# Patient Record
Sex: Male | Born: 1940 | Race: White | Hispanic: No | Smoking: Never smoker
Health system: Southern US, Community
[De-identification: ages and names within clinical notes are randomized; demographics above are authoritative.]

## PROBLEM LIST (undated history)

## (undated) DIAGNOSIS — H5440 Blindness, one eye, unspecified eye: Secondary | ICD-10-CM

## (undated) DIAGNOSIS — G459 Transient cerebral ischemic attack, unspecified: Secondary | ICD-10-CM

## (undated) DIAGNOSIS — I2510 Atherosclerotic heart disease of native coronary artery without angina pectoris: Secondary | ICD-10-CM

## (undated) DIAGNOSIS — N183 Chronic kidney disease, stage 3 unspecified: Secondary | ICD-10-CM

## (undated) DIAGNOSIS — E039 Hypothyroidism, unspecified: Secondary | ICD-10-CM

## (undated) DIAGNOSIS — K449 Diaphragmatic hernia without obstruction or gangrene: Secondary | ICD-10-CM

## (undated) DIAGNOSIS — M1990 Unspecified osteoarthritis, unspecified site: Secondary | ICD-10-CM

## (undated) DIAGNOSIS — K7460 Unspecified cirrhosis of liver: Secondary | ICD-10-CM

## (undated) DIAGNOSIS — I10 Essential (primary) hypertension: Secondary | ICD-10-CM

## (undated) DIAGNOSIS — F1020 Alcohol dependence, uncomplicated: Secondary | ICD-10-CM

## (undated) DIAGNOSIS — I81 Portal vein thrombosis: Secondary | ICD-10-CM

## (undated) DIAGNOSIS — D126 Benign neoplasm of colon, unspecified: Secondary | ICD-10-CM

## (undated) DIAGNOSIS — I4891 Unspecified atrial fibrillation: Secondary | ICD-10-CM

## (undated) HISTORY — DX: Chronic kidney disease, stage 3 unspecified: N18.30

## (undated) HISTORY — DX: Benign neoplasm of colon, unspecified: D12.6

## (undated) HISTORY — DX: Atherosclerotic heart disease of native coronary artery without angina pectoris: I25.10

## (undated) HISTORY — DX: Unspecified atrial fibrillation: I48.91

## (undated) HISTORY — DX: Chronic kidney disease, stage 3 (moderate): N18.3

## (undated) HISTORY — DX: Unspecified osteoarthritis, unspecified site: M19.90

## (undated) HISTORY — PX: EYE SURGERY: SHX253

## (undated) HISTORY — DX: Blindness, one eye, unspecified eye: H54.40

## (undated) HISTORY — PX: TOTAL KNEE ARTHROPLASTY: SHX125

## (undated) HISTORY — DX: Essential (primary) hypertension: I10

## (undated) HISTORY — PX: TOTAL HIP ARTHROPLASTY: SHX124

## (undated) HISTORY — PX: JOINT REPLACEMENT: SHX530

---

## 1995-11-20 DIAGNOSIS — H5440 Blindness, one eye, unspecified eye: Secondary | ICD-10-CM

## 1995-11-20 HISTORY — DX: Blindness, one eye, unspecified eye: H54.40

## 1995-11-20 HISTORY — PX: OTHER SURGICAL HISTORY: SHX169

## 1999-06-09 ENCOUNTER — Encounter: Payer: Self-pay | Admitting: Orthopedic Surgery

## 1999-06-09 ENCOUNTER — Ambulatory Visit (HOSPITAL_COMMUNITY): Admission: RE | Admit: 1999-06-09 | Discharge: 1999-06-09 | Payer: Self-pay | Admitting: Orthopedic Surgery

## 1999-10-20 ENCOUNTER — Ambulatory Visit (HOSPITAL_COMMUNITY): Admission: RE | Admit: 1999-10-20 | Discharge: 1999-10-20 | Payer: Self-pay | Admitting: Orthopedic Surgery

## 1999-10-20 ENCOUNTER — Encounter: Payer: Self-pay | Admitting: Orthopedic Surgery

## 1999-11-01 ENCOUNTER — Ambulatory Visit (HOSPITAL_COMMUNITY): Admission: RE | Admit: 1999-11-01 | Discharge: 1999-11-01 | Payer: Self-pay | Admitting: Unknown Physician Specialty

## 2000-11-19 HISTORY — PX: TOTAL HIP ARTHROPLASTY: SHX124

## 2001-11-19 HISTORY — PX: CARDIAC CATHETERIZATION: SHX172

## 2002-08-11 ENCOUNTER — Inpatient Hospital Stay (HOSPITAL_COMMUNITY): Admission: EM | Admit: 2002-08-11 | Discharge: 2002-08-14 | Payer: Self-pay | Admitting: Cardiology

## 2002-11-19 HISTORY — PX: COLONOSCOPY: SHX174

## 2003-04-22 ENCOUNTER — Ambulatory Visit (HOSPITAL_COMMUNITY): Admission: RE | Admit: 2003-04-22 | Discharge: 2003-04-22 | Payer: Self-pay | Admitting: Internal Medicine

## 2008-08-19 HISTORY — PX: COLONOSCOPY: SHX174

## 2008-08-26 ENCOUNTER — Ambulatory Visit (HOSPITAL_COMMUNITY): Admission: RE | Admit: 2008-08-26 | Discharge: 2008-08-26 | Payer: Self-pay | Admitting: Internal Medicine

## 2008-08-26 ENCOUNTER — Ambulatory Visit: Payer: Self-pay | Admitting: Internal Medicine

## 2011-04-03 NOTE — Op Note (Signed)
NAME:  STEVENS, MAGWOOD NO.:  [id number]   MEDICAL RECORD NO.:  [id number]          PATIENT TYPE:  AMB   LOCATION:  DAY                           FACILITY:  [facility]   PHYSICIAN:  R. Garfield Cornea, M.D. DATE OF BIRTH:  05-May-1941   DATE OF PROCEDURE:  08/26/2008  DATE OF DISCHARGE:                               OPERATIVE REPORT   INDICATIONS FOR PROCEDURE:  A 70 year old gentleman with history of  colonic polyps.  Last colonoscopy was in 2004.  He had adenoma at that  time.  He is devoid of any lower GI tract symptoms.  He is here for  surveillance.  Risks, benefits, alternatives, and limitations have been  reviewed, questions answered, and is agreeable.  Please see  documentation of medical record.   PROCEDURE NOTE:  O2 saturation, blood pressure, pulse, respirations  monitored throughout the entire procedure.   CONSCIOUS SEDATION:  Versed 5 mg IV, Demerol 100 mg IV in divided doses.   INSTRUMENT:  Pentax video chip system.   FINDINGS:  Digital rectal exam revealed no abnormalities.  Endoscopic  findings, the prep was good.  Colon:  Colonic mucosa was surveyed from  the rectosigmoid junction through the left transverse to right colon to  the appendiceal orifice, ileocecal valve, and cecum.  From this level,  scope was slowly withdrawn.  All previous mentioned mucosal surfaces  were again seen.  The colonic mucosa appeared normal.  Scope was pulled  out of rectum.  Thorough examination of the rectal mucosa including  retroflexion view of the anal verge demonstrated no abnormalities.  The  patient tolerated the procedure well and reactive at endoscopy.   IMPRESSION:  Normal rectum, normal colon.   RECOMMENDATIONS:  Repeat colonoscopy in 5-7 years.      Bridgette Habermann, M.D.  Electronically Signed     RMR/MEDQ  D:  08/26/2008  T:  08/26/2008  Job:  [id number]   cc:   Dr. Manuella Ghazi

## 2011-04-06 NOTE — Op Note (Signed)
NAME:  Justin Parsons, Justin Parsons                    ACCOUNT NO.:  [id number]   MEDICAL RECORD NO.:  [id number]                   PATIENT TYPE:  AMB   LOCATION:  DAY                                  FACILITY:  [facility]   PHYSICIAN:  R. Garfield Cornea, M.D.              DATE OF BIRTH:  Nov 04, 1941   DATE OF PROCEDURE:  04/22/2003  DATE OF DISCHARGE:                                 OPERATIVE REPORT   PROCEDURE:  Colonoscopy with snare polypectomy.   ENDOSCOPIST:  Cristopher Estimable. Rourk, M.D.   INDICATIONS FOR PROCEDURE:  The patient is a 70 year old gentlemen with  intermittent bright red blood per rectum which has been ongoing for years.  He has never had his lower GI tract evaluated.  Colonoscopy is now being  done. This approach has been discussed with the patient at length.  The  potential risks, benefits, and alternatives have been reviewed, questions  answered.  He is agreeable.  Please see my dictated consultation note for  more information (Apr 08, 2003).   PROCEDURE NOTE:  O2 saturation, blood pressure, pulse and respirations were  monitored throughout the entire procedure.  Conscious sedation: Versed 2  mg IV, Demerol 50 mg IV in divided doses.   INSTRUMENT:  Olympus video chip adult colonoscope.   FINDINGS:  Digital rectal exam revealed no abnormalities.   ENDOSCOPIC FINDINGS:  The prep was adequate.   RECTUM:  Examination of the rectal mucosa including the retroflex view of  the anal verge revealed a 1 cm sessile polyp seen at 6 cm from the anal  verge.  Please see photos.  The patient really did not have any hemorrhoids  or other abnormalities.   COLON:  The colonic mucosa was surveyed from the rectosigmoid junction  through the left transverse and right colon to the area of the appendiceal  orifice, ileocecal valve, and cecum.  These structures were well seen and  photographed for the record.  The colonic mucosa to the cecum appeared  normal.   From the level of the cecum and  ileocecal valve the scope was slowly and  cautiously withdrawn.  All previously mentioned mucosal surfaces were again  seen; and, again, no abnormalities were observed.  The polyp in the rectum  was resected with the snare, single pass, and was recovered.  The patient  tolerated the procedure well and was reacted in endoscopy.   IMPRESSION:  1. Sessile rectal polyp, as described above, resected with snare.  2. The remainder of the rectum and colon appeared normal.    RECOMMENDATIONS:  1. No aspirin or arthritis medications for 10 days.  2. Colace 100 mg orally b.i.d.  3. Metamucil or Citrucel daily.  4. Follow up on path.  5. Further recommendations to follow.  6. I suspect that the patient does, indeed, have a fissure.  I doubt that     the polyp in the rectum is responsible for his intermittent bright red  blood per rectum.                                               Bridgette Habermann, M.D.    RMR/MEDQ  D:  04/22/2003  T:  04/22/2003  Job:  [id number]   cc:   Jeanie Cooks  [address] [address]  Fax: [phone]

## 2011-04-06 NOTE — Discharge Summary (Signed)
NAME:  Justin Parsons, Justin Parsons                    ACCOUNT NO.:  [id number]   MEDICAL RECORD NO.:  [id number]                   PATIENT TYPE:  INP   LOCATION:  2005                                 FACILITY:  [facility]   PHYSICIAN:  Octavia Heir, M.D.             DATE OF BIRTH:  December 24, 1940   DATE OF ADMISSION:  08/11/2002  DATE OF DISCHARGE:  08/14/2002                                 DISCHARGE SUMMARY   DISCHARGE DIAGNOSES:  1. 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  2. Hypertension, controlled at discharge.  3. Arthritis.  4. Hyperlipidemia.   HISTORY OF PRESENT ILLNESS:  The patient is a 70 year old male who presented  to [facility] with chest pain. Enzymes came back positive with a CK  of 532 and MB of 69. He was transferred to [facility] with a  diagnosis of a non Q wave MI.  He has a history of a prior catheterization  in December 2000 that showed nonobstructive disease and an ejection fraction  of 50%. Cardiolite study done on December 2002 showed an EF of 51% with some  scar and mild peri-infarct ischemia.   HOSPITAL COURSE:  The patient was admitted to telemetry and started on IV  heparin. He was  catheterized on August 11, 2002, by Dr. Einar Gip. This  revealed an ectatic large RCA, 50% narrowing of the  first diagonal. The  circumflex was also large. There was no significant change from his previous  Cardiolite study, and his EF had improved from 50%  to 65%. The patient was  diagnosed with presumed spasm and possible thrombosis in the RCA. He was  started on a calcium blocker, a statin and low dose beta blocker. The  patient had been on no medications prior to admission; he had run out.   LABORATORY DATA:  Sodium 137, potassium 3.5, BUN 19, creatinine 1.5. White  count 10.3, hemoglobin 13.7,  hematocrit 40.4, platelets 136. Chemistry  showed normal liver functions except for an  elevated  AST of 74. CKs peaked  here at 452 with 48 MBs. A lipid profile showed a cholesterol of 198, LDL  127, HDL 45.   An EKG showed sinus rhythm with inferior Qs.   DISPOSITION:  The patient is discharged in stable condition.  He would like  to follow up with Dr. Mathis Bud in our [place] office, as he lives in  [place]. At discharge he noted that he had no money for medicines. It was  arranged for him to come to the office  to pick up two  weeks worth of  samples. This will need to be  addressed when he sees Dr. Mathis Bud  in  followup.     Erlene Quan, P.A.                      Octavia Heir,  M.D.    LKK/MEDQ  D:  08/14/2002  T:  08/17/2002  Job:  [id number]   cc:   Roque Lias, M.D.   Ninetta Lights, M.D.

## 2011-04-06 NOTE — Cardiovascular Report (Signed)
NAME:  Justin Parsons, Justin Parsons                    ACCOUNT NO.:  [id number]   MEDICAL RECORD NO.:  [id number]                   PATIENT TYPE:  INP   LOCATION:                                       FACILITY:  [facility]   PHYSICIAN:  Eden Lathe. Einar Gip, M.D.                  DATE OF BIRTH:  Mar 18, 1941   DATE OF PROCEDURE:  08/12/2002  DATE OF DISCHARGE:                              CARDIAC CATHETERIZATION   PATIENT LOCATION:  2900   PROCEDURES PERFORMED:  1. Left ventriculography.  2. Selective right and left coronary arteriography.  3. Closure of right femoral artery access with Perclose.   INDICATION:  The patient is a 70 year old gentleman with a history of  hypertension, history of insignificant coronary artery disease in the past  was admitted to the hospital with non-Q-wave myocardial infarction. A  cardiac catheterization is being performed to evaluate his coronary anatomy.   HEMODYNAMIC DATA:  The left ventricular pressures 129/-4 with an end-  diastolic pressure of 20 mmHg. The aortic pressure 121/65 with a mean of 95  mmHg.   ANGIOGRAPHIC DATA:   LEFT VENTRICULOGRAM:  The left ventricular systolic function was normal and  is estimated at 65%.  There is no wall motion abnormality.  There is no  significant mitral regurgitation.   Right coronary artery:  The right coronary artery is a large dominant  vessel.  The PDA supplies the whole inferior wall, posterior wall, and  majority of lateral wall and the apical wall.  The right coronary artery is  severely ectatic throughout. The PLV branch is a moderate caliber vessel and  proximally has about 30% stenosis.  Otherwise, no significant lesion is  noted and there is diffuse slow filling in the right coronary artery.   Left main coronary artery:  The left main coronary artery is a large caliber  vessel and is normal.   Left circumflex:  Left circumflex is again a very large caliber vessel and  is diffusely ectatic. It gives origin  to a very large obtuse marginal branch  #1, obtuse marginal #2 and the large obtuse marginal #3.  There is no  significant luminal narrowing.   Left anterior descending artery:  Left anterior descending artery is a large  caliber vessel proximally and it ends before reaching the apex.  The  proximal LAD has about 40-50% stenosis. It gives origin to a moderate sized  diagonal #1, which has a ostial and proximal 50% stenosis. Post stenosis,  there is a PCI noted in the diagonal #1 branch of the left anterior  descending artery.   IMPRESSION:  1. Severely ectatic right coronary artery, more than the circumflex coronary     artery. No significant luminal narrowing.  2. No significant change in coronary anatomy compared to November 01, 1999,     except ejection fraction is improved from 50% to the percent 65%.   RECOMMENDATIONS:  Based on  his coronary anatomy, continued aggressive risk  factor modification is indicated. The patient will be started on aspirin and  Plavix for prevention of clot formation given his severely ectatic coronary  arteries. Weight loss and calorie restriction is indicated.   TECHNIQUE OF PROCEDURE:  Under the usual sterile precautions, using 6 French  right femoral artery access, a 6 [place] multipurpose B2 catheter was  advanced into the ascending aorta over a 0.035 inch J wire.  The catheter  was gently advanced in the left ventricle and left ventricular pressures  were monitored. Hand contrast injection of the left ventricular was  performed, both in LAO and RAO projection. Then the catheter flushed and  pulled back into the ascending aorta and pressure gradient across gradient  across the aortic valve was monitored.  The right coronary artery was  selectively engaged and angiography was performed.  Then the catheter was  pulled out of the body and a 6 [place] Judkins 5 diagnostic catheter was  advanced into the ascending aorta over a 0.035 inch J wire. The left  main  coronary artery was selectively engaged. Angiography was performed. Then the  catheter was pulled out of the body in the usual fashion and changed to a 6  French angled pigtail catheter. The catheter was gently advanced in the left  ventricle and left ventriculography was performed in the RAO projection.  Then the catheter was flushed and pulled back into the ascending aorta.  Pressure gradient across the aortic valve was monitored.  Then the catheter  was pulled out of the body.  Right femoral angiography was performed through  the arterial access sheath and the access was closed with Perclose and  adequate hemostasis obtained.  The patient was transferred to recovery in a  stable condition. The patient tolerated the procedure well.                                                 Eden Lathe. Einar Gip, M.D.    Minna Antis  D:  08/12/2002  T:  08/14/2002  Job:  [id number]

## 2011-05-01 ENCOUNTER — Encounter: Payer: Self-pay | Admitting: Gastroenterology

## 2011-05-01 ENCOUNTER — Ambulatory Visit (INDEPENDENT_AMBULATORY_CARE_PROVIDER_SITE_OTHER): Payer: Medicare Other | Admitting: Gastroenterology

## 2011-05-01 DIAGNOSIS — D649 Anemia, unspecified: Secondary | ICD-10-CM

## 2011-05-01 DIAGNOSIS — K219 Gastro-esophageal reflux disease without esophagitis: Secondary | ICD-10-CM

## 2011-05-01 DIAGNOSIS — K625 Hemorrhage of anus and rectum: Secondary | ICD-10-CM | POA: Insufficient documentation

## 2011-05-01 DIAGNOSIS — N289 Disorder of kidney and ureter, unspecified: Secondary | ICD-10-CM

## 2011-05-01 LAB — CBC
Hemoglobin: 13.2 g/dL — AB (ref 13.5–17.5)
WBC: 10.1

## 2011-05-01 LAB — COMPREHENSIVE METABOLIC PANEL
Albumin: 7.4
BUN: 27 mg/dL — AB (ref 4–21)
Calcium: 9.2 mg/dL
Creat: 1.87
Glucose: 100 mg/dL
Iron Saturation: 18
Iron: 74
Potassium: 4.4 mmol/L
TIBC: 410
Total bilirubin, fluid: 20

## 2011-05-01 MED ORDER — OMEPRAZOLE 20 MG PO CPDR: 20.0000 mg | DELAYED_RELEASE_CAPSULE | Freq: Every day | ORAL | Status: DC

## 2011-05-01 NOTE — Assessment & Plan Note (Addendum)
Patient states he has a history of anemia. Occasional diarrhea blood per rectum on the toilet tissue. Colonoscopy is up-to-date. He has nocturnal heartburn symptoms when he eats too late but otherwise no significant GI symptoms. He is on chronic diclofenac. As discussed with him today will start him on omeprazole 20 mg daily. Will try to retrieve blood work done by Dr. Manuella Ghazi. Repeat labs today. ifobt. Further recommendations to follow.

## 2011-05-01 NOTE — Assessment & Plan Note (Signed)
Check Met-7.

## 2011-05-01 NOTE — Assessment & Plan Note (Signed)
No alarm symptoms. Start omeprazole.

## 2011-05-01 NOTE — Progress Notes (Signed)
Cc to PCP 

## 2011-05-01 NOTE — Progress Notes (Signed)
Primary Care Physician:  Monico Blitz, MD  Primary Gastroenterologist:  Garfield Cornea, MD  Chief Complaint  Patient presents with  . Rectal Bleeding  . Anemia  . Chronic Kidney Disease    HPI:  Justin Parsons is a 70 y.o. male here for further evaluation of anemia blood in stool. He made the appointment with these complaints but states he asked limited because he needed someone to assist him with finding out some answers. He states that in January he had revision of a prior hip replacement and at that time was told that he had abnormal kidney function, hyperkalemia, anemia. He states he has not been able to find any answers from Dr. Manuella Ghazi and wants to be considered for referral for his kidney function problems. We last saw him in 2009 at time of colonoscopy which was normal. He has a history of adenomatous polyps was recommended to come back for repeat exam in 5-7 years (2014-2016).  He complains of mouth stays dry. Not thirsty. Mouth sticks together after sleeping. When he found out he had kidney problems he was taken off of blood pressure by mouth and they wanted to take him off his diclofenac but he states he can't get out of bed without it.    Stools dark. H/O fissure, will notice brbpr on toilet tissue at times. BM regular. Takes iron, started since d/c from hospital in January 2012.  Nocturnal heartburn if eats late at night. Takes OTC antacids. No abd pain. No dysphagia to solid foods or pills.  Current Outpatient Prescriptions  Medication Sig Dispense Refill  . allopurinol (ZYLOPRIM) 100 MG tablet Take 100 mg by mouth daily.        Marland Kitchen amLODipine (NORVASC) 2.5 MG tablet Take 2.5 mg by mouth daily.        . diclofenac (VOLTAREN) 50 MG EC tablet Take 50 mg by mouth 3 (three) times daily as needed.        . iron polysaccharides (NIFEREX) 150 MG capsule Take 150 mg by mouth daily.       Marland Kitchen omeprazole (PRILOSEC) 20 MG capsule Take 1 capsule (20 mg total) by mouth daily.  30 capsule  5       Allergies as of 05/01/2011  . (No Known Allergies)    Past Medical History  Diagnosis Date  . HTN (hypertension)   . Arthritis   . 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 Adenomatous polyp of colon     Past Surgical History  Procedure Date  . Total hip arthroplasty 2002  . 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 Colonoscopy 08/2008    normal, repeat exam in 5-7 years  . Colonoscopy 2004    rectal adenomatous polyp  . 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 Colon cancer Neg Hx   . Ovarian cancer Sister     History   Social History  . Marital Status: Divorced    Spouse Name: N/A    Number of Children: N/A  . Years of Education: N/A   Occupational History  . Not on file.   Social History Main Topics  . Smoking status: Never Smoker   . Smokeless tobacco: Not on file   Comment: used to chew tobacco, none in 15 years  . Alcohol Use: Yes  half a gallon in a week but nothing in about a month or so  . Drug Use: No  . Sexually Active: Not on file   Other Topics Concern  . Not on file   Social History Narrative   One son deceased while in prison, drug addiction.      ROS:  General: Negative for anorexia, weight loss, fever, chills, fatigue, weakness. Eyes: Negative for vision changes.  ENT: Negative for hoarseness, difficulty swallowing , nasal congestion. CV: Negative for chest pain, angina, palpitations, dyspnea on exertion, peripheral edema.  Respiratory: Negative for dyspnea at rest, dyspnea on exertion, cough, sputum, wheezing.  GI: See history of present illness. GU:  Negative for dysuria, hematuria, urinary incontinence, urinary frequency, nocturnal urination.  MS: Negative for joint pain, low back pain.  Derm: Negative for rash or itching.  Neuro: Negative for weakness, abnormal sensation, seizure, frequent  headaches, memory loss, confusion.  Psych: Negative for anxiety, depression, suicidal ideation, hallucinations.  Endo: Negative for unusual weight change.  Heme: Negative for bruising or bleeding. Allergy: Negative for rash or hives.    Physical Examination:  BP 118/69  Pulse 61  Temp(Src) 97.3 F (36.3 C) (Temporal)  Ht 5' 9"  (1.753 m)  Wt 295 lb (133.811 kg)  BMI 43.56 kg/m2   General: Well-nourished, well-developed in no acute distress.  Head: Normocephalic, atraumatic.   Eyes: Conjunctiva pink, no icterus. Mouth: Oropharyngeal mucosa moist and pink , no lesions erythema or exudate. Neck: Supple without thyromegaly, masses, or lymphadenopathy.  Lungs: Clear to auscultation bilaterally.  Heart: Regular rate and rhythm, no murmurs rubs or gallops.  Abdomen: Bowel sounds are normal, obese, nontender, nondistended, no hepatosplenomegaly or masses, no abdominal bruits or    hernia , no rebound or guarding.   Extremities: No lower extremity edema.  Neuro: Alert and oriented x 4 , grossly normal neurologically.  Skin: Warm and dry, no rash or jaundice.   Psych: Alert and cooperative, normal mood and affect.

## 2011-05-02 LAB — CBC WITH DIFFERENTIAL/PLATELET
Basophils Absolute: 0 10*3/uL (ref 0.0–0.1)
Basophils Relative: 0 % (ref 0–1)
Eosinophils Absolute: 0.5 10*3/uL (ref 0.0–0.7)
Eosinophils Relative: 5 % (ref 0–5)
HCT: 42.7 % (ref 39.0–52.0)
Hemoglobin: 13.9 g/dL (ref 13.0–17.0)
Lymphocytes Relative: 18 % (ref 12–46)
Lymphs Abs: 1.7 10*3/uL (ref 0.7–4.0)
MCH: 30.8 pg (ref 26.0–34.0)
MCHC: 32.6 g/dL (ref 30.0–36.0)
MCV: 94.7 fL (ref 78.0–100.0)
Monocytes Absolute: 0.7 10*3/uL (ref 0.1–1.0)
Monocytes Relative: 7 % (ref 3–12)
Neutro Abs: 6.6 10*3/uL (ref 1.7–7.7)
Neutrophils Relative %: 70 % (ref 43–77)
Platelets: 161 10*3/uL (ref 150–400)
RBC: 4.51 MIL/uL (ref 4.22–5.81)
RDW: 15.2 % (ref 11.5–15.5)
WBC: 9.5 10*3/uL (ref 4.0–10.5)

## 2011-05-02 LAB — BASIC METABOLIC PANEL
BUN: 21 mg/dL (ref 6–23)
CO2: 26 mEq/L (ref 19–32)
Calcium: 9.2 mg/dL (ref 8.4–10.5)
Chloride: 106 mEq/L (ref 96–112)
Creat: 1.56 mg/dL — ABNORMAL HIGH (ref 0.50–1.35)
Glucose, Bld: 89 mg/dL (ref 70–99)
Potassium: 4.4 mEq/L (ref 3.5–5.3)
Sodium: 143 mEq/L (ref 135–145)

## 2011-05-02 LAB — IRON AND TIBC
%SAT: 33 % (ref 20–55)
Iron: 115 ug/dL (ref 42–165)
TIBC: 347 ug/dL (ref 215–435)
UIBC: 232 ug/dL

## 2011-05-02 LAB — FERRITIN: Ferritin: 59 ng/mL (ref 22–322)

## 2011-05-10 ENCOUNTER — Telehealth: Payer: Self-pay

## 2011-05-10 NOTE — Telephone Encounter (Signed)
Pt called- I had sent him a letter about his labs. Went over lab work with him and he stated he would try to get ifobt back to [place] next week. 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

## 2011-05-21 ENCOUNTER — Ambulatory Visit: Payer: Medicare Other | Admitting: Gastroenterology

## 2011-05-21 DIAGNOSIS — D649 Anemia, unspecified: Secondary | ICD-10-CM

## 2011-05-24 ENCOUNTER — Other Ambulatory Visit: Payer: Self-pay | Admitting: Gastroenterology

## 2011-05-24 DIAGNOSIS — D649 Anemia, unspecified: Secondary | ICD-10-CM

## 2011-05-24 NOTE — Telephone Encounter (Signed)
Pt returned ifobt and it was negative. Pt aware

## 2011-10-04 ENCOUNTER — Other Ambulatory Visit: Payer: Self-pay | Admitting: Gastroenterology

## 2011-10-05 LAB — CBC WITH DIFFERENTIAL/PLATELET
Basophils Relative: 0 % (ref 0–1)
Eosinophils Absolute: 0.4 10*3/uL (ref 0.0–0.7)
HCT: 48.1 % (ref 39.0–52.0)
Hemoglobin: 16 g/dL (ref 13.0–17.0)
Lymphs Abs: 1.4 10*3/uL (ref 0.7–4.0)
MCH: 32.5 pg (ref 26.0–34.0)
MCHC: 33.3 g/dL (ref 30.0–36.0)
MCV: 97.6 fL (ref 78.0–100.0)
Monocytes Absolute: 0.8 10*3/uL (ref 0.1–1.0)
Monocytes Relative: 7 % (ref 3–12)
Neutrophils Relative %: 78 % — ABNORMAL HIGH (ref 43–77)
RBC: 4.93 MIL/uL (ref 4.22–5.81)

## 2011-10-05 NOTE — Progress Notes (Signed)
Quick Note:  H/H normal. Platelet down some, wbc count up some.  Please see how patient is doing. Any fever, congestion, sob, abd pain, bowel issues, urinary burning. Repeat CBC in four weeks.   ______

## 2011-12-19 ENCOUNTER — Other Ambulatory Visit: Payer: Self-pay | Admitting: Gastroenterology

## 2011-12-19 DIAGNOSIS — D649 Anemia, unspecified: Secondary | ICD-10-CM

## 2011-12-24 ENCOUNTER — Other Ambulatory Visit: Payer: Self-pay | Admitting: Gastroenterology

## 2011-12-25 LAB — CBC WITH DIFFERENTIAL/PLATELET
Eosinophils Absolute: 0.4 10*3/uL (ref 0.0–0.7)
Eosinophils Relative: 4 % (ref 0–5)
HCT: 46.2 % (ref 39.0–52.0)
Lymphocytes Relative: 16 % (ref 12–46)
Lymphs Abs: 1.6 10*3/uL (ref 0.7–4.0)
MCH: 32.1 pg (ref 26.0–34.0)
MCV: 97.7 fL (ref 78.0–100.0)
Monocytes Absolute: 0.7 10*3/uL (ref 0.1–1.0)
Platelets: 141 10*3/uL — ABNORMAL LOW (ref 150–400)
RDW: 14 % (ref 11.5–15.5)
WBC: 10 10*3/uL (ref 4.0–10.5)

## 2011-12-27 NOTE — Progress Notes (Signed)
Quick Note:  Looks good except mild thrombocytopenia. Limit etoh consumption, preferably none. Consider abd u/s to look for cirrhosis/splenomegaly if patient agreeable. ______

## 2012-01-02 NOTE — Progress Notes (Signed)
Quick Note:  Tried to call pt- LMOM ______

## 2012-01-07 NOTE — Progress Notes (Signed)
Quick Note:  Tried to call pt- LMOM ______

## 2012-01-09 NOTE — Progress Notes (Signed)
Quick Note:  Mailed letter to pt ______

## 2012-01-14 ENCOUNTER — Other Ambulatory Visit: Payer: Self-pay | Admitting: General Practice

## 2012-01-14 DIAGNOSIS — F1010 Alcohol abuse, uncomplicated: Secondary | ICD-10-CM

## 2012-01-14 NOTE — Progress Notes (Signed)
Pt is scheduled for abd u/s on 01/21/12@8 :00am.  Appt is given to [phone]).

## 2012-01-18 DIAGNOSIS — I81 Portal vein thrombosis: Secondary | ICD-10-CM

## 2012-01-18 HISTORY — DX: Portal vein thrombosis: I81

## 2012-01-21 ENCOUNTER — Other Ambulatory Visit (HOSPITAL_COMMUNITY): Payer: PRIVATE HEALTH INSURANCE

## 2012-01-21 ENCOUNTER — Ambulatory Visit (HOSPITAL_COMMUNITY)
Admission: RE | Admit: 2012-01-21 | Discharge: 2012-01-21 | Disposition: A | Discharge status: Home / Self Care | Payer: Medicare Other | Source: Ambulatory Visit | Attending: Gastroenterology | Admitting: Gastroenterology

## 2012-01-21 DIAGNOSIS — K838 Other specified diseases of biliary tract: Secondary | ICD-10-CM | POA: Insufficient documentation

## 2012-01-21 DIAGNOSIS — K769 Liver disease, unspecified: Secondary | ICD-10-CM | POA: Insufficient documentation

## 2012-01-21 DIAGNOSIS — F1010 Alcohol abuse, uncomplicated: Secondary | ICD-10-CM

## 2012-01-22 NOTE — Progress Notes (Signed)
Results faxed to PCP

## 2012-01-22 NOTE — Progress Notes (Signed)
Quick Note:  Patient needs OV this week to discuss u/s results. 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  To discuss with Dr. Gala Romney. ______

## 2012-01-24 NOTE — Progress Notes (Signed)
Results Cc to PCP  

## 2012-01-24 NOTE — Progress Notes (Signed)
Quick Note:  Spoke with Dr. Gala Romney regarding u/s. No need for anticoagulation for portal vein thrombosis in cirrhotics.  Need OV to discuss findings, further evaluate cause of cirrhosis,baseline AFP, consider EGD for EV, consider CT abd/pelvis with contrast for further detailing of liver and portal vein thrombosis.  Will forward to Vicente Males to make aware since appt on Monday with her. ______

## 2012-01-24 NOTE — Progress Notes (Signed)
Quick Note:  When is his OV? ______

## 2012-01-28 ENCOUNTER — Encounter: Payer: Self-pay | Admitting: Gastroenterology

## 2012-01-28 ENCOUNTER — Ambulatory Visit (INDEPENDENT_AMBULATORY_CARE_PROVIDER_SITE_OTHER): Payer: Medicare Other | Admitting: Gastroenterology

## 2012-01-28 VITALS — BP 108/67 | HR 59 | Temp 98.0°F | Ht 69.0 in | Wt 305.6 lb

## 2012-01-28 DIAGNOSIS — K7460 Unspecified cirrhosis of liver: Secondary | ICD-10-CM

## 2012-01-28 DIAGNOSIS — K219 Gastro-esophageal reflux disease without esophagitis: Secondary | ICD-10-CM

## 2012-01-28 DIAGNOSIS — D369 Benign neoplasm, unspecified site: Secondary | ICD-10-CM

## 2012-01-28 NOTE — Progress Notes (Signed)
Referring Provider: Monico Blitz, MD Primary Care Physician:  Monico Blitz, MD, MD Primary Gastroenterologist: Dr. Gala Romney   Chief Complaint  Patient presents with  . Results    HPI:   Mr. Houseman presents today in follow-up for new findings of cirrhosis. He was last seen in June 2012 in our office. Since then, serial CBCs have shown consistent mild thrombocytopenia. [place] of abdomen ordered to assess for possible cirrhosis. Interestingly, [place] in Feb 2013 showed cirrhosis, portal vein thrombosis, gallbladder sludge, no evidence of acute cholecystitis, atrophic right kidney. 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 He will likely need further imaging to evaluate these findings more extensively. An MRI was proposed by radiologist; however, I believe CT may be best due to hx of eye transplant, knee replacement, etc.  Pt presents today for review of results. He was unaware of findings and concerned he was going to die. Discussed findings with him. He is relieved. Does note chronic hx of ETOH, specifically moonshine. He has not had any to drink since the [place] , as he states the tech scared him. Denies any tattoos, IV drug abuse, did have blood transfusion but in 1997. Denies jaundice, pruritis, wt loss, lack of appetite, dysphagia. No mental status changes or confusion. Reflux significantly improved since alcohol cessation.   Past Medical History  Diagnosis Date  . HTN (hypertension)   . Arthritis   . 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 Adenomatous polyp of colon     Past Surgical History  Procedure Date  . Total hip arthroplasty 2002  . 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 Colonoscopy 08/2008    normal, repeat exam in 5-7 years  . Colonoscopy 2004    rectal adenomatous polyp  . Total hip arthroplasty 2006/2012    revision in 2012    Current Outpatient Prescriptions    Medication Sig Dispense Refill  . allopurinol (ZYLOPRIM) 100 MG tablet Take 100 mg by mouth daily.        Marland Kitchen amLODipine (NORVASC) 2.5 MG tablet Take 2.5 mg by mouth daily.        . diclofenac (VOLTAREN) 50 MG EC tablet Take 50 mg by mouth 3 (three) times daily as needed.        . iron polysaccharides (NIFEREX) 150 MG capsule Take 150 mg by mouth daily.       Marland Kitchen omeprazole (PRILOSEC) 20 MG capsule Take 1 capsule (20 mg total) by mouth daily.  30 capsule  5    Allergies as of 01/28/2012  . (No Known Allergies)    Family History  Problem Relation Age of Onset  . Colon cancer Neg Hx   . Ovarian cancer Sister     History   Social History  . Marital Status: Divorced    Spouse Name: N/A    Number of Children: N/A  . Years of Education: N/A   Social History Main Topics  . Smoking status: Never Smoker   . Smokeless tobacco: None   Comment: used to chew tobacco, none in 15 years  . Alcohol Use: Yes     half a gallon in a week but nothing in about a month or so  . Drug Use: No  . Sexually Active: None   Other Topics Concern  . None   Social History Narrative   One son deceased while in prison, drug addiction.  Review of Systems: Gen: Denies fever, chills, anorexia. Denies fatigue, weakness, weight loss.  CV: Denies chest pain, palpitations, syncope, peripheral edema, and claudication. Resp: Denies dyspnea at rest, cough, wheezing, coughing up blood, and pleurisy. GI: Denies vomiting blood, jaundice, and fecal incontinence.   Denies dysphagia or odynophagia. Derm: Denies rash, itching, dry skin Psych: Denies depression, anxiety, memory loss, confusion. No homicidal or suicidal ideation.  Heme: Denies bruising, bleeding, and enlarged lymph nodes.  Physical Exam: BP 108/67  Pulse 59  Temp(Src) 98 F (36.7 C) (Temporal)  Ht 5' 9"  (1.753 m)  Wt 305 lb 9.6 oz (138.619 kg)  BMI 45.13 kg/m2 General:   Alert and oriented. No distress noted. Pleasant and cooperative.  Head:   Normocephalic and atraumatic. Eyes:  Conjuctiva clear without scleral icterus, right eye prosthesis Mouth:  Oral mucosa pink and moist.  Neck:  Supple, without mass or thyromegaly. Heart:  S1, S2 present without murmurs, rubs, or gallops. Regular rate and rhythm. Abdomen:  +BS, soft, largely obese, difficult to appreciate HSM. non-tender and non-distended. No rebound or guarding. Msk:  Symmetrical without gross deformities. Normal posture. Extremities:  Without edema. Neurologic:  Alert and  oriented x4;  grossly normal neurologically. Skin:  Intact without significant lesions or rashes. Cervical Nodes:  No significant cervical adenopathy. Psych:  Alert and cooperative. Normal mood and affect.

## 2012-01-28 NOTE — Patient Instructions (Signed)
Please have blood work completed. We will be calling you with these results.  We have also set you up for an upper endoscopy with Dr. Gala Romney in the near future.   Review the low salt diet and the handout on cirrhosis. It is important that you follow the diet that we provide you to help protect your liver.   We will be in touch shortly with the next best test for you regarding further evaluation of your liver.   Cirrhosis Cirrhosis is a condition of scarring of the liver which is caused when the liver has tried repairing itself following damage. This damage may come from a previous infection such as one of the forms of hepatitis (usually hepatitis C), or the damage may come from being injured by toxins. The main toxin that causes this damage is alcohol. The scarring of the liver from use of alcohol is irreversible. That means the liver cannot return to normal even though alcohol is not used any more. The main danger of hepatitis C infection is that it may cause long-lasting (chronic) liver disease, and this also may lead to cirrhosis. This complication is progressive and irreversible. CAUSES   Prior to available blood tests, hepatitis C could be contracted by blood transfusions. Since testing of blood has improved, this is now unlikely. This infection can also be contracted through intravenous drug use and the sharing of needles. It can also be contracted through sexual relationships. The injury caused by alcohol comes from too much use. It is not a few drinks that poison the liver, but years of misuse. Usually there will be some signs and symptoms early with scarring of the liver that suggest the development of better habits. Alcohol should never be used while using acetaminophen. A small dose of both taken together may cause irreversible damage to the liver. HOME CARE INSTRUCTIONS   There is no specific treatment for cirrhosis. However, there are things you can do to avoid making the condition  worse.  Rest as needed.   Eat a well-balanced diet. Your caregiver can help you with suggestions.   Vitamin supplements including vitamins A, K, D, and thiamine can help.   A low-salt diet, water restriction, or diuretic medicine may be needed to reduce fluid retention.   Avoid alcohol. This can be extremely toxic if combined with acetaminophen.   Avoid drugs which are toxic to the liver. Some of these include isoniazid, methyldopa, acetaminophen, anabolic steroids (muscle-building drugs), erythromycin, and oral contraceptives (birth control pills). Check with your caregiver to make sure medicines you are presently taking will not be harmful.   Periodic blood tests may be required. Follow your caregiver's advice regarding the timing of these.   Milk thistle is an herbal remedy which does protect the liver against toxins. However, it will not help once the liver has been scarred.  SEEK MEDICAL CARE IF:  You have increasing fatigue or weakness.   You develop swelling of the hands, feet, legs, or face.   You vomit bright red blood, or a coffee ground appearing material.   You have blood in your stools, or the stools turn black and tarry.   You have a fever.   You develop loss of appetite, or have nausea and vomiting.   You develop jaundice.   You develop easy bruising or bleeding.   You have worsening of any of the problems you are concerned about.  Document Released: 11/05/2005 Document Revised: 10/25/2011 Document Reviewed: 06/23/2008 [facility] Patient Information 2012 [place],  [employer].

## 2012-01-29 DIAGNOSIS — K7460 Unspecified cirrhosis of liver: Secondary | ICD-10-CM | POA: Insufficient documentation

## 2012-01-29 DIAGNOSIS — D369 Benign neoplasm, unspecified site: Secondary | ICD-10-CM | POA: Insufficient documentation

## 2012-01-29 LAB — COMPREHENSIVE METABOLIC PANEL
Alkaline Phosphatase: 65 U/L (ref 39–117)
CO2: 21 mEq/L (ref 19–32)
Creat: 1.87 mg/dL — ABNORMAL HIGH (ref 0.50–1.35)
Glucose, Bld: 94 mg/dL (ref 70–99)
Sodium: 141 mEq/L (ref 135–145)
Total Bilirubin: 0.5 mg/dL (ref 0.3–1.2)
Total Protein: 6.5 g/dL (ref 6.0–8.3)

## 2012-01-29 LAB — AFP TUMOR MARKER: AFP-Tumor Marker: 2.3 ng/mL (ref 0.0–8.0)

## 2012-01-29 LAB — HEPATITIS PANEL, ACUTE
Hep A IgM: NEGATIVE
Hep B C IgM: NEGATIVE
Hepatitis B Surface Ag: NEGATIVE

## 2012-01-29 LAB — PROTIME-INR
INR: 1.05 (ref <1.50)
Prothrombin Time: 14.1 seconds (ref 11.6–15.2)

## 2012-01-29 NOTE — Assessment & Plan Note (Signed)
71 year old male with new finding of cirrhosis on [place], ordered due to mild thrombocytopenia. Noted portal vein thrombosis, not a candidate for anticoagulation due to cirrhotic status. Needs further imaging in near future to further characterize this. Unclear etiology of cirrhosis at this time, although it is most likely due to chronic ETOH abuse and possibly NASH. Pt is well-compensated at this time. I spent at least 30 minutes discussing the implications of this diagnosis. He states understanding about ETOH cessation, low sodium diet, HCC screenings, close monitoring, etc. We also discussed need for screening of esophageal varices. He states understanding.    1.We will facilitate sedation with Phenergan 12.5 mg. Proceed with upper endoscopy in the near future with Dr. Gala Romney. The risks, benefits, and alternatives have been discussed in detail with patient. They have stated understanding and desire to proceed.  2. Labs to include AFP, PT/INR, CMP, hepatitis panel, HFP (CBC feb 2013 on file) 3. 2g Na diet 4. Cirrhosis handout provided 5. Discuss with Radiologist best means of investigating portal vein thrombosis in future (anticipate CT over MRI due to prosthetics, knee replacement but will verify this with them).

## 2012-01-29 NOTE — Assessment & Plan Note (Signed)
Significantly improved since ETOH cessation a few days ago. Continue Prilosec.

## 2012-01-29 NOTE — Assessment & Plan Note (Signed)
Due for surveillance Oct 2014. No concerning features currently.

## 2012-01-30 NOTE — Progress Notes (Signed)
Faxed to PCP

## 2012-01-31 NOTE — Progress Notes (Signed)
Quick Note:  Negative Hepatitis panel.  AFP good. INR good.  BUN/Cr are elevated. I'm not sure if pt has hx of renal issues, or on diuretics. Didn't see in his med list. This needs to be sent to his PCP. Please make pt aware, and have him make an appt with his PCP to review these results.   ______

## 2012-02-05 ENCOUNTER — Encounter (HOSPITAL_COMMUNITY): Payer: Self-pay | Admitting: Pharmacy Technician

## 2012-02-08 MED ORDER — SODIUM CHLORIDE 0.45 % IV SOLN
Freq: Once | INTRAVENOUS | Status: AC
  Administered 2012-02-11: 11:00:00 via INTRAVENOUS

## 2012-02-11 ENCOUNTER — Encounter (HOSPITAL_COMMUNITY): Payer: Self-pay | Admitting: *Deleted

## 2012-02-11 ENCOUNTER — Ambulatory Visit (HOSPITAL_COMMUNITY)
Admission: RE | Admit: 2012-02-11 | Discharge: 2012-02-11 | Disposition: A | Discharge status: Home / Self Care | Payer: Medicare Other | Source: Ambulatory Visit | Attending: Internal Medicine | Admitting: Internal Medicine

## 2012-02-11 ENCOUNTER — Encounter (HOSPITAL_COMMUNITY)
Admission: RE | Disposition: A | Discharge status: Home / Self Care | Payer: Self-pay | Source: Ambulatory Visit | Attending: Internal Medicine

## 2012-02-11 DIAGNOSIS — K7460 Unspecified cirrhosis of liver: Secondary | ICD-10-CM

## 2012-02-11 DIAGNOSIS — K319 Disease of stomach and duodenum, unspecified: Secondary | ICD-10-CM | POA: Insufficient documentation

## 2012-02-11 DIAGNOSIS — K2210 Ulcer of esophagus without bleeding: Secondary | ICD-10-CM | POA: Insufficient documentation

## 2012-02-11 DIAGNOSIS — Z79899 Other long term (current) drug therapy: Secondary | ICD-10-CM | POA: Insufficient documentation

## 2012-02-11 DIAGNOSIS — K2960 Other gastritis without bleeding: Secondary | ICD-10-CM

## 2012-02-11 DIAGNOSIS — K449 Diaphragmatic hernia without obstruction or gangrene: Secondary | ICD-10-CM | POA: Insufficient documentation

## 2012-02-11 DIAGNOSIS — I10 Essential (primary) hypertension: Secondary | ICD-10-CM | POA: Insufficient documentation

## 2012-02-11 DIAGNOSIS — K7030 Alcoholic cirrhosis of liver without ascites: Secondary | ICD-10-CM | POA: Insufficient documentation

## 2012-02-11 DIAGNOSIS — K2940 Chronic atrophic gastritis without bleeding: Secondary | ICD-10-CM | POA: Insufficient documentation

## 2012-02-11 HISTORY — PX: ESOPHAGOGASTRODUODENOSCOPY: SHX5428

## 2012-02-11 SURGERY — EGD (ESOPHAGOGASTRODUODENOSCOPY)
Anesthesia: Moderate Sedation

## 2012-02-11 MED ORDER — STERILE WATER FOR IRRIGATION IR SOLN
Status: DC | PRN
  Administered 2012-02-11: 11:00:00

## 2012-02-11 MED ORDER — MEPERIDINE HCL 100 MG/ML IJ SOLN
INTRAMUSCULAR | Status: DC | PRN
  Administered 2012-02-11: 50 mg via INTRAVENOUS
  Administered 2012-02-11: 25 mg via INTRAVENOUS

## 2012-02-11 MED ORDER — MIDAZOLAM HCL 5 MG/5ML IJ SOLN
INTRAMUSCULAR | Status: DC | PRN
  Administered 2012-02-11 (×2): 2 mg via INTRAVENOUS

## 2012-02-11 MED ORDER — MIDAZOLAM HCL 5 MG/5ML IJ SOLN
INTRAMUSCULAR | Status: AC
  Filled 2012-02-11: qty 10

## 2012-02-11 MED ORDER — MEPERIDINE HCL 100 MG/ML IJ SOLN
INTRAMUSCULAR | Status: AC
  Filled 2012-02-11: qty 1

## 2012-02-11 MED ORDER — BUTAMBEN-TETRACAINE-BENZOCAINE 2-2-14 % EX AERO
INHALATION_SPRAY | CUTANEOUS | Status: DC | PRN
  Administered 2012-02-11: 2 via TOPICAL

## 2012-02-11 NOTE — H&P (View-Only) (Signed)
Quick Note:  Negative Hepatitis panel.  AFP good. INR good.  BUN/Cr are elevated. I'm not sure if pt has hx of renal issues, or on diuretics. Didn't see in his med list. This needs to be sent to his PCP. Please make pt aware, and have him make an appt with his PCP to review these results.   ______

## 2012-02-11 NOTE — Discharge Instructions (Signed)
EGD Discharge instructions Please read the instructions outlined below and refer to this sheet in the next few weeks. These discharge instructions provide you with general information on caring for yourself after you leave the hospital. Your doctor may also give you specific instructions. While your treatment has been planned according to the most current medical practices available, unavoidable complications occasionally occur. If you have any problems or questions after discharge, please call your doctor. ACTIVITY  You may resume your regular activity but move at a slower pace for the next 24 hours.   Take frequent rest periods for the next 24 hours.   Walking will help expel (get rid of) the air and reduce the bloated feeling in your abdomen.   No driving for 24 hours (because of the anesthesia (medicine) used during the test).   You may shower.   Do not sign any important legal documents or operate any machinery for 24 hours (because of the anesthesia used during the test).  NUTRITION  Drink plenty of fluids.   You may resume your normal diet.   Begin with a light meal and progress to your normal diet.   Avoid alcoholic beverages for 24 hours or as instructed by your caregiver.  MEDICATIONS  You may resume your normal medications unless your caregiver tells you otherwise.  WHAT YOU CAN EXPECT TODAY  You may experience abdominal discomfort such as a feeling of fullness or "gas" pains.  FOLLOW-UP  Your doctor will discuss the results of your test with you.  SEEK IMMEDIATE MEDICAL ATTENTION IF ANY OF THE FOLLOWING OCCUR:  Excessive nausea (feeling sick to your stomach) and/or vomiting.   Severe abdominal pain and distention (swelling).   Trouble swallowing.   Temperature over 101 F (37.8 C).   Rectal bleeding or vomiting of blood.     Further recommendations to follow pending review of pathology.  You have inflammation in your esophagus and stomach. You do not  have varicose veins in your esophagus related to cirrhosis at this time. This is good news. You will need a repeat EGD in 2 years.  Followup abnormal blood work related to your kidney with your primary care physician

## 2012-02-11 NOTE — Interval H&P Note (Signed)
History and Physical Interval Note:  02/11/2012 10:57 AM  Justin Parsons  has presented today for surgery, with the diagnosis of screening for varices and cirrhosis  The various methods of treatment have been discussed with the patient and family. After consideration of risks, benefits and other options for treatment, the patient has consented to  Procedure(s) (LRB): ESOPHAGOGASTRODUODENOSCOPY (EGD) (N/A) as a surgical intervention .  The patients' history has been reviewed, patient examined, no change in status, stable for surgery.  I have reviewed the patients' chart and labs.  Questions were answered to the patient's satisfaction.     Manus Rudd

## 2012-02-11 NOTE — Op Note (Signed)
[facility] [address] [place], [place]  [address]  ENDOSCOPY PROCEDURE REPORT  PATIENT:  Cranford, Blessinger  MR#:  [id number] BIRTHDATE:  1941/04/02, 71 yrs. old  GENDER:  male  ENDOSCOPIST:  R. Garfield Cornea, MD Quentin Ore Referred by:  Monico Blitz, M.D.  PROCEDURE DATE:  02/11/2012 PROCEDURE:  EGD with esophageal and gastric biopsy  INDICATIONS:   screen for esophageal varices.  INFORMED CONSENT:   The risks, benefits, limitations, alternatives and imponderables have been discussed.  The potential for biopsy, esophogeal dilation, etc. have also been reviewed.  Questions have been answered.  All parties agreeable.  Please see the history and physical in the medical record for more information.  MEDICATIONS: Versed  4 mg IV and Demerol 75 mg IV in divided doses. Cetacaine spray.  DESCRIPTION OF PROCEDURE:   The EG-[id number] ([id number]) endoscope was introduced through the mouth and advanced to the second portion of the duodenum without difficulty or limitations.  The mucosal surfaces were surveyed very carefully during advancement of the scope and upon withdrawal.  Retroflexion view of the proximal stomach and esophagogastric junction was performed.  <<PROCEDUREIMAGES>>  FINDINGS:  "Kissing" esophageal ulcers appeared somewhat "punched out".Circumferential approximately 2-1/2-3 cm above the Z line. Otherwise, the esophageal mucosa appeared normal. The esophagus was patent throughout its course. No esophageal varices. Stomach empty.  . Small hiatal hernia. Diffuse snake skinning of the gastric mucosa consistent with portal gastropathy. Rather intense patchy erythema and erosions of the antrum and body. No ulcer or infiltrating process. Patent pylorus. Normal first and second portion of the duodenum  THERAPEUTIC / DIAGNOSTIC MANEUVERS PERFORMED:  Biopsies of the gastric mucosa and abnormal esophageal mucosa taken for histologic study.  COMPLICATIONS:    None  IMPRESSION:   Esophageal ulcers of uncertain significance. Query pill-induced injury but no symptoms consistent with such rocess - status post                   biopsy. Small hiatal hernia. Portal Gastropathy. Gastric erosions-status post biopsy.  RECOMMENDATIONS:   Swallowing precautions. Follow up on pathology. From a screening standpoint, patient shoulld have a repeat EGD in 2         years.  ______________________________ R. Garfield Cornea, MD Quentin Ore  CC:  n. eSIGNED:   R. Garfield Cornea at 02/11/2012 11:25 AM  Joylene John, [phone]

## 2012-02-14 ENCOUNTER — Encounter (HOSPITAL_COMMUNITY): Payer: Self-pay | Admitting: Internal Medicine

## 2012-02-17 ENCOUNTER — Encounter: Payer: Self-pay | Admitting: Internal Medicine

## 2012-02-19 ENCOUNTER — Encounter: Payer: Self-pay | Admitting: Internal Medicine

## 2012-06-04 ENCOUNTER — Emergency Department (HOSPITAL_COMMUNITY): Payer: Medicare Other

## 2012-06-04 ENCOUNTER — Inpatient Hospital Stay (HOSPITAL_COMMUNITY)
Admission: EM | Admit: 2012-06-04 | Discharge: 2012-06-07 | DRG: 683 | Disposition: A | Discharge status: Home / Self Care | Payer: Medicare Other | Attending: Internal Medicine | Admitting: Internal Medicine

## 2012-06-04 ENCOUNTER — Encounter (HOSPITAL_COMMUNITY): Payer: Self-pay | Admitting: Emergency Medicine

## 2012-06-04 DIAGNOSIS — E669 Obesity, unspecified: Secondary | ICD-10-CM | POA: Diagnosis present

## 2012-06-04 DIAGNOSIS — G459 Transient cerebral ischemic attack, unspecified: Secondary | ICD-10-CM | POA: Diagnosis present

## 2012-06-04 DIAGNOSIS — K7460 Unspecified cirrhosis of liver: Secondary | ICD-10-CM | POA: Diagnosis present

## 2012-06-04 DIAGNOSIS — Z6841 Body Mass Index (BMI) 40.0 and over, adult: Secondary | ICD-10-CM

## 2012-06-04 DIAGNOSIS — I129 Hypertensive chronic kidney disease with stage 1 through stage 4 chronic kidney disease, or unspecified chronic kidney disease: Secondary | ICD-10-CM | POA: Diagnosis present

## 2012-06-04 DIAGNOSIS — Z96659 Presence of unspecified artificial knee joint: Secondary | ICD-10-CM

## 2012-06-04 DIAGNOSIS — E875 Hyperkalemia: Secondary | ICD-10-CM | POA: Diagnosis present

## 2012-06-04 DIAGNOSIS — K219 Gastro-esophageal reflux disease without esophagitis: Secondary | ICD-10-CM

## 2012-06-04 DIAGNOSIS — N179 Acute kidney failure, unspecified: Principal | ICD-10-CM | POA: Diagnosis present

## 2012-06-04 DIAGNOSIS — E039 Hypothyroidism, unspecified: Secondary | ICD-10-CM | POA: Diagnosis present

## 2012-06-04 DIAGNOSIS — E860 Dehydration: Secondary | ICD-10-CM | POA: Diagnosis present

## 2012-06-04 DIAGNOSIS — H5440 Blindness, one eye, unspecified eye: Secondary | ICD-10-CM | POA: Diagnosis present

## 2012-06-04 DIAGNOSIS — Z970 Presence of artificial eye: Secondary | ICD-10-CM

## 2012-06-04 DIAGNOSIS — K7030 Alcoholic cirrhosis of liver without ascites: Secondary | ICD-10-CM | POA: Diagnosis present

## 2012-06-04 DIAGNOSIS — D638 Anemia in other chronic diseases classified elsewhere: Secondary | ICD-10-CM | POA: Diagnosis present

## 2012-06-04 DIAGNOSIS — K625 Hemorrhage of anus and rectum: Secondary | ICD-10-CM

## 2012-06-04 DIAGNOSIS — N289 Disorder of kidney and ureter, unspecified: Secondary | ICD-10-CM

## 2012-06-04 DIAGNOSIS — F1010 Alcohol abuse, uncomplicated: Secondary | ICD-10-CM | POA: Diagnosis present

## 2012-06-04 DIAGNOSIS — Z79899 Other long term (current) drug therapy: Secondary | ICD-10-CM

## 2012-06-04 DIAGNOSIS — N183 Chronic kidney disease, stage 3 unspecified: Secondary | ICD-10-CM | POA: Diagnosis present

## 2012-06-04 DIAGNOSIS — D649 Anemia, unspecified: Secondary | ICD-10-CM | POA: Diagnosis present

## 2012-06-04 DIAGNOSIS — D696 Thrombocytopenia, unspecified: Secondary | ICD-10-CM | POA: Diagnosis present

## 2012-06-04 DIAGNOSIS — Z96649 Presence of unspecified artificial hip joint: Secondary | ICD-10-CM

## 2012-06-04 DIAGNOSIS — M129 Arthropathy, unspecified: Secondary | ICD-10-CM | POA: Diagnosis present

## 2012-06-04 DIAGNOSIS — D369 Benign neoplasm, unspecified site: Secondary | ICD-10-CM

## 2012-06-04 DIAGNOSIS — Z23 Encounter for immunization: Secondary | ICD-10-CM

## 2012-06-04 LAB — HEPATIC FUNCTION PANEL
Bilirubin, Direct: 0.1 mg/dL (ref 0.0–0.3)
Indirect Bilirubin: 0.3 mg/dL (ref 0.3–0.9)
Total Bilirubin: 0.4 mg/dL (ref 0.3–1.2)

## 2012-06-04 LAB — CBC WITH DIFFERENTIAL/PLATELET
Basophils Relative: 0 % (ref 0–1)
Eosinophils Absolute: 0.3 10*3/uL (ref 0.0–0.7)
Lymphs Abs: 2.2 10*3/uL (ref 0.7–4.0)
MCH: 31.8 pg (ref 26.0–34.0)
Neutrophils Relative %: 68 % (ref 43–77)
Platelets: 130 10*3/uL — ABNORMAL LOW (ref 150–400)
RBC: 4.68 MIL/uL (ref 4.22–5.81)

## 2012-06-04 LAB — PROTIME-INR
INR: 1.11 (ref 0.00–1.49)
Prothrombin Time: 14.5 seconds (ref 11.6–15.2)

## 2012-06-04 LAB — BASIC METABOLIC PANEL
GFR calc Af Amer: 9 mL/min — ABNORMAL LOW (ref >90)
GFR calc non Af Amer: 8 mL/min — ABNORMAL LOW (ref >90)
Glucose, Bld: 110 mg/dL — ABNORMAL HIGH (ref 70–99)
Potassium: 4.8 mEq/L (ref 3.5–5.1)
Sodium: 135 mEq/L (ref 135–145)

## 2012-06-04 LAB — MRSA PCR SCREENING: MRSA by PCR: NEGATIVE

## 2012-06-04 LAB — APTT: aPTT: 31 seconds (ref 24–37)

## 2012-06-04 MED ORDER — MORPHINE SULFATE 2 MG/ML IJ SOLN
1.0000 mg | INTRAMUSCULAR | Status: DC | PRN
  Administered 2012-06-05 (×3): 1 mg via INTRAVENOUS
  Filled 2012-06-04 (×3): qty 1

## 2012-06-04 MED ORDER — HEPARIN SODIUM (PORCINE) 5000 UNIT/ML IJ SOLN
5000.0000 [IU] | Freq: Three times a day (TID) | INTRAMUSCULAR | Status: DC
  Administered 2012-06-04 – 2012-06-05 (×2): 5000 [IU] via SUBCUTANEOUS
  Filled 2012-06-04 (×5): qty 1

## 2012-06-04 MED ORDER — SODIUM CHLORIDE 0.9 % IV SOLN
INTRAVENOUS | Status: DC
  Administered 2012-06-04 – 2012-06-07 (×6): via INTRAVENOUS

## 2012-06-04 MED ORDER — SODIUM CHLORIDE 0.9 % IV BOLUS (SEPSIS)
1000.0000 mL | Freq: Once | INTRAVENOUS | Status: AC
  Administered 2012-06-04: 1000 mL via INTRAVENOUS

## 2012-06-04 MED ORDER — ONDANSETRON HCL 4 MG/2ML IJ SOLN: 4.0000 mg | Freq: Four times a day (QID) | INTRAMUSCULAR | Status: DC | PRN

## 2012-06-04 MED ORDER — OXYCODONE-ACETAMINOPHEN 5-325 MG PO TABS: 1.0000 | ORAL_TABLET | Freq: Three times a day (TID) | ORAL | Status: DC | PRN

## 2012-06-04 MED ORDER — ONDANSETRON HCL 4 MG PO TABS: 4.0000 mg | ORAL_TABLET | Freq: Four times a day (QID) | ORAL | Status: DC | PRN

## 2012-06-04 MED ORDER — PNEUMOCOCCAL VAC POLYVALENT 25 MCG/0.5ML IJ INJ
0.5000 mL | INJECTION | INTRAMUSCULAR | Status: AC
  Administered 2012-06-05: 0.5 mL via INTRAMUSCULAR
  Filled 2012-06-04: qty 0.5

## 2012-06-04 MED ORDER — ACETAMINOPHEN 325 MG PO TABS: 650.0000 mg | ORAL_TABLET | Freq: Four times a day (QID) | ORAL | Status: DC | PRN

## 2012-06-04 MED ORDER — PANTOPRAZOLE SODIUM 40 MG PO TBEC
40.0000 mg | DELAYED_RELEASE_TABLET | Freq: Every day | ORAL | Status: DC
  Administered 2012-06-05 – 2012-06-06 (×2): 40 mg via ORAL
  Filled 2012-06-04 (×2): qty 1

## 2012-06-04 NOTE — ED Notes (Signed)
Pt in CT.

## 2012-06-04 NOTE — Progress Notes (Signed)
Pt requesting to be started back on his Voltraren. Md paged. Md stated pt to not be started on Voltraren at this time. Order received for Tylenol prn q6 hrs

## 2012-06-04 NOTE — Progress Notes (Deleted)
Pt updated on status about starting back his Voltraren. Pt understands about risk to kidneys but states "he would rather die than not take his Voltraren." He also states if he misses any doses he will be unable to get out of bed. Pt is adamant about wanting to take his Voltraren.

## 2012-06-04 NOTE — ED Notes (Signed)
Pt states has been having episodes of generalized weakness, vision changes. Episode started again today around 9am.

## 2012-06-04 NOTE — H&P (Signed)
[facility] History and Physical  Justin Parsons JKD:[id number] DOB: 05-11-1941 DOA: 06/04/2012  Referring physician: Dr. Roderic Palau PCP: Monico Blitz, MD   Chief Complaint: Lightheadedness/dizziness. Left vision disturbance intermittently.  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 He began to feel lightheaded and dizzy. He admits to drinking moonshine a regular basis, in the evenings. Today he noticed blurred vision in the left eye, lasting 10 minutes, followed by normal vision. This cycle occurred several times. Now he says his vision is normal. 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 He does not complaining of any chest pain, dyspnea or palpitations.  Review of Systems:  Apart from history of present illness, all other systems negative.  Past Medical History  Diagnosis Date  . HTN (hypertension)   . Arthritis   . 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 Adenomatous polyp of colon   . Cirrhosis    Past Surgical History  Procedure Date  . Total hip arthroplasty 2002  . 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 Colonoscopy 08/2008    normal, repeat exam in 5-7 years  . Colonoscopy 2004    rectal adenomatous polyp  . Total hip arthroplasty 2006/2012    revision in 2012  . Esophagogastroduodenoscopy 02/11/2012    Procedure: ESOPHAGOGASTRODUODENOSCOPY (EGD);  Surgeon: Daneil Dolin, MD;  Location: [facility];  Service: Endoscopy;  Laterality: N/A;  11:00   Social History:  reports that he has never smoked. He does not have any smokeless tobacco history on file. He reports that he drinks alcohol. He reports that he does not use illicit drugs. [place] own [place]. He has  been divorced for over 30 years. He does not smoke cigarettes. He drinks alcohol as mentioned above. He is a Psychologist, sport and exercise. He is otherwise fully independent.  No Known Allergies  Family History  Problem Relation Age of Onset  . Colon cancer Neg Hx   . Ovarian cancer Sister     Prior to Admission medications   Medication Sig Start Date End Date Taking? Authorizing Provider  allopurinol (ZYLOPRIM) 100 MG tablet Take 100 mg by mouth daily.     Yes Historical Provider, MD  amLODipine (NORVASC) 2.5 MG tablet Take 2.5 mg by mouth daily.     Yes Historical Provider, MD  atenolol (TENORMIN) 50 MG tablet Take 50 mg by mouth Daily. 12/26/11  Yes Historical Provider, MD  diclofenac (VOLTAREN) 75 MG EC tablet Take 75 mg by mouth 2 (two) times daily.   Yes Historical Provider, MD  iron polysaccharides (NIFEREX) 150 MG capsule Take 150 mg by mouth daily.    Yes Historical Provider, MD  Multiple Vitamin (MULITIVITAMIN WITH MINERALS) TABS Take 1 tablet by mouth daily.   Yes Historical Provider, MD  omeprazole (PRILOSEC) 20 MG capsule Take 20 mg by mouth 2 (two) times daily.   Yes Historical Provider, MD  omeprazole (PRILOSEC) 20 MG capsule Take 1 capsule (20 mg total) by mouth daily. 05/01/11 04/30/12  Mahala Menghini, PA   Physical Exam: Filed Vitals:   06/04/12 1230 06/04/12 1330 06/04/12 1400 06/04/12 1539  BP: 89/49  80/40 86/56 105/51  Pulse:    70  Temp:      TempSrc:      Resp: 19 18 17 20   Height:      Weight:      SpO2:    97%     General:  Clinically dehydrated. Red cheeks.  Eyes: No jaundice, no pallor.  ENT: No abnormalities. I did not specifically examine her left eye in view of his history of transient ischemic symptoms. Pupils are reactive on the left side however. Eye movements on the left side are normal.  Neck: No lymphadenopathy.  Cardiovascular: Heart sounds are present and normal without murmurs.  Respiratory: Lung fields are clear.  Abdomen: Soft, nontender, no masses. No  evidence of hepatosplenomegaly. There is no evidence of chronic liver disease.  Skin: No rashes.  Musculoskeletal: No joint effusions.  Psychiatric: Affect normal.  Neurologic: Alert and orientated. No focal neurological signs otherwise. Is moving all his limbs. His speech is normal. His cognition is normal.  Labs on Admission:  Basic Metabolic Panel:  Lab 16/10/96 1121  NA 135  K 4.8  CL 101  CO2 18*  GLUCOSE 110*  BUN 67*  CREATININE 6.20*  CALCIUM 9.6  MG --  PHOS --   Liver Function Tests:  Lab 06/04/12 1201  AST 30  ALT 30  ALKPHOS 80  BILITOT 0.4  PROT 7.7  ALBUMIN 4.0     CBC:  Lab 06/04/12 1121  WBC 9.6  NEUTROABS 6.5  HGB 14.9  HCT 44.3  MCV 94.7  PLT 130*   Cardiac Enzymes:  Lab 06/04/12 1121  CKTOTAL --  CKMB --  CKMBINDEX --  TROPONINI <0.30      Radiological Exams on Admission: Ct Head Wo Contrast  06/04/2012  *RADIOLOGY REPORT*  Clinical Data: Code stroke.  Dizziness.  Blurred vision.  CT HEAD WITHOUT CONTRAST  Technique:  Contiguous axial images were obtained from the base of the skull through the vertex without contrast.  Comparison: None.  Findings: The brain shows generalized atrophy.  There is no evidence of acute or focal infarction, mass lesion, hemorrhage, hydrocephalus or extra-axial collection.  No inflammatory sinus disease.  There is atherosclerotic calcification of the major vessels at the base of the brain.  IMPRESSION: No acute finding.  Critical Value/emergent results were called by telephone at the time of interpretation on 06/04/2012 at 1140 hours to Dr. Roderic Palau, who verbally acknowledged these results.  Original Report Authenticated By: Jules Schick, M.D.   Dg Chest Port 1 View  06/04/2012  *RADIOLOGY REPORT*  Clinical Data: Shortness of breath.  PORTABLE CHEST - 1 VIEW  Comparison: No priors.  Findings: Lung volumes are low.  Study is limited by underpenetration of the film and the patient's large body habitus. With  these limitations in mind, the lungs appear essentially clear bilaterally, and there are no definite pleural effusions. Pulmonary venous engorgement is likely accentuated by low lung volumes.  Heart size is borderline enlarged.  Mediastinal contours are unremarkable.  IMPRESSION: 1. Low lung volumes without radiographic evidence of acute cardiopulmonary disease.  Original Report Authenticated By: Etheleen Mayhew, M.D.      Assessment/Plan   1. Acute renal failure, secondary to severe dehydration likely compounded by moonshine alcohol. Treat with aggressive intravenous fluid hydration. Monitor renal function closely. May need nephrology consultation. 2. Possible TIA, however severe acute renal failure and dehydration complicating symptoms. Will obtain neurology consultation. 3. Cirrhosis of the liver, likely alcoholic. 4. Anemia of  chronic disease. 5. Obesity.  Code Status: Full code Family Communication: . Discussed the above with the patient at the bedside. Disposition Plan: Discharge home when medically stable.  Time spent: 30 minutes.  Doree Albee [facility] Pager [phone].  If 7PM-7AM, please contact night-coverage [email or web address] Password [facility] 06/04/2012, 4:13 PM

## 2012-06-04 NOTE — ED Provider Notes (Cosign Needed)
History    This chart was scribed for Justin Diego, MD, MD by Rhae Lerner. The patient was seen in room [facility]19 and the patient's care was started at 11:57AM.  CSN: [id number]  Arrival date & time 06/04/12  1101   First MD Initiated Contact with Patient 06/04/12 1154      Chief Complaint  Patient presents with  . Code Stroke  . Dizziness  . Blurred Vision    (Consider location/radiation/quality/duration/timing/severity/associated sxs/prior treatment) The history is provided by the patient.   DEMITRUS Parsons is a 71 y.o. male who presents to the Emergency Department complaining of lightheadedness onset 1 month and visional changes onset today. Pt reports that it was like a screen came over his eye. Currently he is not experiencing any visual changes. He reports that he feels generalized weakness. Visual disturbances lasted 10 minutes and he has had several visual episodes today.  PCP is Dr. Manuella Ghazi   Past Medical History  Diagnosis Date  . HTN (hypertension)   . Arthritis   . 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 Adenomatous polyp of colon   . Cirrhosis     Past Surgical History  Procedure Date  . Total hip arthroplasty 2002  . 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 Colonoscopy 08/2008    normal, repeat exam in 5-7 years  . Colonoscopy 2004    rectal adenomatous polyp  . Total hip arthroplasty 2006/2012    revision in 2012  . Esophagogastroduodenoscopy 02/11/2012    Procedure: ESOPHAGOGASTRODUODENOSCOPY (EGD);  Surgeon: Daneil Dolin, MD;  Location: [facility];  Service: Endoscopy;  Laterality: N/A;  11:00    Family History  Problem Relation Age of Onset  . Colon cancer Neg Hx   . Ovarian cancer Sister     History  Substance Use Topics  . Smoking status: Never Smoker   . Smokeless tobacco: Not on file   Comment: used to chew tobacco, none in 15 years  . Alcohol Use: Yes     half a  gallon in a week but nothing in about a month or so      Review of Systems  All other systems reviewed and are negative.   10 Systems reviewed and all are negative for acute change except as noted in the HPI.   Allergies  Review of patient's allergies indicates no known allergies.  Home Medications   Current Outpatient Rx  Name Route Sig Dispense Refill  . ALLOPURINOL 100 MG PO TABS Oral Take 100 mg by mouth daily.      Marland Kitchen AMLODIPINE BESYLATE 2.5 MG PO TABS Oral Take 2.5 mg by mouth daily.      . ATENOLOL 50 MG PO TABS Oral Take 50 mg by mouth Daily.    Marland Kitchen DICLOFENAC SODIUM 50 MG PO TBEC Oral Take 50 mg by mouth 3 (three) times daily as needed. For arthritis pain    . POLYSACCHARIDE IRON COMPLEX 150 MG PO CAPS Oral Take 150 mg by mouth daily.     . ADULT MULTIVITAMIN W/MINERALS CH Oral Take 1 tablet by mouth daily.    Marland Kitchen OMEPRAZOLE 20 MG PO CPDR Oral Take 1 capsule (20 mg total) by mouth daily. 30 capsule 5    BP 95/59  Pulse 61  Temp 98 F (36.7 C) (Oral)  Resp 18  Ht 5' 9"  (1.753 m)  Wt  300 lb (136.079 kg)  BMI 44.30 kg/m2  SpO2 95%  Physical Exam  Nursing note and vitals reviewed. Constitutional: He is oriented to person, place, and time. He appears well-developed.  HENT:  Head: Normocephalic and atraumatic.  Eyes: Conjunctivae and EOM are normal. No scleral icterus.       False left eye   Neck: Neck supple. No thyromegaly present.  Cardiovascular: Normal rate and regular rhythm.  Exam reveals no gallop and no friction rub.   No murmur heard. Pulmonary/Chest: No stridor. He has no wheezes. He has no rales. He exhibits no tenderness.  Abdominal: He exhibits no distension. There is no tenderness. There is no rebound.       Obese   Musculoskeletal: Normal range of motion. He exhibits no edema.  Lymphadenopathy:    He has no cervical adenopathy.  Neurological: He is oriented to person, place, and time. Coordination normal.  Skin: No rash noted. No erythema.    Psychiatric: He has a normal mood and affect. His behavior is normal.    ED Course  Procedures (including critical care time) DIAGNOSTIC STUDIES: Oxygen Saturation is 95% on room air, normal by my interpretation.    COORDINATION OF CARE: 12:01PM EDP discusses pt ED treatment with pt     Labs Reviewed  CBC WITH DIFFERENTIAL - Abnormal; Notable for the following:    Platelets 130 (*)     All other components within normal limits  APTT  PROTIME-INR  BASIC METABOLIC PANEL  TROPONIN I  HEPATIC FUNCTION PANEL   Ct Head Wo Contrast  06/04/2012  *RADIOLOGY REPORT*  Clinical Data: Code stroke.  Dizziness.  Blurred vision.  CT HEAD WITHOUT CONTRAST  Technique:  Contiguous axial images were obtained from the base of the skull through the vertex without contrast.  Comparison: None.  Findings: The brain shows generalized atrophy.  There is no evidence of acute or focal infarction, mass lesion, hemorrhage, hydrocephalus or extra-axial collection.  No inflammatory sinus disease.  There is atherosclerotic calcification of the major vessels at the base of the brain.  IMPRESSION: No acute finding.  Critical Value/emergent results were called by telephone at the time of interpretation on 06/04/2012 at 1140 hours to Dr. Roderic Palau, who verbally acknowledged these results.  Original Report Authenticated By: Jules Schick, M.D.   Dg Chest Port 1 View  06/04/2012  *RADIOLOGY REPORT*  Clinical Data: Shortness of breath.  PORTABLE CHEST - 1 VIEW  Comparison: No priors.  Findings: Lung volumes are low.  Study is limited by underpenetration of the film and the patient's large body habitus. With these limitations in mind, the lungs appear essentially clear bilaterally, and there are no definite pleural effusions. Pulmonary venous engorgement is likely accentuated by low lung volumes.  Heart size is borderline enlarged.  Mediastinal contours are unremarkable.  IMPRESSION: 1. Low lung volumes without radiographic  evidence of acute cardiopulmonary disease.  Original Report Authenticated By: Etheleen Mayhew, M.D.     No diagnosis found.   Date: 06/04/2012  Rate:59  Rhythm: normal sinus rhythm  QRS Axis: normal  Intervals: normal  ST/T Wave abnormalities: nonspecific ST changes  Conduction Disutrbances:left bundle branch block  Narrative Interpretation:   Old EKG Reviewed: none available    MDM   The chart was scribed for me under my direct supervision.  I personally performed the history, physical, and medical decision making and all procedures in the evaluation of this patient.Justin Diego, MD  06/04/12 1557 

## 2012-06-04 NOTE — ED Notes (Signed)
C/o of generalized weakness and vision changes this morning ( as if a screen came down over his left eye)

## 2012-06-05 ENCOUNTER — Inpatient Hospital Stay (HOSPITAL_COMMUNITY): Payer: Medicare Other

## 2012-06-05 DIAGNOSIS — D649 Anemia, unspecified: Secondary | ICD-10-CM

## 2012-06-05 DIAGNOSIS — D696 Thrombocytopenia, unspecified: Secondary | ICD-10-CM | POA: Diagnosis present

## 2012-06-05 LAB — COMPREHENSIVE METABOLIC PANEL
AST: 21 U/L (ref 0–37)
BUN: 56 mg/dL — ABNORMAL HIGH (ref 6–23)
CO2: 22 mEq/L (ref 19–32)
Calcium: 8.4 mg/dL (ref 8.4–10.5)
Creatinine, Ser: 4.13 mg/dL — ABNORMAL HIGH (ref 0.50–1.35)
GFR calc non Af Amer: 13 mL/min — ABNORMAL LOW (ref >90)

## 2012-06-05 LAB — PROTIME-INR
INR: 1.18 (ref 0.00–1.49)
Prothrombin Time: 15.2 seconds (ref 11.6–15.2)

## 2012-06-05 LAB — CBC
Hemoglobin: 12.9 g/dL — ABNORMAL LOW (ref 13.0–17.0)
MCH: 31.6 pg (ref 26.0–34.0)
Platelets: 94 10*3/uL — ABNORMAL LOW (ref 150–400)
RBC: 4.08 MIL/uL — ABNORMAL LOW (ref 4.22–5.81)
WBC: 6.5 10*3/uL (ref 4.0–10.5)

## 2012-06-05 LAB — URINALYSIS, ROUTINE W REFLEX MICROSCOPIC
Bilirubin Urine: NEGATIVE
Nitrite: NEGATIVE
Specific Gravity, Urine: 1.015 (ref 1.005–1.030)
pH: 5.5 (ref 5.0–8.0)

## 2012-06-05 LAB — TSH: TSH: 6.306 u[IU]/mL — ABNORMAL HIGH (ref 0.350–4.500)

## 2012-06-05 MED ORDER — OXYCODONE-ACETAMINOPHEN 5-325 MG PO TABS
1.0000 | ORAL_TABLET | ORAL | Status: DC | PRN
  Administered 2012-06-06 – 2012-06-07 (×3): 2 via ORAL
  Filled 2012-06-05: qty 2
  Filled 2012-06-05 (×2): qty 1
  Filled 2012-06-05: qty 2

## 2012-06-05 MED ORDER — CLOPIDOGREL BISULFATE 75 MG PO TABS
75.0000 mg | ORAL_TABLET | Freq: Every day | ORAL | Status: DC
  Administered 2012-06-06 – 2012-06-07 (×2): 75 mg via ORAL
  Filled 2012-06-05 (×2): qty 1

## 2012-06-05 NOTE — Progress Notes (Signed)
Received report from [facility] in ICU.

## 2012-06-05 NOTE — Progress Notes (Addendum)
[facility] PROGRESS NOTE  Justin Parsons AJO:[id number] DOB: Mar 31, 1941 DOA: 06/04/2012 PCP: Monico Blitz, MD  Assessment/Plan: Principal Problem:  *ARF (acute renal failure) Active Problems:  Anemia  Cirrhosis  Obesity  Thrombocytopenia Morbid Obesity  Code Status: full code Family Communication: discussed with patient at bedside Disposition Plan: transfer to medical floor today   Brief narrative: This gentleman was admitted to the hospital with lightheadedness/dizziness and intermittent left vision disturbance. He was having these symptoms for a few days prior to admission. His vision has since returned to normal. He denies any other complaints. He does admit to drinking moonshine on a regular basis.  Consultants:  Neurology, Dr. Merlene Laughter  Procedures:  none  Antibiotics:  none  HPI/Subjective: Patient is any complaints at this time. He does have chronic joint inflammation for which he takes NSAIDs. 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  Objective: Filed Vitals:   06/05/12 0000 06/05/12 0400 06/05/12 0500 06/05/12 0941  BP: 95/57 105/69    Pulse: 80 58  56  Temp: 97.9 F (36.6 C) 97.4 F (36.3 C)    TempSrc: Oral Oral    Resp: 20 20    Height:      Weight:   131.1 kg (289 lb 0.4 oz)   SpO2: 97% 96%  95%    Intake/Output Summary (Last 24 hours) at 06/05/12 1014 Last data filed at 06/05/12 0600  Gross per 24 hour  Intake   4075 ml  Output   1600 ml  Net   2475 ml    Exam:   General:  Laying in bed, does not appear in any acute distress  Cardiovascular: S1, S2, regular rate and rhythm, no pedal edema  Respiratory: Clear to auscultation bilaterally  Abdomen: Soft, nontender, nondistended, bowel sounds are active  Data Reviewed: Basic Metabolic Panel:  Lab 94/70/96 0430 06/04/12 1121  NA 138 135  K 5.2* 4.8  CL 109 101  CO2 22 18*  GLUCOSE 87 110*  BUN 56* 67*  CREATININE 4.13* 6.20*  CALCIUM 8.4 9.6  MG --  --  PHOS -- --   Liver Function Tests:  Lab 06/05/12 0430 06/04/12 1201  AST 21 30  ALT 22 30  ALKPHOS 63 80  BILITOT 0.4 0.4  PROT 6.0 7.7  ALBUMIN 3.2* 4.0   No results found for this basename: LIPASE:5,AMYLASE:5 in the last 168 hours No results found for this basename: AMMONIA:5 in the last 168 hours CBC:  Lab 06/05/12 0430 06/04/12 1121  WBC 6.5 9.6  NEUTROABS -- 6.5  HGB 12.9* 14.9  HCT 38.7* 44.3  MCV 94.9 94.7  PLT 94* 130*   Cardiac Enzymes:  Lab 06/04/12 1121  CKTOTAL --  CKMB --  CKMBINDEX --  TROPONINI <0.30   BNP (last 3 results) No results found for this basename: PROBNP:3 in the last 8760 hours CBG: No results found for this basename: GLUCAP:5 in the last 168 hours  Recent Results (from the past 240 hour(s))  MRSA PCR SCREENING     Status: Normal   Collection Time   06/04/12  6:17 PM      Component Value Range Status Comment   MRSA by PCR NEGATIVE  NEGATIVE Final      Studies: Ct Head Wo Contrast  06/04/2012  *RADIOLOGY REPORT*  Clinical Data: Code stroke.  Dizziness.  Blurred vision.  CT HEAD WITHOUT CONTRAST  Technique:  Contiguous axial images were obtained from the base of the skull through the vertex without contrast.  Comparison: None.  Findings: The brain shows generalized atrophy.  There is no evidence of acute or focal infarction, mass lesion, hemorrhage, hydrocephalus or extra-axial collection.  No inflammatory sinus disease.  There is atherosclerotic calcification of the major vessels at the base of the brain.  IMPRESSION: No acute finding.  Critical Value/emergent results were called by telephone at the time of interpretation on 06/04/2012 at 1140 hours to Dr. Roderic Palau, who verbally acknowledged these results.  Original Report Authenticated By: Jules Schick, M.D.   Dg Chest Port 1 View  06/04/2012  *RADIOLOGY REPORT*  Clinical Data: Shortness of breath.  PORTABLE CHEST - 1 VIEW  Comparison: No priors.  Findings: Lung volumes are low.  Study  is limited by underpenetration of the film and the patient's large body habitus. With these limitations in mind, the lungs appear essentially clear bilaterally, and there are no definite pleural effusions. Pulmonary venous engorgement is likely accentuated by low lung volumes.  Heart size is borderline enlarged.  Mediastinal contours are unremarkable.  IMPRESSION: 1. Low lung volumes without radiographic evidence of acute cardiopulmonary disease.  Original Report Authenticated By: Etheleen Mayhew, M.D.    Scheduled Meds:    . clopidogrel  75 mg Oral Q breakfast  . heparin  5,000 Units Subcutaneous Q8H  . pantoprazole  40 mg Oral Q1200  . pneumococcal 23 valent vaccine  0.5 mL Intramuscular Tomorrow-1000  . sodium chloride  1,000 mL Intravenous Once   Continuous Infusions:    . sodium chloride 150 mL/hr at 06/05/12 0600    Principal Problem:  *ARF (acute renal failure) Active Problems:  Anemia  Cirrhosis  Obesity  Thrombocytopenia  Plan:  #1 acute renal failure. Likely due to volume depletion and chronic NSAID use. We will check a urinalysis, urine culture, renal ultrasound. Creatinine is improving with IV fluids, will continue current treatments.  #2. Anemia. Mild, likely due to chronic kidney disease.  #3. Chronic kidney disease stage III.  #4. Thrombocytopenia. Likely related to alcohol. Discontinue heparin and use SCDs for DVT prophylaxis  #5. Arthritis. Possibly rheumatoid arthritis. We will avoid any NSAIDs and try oral Percocet. He may need to be on chronic prednisone.  #6. Vision disturbances, possible TIA. Patient has been seen by neurology, await their recommendations.  #7. Disposition. Transfer to medical floor today.  Time spent: 16mns    Holland Nickson  [facility] Pager [phone] If 8PM-8AM, please contact night-coverage at [email or web address], password [email or web address]7/18/2013, 10:14 AM  LOS: 1 day

## 2012-06-05 NOTE — Progress Notes (Signed)
Patient resting in bed alert and oriented. Patient denies any needs at this time. Call light in reach. Will continue to monitor.

## 2012-06-05 NOTE — Progress Notes (Signed)
UR Chart Review Completed  

## 2012-06-05 NOTE — Consult Note (Signed)
[place] A. Merlene Laughter, MD     [email or web address]        NAME:  CLAIR, ALFIERI NO.:  [id number]  MEDICAL RECORD NO.:  [id number]  LOCATION:  IC05                          FACILITY:  [facility]  PHYSICIAN:  Kerryn Tennant A. Merlene Laughter, M.D. DATE OF BIRTH:  [birth year]  DATE OF CONSULTATION: DATE OF DISCHARGE:                                CONSULTATION   REASON FOR CONSULTATION:  Visual impairment on the left.  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  The patient's right eye was lost due to an infection.  The event lasted for about 10 minutes.  It was associated with lightheaded faint feeling and wobbliness of his gait.  He does have baseline gait impairment from marked osteoarthritis status post multiple joint replacements.  Again the blurred vision got better after about 10 minutes but his faint, dizzy, unsteadiness feeling persisted for longer and he decided to seek medical attention.  No dysarthria, dysphagia, focal weakness, or numbness.  No dyspnea or chest pain.  The patient does have a high potassium.  Examination is very suggestive of sleep apnea.  He does endorse habitual heavy snoring.  PHYSICAL EXAMINATION:  GENERAL:  Shows a pleasant man in no acute distress. HEENT EVALUATION:  Right orbit prosthesis in place.  Neck is supple. Head is normocephalic, atraumatic. He has a large stocky neck and large tongue with circumferential crowding of the posterior air space. ABDOMEN:  Obese but soft. EXTREMITIES:  Status post incisional scar, knee; status post replacement. NEUROLOGIC:  Mentation:  He is awake and alert.  Speech, language, and cognition are intact.  Cranial nerves shows that the left pupil is 4.5 to 5 mm and briskly reactive.  Extraocular movements are full on the left side.  Visual  fields are intact.  Facial muscle strength is symmetric.  Tongue is midline.  Uvula midline.  Shoulder shrug is normal.  Motor examination shows normal tone, bulk, and strength throughout.  There is no pronator drift.  Reflexes are 2+ throughout except the knees where the diminished plantars are both downgoing.  Coordination shows no dysmetria or tremors.  No parkinsonism.  Sensation normal to light touch and temperature.  SIGNIFICANT LABORATORY EVALUATION:  Creatinine was 6, is now down to 4. CT scan shows chronic changes.  There is calcification of the blood vessels.  ASSESSMENT: 1. Acute onset of possible amaurosis fugax on the left. 2. Acute gait ataxia in the setting of metabolic derangement,     dehydration, and acute renal failure. 3. Baseline gait impairment due to osteoarthritis. 4. 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  RECOMMENDATION: 1. Plavix for stroke prevention. 2. Carotid Doppler 3. Nocturnal polysomnography.     [facility] Cortez A. Merlene Laughter, M.D.     KAD/MEDQ  D:  06/05/2012  T:  06/05/2012  Job:  [id number]

## 2012-06-05 NOTE — Consult Note (Signed)
Reason for Consult: Referring Physician:   [name] is an 71 y.o. male.  HPI:   Past Medical History  Diagnosis Date  . HTN (hypertension)   . Arthritis   . 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 Adenomatous polyp of colon   . Cirrhosis     Past Surgical History  Procedure Date  . Total hip arthroplasty 2002  . 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 Colonoscopy 08/2008    normal, repeat exam in 5-7 years  . Colonoscopy 2004    rectal adenomatous polyp  . Total hip arthroplasty 2006/2012    revision in 2012  . Esophagogastroduodenoscopy 02/11/2012    Procedure: ESOPHAGOGASTRODUODENOSCOPY (EGD);  Surgeon: Daneil Dolin, MD;  Location: [facility];  Service: Endoscopy;  Laterality: N/A;  11:00    Family History  Problem Relation Age of Onset  . Colon cancer Neg Hx   . Ovarian cancer Sister     Social History:  reports that he has never smoked. He does not have any smokeless tobacco history on file. He reports that he drinks alcohol. He reports that he does not use illicit drugs.  Allergies: No Known Allergies  Medications:  Prior to Admission medications   Medication Sig Start Date End Date Taking? Authorizing Provider  allopurinol (ZYLOPRIM) 100 MG tablet Take 100 mg by mouth daily.     Yes Historical Provider, MD  amLODipine (NORVASC) 2.5 MG tablet Take 2.5 mg by mouth daily.     Yes Historical Provider, MD  atenolol (TENORMIN) 50 MG tablet Take 50 mg by mouth Daily. 12/26/11  Yes Historical Provider, MD  diclofenac (VOLTAREN) 75 MG EC tablet Take 75 mg by mouth 2 (two) times daily.   Yes Historical Provider, MD  iron polysaccharides (NIFEREX) 150 MG capsule Take 150 mg by mouth daily.    Yes Historical Provider, MD  Multiple Vitamin (MULITIVITAMIN WITH MINERALS) TABS Take 1 tablet by mouth daily.   Yes Historical Provider, MD  omeprazole (PRILOSEC) 20 MG capsule Take 20 mg by mouth 2  (two) times daily.   Yes Historical Provider, MD  omeprazole (PRILOSEC) 20 MG capsule Take 1 capsule (20 mg total) by mouth daily. 05/01/11 04/30/12  Mahala Menghini, PA   Scheduled Meds:   . heparin  5,000 Units Subcutaneous Q8H  . pantoprazole  40 mg Oral Q1200  . pneumococcal 23 valent vaccine  0.5 mL Intramuscular Tomorrow-1000  . sodium chloride  1,000 mL Intravenous Once   Continuous Infusions:   . sodium chloride 150 mL/hr at 06/05/12 0600   PRN Meds:.acetaminophen, morphine injection, ondansetron (ZOFRAN) IV, ondansetron, oxyCODONE-acetaminophen   Results for orders placed during the hospital encounter of 06/04/12 (from the past 48 hour(s))  CBC WITH DIFFERENTIAL     Status: Abnormal   Collection Time   06/04/12 11:21 AM      Component Value Range Comment   WBC 9.6  4.0 - 10.5 K/uL    RBC 4.68  4.22 - 5.81 MIL/uL    Hemoglobin 14.9  13.0 - 17.0 g/dL    HCT 44.3  39.0 - 52.0 %    MCV 94.7  78.0 - 100.0 fL    MCH 31.8  26.0 - 34.0 pg    MCHC 33.6  30.0 - 36.0 g/dL    RDW 14.5  11.5 - 15.5 %    Platelets 130 (*)  150 - 400 K/uL    Neutrophils Relative 68  43 - 77 %    Neutro Abs 6.5  1.7 - 7.7 K/uL    Lymphocytes Relative 23  12 - 46 %    Lymphs Abs 2.2  0.7 - 4.0 K/uL    Monocytes Relative 6  3 - 12 %    Monocytes Absolute 0.5  0.1 - 1.0 K/uL    Eosinophils Relative 3  0 - 5 %    Eosinophils Absolute 0.3  0.0 - 0.7 K/uL    Basophils Relative 0  0 - 1 %    Basophils Absolute 0.0  0.0 - 0.1 K/uL   BASIC METABOLIC PANEL     Status: Abnormal   Collection Time   06/04/12 11:21 AM      Component Value Range Comment   Sodium 135  135 - 145 mEq/L    Potassium 4.8  3.5 - 5.1 mEq/L    Chloride 101  96 - 112 mEq/L    CO2 18 (*) 19 - 32 mEq/L    Glucose, Bld 110 (*) 70 - 99 mg/dL    BUN 67 (*) 6 - 23 mg/dL    Creatinine, Ser 6.20 (*) 0.50 - 1.35 mg/dL    Calcium 9.6  8.4 - 10.5 mg/dL    GFR calc non Af Amer 8 (*) >90 mL/min    GFR calc Af Amer 9 (*) >90 mL/min   APTT      Status: Normal   Collection Time   06/04/12 11:21 AM      Component Value Range Comment   aPTT 31  24 - 37 seconds   PROTIME-INR     Status: Normal   Collection Time   06/04/12 11:21 AM      Component Value Range Comment   Prothrombin Time 14.5  11.6 - 15.2 seconds    INR 1.11  0.00 - 1.49   TROPONIN I     Status: Normal   Collection Time   06/04/12 11:21 AM      Component Value Range Comment   Troponin I <0.30  <0.30 ng/mL   HEPATIC FUNCTION PANEL     Status: Normal   Collection Time   06/04/12 12:01 PM      Component Value Range Comment   Total Protein 7.7  6.0 - 8.3 g/dL    Albumin 4.0  3.5 - 5.2 g/dL    AST 30  0 - 37 U/L    ALT 30  0 - 53 U/L    Alkaline Phosphatase 80  39 - 117 U/L    Total Bilirubin 0.4  0.3 - 1.2 mg/dL    Bilirubin, Direct 0.1  0.0 - 0.3 mg/dL    Indirect Bilirubin 0.3  0.3 - 0.9 mg/dL   MRSA PCR SCREENING     Status: Normal   Collection Time   06/04/12  6:17 PM      Component Value Range Comment   MRSA by PCR NEGATIVE  NEGATIVE   COMPREHENSIVE METABOLIC PANEL     Status: Abnormal   Collection Time   06/05/12  4:30 AM      Component Value Range Comment   Sodium 138  135 - 145 mEq/L    Potassium 5.2 (*) 3.5 - 5.1 mEq/L    Chloride 109  96 - 112 mEq/L    CO2 22  19 - 32 mEq/L    Glucose, Bld 87  70 - 99 mg/dL  BUN 56 (*) 6 - 23 mg/dL    Creatinine, Ser 4.13 (*) 0.50 - 1.35 mg/dL    Calcium 8.4  8.4 - 10.5 mg/dL    Total Protein 6.0  6.0 - 8.3 g/dL    Albumin 3.2 (*) 3.5 - 5.2 g/dL    AST 21  0 - 37 U/L    ALT 22  0 - 53 U/L    Alkaline Phosphatase 63  39 - 117 U/L    Total Bilirubin 0.4  0.3 - 1.2 mg/dL    GFR calc non Af Amer 13 (*) >90 mL/min    GFR calc Af Amer 15 (*) >90 mL/min   CBC     Status: Abnormal   Collection Time   06/05/12  4:30 AM      Component Value Range Comment   WBC 6.5  4.0 - 10.5 K/uL    RBC 4.08 (*) 4.22 - 5.81 MIL/uL    Hemoglobin 12.9 (*) 13.0 - 17.0 g/dL    HCT 38.7 (*) 39.0 - 52.0 %    MCV 94.9  78.0 - 100.0 fL     MCH 31.6  26.0 - 34.0 pg    MCHC 33.3  30.0 - 36.0 g/dL    RDW 14.4  11.5 - 15.5 %    Platelets 94 (*) 150 - 400 K/uL DELTA CHECK NOTED  PROTIME-INR     Status: Normal   Collection Time   06/05/12  4:30 AM      Component Value Range Comment   Prothrombin Time 15.2  11.6 - 15.2 seconds    INR 1.18  0.00 - 1.49     Ct Head Wo Contrast  06/04/2012  *RADIOLOGY REPORT*  Clinical Data: Code stroke.  Dizziness.  Blurred vision.  CT HEAD WITHOUT CONTRAST  Technique:  Contiguous axial images were obtained from the base of the skull through the vertex without contrast.  Comparison: None.  Findings: The brain shows generalized atrophy.  There is no evidence of acute or focal infarction, mass lesion, hemorrhage, hydrocephalus or extra-axial collection.  No inflammatory sinus disease.  There is atherosclerotic calcification of the major vessels at the base of the brain.  IMPRESSION: No acute finding.  Critical Value/emergent results were called by telephone at the time of interpretation on 06/04/2012 at 1140 hours to Dr. Roderic Palau, who verbally acknowledged these results.  Original Report Authenticated By: Jules Schick, M.D.   Dg Chest Port 1 View  06/04/2012  *RADIOLOGY REPORT*  Clinical Data: Shortness of breath.  PORTABLE CHEST - 1 VIEW  Comparison: No priors.  Findings: Lung volumes are low.  Study is limited by underpenetration of the film and the patient's large body habitus. With these limitations in mind, the lungs appear essentially clear bilaterally, and there are no definite pleural effusions. Pulmonary venous engorgement is likely accentuated by low lung volumes.  Heart size is borderline enlarged.  Mediastinal contours are unremarkable.  IMPRESSION: 1. Low lung volumes without radiographic evidence of acute cardiopulmonary disease.  Original Report Authenticated By: Etheleen Mayhew, M.D.    Review of Systems  Constitutional: Negative.   HENT: Negative.   Eyes: Positive for blurred vision.    Respiratory: Negative.   Cardiovascular: Negative.   Gastrointestinal: Negative.   Genitourinary: Negative.   Musculoskeletal: Negative.   Skin: Negative.   Endo/Heme/Allergies: Negative.    Blood pressure 105/69, pulse 58, temperature 97.4 F (36.3 C), temperature source Oral, resp. rate 20, height 5' 9"  (1.753 m), weight 131.1  kg (289 lb 0.4 oz), SpO2 96.00%. Physical Exam  Assessment/Plan: See dict  Justin Parsons 06/05/2012, 9:02 AM

## 2012-06-06 LAB — BASIC METABOLIC PANEL
BUN: 38 mg/dL — ABNORMAL HIGH (ref 6–23)
CO2: 23 mEq/L (ref 19–32)
Calcium: 9 mg/dL (ref 8.4–10.5)
Creatinine, Ser: 2.12 mg/dL — ABNORMAL HIGH (ref 0.50–1.35)
Glucose, Bld: 83 mg/dL (ref 70–99)

## 2012-06-06 LAB — CBC
MCH: 31.9 pg (ref 26.0–34.0)
MCHC: 33.3 g/dL (ref 30.0–36.0)
MCV: 95.7 fL (ref 78.0–100.0)
Platelets: 98 10*3/uL — ABNORMAL LOW (ref 150–400)
RDW: 14.5 % (ref 11.5–15.5)

## 2012-06-06 MED ORDER — METHYLPREDNISOLONE SODIUM SUCC 125 MG IJ SOLR
80.0000 mg | Freq: Once | INTRAMUSCULAR | Status: AC
  Administered 2012-06-06: 80 mg via INTRAVENOUS
  Filled 2012-06-06: qty 2

## 2012-06-06 MED ORDER — FUROSEMIDE 10 MG/ML IJ SOLN
40.0000 mg | Freq: Once | INTRAMUSCULAR | Status: AC
  Administered 2012-06-06: 40 mg via INTRAVENOUS
  Filled 2012-06-06: qty 4

## 2012-06-06 NOTE — Clinical Documentation Improvement (Signed)
BMI DOCUMENTATION CLARIFICATION QUERY  THIS DOCUMENT IS NOT A PERMANENT PART OF THE MEDICAL RECORD  TO RESPOND TO THE THIS QUERY, FOLLOW THE INSTRUCTIONS BELOW:  1. If needed, update documentation for the patient's encounter via the notes activity.  2. Access this query again and click edit on the In [employer].  3. After updating, or not, click F2 to complete all highlighted (required) fields concerning your review. Select "additional documentation in the medical record" OR "no additional documentation provided".  4. Click Sign note button.  5. The deficiency will fall out of your In Basket *Please let us know if you are not able to complete this workflow by phone or e-mail (listed below).         06/06/12  Dear Dr. Roderic Palau Rolley Sims  In an effort to better capture your patient's severity of illness, reflect appropriate length of stay and utilization of resources, a review of the patient medical record has revealed the following indicators. Based on your clinical judgment, please clarify and document in a progress note and/or discharge summary the clinical condition associated with the following supporting information. In responding to this query please exercise your independent judgment.  The fact that a query is asked, does not imply that any particular answer is desired or expected.  Possible Clinical conditions  Morbid Obesity W/ BMI>40 Underweight w/BMI<19 Other condition___________________ Cannot Clinically determine _____________  Clinical Information:  Risk Factors: Obesity  Signs & Symptoms: Weight: 289 lbs Height 5' 9"  BMI = 44.30  Treatment Renal diet w/1200 fluid restriction   Reviewed: additional documentation in the medical record  Thank You,  Debora T Williams RN, MSN Clinical Documentation Specialist: Office# [phone] [name]

## 2012-06-06 NOTE — Progress Notes (Signed)
Subjective: This man is clearly improved since admission. He is very keen to go home but his creatinine still not quite normalized although his ultrasound indicates probably chronic kidney disease as underlying. He is complaining about his arthritis, he has not had any diclofenac for this in light of his renal function.           Physical Exam: Blood pressure 146/72, pulse 63, temperature 98.1 F (36.7 C), temperature source Oral, resp. rate 20, height 5' 9"  (1.753 m), weight 131.1 kg (289 lb 0.4 oz), SpO2 95.00%. He looks systemically well. Heart sounds are present and normal. Lung fields are clear. He is alert and oriented.   Investigations:  Recent Results (from the past 240 hour(s))  MRSA PCR SCREENING     Status: Normal   Collection Time   06/04/12  6:17 PM      Component Value Range Status Comment   MRSA by PCR NEGATIVE  NEGATIVE Final      Basic Metabolic Panel:  Basename 06/06/12 0445 06/05/12 0430  NA 140 138  K 5.5* 5.2*  CL 111 109  CO2 23 22  GLUCOSE 83 87  BUN 38* 56*  CREATININE 2.12* 4.13*  CALCIUM 9.0 8.4  MG -- --  PHOS -- --   Liver Function Tests:  Basename 06/05/12 0430 06/04/12 1201  AST 21 30  ALT 22 30  ALKPHOS 63 80  BILITOT 0.4 0.4  PROT 6.0 7.7  ALBUMIN 3.2* 4.0     CBC:  Basename 06/06/12 0445 06/05/12 0430 06/04/12 1121  WBC 7.2 6.5 --  NEUTROABS -- -- 6.5  HGB 13.5 12.9* --  HCT 40.5 38.7* --  MCV 95.7 94.9 --  PLT 98* 94* --    Ct Head Wo Contrast  06/04/2012  *RADIOLOGY REPORT*  Clinical Data: Code stroke.  Dizziness.  Blurred vision.  CT HEAD WITHOUT CONTRAST  Technique:  Contiguous axial images were obtained from the base of the skull through the vertex without contrast.  Comparison: None.  Findings: The brain shows generalized atrophy.  There is no evidence of acute or focal infarction, mass lesion, hemorrhage, hydrocephalus or extra-axial collection.  No inflammatory sinus disease.  There is atherosclerotic  calcification of the major vessels at the base of the brain.  IMPRESSION: No acute finding.  Critical Value/emergent results were called by telephone at the time of interpretation on 06/04/2012 at 1140 hours to Dr. Roderic Palau, who verbally acknowledged these results.  Original Report Authenticated By: Jules Schick, M.D.   US Renal  06/05/2012  *RADIOLOGY REPORT*  Clinical Data: Acute renal failure  RENAL/URINARY TRACT ULTRASOUND COMPLETE  Comparison:  Abdominal ultrasound 01/21/2012  Findings:  Right Kidney:  9.6 cm.  Increased renal cortical echogenicity and cortical thinning again noted.  No hydronephrosis.  Left Kidney:  10.6 cm.  Increased renal cortical echogenicity and cortical thinning again noted.  No hydronephrosis.  Bladder:  Normal  IMPRESSION: Bilateral increased renal cortical echogenicity and renal cortical thinning, compatible with medical renal disease.  No acute abnormality.  Original Report Authenticated By: Arline Asp, M.D.   US Carotid Duplex Bilateral  06/05/2012  *RADIOLOGY REPORT*  Clinical Data: Amaurosis fugax, right side blindness.  Dizziness and blurred vision.  BILATERAL CAROTID DUPLEX ULTRASOUND  Technique: Pearline Cables scale imaging, color Doppler and duplex ultrasound was performed of bilateral carotid and vertebral arteries in the neck.  Comparison:  No similar prior study is available for comparison.  Criteria:  Quantification of carotid stenosis is based on velocity  parameters that correlate the residual internal carotid diameter with NASCET-based stenosis levels, using the diameter of the distal internal carotid lumen as the denominator for stenosis measurement.  The following velocity measurements were obtained:                   PEAK SYSTOLIC/END DIASTOLIC RIGHT ICA:                        63/18cm/sec CCA:                        84/53MI/WOE SYSTOLIC ICA/CCA RATIO:     0.8 DIASTOLIC ICA/CCA RATIO:    1.0 ECA:                        78cm/sec  LEFT ICA:                         72/27cm/sec CCA:                        32/12YQ/MGN SYSTOLIC ICA/CCA RATIO:     0.9 DIASTOLIC ICA/CCA RATIO:    1.8 ECA:                        60cm/sec  Findings:  RIGHT CAROTID ARTERY: Minimal echogenic carotid bulb plaque noted, producing less than 10% stenosis by gray scale criteria.  RIGHT VERTEBRAL ARTERY:  Antegrade  LEFT CAROTID ARTERY: Minimal echogenic carotid bulb plaque noted, producing less than 10% stenosis by gray scale criteria.  LEFT VERTEBRAL ARTERY:  Antegrade  Waveform morphologies are within normal limits.  Left internal carotid artery is tortuous.  IMPRESSION: No sonographic evidence for hemodynamically significant stenosis in either carotid arterial system.  Original Report Authenticated By: Arline Asp, M.D.   Dg Chest Port 1 View  06/04/2012  *RADIOLOGY REPORT*  Clinical Data: Shortness of breath.  PORTABLE CHEST - 1 VIEW  Comparison: No priors.  Findings: Lung volumes are low.  Study is limited by underpenetration of the film and the patient's large body habitus. With these limitations in mind, the lungs appear essentially clear bilaterally, and there are no definite pleural effusions. Pulmonary venous engorgement is likely accentuated by low lung volumes.  Heart size is borderline enlarged.  Mediastinal contours are unremarkable.  IMPRESSION: 1. Low lung volumes without radiographic evidence of acute cardiopulmonary disease.  Original Report Authenticated By: Etheleen Mayhew, M.D.      Medications:  Scheduled:   . clopidogrel  75 mg Oral Q breakfast  . furosemide  40 mg Intravenous Once  . methylPREDNISolone (SOLU-MEDROL) injection  80 mg Intravenous Once  . pantoprazole  40 mg Oral Q1200  . pneumococcal 23 valent vaccine  0.5 mL Intramuscular Tomorrow-1000  . DISCONTD: heparin  5,000 Units Subcutaneous Q8H    Impression: 1. Acute renal failure, improving. Likely chronic kidney disease underlying. 2. Hyperkalemia. 3. Cirrhosis of the liver, alcohol  related. 4. Abuse alcohol, moonshine. 5. Obesity.     Plan: 1. One dose of intravenous Solu-Medrol for his arthritis. 2. 1 dose of Lasix 40 mg IV in view of his hyperkalemia. Continue with IV fluids. 3. Hopefully, he can be discharged home tomorrow.     LOS: 2 days   Doree Albee Pager [phone]  06/06/2012, 9:00 AM

## 2012-06-07 DIAGNOSIS — N289 Disorder of kidney and ureter, unspecified: Secondary | ICD-10-CM

## 2012-06-07 LAB — URINE CULTURE

## 2012-06-07 LAB — CBC
MCHC: 33.7 g/dL (ref 30.0–36.0)
RDW: 13.8 % (ref 11.5–15.5)

## 2012-06-07 LAB — COMPREHENSIVE METABOLIC PANEL
ALT: 20 U/L (ref 0–53)
Albumin: 3.5 g/dL (ref 3.5–5.2)
Alkaline Phosphatase: 67 U/L (ref 39–117)
Calcium: 9.2 mg/dL (ref 8.4–10.5)
Potassium: 5.1 mEq/L (ref 3.5–5.1)
Sodium: 134 mEq/L — ABNORMAL LOW (ref 135–145)
Total Protein: 6.9 g/dL (ref 6.0–8.3)

## 2012-06-07 MED ORDER — LEVOTHYROXINE SODIUM 25 MCG PO TABS: 25.0000 ug | ORAL_TABLET | Freq: Every day | ORAL | Status: DC

## 2012-06-07 MED ORDER — OXYCODONE-ACETAMINOPHEN 5-325 MG PO TABS: 1.0000 | ORAL_TABLET | ORAL | Status: AC | PRN

## 2012-06-07 MED ORDER — CLOPIDOGREL BISULFATE 75 MG PO TABS: 75.0000 mg | ORAL_TABLET | Freq: Every day | ORAL | Status: AC

## 2012-06-07 NOTE — Discharge Summary (Signed)
Physician Discharge Summary  Justin Parsons DSK:[id number] DOB: December 29, 1940 DOA: 06/04/2012  PCP: Monico Blitz, MD  Admit date: 06/04/2012 Discharge date: 06/07/2012  Recommendations for Outpatient Follow-up:  1. Follow with primary care physician-monitor renal function, thyroid function and blood pressure.  2. Diclofenac has been discontinued in view of his renal failure. Followup with his primary care physician regarding his arthritis.  Discharge Diagnoses:    1. Acute on chronic renal failure, stabilized. Chronic renal failure likely related to hypertension. Ultrasound of the kidneys show renal cortical thinning, compatible with medical renal disease. No hydronephrosis. 3. Newly diagnosed hypothyroidism. Levothyroid started. 4. Hypertension, stable. 5. 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  6. Probable TIA affecting left vision. No evidence of CVA, bilateral carotid Dopplers negative. Discharge Condition: Stable.  Diet recommendation: Renal diet.  History of present illness:  This 71 year old man presented to the hospital with symptoms of lightheadedness/dizziness and left vision field disturbance with blurring of his vision intermittently. By the time he reached the emergency room his vision was actually normal. He had a creatinine above 6 and was clinically very dehydrated. Therefore he was given aggressive intravenous fluids which improved his creatinine and overall condition over the course of the next few days. Ultrasound of his kidneys showed cortical thinning consistent with medical renal disease. He does admit to drinking moonshine on a daily basis despite his recent presumed diagnosis of cirrhosis of the liver. He was seen by neurology in regard to his possible TIA. Plavix has been prescribed. Also, TSH was elevated to 6.3. He was therefore discharged with replacement therapy.  Hospital Course:  As  above.  Procedures:  None.  Consultations:  Neurology, Dr Merlene Laughter.  Discharge Exam: Filed Vitals:   06/07/12 0629  BP: 130/85  Pulse: 61  Temp: 97.6 F (36.4 C)  Resp: 20   Filed Vitals:   06/06/12 0609 06/06/12 1400 06/06/12 2244 06/07/12 0629  BP: 146/72 105/73 120/77 130/85  Pulse:  57 68 61  Temp:  97.8 F (36.6 C) 98.7 F (37.1 C) 97.6 F (36.4 C)  TempSrc:  Oral  Oral  Resp:  20 20 20   Height:      Weight:      SpO2:  94% 94% 96%   General: Looks better hydrated now. Obese. Cardiovascular: Heart sounds are present and normal. Respiratory: Lung fields clear. Alert and orientated without any focal neurologic signs.  Discharge Instructions  Discharge Orders    Future Orders Please Complete By Expires   Diet - low sodium heart healthy      Increase activity slowly        Medication List  As of 06/07/2012  8:37 AM   STOP taking these medications         diclofenac 75 MG EC tablet         TAKE these medications         allopurinol 100 MG tablet   Commonly known as: ZYLOPRIM   Take 100 mg by mouth daily.      amLODipine 2.5 MG tablet   Commonly known as: NORVASC   Take 2.5 mg by mouth daily.      atenolol 50 MG tablet   Commonly known as: TENORMIN   Take 50 mg by mouth Daily.      clopidogrel 75 MG tablet   Commonly known as: PLAVIX   Take 1 tablet (75 mg total) by mouth daily with breakfast.      iron polysaccharides 150 MG  capsule   Commonly known as: NIFEREX   Take 150 mg by mouth daily.      levothyroxine 25 MCG tablet   Commonly known as: SYNTHROID, LEVOTHROID   Take 1 tablet (25 mcg total) by mouth daily.      multivitamin with minerals Tabs   Take 1 tablet by mouth daily.      omeprazole 20 MG capsule   Commonly known as: PRILOSEC   Take 20 mg by mouth 2 (two) times daily.      oxyCODONE-acetaminophen 5-325 MG per tablet   Commonly known as: PERCOCET/ROXICET   Take 1-2 tablets by mouth every 4 (four) hours as needed.            Follow-up Information    Follow up with [facility], MD. Schedule an appointment as soon as possible for a visit in 1 week.   Contact information:   [address]  [place] [phone]           The results of significant diagnostics from this hospitalization (including imaging, microbiology, ancillary and laboratory) are listed below for reference.    Significant Diagnostic Studies: Ct Head Wo Contrast  06/04/2012  *RADIOLOGY REPORT*  Clinical Data: Code stroke.  Dizziness.  Blurred vision.  CT HEAD WITHOUT CONTRAST  Technique:  Contiguous axial images were obtained from the base of the skull through the vertex without contrast.  Comparison: None.  Findings: The brain shows generalized atrophy.  There is no evidence of acute or focal infarction, mass lesion, hemorrhage, hydrocephalus or extra-axial collection.  No inflammatory sinus disease.  There is atherosclerotic calcification of the major vessels at the base of the brain.  IMPRESSION: No acute finding.  Critical Value/emergent results were called by telephone at the time of interpretation on 06/04/2012 at 1140 hours to Dr. Roderic Palau, who verbally acknowledged these results.  Original Report Authenticated By: Jules Schick, M.D.   US Renal  06/05/2012  *RADIOLOGY REPORT*  Clinical Data: Acute renal failure  RENAL/URINARY TRACT ULTRASOUND COMPLETE  Comparison:  Abdominal ultrasound 01/21/2012  Findings:  Right Kidney:  9.6 cm.  Increased renal cortical echogenicity and cortical thinning again noted.  No hydronephrosis.  Left Kidney:  10.6 cm.  Increased renal cortical echogenicity and cortical thinning again noted.  No hydronephrosis.  Bladder:  Normal  IMPRESSION: Bilateral increased renal cortical echogenicity and renal cortical thinning, compatible with medical renal disease.  No acute abnormality.  Original Report Authenticated By: Arline Asp, M.D.   US Carotid Duplex Bilateral  06/05/2012  *RADIOLOGY  REPORT*  Clinical Data: Amaurosis fugax, right side blindness.  Dizziness and blurred vision.  BILATERAL CAROTID DUPLEX ULTRASOUND  Technique: Pearline Cables scale imaging, color Doppler and duplex ultrasound was performed of bilateral carotid and vertebral arteries in the neck.  Comparison:  No similar prior study is available for comparison.  Criteria:  Quantification of carotid stenosis is based on velocity parameters that correlate the residual internal carotid diameter with NASCET-based stenosis levels, using the diameter of the distal internal carotid lumen as the denominator for stenosis measurement.  The following velocity measurements were obtained:                   PEAK SYSTOLIC/END DIASTOLIC RIGHT ICA:                        63/18cm/sec CCA:  72/62MB/TDH SYSTOLIC ICA/CCA RATIO:     0.8 DIASTOLIC ICA/CCA RATIO:    1.0 ECA:                        78cm/sec  LEFT ICA:                        72/27cm/sec CCA:                        74/16LA/GTX SYSTOLIC ICA/CCA RATIO:     0.9 DIASTOLIC ICA/CCA RATIO:    1.8 ECA:                        60cm/sec  Findings:  RIGHT CAROTID ARTERY: Minimal echogenic carotid bulb plaque noted, producing less than 10% stenosis by gray scale criteria.  RIGHT VERTEBRAL ARTERY:  Antegrade  LEFT CAROTID ARTERY: Minimal echogenic carotid bulb plaque noted, producing less than 10% stenosis by gray scale criteria.  LEFT VERTEBRAL ARTERY:  Antegrade  Waveform morphologies are within normal limits.  Left internal carotid artery is tortuous.  IMPRESSION: No sonographic evidence for hemodynamically significant stenosis in either carotid arterial system.  Original Report Authenticated By: Arline Asp, M.D.   Dg Chest Port 1 View  06/04/2012  *RADIOLOGY REPORT*  Clinical Data: Shortness of breath.  PORTABLE CHEST - 1 VIEW  Comparison: No priors.  Findings: Lung volumes are low.  Study is limited by underpenetration of the film and the patient's large body habitus. With these  limitations in mind, the lungs appear essentially clear bilaterally, and there are no definite pleural effusions. Pulmonary venous engorgement is likely accentuated by low lung volumes.  Heart size is borderline enlarged.  Mediastinal contours are unremarkable.  IMPRESSION: 1. Low lung volumes without radiographic evidence of acute cardiopulmonary disease.  Original Report Authenticated By: Etheleen Mayhew, M.D.    Microbiology: Recent Results (from the past 240 hour(s))  MRSA PCR SCREENING     Status: Normal   Collection Time   06/04/12  6:17 PM      Component Value Range Status Comment   MRSA by PCR NEGATIVE  NEGATIVE Final      Labs: Basic Metabolic Panel:  Lab 64/68/03 0635 06/06/12 0445 06/05/12 0430 06/04/12 1121  NA 134* 140 138 135  K 5.1 5.5* 5.2* 4.8  CL 103 111 109 101  CO2 24 23 22  18*  GLUCOSE 145* 83 87 110*  BUN 37* 38* 56* 67*  CREATININE 2.06* 2.12* 4.13* 6.20*  CALCIUM 9.2 9.0 8.4 9.6  MG -- -- -- --  PHOS -- -- -- --   Liver Function Tests:  Lab 06/07/12 0635 06/05/12 0430 06/04/12 1201  AST 17 21 30   ALT 20 22 30   ALKPHOS 67 63 80  BILITOT 0.5 0.4 0.4  PROT 6.9 6.0 7.7  ALBUMIN 3.5 3.2* 4.0     CBC:  Lab 06/07/12 0635 06/06/12 0445 06/05/12 0430 06/04/12 1121  WBC 9.7 7.2 6.5 9.6  NEUTROABS -- -- -- 6.5  HGB 13.6 13.5 12.9* 14.9  HCT 40.4 40.5 38.7* 44.3  MCV 93.5 95.7 94.9 94.7  PLT 109* 98* 94* 130*   Cardiac Enzymes:  Lab 06/04/12 1121  CKTOTAL --  CKMB --  CKMBINDEX --  TROPONINI <0.30      Time coordinating discharge: Less than 30 minutes.  SignedDoree Albee  [facility] 06/07/2012, 8:37 AM

## 2012-06-07 NOTE — Progress Notes (Signed)
Pt with orders to be discharge home. Discharge instructions given, pt verbalized understanding. Pt in stable condition upon discharge. Pt left via private vehicle with friend.

## 2013-07-21 ENCOUNTER — Encounter: Payer: Self-pay | Admitting: Internal Medicine

## 2013-11-12 ENCOUNTER — Emergency Department (HOSPITAL_COMMUNITY): Payer: Medicare Other

## 2013-11-12 ENCOUNTER — Encounter (HOSPITAL_COMMUNITY): Payer: Self-pay | Admitting: Emergency Medicine

## 2013-11-12 ENCOUNTER — Inpatient Hospital Stay (HOSPITAL_COMMUNITY): Payer: Medicare Other

## 2013-11-12 ENCOUNTER — Inpatient Hospital Stay (HOSPITAL_COMMUNITY)
Admission: EM | Admit: 2013-11-12 | Discharge: 2013-11-18 | DRG: 291 | Disposition: A | Discharge status: Home / Self Care | Payer: Medicare Other | Attending: Family Medicine | Admitting: Family Medicine

## 2013-11-12 DIAGNOSIS — I509 Heart failure, unspecified: Secondary | ICD-10-CM | POA: Diagnosis present

## 2013-11-12 DIAGNOSIS — Z96659 Presence of unspecified artificial knee joint: Secondary | ICD-10-CM

## 2013-11-12 DIAGNOSIS — Z91199 Patient's noncompliance with other medical treatment and regimen due to unspecified reason: Secondary | ICD-10-CM

## 2013-11-12 DIAGNOSIS — Z86718 Personal history of other venous thrombosis and embolism: Secondary | ICD-10-CM

## 2013-11-12 DIAGNOSIS — I129 Hypertensive chronic kidney disease with stage 1 through stage 4 chronic kidney disease, or unspecified chronic kidney disease: Secondary | ICD-10-CM | POA: Diagnosis present

## 2013-11-12 DIAGNOSIS — E669 Obesity, unspecified: Secondary | ICD-10-CM | POA: Diagnosis present

## 2013-11-12 DIAGNOSIS — Z96649 Presence of unspecified artificial hip joint: Secondary | ICD-10-CM

## 2013-11-12 DIAGNOSIS — R45851 Suicidal ideations: Secondary | ICD-10-CM

## 2013-11-12 DIAGNOSIS — T502X5A Adverse effect of carbonic-anhydrase inhibitors, benzothiadiazides and other diuretics, initial encounter: Secondary | ICD-10-CM | POA: Diagnosis present

## 2013-11-12 DIAGNOSIS — R0600 Dyspnea, unspecified: Secondary | ICD-10-CM | POA: Diagnosis present

## 2013-11-12 DIAGNOSIS — N183 Chronic kidney disease, stage 3 unspecified: Secondary | ICD-10-CM

## 2013-11-12 DIAGNOSIS — H5440 Blindness, one eye, unspecified eye: Secondary | ICD-10-CM | POA: Diagnosis present

## 2013-11-12 DIAGNOSIS — R0609 Other forms of dyspnea: Secondary | ICD-10-CM

## 2013-11-12 DIAGNOSIS — D6959 Other secondary thrombocytopenia: Secondary | ICD-10-CM | POA: Diagnosis present

## 2013-11-12 DIAGNOSIS — J189 Pneumonia, unspecified organism: Secondary | ICD-10-CM

## 2013-11-12 DIAGNOSIS — E039 Hypothyroidism, unspecified: Secondary | ICD-10-CM | POA: Diagnosis present

## 2013-11-12 DIAGNOSIS — K7030 Alcoholic cirrhosis of liver without ascites: Secondary | ICD-10-CM | POA: Diagnosis present

## 2013-11-12 DIAGNOSIS — N179 Acute kidney failure, unspecified: Secondary | ICD-10-CM

## 2013-11-12 DIAGNOSIS — M1990 Unspecified osteoarthritis, unspecified site: Secondary | ICD-10-CM | POA: Diagnosis present

## 2013-11-12 DIAGNOSIS — Z6841 Body Mass Index (BMI) 40.0 and over, adult: Secondary | ICD-10-CM

## 2013-11-12 DIAGNOSIS — K7460 Unspecified cirrhosis of liver: Secondary | ICD-10-CM

## 2013-11-12 DIAGNOSIS — K219 Gastro-esophageal reflux disease without esophagitis: Secondary | ICD-10-CM | POA: Diagnosis present

## 2013-11-12 DIAGNOSIS — F1020 Alcohol dependence, uncomplicated: Secondary | ICD-10-CM | POA: Diagnosis present

## 2013-11-12 DIAGNOSIS — I4891 Unspecified atrial fibrillation: Secondary | ICD-10-CM

## 2013-11-12 DIAGNOSIS — J9600 Acute respiratory failure, unspecified whether with hypoxia or hypercapnia: Secondary | ICD-10-CM

## 2013-11-12 DIAGNOSIS — Z23 Encounter for immunization: Secondary | ICD-10-CM

## 2013-11-12 DIAGNOSIS — Z8041 Family history of malignant neoplasm of ovary: Secondary | ICD-10-CM

## 2013-11-12 DIAGNOSIS — I5031 Acute diastolic (congestive) heart failure: Principal | ICD-10-CM

## 2013-11-12 DIAGNOSIS — Z8673 Personal history of transient ischemic attack (TIA), and cerebral infarction without residual deficits: Secondary | ICD-10-CM

## 2013-11-12 DIAGNOSIS — Z9119 Patient's noncompliance with other medical treatment and regimen: Secondary | ICD-10-CM

## 2013-11-12 HISTORY — DX: Transient cerebral ischemic attack, unspecified: G45.9

## 2013-11-12 HISTORY — DX: Hypothyroidism, unspecified: E03.9

## 2013-11-12 HISTORY — DX: Diaphragmatic hernia without obstruction or gangrene: K44.9

## 2013-11-12 HISTORY — DX: Alcohol dependence, uncomplicated: F10.20

## 2013-11-12 HISTORY — DX: Portal vein thrombosis: I81

## 2013-11-12 HISTORY — DX: Unspecified cirrhosis of liver: K74.60

## 2013-11-12 LAB — URINALYSIS, ROUTINE W REFLEX MICROSCOPIC
Bilirubin Urine: NEGATIVE
Glucose, UA: NEGATIVE mg/dL
Hgb urine dipstick: NEGATIVE
Nitrite: NEGATIVE
Protein, ur: NEGATIVE mg/dL
Specific Gravity, Urine: <1.005 — ABNORMAL LOW (ref 1.005–1.030)
pH: 5.5 (ref 5.0–8.0)

## 2013-11-12 LAB — COMPREHENSIVE METABOLIC PANEL
ALT: 18 U/L (ref 0–53)
AST: 21 U/L (ref 0–37)
CO2: 27 mEq/L (ref 19–32)
Calcium: 9.2 mg/dL (ref 8.4–10.5)
Sodium: 137 mEq/L (ref 135–145)
Total Protein: 7.1 g/dL (ref 6.0–8.3)

## 2013-11-12 LAB — CBC WITH DIFFERENTIAL/PLATELET
Basophils Absolute: 0 10*3/uL (ref 0.0–0.1)
Eosinophils Relative: 3 % (ref 0–5)
HCT: 46.5 % (ref 39.0–52.0)
Hemoglobin: 15.5 g/dL (ref 13.0–17.0)
Lymphocytes Relative: 17 % (ref 12–46)
MCHC: 33.3 g/dL (ref 30.0–36.0)
MCV: 94.1 fL (ref 78.0–100.0)
Monocytes Absolute: 0.9 10*3/uL (ref 0.1–1.0)
Monocytes Relative: 8 % (ref 3–12)
RDW: 15.2 % (ref 11.5–15.5)
WBC: 10.9 10*3/uL — ABNORMAL HIGH (ref 4.0–10.5)

## 2013-11-12 LAB — TROPONIN I: Troponin I: <0.3 ng/mL (ref <0.30)

## 2013-11-12 LAB — PRO B NATRIURETIC PEPTIDE: Pro B Natriuretic peptide (BNP): 4100 pg/mL — ABNORMAL HIGH (ref 0–125)

## 2013-11-12 LAB — PROTIME-INR: Prothrombin Time: 14.4 seconds (ref 11.6–15.2)

## 2013-11-12 LAB — APTT: aPTT: 32 seconds (ref 24–37)

## 2013-11-12 MED ORDER — PANTOPRAZOLE SODIUM 40 MG PO TBEC
80.0000 mg | DELAYED_RELEASE_TABLET | Freq: Two times a day (BID) | ORAL | Status: DC
  Administered 2013-11-12 – 2013-11-18 (×12): 80 mg via ORAL
  Filled 2013-11-12 (×13): qty 2

## 2013-11-12 MED ORDER — ACETAMINOPHEN 500 MG PO TABS: 500.0000 mg | ORAL_TABLET | Freq: Four times a day (QID) | ORAL | Status: DC | PRN

## 2013-11-12 MED ORDER — ASPIRIN EC 81 MG PO TBEC
81.0000 mg | DELAYED_RELEASE_TABLET | Freq: Every day | ORAL | Status: DC
  Administered 2013-11-12 – 2013-11-18 (×7): 81 mg via ORAL
  Filled 2013-11-12 (×7): qty 1

## 2013-11-12 MED ORDER — FOLIC ACID 1 MG PO TABS
1.0000 mg | ORAL_TABLET | Freq: Every day | ORAL | Status: DC
  Administered 2013-11-12 – 2013-11-18 (×7): 1 mg via ORAL
  Filled 2013-11-12 (×7): qty 1

## 2013-11-12 MED ORDER — ALLOPURINOL 100 MG PO TABS
100.0000 mg | ORAL_TABLET | Freq: Every day | ORAL | Status: DC
  Administered 2013-11-13 – 2013-11-18 (×6): 100 mg via ORAL
  Filled 2013-11-12 (×6): qty 1

## 2013-11-12 MED ORDER — TRAZODONE HCL 50 MG PO TABS: 25.0000 mg | ORAL_TABLET | Freq: Every evening | ORAL | Status: DC | PRN

## 2013-11-12 MED ORDER — POTASSIUM CHLORIDE CRYS ER 20 MEQ PO TBCR
40.0000 meq | EXTENDED_RELEASE_TABLET | Freq: Once | ORAL | Status: AC
  Administered 2013-11-12: 40 meq via ORAL
  Filled 2013-11-12: qty 2

## 2013-11-12 MED ORDER — HEPARIN SODIUM (PORCINE) 5000 UNIT/ML IJ SOLN
5000.0000 [IU] | Freq: Three times a day (TID) | INTRAMUSCULAR | Status: DC
  Administered 2013-11-12 – 2013-11-18 (×15): 5000 [IU] via SUBCUTANEOUS
  Filled 2013-11-12 (×15): qty 1

## 2013-11-12 MED ORDER — DEXTROSE 5 % IV SOLN
500.0000 mg | Freq: Once | INTRAVENOUS | Status: AC
  Administered 2013-11-12: 500 mg via INTRAVENOUS

## 2013-11-12 MED ORDER — OXYCODONE HCL 5 MG PO TABS
5.0000 mg | ORAL_TABLET | ORAL | Status: DC | PRN
  Administered 2013-11-13 – 2013-11-18 (×9): 5 mg via ORAL
  Filled 2013-11-12 (×9): qty 1

## 2013-11-12 MED ORDER — AMLODIPINE BESYLATE 5 MG PO TABS
2.5000 mg | ORAL_TABLET | Freq: Every day | ORAL | Status: DC
  Administered 2013-11-13 – 2013-11-18 (×6): 2.5 mg via ORAL
  Filled 2013-11-12 (×6): qty 1

## 2013-11-12 MED ORDER — SODIUM CHLORIDE 0.9 % IJ SOLN: 3.0000 mL | INTRAMUSCULAR | Status: DC | PRN

## 2013-11-12 MED ORDER — ACETAMINOPHEN 650 MG RE SUPP: 650.0000 mg | Freq: Four times a day (QID) | RECTAL | Status: DC | PRN

## 2013-11-12 MED ORDER — DEXTROSE 5 % IV SOLN
1.0000 g | Freq: Once | INTRAVENOUS | Status: AC
  Administered 2013-11-12: 1 g via INTRAVENOUS
  Filled 2013-11-12: qty 10

## 2013-11-12 MED ORDER — HYDROMORPHONE HCL PF 1 MG/ML IJ SOLN
0.5000 mg | INTRAMUSCULAR | Status: DC | PRN
  Administered 2013-11-15 – 2013-11-17 (×9): 0.5 mg via INTRAVENOUS
  Filled 2013-11-12 (×9): qty 1

## 2013-11-12 MED ORDER — FUROSEMIDE 10 MG/ML IJ SOLN
40.0000 mg | Freq: Once | INTRAMUSCULAR | Status: AC
  Administered 2013-11-12: 40 mg via INTRAVENOUS
  Filled 2013-11-12 (×2): qty 4

## 2013-11-12 MED ORDER — SODIUM CHLORIDE 0.9 % IJ SOLN
3.0000 mL | Freq: Two times a day (BID) | INTRAMUSCULAR | Status: DC
Start: 2013-11-12 — End: 2013-11-18
  Administered 2013-11-13 – 2013-11-16 (×3): 3 mL via INTRAVENOUS

## 2013-11-12 MED ORDER — SODIUM CHLORIDE 0.9 % IJ SOLN
3.0000 mL | Freq: Two times a day (BID) | INTRAMUSCULAR | Status: DC
  Administered 2013-11-12 – 2013-11-14 (×3): 3 mL via INTRAVENOUS

## 2013-11-12 MED ORDER — ATENOLOL 25 MG PO TABS
50.0000 mg | ORAL_TABLET | Freq: Every day | ORAL | Status: DC
  Administered 2013-11-13 – 2013-11-18 (×6): 50 mg via ORAL
  Filled 2013-11-12 (×6): qty 2

## 2013-11-12 MED ORDER — LEVOTHYROXINE SODIUM 25 MCG PO TABS
25.0000 ug | ORAL_TABLET | Freq: Every day | ORAL | Status: DC
  Administered 2013-11-13 – 2013-11-18 (×6): 25 ug via ORAL
  Filled 2013-11-12 (×6): qty 1

## 2013-11-12 MED ORDER — SODIUM CHLORIDE 0.9 % IV SOLN: 250.0000 mL | INTRAVENOUS | Status: DC | PRN

## 2013-11-12 MED ORDER — ONDANSETRON HCL 4 MG/2ML IJ SOLN: 4.0000 mg | INTRAMUSCULAR | Status: DC | PRN

## 2013-11-12 MED ORDER — INFLUENZA VAC SPLIT QUAD 0.5 ML IM SUSP
0.5000 mL | INTRAMUSCULAR | Status: AC
  Administered 2013-11-13: 0.5 mL via INTRAMUSCULAR
  Filled 2013-11-12: qty 0.5

## 2013-11-12 NOTE — ED Provider Notes (Signed)
CSN: [id number]     Arrival date & time 11/12/13  1515 History   First MD Initiated Contact with Patient 11/12/13 1522     This chart was scribed for Nat Christen, MD by Era Bumpers, ED scribe. This patient was seen in room APA14/APA14 and the patient's care was started at 1522.  Chief Complaint  Patient presents with  . Shortness of Breath   The history is provided by the patient.   HPI Comments: Justin Parsons is a 72 y.o. male who presents to the Emergency Department complaining of moderate shortness of breath, onset 2 days ago. 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 He denies any CP. Denies any previous similar episodes. Denies hx of COPD or cardiac hx. He states feeling mild indigestion and belching a lot. He denies sweating, emesis episodes. He has never smoked. PCP Dr. Brigitte Pulse at [facility]   Past Medical History  Diagnosis Date  . HTN (hypertension)   . Arthritis   . 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 Adenomatous polyp of colon   . Hypothyroidism   . TIA (transient ischemic attack)   . Alcoholism     moonshine  . Cirrhosis of liver     no esophageal varices on EGD 01/2012  . Acute renal failure (ARF) 05/2012  . CKD (chronic kidney disease)   . 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 Hiatal hernia    Past Surgical History  Procedure Laterality Date  . Total hip arthroplasty  2002  . Total knee arthroplasty  1999/2003    left/right  . S/p eye implant  1997    artificial eye, right  . Colonoscopy  08/2008    normal, repeat exam in 5-7 years  . Colonoscopy  2004    rectal adenomatous polyp  . Total hip arthroplasty  2006/2012    revision in 2012  . Esophagogastroduodenoscopy  02/11/2012    Procedure: ESOPHAGOGASTRODUODENOSCOPY (EGD);  Surgeon: Daneil Dolin, MD;  Location: [facility];  Service: Endoscopy;  Laterality: N/A;  11:00  . Joint replacement    . Eye surgery    .  Cardiac catheterization  2003   Family History  Problem Relation Age of Onset  . Colon cancer Neg Hx   . Ovarian cancer Sister    History  Substance Use Topics  . Smoking status: Never Smoker   . Smokeless tobacco: Not on file     Comment: used to chew tobacco, none in 15 years  . Alcohol Use: Yes     Comment: half a gallon in a week but nothing in about a month or so    Review of Systems  Constitutional: Negative for fever and chills.  HENT: Negative for congestion and rhinorrhea.   Respiratory: Positive for shortness of breath. Negative for cough.   Cardiovascular: Negative for chest pain.  Gastrointestinal: Negative for nausea, vomiting, abdominal pain and diarrhea.  Musculoskeletal: Negative for back pain.  Skin: Negative for color change and rash.  Neurological: Negative for syncope.  All other systems reviewed and are negative.   A complete 10 system review of systems was obtained and all systems are negative except as noted in the HPI and PMH.   Allergies  Review of patient's allergies indicates no known allergies.  Home Medications   Current Outpatient Rx  Name  Route  Sig  Dispense  Refill  .  allopurinol (ZYLOPRIM) 100 MG tablet   Oral   Take 100 mg by mouth daily.           Marland Kitchen amLODipine (NORVASC) 2.5 MG tablet   Oral   Take 2.5 mg by mouth daily.           Marland Kitchen atenolol (TENORMIN) 50 MG tablet   Oral   Take 50 mg by mouth Daily.         . iron polysaccharides (NIFEREX) 150 MG capsule   Oral   Take 150 mg by mouth daily.          Marland Kitchen EXPIRED: levothyroxine (LEVOTHROID) 25 MCG tablet   Oral   Take 1 tablet (25 mcg total) by mouth daily.   30 tablet   0   . Multiple Vitamin (MULITIVITAMIN WITH MINERALS) TABS   Oral   Take 1 tablet by mouth daily.         Marland Kitchen omeprazole (PRILOSEC) 20 MG capsule   Oral   Take 20 mg by mouth 2 (two) times daily.          Triage Vitals: BP 113/49  Pulse 69  Temp(Src) 97.3 F (36.3 C) (Oral)  Resp 20  Ht  5' 9"  (1.753 m)  Wt 300 lb (136.079 kg)  BMI 44.28 kg/m2  SpO2 98% Physical Exam  Nursing note and vitals reviewed. Constitutional: He is oriented to person, place, and time. He appears well-developed and well-nourished.  HENT:  Head: Normocephalic and atraumatic.  Eyes: Conjunctivae and EOM are normal. Pupils are equal, round, and reactive to light.  Neck: Normal range of motion. Neck supple.  Cardiovascular: Normal rate, regular rhythm and normal heart sounds.   Pulmonary/Chest: Effort normal and breath sounds normal. No respiratory distress.  Abdominal: Soft. Bowel sounds are normal.  Musculoskeletal: Normal range of motion.  Neurological: He is alert and oriented to person, place, and time.  Skin: Skin is warm and dry.  Psychiatric: He has a normal mood and affect. His behavior is normal.    ED Course  Procedures (including critical care time) DIAGNOSTIC STUDIES: Oxygen Saturation is 98% on room air, normal by my interpretation.    COORDINATION OF CARE: At 340 PM Discussed treatment plan with patient which includes chest x-ray, labs.. Patient agrees.   Labs Review Labs Reviewed  CBC WITH DIFFERENTIAL - Abnormal; Notable for the following:    WBC 10.9 (*)    Platelets 148 (*)    Neutro Abs 7.8 (*)    All other components within normal limits  COMPREHENSIVE METABOLIC PANEL - Abnormal; Notable for the following:    Creatinine, Ser 1.67 (*)    GFR calc non Af Amer 39 (*)    GFR calc Af Amer 45 (*)    All other components within normal limits  PRO B NATRIURETIC PEPTIDE - Abnormal; Notable for the following:    Pro B Natriuretic peptide (BNP) 4100.0 (*)    All other components within normal limits  TROPONIN I  LIPASE, BLOOD   Imaging Review Dg Chest 2 View  11/12/2013   CLINICAL DATA:  Short of breath. Acute renal failure. Hiatal hernia.  EXAM: CHEST  2 VIEW  COMPARISON:  06/04/2012.  FINDINGS: Cardiomegaly is present. There is engorgement of the vascular pedicle with  pulmonary vascular congestion. Consolidation is present in the right lower lobe. Right pleural effusion is present with fluid tracking into the major fissure. Probable lumbar overlying emphysema. Mid thoracic compression fracture. The left lung base appears clear.  Monitoring leads project over the chest.  IMPRESSION: 1. Cardiomegaly and pulmonary vascular congestion. 2. Consolidation at the right lung base suggesting right middle and/or right lower lobe pneumonia. Asymmetric/atypical pulmonary edema is in the differential considerations. 3. Small right pleural effusion.   Electronically Signed   By: Dereck Ligas M.D.   On: 11/12/2013 17:31    EKG Interpretation    Date/Time:  Thursday November 12 2013 15:33:50 EST Ventricular Rate:  63 PR Interval:    QRS Duration: 108 QT Interval:  370 QTC Calculation: 378 R Axis:   -45 Text Interpretation:  Atrial fibrillation Left axis deviation Septal infarct , age undetermined Abnormal ECG When compared with ECG of 04-Jun-2012 11:42, Atrial fibrillation has replaced Sinus rhythm Septal infarct is now Present Confirmed by Lonisha Bobby  MD, Dabney Dever ([id number]) on 11/12/2013 5:04:00 PM            MDM  No diagnosis found. Chest x-ray reveals a right middle/right lower lobe pneumonia.  Rx IV Rocephin, IV Zithromax. Patient is hemodynamically stable. Admit.   I personally performed the services described in this documentation, which was scribed in my presence. The recorded information has been reviewed and is accurate.      Nat Christen, MD 11/12/13 [phone]

## 2013-11-12 NOTE — H&P (Signed)
[facility] History and Physical  Justin Parsons  KGM:[id number]  DOB: [birth year]   DOA: 11/12/2013   PCP:   Monico Blitz, MD   Chief Complaint:  Shortness of breath for 2 days  HPI: Justin Parsons is a 72 y.o. male.   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 He denies fever cough or cold; denies nausea vomiting diarrhea. Feels as if he has something stuck in his throat.  In the emergency room his chest x-ray was abnormal and hospitalist service was called to admit for CAP.  EKG shows new onset of afib. He denies palpitations. Drinks coffee.  Past Medical History  Diagnosis Date  . HTN (hypertension)   . Arthritis   . 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 Adenomatous polyp of colon   . Hypothyroidism   . TIA (transient ischemic attack)   . Alcoholism     moonshine  . Cirrhosis of liver     no esophageal varices on EGD 01/2012  . Acute renal failure (ARF) 05/2012  . CKD (chronic kidney disease)   . 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 Hiatal hernia     Past Surgical History  Procedure Laterality Date  . Total hip arthroplasty  2002  . Total knee arthroplasty  1999/2003    left/right  . S/p eye implant  1997    artificial eye, right  . Colonoscopy  08/2008    normal, repeat exam in 5-7 years  . Colonoscopy  2004    rectal adenomatous polyp  . Total hip arthroplasty  2006/2012    revision in 2012  . Esophagogastroduodenoscopy  02/11/2012    Procedure: ESOPHAGOGASTRODUODENOSCOPY (EGD);  Surgeon: Daneil Dolin, MD;  Location: [facility];  Service: Endoscopy;  Laterality: N/A;  11:00  . Joint replacement    . Eye surgery    . Cardiac catheterization  2003    Medications:  HOME MEDS: Prior to Admission medications   Medication Sig Start Date End Date Taking? Authorizing Provider  allopurinol  (ZYLOPRIM) 100 MG tablet Take 100 mg by mouth daily.     Yes Historical Provider, MD  amLODipine (NORVASC) 2.5 MG tablet Take 2.5 mg by mouth daily.     Yes Historical Provider, MD  Ascorbic Acid (VITAMIN C PO) Take 1 tablet by mouth daily.   Yes Historical Provider, MD  atenolol (TENORMIN) 50 MG tablet Take 50 mg by mouth Daily. 12/26/11  Yes Historical Provider, MD  diclofenac (VOLTAREN) 75 MG EC tablet Take 75 mg by mouth 2 (two) times daily. 09/21/13  Yes Historical Provider, MD  iron polysaccharides (NIFEREX) 150 MG capsule Take 150 mg by mouth daily.    Yes Historical Provider, MD  levothyroxine (SYNTHROID, LEVOTHROID) 25 MCG tablet Take 25 mcg by mouth daily before breakfast.   Yes Historical Provider, MD  omeprazole (PRILOSEC) 20 MG capsule Take 20 mg by mouth 2 (two) times daily.   Yes Historical Provider, MD  oxymetazoline (AFRIN) 0.05 % nasal spray Place 1 spray into both nostrils 2 (two) times daily as needed for congestion.   Yes Historical Provider, MD  levothyroxine (LEVOTHROID) 25 MCG tablet Take 1 tablet (25 mcg total) by mouth daily. 06/07/12 06/07/13  Doree Albee, MD     Allergies:  No Known Allergies  Social History:   reports that he  has never smoked. He does not have any smokeless tobacco history on file. He reports that he drinks alcohol. He reports that he does not use illicit drugs.  Family History: Family History  Problem Relation Age of Onset  . Colon cancer Neg Hx   . Ovarian cancer Sister      Physical Exam: Filed Vitals:   11/12/13 1527 11/12/13 1600 11/12/13 1700 11/12/13 1900  BP: 113/49 115/78 105/77 131/83  Pulse: 69 71  95  Temp: 97.3 F (36.3 C)   97.3 F (36.3 C)  TempSrc: Oral   Oral  Resp: 20     Height: 5' 9"  (1.753 m)   5' 9"  (1.753 m)  Weight: 136.079 kg (300 lb)   130.6 kg (287 lb 14.7 oz)  SpO2: 98% 100%  95%   Blood pressure 131/83, pulse 95, temperature 97.3 F (36.3 C), temperature source Oral, resp. rate 20, height 5' 9"  (1.753  m), weight 130.6 kg (287 lb 14.7 oz), SpO2 95.00%. Body mass index is 42.5 kg/(m^2).   GEN:  Pleasant morbidly obese elderly Caucasian gentleman lying bed breathing heavily; cooperative with exam PSYCH:  alert and oriented x4;  neither anxious nor depressed; affect is appropriate. HEENT: Mucous membranes pink and anicteric; PERRLA; EOM intact; thick neck Breasts:: Not examined CHEST WALL: Chest CHEST: Normal respiration, clear to auscultation bilaterally HEART: Regular no murmurs rubs or gallops BACK:  no CVA tenderness ABDOMEN: Obese, soft non-tender; no masses, no organomegaly, normal abdominal bowel sounds;  no intertriginous candida. Rectal Exam: Not done EXTREMITIES:  age-appropriate arthropathy of the hands and knees; papular urticaria; trace edema; no ulcerations. Genitalia: not examined PULSES: 2+ and symmetric CNS: Cranial nerves 2-12 grossly intact no focal lateralizing neurologic deficit   Labs on Admission:  Basic Metabolic Panel:  Recent Labs Lab 11/12/13 1600  NA 137  K 4.4  CL 99  CO2 27  GLUCOSE 99  BUN 23  CREATININE 1.67*  CALCIUM 9.2   Liver Function Tests:  Recent Labs Lab 11/12/13 1600  AST 21  ALT 18  ALKPHOS 73  BILITOT 0.6  PROT 7.1  ALBUMIN 3.6    Recent Labs Lab 11/12/13 1600  LIPASE 59   No results found for this basename: AMMONIA,  in the last 168 hours CBC:  Recent Labs Lab 11/12/13 1600  WBC 10.9*  NEUTROABS 7.8*  HGB 15.5  HCT 46.5  MCV 94.1  PLT 148*   Cardiac Enzymes:  Recent Labs Lab 11/12/13 1600  TROPONINI <0.30   BNP: No components found with this basename: POCBNP,  D-dimer: No components found with this basename: D-DIMER,  CBG: No results found for this basename: GLUCAP,  in the last 168 hours  Radiological Exams on Admission: Dg Chest 2 View  11/12/2013   CLINICAL DATA:  Short of breath. Acute renal failure. Hiatal hernia.  EXAM: CHEST  2 VIEW  COMPARISON:  06/04/2012.  FINDINGS: Cardiomegaly is  present. There is engorgement of the vascular pedicle with pulmonary vascular congestion. Consolidation is present in the right lower lobe. Right pleural effusion is present with fluid tracking into the major fissure. Probable lumbar overlying emphysema. Mid thoracic compression fracture. The left lung base appears clear. Monitoring leads project over the chest.  IMPRESSION: 1. Cardiomegaly and pulmonary vascular congestion. 2. Consolidation at the right lung base suggesting right middle and/or right lower lobe pneumonia. Asymmetric/atypical pulmonary edema is in the differential considerations. 3. Small right pleural effusion.   Electronically Signed   By: Dereck Ligas  M.D.   On: 11/12/2013 17:31    EKG: Independently reviewed. Atrial fibrillation; controlled rate   Assessment/Plan    Active Problems:   GERD (gastroesophageal reflux disease)   Cirrhosis   Obesity   A-fib, newly diagnosed   CKD (chronic kidney disease), stage III   Unspecified hypothyroidism   Dyspnea   PLAN: Because of elevated BNP, and shortness of breath, will give a dose of IV Lasix No strong evidence for pneumonia; will hold on antibiotics and get a plain CT scan of the chest for further evaluation Check PT and PTT on Concerta anticoagulations for A. Fib; 2-D echo  Other plans as per orders.  Code Status: Full code   Jaydalee Bardwell Nocturnist [facility] Pager [phone]   11/12/2013, 8:55 PM

## 2013-11-12 NOTE — ED Notes (Addendum)
Sob for 2 days, says he feels like something in his  Throat  "doesn't feel right" Clearing is throat frequently.  Says he has had increased "indigestion ,with belching  For last 2 days"

## 2013-11-13 DIAGNOSIS — I509 Heart failure, unspecified: Secondary | ICD-10-CM

## 2013-11-13 DIAGNOSIS — J9600 Acute respiratory failure, unspecified whether with hypoxia or hypercapnia: Secondary | ICD-10-CM | POA: Diagnosis present

## 2013-11-13 DIAGNOSIS — I5031 Acute diastolic (congestive) heart failure: Secondary | ICD-10-CM | POA: Diagnosis present

## 2013-11-13 DIAGNOSIS — I369 Nonrheumatic tricuspid valve disorder, unspecified: Secondary | ICD-10-CM

## 2013-11-13 LAB — BASIC METABOLIC PANEL
BUN: 21 mg/dL (ref 6–23)
Chloride: 100 mEq/L (ref 96–112)
GFR calc Af Amer: 42 mL/min — ABNORMAL LOW (ref >90)
Potassium: 4.3 mEq/L (ref 3.5–5.1)

## 2013-11-13 LAB — CBC
HCT: 45.2 % (ref 39.0–52.0)
Hemoglobin: 14.7 g/dL (ref 13.0–17.0)
MCHC: 32.5 g/dL (ref 30.0–36.0)
Platelets: 141 10*3/uL — ABNORMAL LOW (ref 150–400)

## 2013-11-13 MED ORDER — POLYETHYLENE GLYCOL 3350 17 G PO PACK
17.0000 g | PACK | Freq: Every day | ORAL | Status: DC
  Administered 2013-11-13 – 2013-11-15 (×3): 17 g via ORAL
  Filled 2013-11-13 (×3): qty 1

## 2013-11-13 MED ORDER — FUROSEMIDE 10 MG/ML IJ SOLN
40.0000 mg | Freq: Every day | INTRAMUSCULAR | Status: DC
  Administered 2013-11-13 – 2013-11-14 (×2): 40 mg via INTRAVENOUS
  Filled 2013-11-13 (×2): qty 4

## 2013-11-13 NOTE — Progress Notes (Signed)
[facility] PROGRESS NOTE  EULON ALLNUTT SHF:[id number] DOB: 1941/09/23 DOA: 11/12/2013 PCP: Monico Blitz, MD  Assessment/Plan: 1. Acute respiratory failure. Initially admitted for treatment acquired pneumonia. Since patient did not have any significant fever, WBC count or cough, pneumonia less likely in this situation. The patient did have an elevated BNP and had a good response to Lasix. It is possible that his shortness of breath is due to volume overload. CT scan of the chest was done which indicated some pleural thickening consistent with possible empyema versus underlying malignancy. We will request a pulmonology consultation to further review this and see if any further additional testing is required. Wean down oxygen as tolerated 2. Acute diastolic congestive heart failure. Ejection fraction is intact on echocardiogram. Cardiac markers have been negative. Continue with Lasix. 3. Atrial fibrillation. EKG on admission indicated fibrillation. Rate appears to be controlled. Continue on beta blocker. He does not appear appropriate candidate for anticoagulation due to his ongoing alcohol use non compliance 4. Chronic kidney disease stage III. Creatinine appears to be near baseline. We'll continue to follow with ongoing diuresis. 5. Hypothyroidism. TSH normal.  Code Status: full code Family Communication: discussed with patient Disposition Plan: discharge home once improved   Consultants:  none  Procedures: Echo: Left ventricle: LVOT is narrow There is turbulent flow through the LVOT but no significant outflow obstruction at rest. The cavity size was normal. Wall thickness was increased in a pattern of severe LVH. Systolic function was normal. The estimated ejection fraction was in the range of 60% to 65%.     Antibiotics:  none  HPI/Subjective: Feeling better, less short of breath, no cough or chest pain  Objective: Filed Vitals:   11/13/13 1432  BP: 125/65   Pulse: 87  Temp: 97.5 F (36.4 C)  Resp: 20    Intake/Output Summary (Last 24 hours) at 11/13/13 1700 Last data filed at 11/13/13 1245  Gross per 24 hour  Intake    243 ml  Output   5900 ml  Net  -5657 ml   Filed Weights   11/12/13 1527 11/12/13 1900 11/13/13 0554  Weight: 136.079 kg (300 lb) 130.6 kg (287 lb 14.7 oz) 128 kg (282 lb 3 oz)    Exam:   General:  NAD  Cardiovascular: S1, S2 RRR  Respiratory: scattered crackles  Abdomen: soft, nt, nd, bs+  Musculoskeletal: no edema b/l   Data Reviewed: Basic Metabolic Panel:  Recent Labs Lab 11/12/13 1600 11/13/13 0613  NA 137 141  K 4.4 4.3  CL 99 100  CO2 27 31  GLUCOSE 99 93  BUN 23 21  CREATININE 1.67* 1.78*  CALCIUM 9.2 9.4   Liver Function Tests:  Recent Labs Lab 11/12/13 1600  AST 21  ALT 18  ALKPHOS 73  BILITOT 0.6  PROT 7.1  ALBUMIN 3.6    Recent Labs Lab 11/12/13 1600  LIPASE 59   No results found for this basename: AMMONIA,  in the last 168 hours CBC:  Recent Labs Lab 11/12/13 1600 11/13/13 0613  WBC 10.9* 10.2  NEUTROABS 7.8*  --   HGB 15.5 14.7  HCT 46.5 45.2  MCV 94.1 95.8  PLT 148* 141*   Cardiac Enzymes:  Recent Labs Lab 11/12/13 1600  TROPONINI <0.30   BNP (last 3 results)  Recent Labs  11/12/13 1600  PROBNP 4100.0*   CBG: No results found for this basename: GLUCAP,  in the last 168 hours  No results found for this or any previous visit (  from the past 240 hour(s)).   Studies: Dg Chest 2 View  11/12/2013   CLINICAL DATA:  Short of breath. Acute renal failure. Hiatal hernia.  EXAM: CHEST  2 VIEW  COMPARISON:  06/04/2012.  FINDINGS: Cardiomegaly is present. There is engorgement of the vascular pedicle with pulmonary vascular congestion. Consolidation is present in the right lower lobe. Right pleural effusion is present with fluid tracking into the major fissure. Probable lumbar overlying emphysema. Mid thoracic compression fracture. The left lung base  appears clear. Monitoring leads project over the chest.  IMPRESSION: 1. Cardiomegaly and pulmonary vascular congestion. 2. Consolidation at the right lung base suggesting right middle and/or right lower lobe pneumonia. Asymmetric/atypical pulmonary edema is in the differential considerations. 3. Small right pleural effusion.   Electronically Signed   By: Dereck Ligas M.D.   On: 11/12/2013 17:31   Ct Chest Wo Contrast  11/12/2013   CLINICAL DATA:  Short of breath for 2 days.  EXAM: CT CHEST WITHOUT CONTRAST  TECHNIQUE: Multidetector CT imaging of the chest was performed following the standard protocol without IV contrast.  COMPARISON:  Chest radiograph 11/12/2013.  FINDINGS: Loculated right pleural effusion is present. There is a pulmonary nodule adjacent to the superior aspect of the major fissure measuring 8 mm. Pleural thickening is present within the right chest. The findings raise the possibility of neoplastic involvement of the right lung with malignant effusion. Pleural effusion is loculated in the major fissure, compatible with pseudotumor. Coronary artery atherosclerosis is present. If office based assessment of coronary risk factors has not been performed, it is now recommended. Aortic branch vessel atherosclerosis. Small free-flowing left pleural effusion is present. There is no axillary adenopathy. No mediastinal adenopathy. No gross hilar adenopathy allowing for noncontrast technique.  Incidental imaging of the upper abdomen demonstrates cirrhosis of the liver. 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 Nonspecific nephric stranding. Atherosclerosis in the abdomen. No aggressive osseous lesions are identified.  IMPRESSION: 1. Small bilateral right greater than left pleural effusions. Loculated pleural fluid is present on the right in the major fissure. Pleural thickening is present which raises the possibility of empyema or malignant effusion. 2. 8 mm pulmonary nodule adjacent to  loculated in the right major fissure. This may represent extension of fluid in the fissure however an underlying pulmonary nodule cannot be excluded. Follow-up short-term chest CT is recommended to assess for stability in two months. 3. Hepatic cirrhosis. 4. 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   Electronically Signed   By: Dereck Ligas M.D.   On: 11/12/2013 22:18    Scheduled Meds: . allopurinol  100 mg Oral Daily  . amLODipine  2.5 mg Oral Daily  . aspirin EC  81 mg Oral Daily  . atenolol  50 mg Oral Daily  . folic acid  1 mg Oral Daily  . heparin  5,000 Units Subcutaneous Q8H  . levothyroxine  25 mcg Oral QAC breakfast  . pantoprazole  80 mg Oral BID  . sodium chloride  3 mL Intravenous Q12H  . sodium chloride  3 mL Intravenous Q12H   Continuous Infusions:   Active Problems:   GERD (gastroesophageal reflux disease)   Cirrhosis   Obesity   A-fib   CKD (chronic kidney disease), stage III   Unspecified hypothyroidism   Dyspnea    Time spent: 57mns    Justin Parsons  [facility] Pager [phone] If 7PM-7AM, please contact night-coverage at [email or web address], password [email or web address]12/26/2014, 5:00 PM  LOS: 1 day

## 2013-11-13 NOTE — Progress Notes (Signed)
*  PRELIMINARY RESULTS* Echocardiogram 2D Echocardiogram has been performed.  [place], [place] 11/13/2013, 11:35 AM

## 2013-11-13 NOTE — Progress Notes (Signed)
UR chart review completed.  

## 2013-11-14 DIAGNOSIS — J189 Pneumonia, unspecified organism: Secondary | ICD-10-CM

## 2013-11-14 LAB — BASIC METABOLIC PANEL
BUN: 25 mg/dL — ABNORMAL HIGH (ref 6–23)
CO2: 38 mEq/L — ABNORMAL HIGH (ref 19–32)
Chloride: 93 mEq/L — ABNORMAL LOW (ref 96–112)
GFR calc non Af Amer: 28 mL/min — ABNORMAL LOW (ref >90)
Glucose, Bld: 103 mg/dL — ABNORMAL HIGH (ref 70–99)
Potassium: 4.4 mEq/L (ref 3.5–5.1)
Sodium: 138 mEq/L (ref 135–145)

## 2013-11-14 MED ORDER — SODIUM CHLORIDE 0.9 % IV SOLN
INTRAVENOUS | Status: DC
  Administered 2013-11-14 – 2013-11-15 (×2): via INTRAVENOUS
  Administered 2013-11-16: 1000 mL via INTRAVENOUS
  Administered 2013-11-16 – 2013-11-17 (×3): via INTRAVENOUS

## 2013-11-14 NOTE — Consult Note (Signed)
Consult requested by: Dr. Roderic Palau  Consult requested for abnormal chest CT:  HPI: This is a 72 year old who was in his usual state of poor health at home when he developed increasing shortness of breath and acute respiratory failure. He had what seemed to be new-onset atrial fibrillation. He says that normally he is not short of breath at all. He denies any fever or chills. He has not had much cough. He has not coughed up any blood. He has not lost any weight.  Past Medical History  Diagnosis Date  . HTN (hypertension)   . Arthritis   . 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 Adenomatous polyp of colon   . Hypothyroidism   . TIA (transient ischemic attack)   . Alcoholism     moonshine  . Cirrhosis of liver     no esophageal varices on EGD 01/2012  . Acute renal failure (ARF) 05/2012  . CKD (chronic kidney disease)   . 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 Hiatal hernia      Family History  Problem Relation Age of Onset  . Colon cancer Neg Hx   . Ovarian cancer Sister      History   Social History  . Marital Status: Divorced    Spouse Name: N/A    Number of Children: N/A  . Years of Education: N/A   Social History Main Topics  . Smoking status: Never Smoker   . Smokeless tobacco: None     Comment: used to chew tobacco, none in 15 years  . Alcohol Use: Yes     Comment: half a gallon in a week but nothing in about a month or so  . Drug Use: No  . Sexual Activity: None   Other Topics Concern  . None   Social History Narrative   One son deceased while in prison, drug addiction.     ROS: As noted above and otherwise negative    Objective: Vital signs in last 24 hours: Temp:  [97.5 F (36.4 C)-98.2 F (36.8 C)] 98 F (36.7 C) (12/27 0635) Pulse Rate:  [67-91] 72 (12/27 0819) Resp:  [16-20] 20 (12/27 0635) BP: (105-125)/(65-77) 105/69 mmHg (12/27 0819) SpO2:  [94 %-98 %] 96 % (12/27 0635) Weight:  [125.8 kg (277 lb 5.4 oz)] 125.8 kg (277 lb 5.4 oz) (12/27  0500) Weight change: -10.279 kg (-22 lb 10.6 oz) Last BM Date: 11/12/13  Intake/Output from previous day: 12/26 0701 - 12/27 0700 In: 483 [P.O.:480; I.V.:3] Out: 4700 [Urine:4700]  PHYSICAL EXAM He is awake and alert. He is blind in his right eye. His mucous hemorrhage are moist. His neck is supple without masses. His chest is with somewhat diminished breath sounds. His heart is regular without gallop. His abdomen is soft without masses I'm not sure if I can feel any of his organs. Extremities showed no edema except if his feet which he says is chronic. Central nervous system exam is grossly intact  Lab Results: Basic Metabolic Panel:  Recent Labs  11/13/13 0613 11/14/13 0612  NA 141 138  K 4.3 4.4  CL 100 93*  CO2 31 38*  GLUCOSE 93 103*  BUN 21 25*  CREATININE 1.78* 2.23*  CALCIUM 9.4 9.5   Liver Function Tests:  Recent Labs  11/12/13 1600  AST 21  ALT 18  ALKPHOS 73  BILITOT 0.6  PROT 7.1  ALBUMIN 3.6    Recent Labs  11/12/13 1600  LIPASE 59  No results found for this basename: AMMONIA,  in the last 72 hours CBC:  Recent Labs  11/12/13 1600 11/13/13 0613  WBC 10.9* 10.2  NEUTROABS 7.8*  --   HGB 15.5 14.7  HCT 46.5 45.2  MCV 94.1 95.8  PLT 148* 141*   Cardiac Enzymes:  Recent Labs  11/12/13 1600  TROPONINI <0.30   BNP:  Recent Labs  11/12/13 1600  PROBNP 4100.0*   D-Dimer: No results found for this basename: DDIMER,  in the last 72 hours CBG: No results found for this basename: GLUCAP,  in the last 72 hours Hemoglobin A1C:  Recent Labs  11/12/13 2219  HGBA1C 5.7*   Fasting Lipid Panel: No results found for this basename: CHOL, HDL, LDLCALC, TRIG, CHOLHDL, LDLDIRECT,  in the last 72 hours Thyroid Function Tests:  Recent Labs  11/12/13 2219  TSH 4.013   Anemia Panel: No results found for this basename: VITAMINB12, FOLATE, FERRITIN, TIBC, IRON, RETICCTPCT,  in the last 72 hours Coagulation:  Recent Labs   11/12/13 2219  LABPROT 14.4  INR 1.14   Urine Drug Screen: Drugs of Abuse  No results found for this basename: labopia, cocainscrnur, labbenz, amphetmu, thcu, labbarb    Alcohol Level: No results found for this basename: ETH,  in the last 72 hours Urinalysis:  Recent Labs  11/12/13 2315  COLORURINE YELLOW  LABSPEC <1.005*  PHURINE 5.5  GLUCOSEU NEGATIVE  HGBUR NEGATIVE  BILIRUBINUR NEGATIVE  KETONESUR NEGATIVE  PROTEINUR NEGATIVE  UROBILINOGEN 0.2  NITRITE NEGATIVE  [place]. Labs:   ABGS: No results found for this basename: PHART, PCO2, PO2ART, TCO2, HCO3,  in the last 72 hours   MICROBIOLOGY: No results found for this or any previous visit (from the past 240 hour(s)).  Studies/Results: Dg Chest 2 View  11/12/2013   CLINICAL DATA:  Short of breath. Acute renal failure. Hiatal hernia.  EXAM: CHEST  2 VIEW  COMPARISON:  06/04/2012.  FINDINGS: Cardiomegaly is present. There is engorgement of the vascular pedicle with pulmonary vascular congestion. Consolidation is present in the right lower lobe. Right pleural effusion is present with fluid tracking into the major fissure. Probable lumbar overlying emphysema. Mid thoracic compression fracture. The left lung base appears clear. Monitoring leads project over the chest.  IMPRESSION: 1. Cardiomegaly and pulmonary vascular congestion. 2. Consolidation at the right lung base suggesting right middle and/or right lower lobe pneumonia. Asymmetric/atypical pulmonary edema is in the differential considerations. 3. Small right pleural effusion.   Electronically Signed   By: Dereck Ligas M.D.   On: 11/12/2013 17:31   Ct Chest Wo Contrast  11/12/2013   CLINICAL DATA:  Short of breath for 2 days.  EXAM: CT CHEST WITHOUT CONTRAST  TECHNIQUE: Multidetector CT imaging of the chest was performed following the standard protocol without IV contrast.  COMPARISON:  Chest radiograph 11/12/2013.  FINDINGS: Loculated right  pleural effusion is present. There is a pulmonary nodule adjacent to the superior aspect of the major fissure measuring 8 mm. Pleural thickening is present within the right chest. The findings raise the possibility of neoplastic involvement of the right lung with malignant effusion. Pleural effusion is loculated in the major fissure, compatible with pseudotumor. Coronary artery atherosclerosis is present. If office based assessment of coronary risk factors has not been performed, it is now recommended. Aortic branch vessel atherosclerosis. Small free-flowing left pleural effusion is present. There is no axillary adenopathy. No mediastinal adenopathy. No gross hilar adenopathy allowing for noncontrast technique.  Incidental imaging of the upper abdomen demonstrates cirrhosis of the liver. 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 Nonspecific nephric stranding. Atherosclerosis in the abdomen. No aggressive osseous lesions are identified.  IMPRESSION: 1. Small bilateral right greater than left pleural effusions. Loculated pleural fluid is present on the right in the major fissure. Pleural thickening is present which raises the possibility of empyema or malignant effusion. 2. 8 mm pulmonary nodule adjacent to loculated in the right major fissure. This may represent extension of fluid in the fissure however an underlying pulmonary nodule cannot be excluded. Follow-up short-term chest CT is recommended to assess for stability in two months. 3. Hepatic cirrhosis. 4. 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   Electronically Signed   By: Dereck Ligas M.D.   On: 11/12/2013 22:18    Medications:  Prior to Admission:  Prescriptions prior to admission  Medication Sig Dispense Refill  . allopurinol (ZYLOPRIM) 100 MG tablet Take 100 mg by mouth daily.        Marland Kitchen amLODipine (NORVASC) 2.5 MG tablet Take 2.5 mg by mouth daily.        . Ascorbic Acid (VITAMIN C PO) Take 1 tablet by mouth daily.      Marland Kitchen  atenolol (TENORMIN) 50 MG tablet Take 50 mg by mouth Daily.      . diclofenac (VOLTAREN) 75 MG EC tablet Take 75 mg by mouth 2 (two) times daily.      . iron polysaccharides (NIFEREX) 150 MG capsule Take 150 mg by mouth daily.       Marland Kitchen levothyroxine (SYNTHROID, LEVOTHROID) 25 MCG tablet Take 25 mcg by mouth daily before breakfast.      . omeprazole (PRILOSEC) 20 MG capsule Take 20 mg by mouth 2 (two) times daily.      Marland Kitchen oxymetazoline (AFRIN) 0.05 % nasal spray Place 1 spray into both nostrils 2 (two) times daily as needed for congestion.      Marland Kitchen levothyroxine (LEVOTHROID) 25 MCG tablet Take 1 tablet (25 mcg total) by mouth daily.  30 tablet  0   Scheduled: . allopurinol  100 mg Oral Daily  . amLODipine  2.5 mg Oral Daily  . aspirin EC  81 mg Oral Daily  . atenolol  50 mg Oral Daily  . folic acid  1 mg Oral Daily  . furosemide  40 mg Intravenous Daily  . heparin  5,000 Units Subcutaneous Q8H  . levothyroxine  25 mcg Oral QAC breakfast  . pantoprazole  80 mg Oral BID  . polyethylene glycol  17 g Oral Daily  . sodium chloride  3 mL Intravenous Q12H  . sodium chloride  3 mL Intravenous Q12H   Continuous:  DQQ:IWLNLG chloride, acetaminophen, acetaminophen, HYDROmorphone (DILAUDID) injection, ondansetron (ZOFRAN) IV, oxyCODONE, sodium chloride, traZODone  Assesment: He has an abnormal chest CT with what appears to be pleural thickening/pleural fluid. He does not have any exposure to asbestos that he is aware of. He has no symptoms suggesting empyema. He is a lifelong nonsmoker. He does not know of any respiratory problems except for his acute shortness of breath that prompted this admission. Active Problems:   GERD (gastroesophageal reflux disease)   Cirrhosis   Obesity   A-fib   CKD (chronic kidney disease), stage III   Unspecified hypothyroidism   Dyspnea   Acute diastolic CHF (congestive heart failure)   Acute respiratory failure    Plan: I think I would follow this CT with another  CT in about  6 weeks. I don't see any signs that he has empyema now. If he develops fever cough congestion or starts losing weight then he would need consultation with thoracic surgery for potential VATS procedure    LOS: 2 days   Lacinda Curvin L 11/14/2013, 10:41 AM

## 2013-11-14 NOTE — Progress Notes (Signed)
[facility] PROGRESS NOTE  Justin Parsons HTD:[id number] DOB: [birth year] DOA: 11/12/2013 PCP: Monico Blitz, MD  Summary:  This patient was admitted to the hospital with shortness of breath. Initially in the emergency room was felt that he may have a community-acquired pneumonia and he received one dose of antibiotics. Upon further evaluation, an infectious etiology was felt to be less likely since he did not have any fever, cough, leukocytosis. It was noted that his BNP was significantly elevated and the patient was started on Lasix. He had a good response to diuresis and is currently -8 L. His respiratory status appears to be improving, although now he is feeling lightheaded and dizzy, and creatinine is trending up. He does appear to have chronic kidney disease stage III which is likely worsened due to overdiuresis. The patient's Lasix has been discontinued and we will start on gentle IV hydration. Repeat labs in the morning. He was also found to have an abnormal chest CT with evidence of pleural thickening. He was seen by pulmonology and it was felt that empyema would be less likely in this situation since he does not exhibit any signs of infection. Recommendations are for repeat chest CT in 6 weeks to evaluate for any changes consistent with an underlying malignancy. If the patient does have any concerning findings, he would need referral to cardiothoracic surgery. Anticipate that he'll be ready for discharge the next 24-48 hours.  Assessment/Plan: 1. Acute respiratory failure. Initially admitted for treatment of community acquired pneumonia. Since patient did not have any significant fever, WBC count or cough, pneumonia was felt to be less likely in this situation. The patient did have an elevated BNP and had a good response to Lasix. It is possible that his shortness of breath is due to volume overload. He is currently on 3 L of oxygen. We'll continue to wean down as tolerated. 2. Acute  diastolic congestive heart failure. Ejection fraction is intact on echocardiogram. Cardiac markers have been negative. Patient had elevation in creatinine likely due to overdiuresis. We'll discontinue Lasix for now.. 3. Atrial fibrillation. EKG on admission indicated fibrillation. Rate appears to be controlled. Continue on beta blocker. He does not appear appropriate candidate for anticoagulation due to his ongoing alcohol use and non compliance 4. Acute kidney injury on Chronic kidney disease stage III. creatinine is trending up blood pressure is running low on patient and feels dizzy on standing. This is all likely related to over diuresis. We'll discontinue Lasix and start gentle IV hydration.. 5. Hypothyroidism. TSH normal. 6. Abnormal CT chest. Appreciate pulmonology input. At this time, it appears less likely that this is an underlying empyema, since patient does not have any fever, cough, leukocytosis. He does not have any history of smoking, has not had any hemoptysis or weight loss. Malignancy needs to be a consideration. Recommendations are to repeat CT chest in 6 weeks to monitor for any progression. If there are any changes, he may need cardiothoracic evaluation.  Code Status: full code Family Communication: discussed with patient Disposition Plan: discharge home once improved   Consultants:  Pulmonology  Procedures: Echo: Left ventricle: LVOT is narrow There is turbulent flow through the LVOT but no significant outflow obstruction at rest. The cavity size was normal. Wall thickness was increased in a pattern of severe LVH. Systolic function was normal. The estimated ejection fraction was in the range of 60% to 65%.     Antibiotics:  none  HPI/Subjective: Does not feel well today. Feels weak. Dizzy on  standing.  Objective: Filed Vitals:   11/14/13 1750  BP: 101/66  Pulse: 87  Temp: 98.2 F (36.8 C)  Resp: 20    Intake/Output Summary (Last 24 hours) at 11/14/13  1850 Last data filed at 11/14/13 1754  Gross per 24 hour  Intake    480 ml  Output   3200 ml  Net  -2720 ml   Filed Weights   11/12/13 1900 11/13/13 0554 11/14/13 0500  Weight: 130.6 kg (287 lb 14.7 oz) 128 kg (282 lb 3 oz) 125.8 kg (277 lb 5.4 oz)    Exam:   General:  NAD  Cardiovascular: S1, S2 RRR  Respiratory: Clear to auscultation bilaterally  Abdomen: soft, nt, nd, bs+  Musculoskeletal: no edema b/l   Data Reviewed: Basic Metabolic Panel:  Recent Labs Lab 11/12/13 1600 11/13/13 0613 11/14/13 0612  NA 137 141 138  K 4.4 4.3 4.4  CL 99 100 93*  CO2 27 31 38*  GLUCOSE 99 93 103*  BUN 23 21 25*  CREATININE 1.67* 1.78* 2.23*  CALCIUM 9.2 9.4 9.5   Liver Function Tests:  Recent Labs Lab 11/12/13 1600  AST 21  ALT 18  ALKPHOS 73  BILITOT 0.6  PROT 7.1  ALBUMIN 3.6    Recent Labs Lab 11/12/13 1600  LIPASE 59   No results found for this basename: AMMONIA,  in the last 168 hours CBC:  Recent Labs Lab 11/12/13 1600 11/13/13 0613  WBC 10.9* 10.2  NEUTROABS 7.8*  --   HGB 15.5 14.7  HCT 46.5 45.2  MCV 94.1 95.8  PLT 148* 141*   Cardiac Enzymes:  Recent Labs Lab 11/12/13 1600  TROPONINI <0.30   BNP (last 3 results)  Recent Labs  11/12/13 1600  PROBNP 4100.0*   CBG: No results found for this basename: GLUCAP,  in the last 168 hours  No results found for this or any previous visit (from the past 240 hour(s)).   Studies: Ct Chest Wo Contrast  11/12/2013   CLINICAL DATA:  Short of breath for 2 days.  EXAM: CT CHEST WITHOUT CONTRAST  TECHNIQUE: Multidetector CT imaging of the chest was performed following the standard protocol without IV contrast.  COMPARISON:  Chest radiograph 11/12/2013.  FINDINGS: Loculated right pleural effusion is present. There is a pulmonary nodule adjacent to the superior aspect of the major fissure measuring 8 mm. Pleural thickening is present within the right chest. The findings raise the possibility of  neoplastic involvement of the right lung with malignant effusion. Pleural effusion is loculated in the major fissure, compatible with pseudotumor. Coronary artery atherosclerosis is present. If office based assessment of coronary risk factors has not been performed, it is now recommended. Aortic branch vessel atherosclerosis. Small free-flowing left pleural effusion is present. There is no axillary adenopathy. No mediastinal adenopathy. No gross hilar adenopathy allowing for noncontrast technique.  Incidental imaging of the upper abdomen demonstrates cirrhosis of the liver. 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 Nonspecific nephric stranding. Atherosclerosis in the abdomen. No aggressive osseous lesions are identified.  IMPRESSION: 1. Small bilateral right greater than left pleural effusions. Loculated pleural fluid is present on the right in the major fissure. Pleural thickening is present which raises the possibility of empyema or malignant effusion. 2. 8 mm pulmonary nodule adjacent to loculated in the right major fissure. This may represent extension of fluid in the fissure however an underlying pulmonary nodule cannot be excluded. Follow-up short-term chest CT is recommended to assess for  stability in two months. 3. Hepatic cirrhosis. 4. 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   Electronically Signed   By: Dereck Ligas M.D.   On: 11/12/2013 22:18    Scheduled Meds: . allopurinol  100 mg Oral Daily  . amLODipine  2.5 mg Oral Daily  . aspirin EC  81 mg Oral Daily  . atenolol  50 mg Oral Daily  . folic acid  1 mg Oral Daily  . heparin  5,000 Units Subcutaneous Q8H  . levothyroxine  25 mcg Oral QAC breakfast  . pantoprazole  80 mg Oral BID  . polyethylene glycol  17 g Oral Daily  . sodium chloride  3 mL Intravenous Q12H  . sodium chloride  3 mL Intravenous Q12H   Continuous Infusions:   Active Problems:   GERD (gastroesophageal reflux disease)   Cirrhosis   Obesity    A-fib   CKD (chronic kidney disease), stage III   Unspecified hypothyroidism   Dyspnea   Acute diastolic CHF (congestive heart failure)   Acute respiratory failure    Time spent: 76mns    Ferlin Fairhurst  [facility] Pager [phone] If 7PM-7AM, please contact night-coverage at [email or web address], password [email or web address]12/27/2014, 6:50 PM  LOS: 2 days

## 2013-11-15 DIAGNOSIS — N179 Acute kidney failure, unspecified: Secondary | ICD-10-CM

## 2013-11-15 LAB — BASIC METABOLIC PANEL
CO2: 35 mEq/L — ABNORMAL HIGH (ref 19–32)
Calcium: 9 mg/dL (ref 8.4–10.5)
Chloride: 92 mEq/L — ABNORMAL LOW (ref 96–112)
GFR calc non Af Amer: 28 mL/min — ABNORMAL LOW (ref >90)
Sodium: 135 mEq/L (ref 135–145)

## 2013-11-15 LAB — CBC
MCV: 95.9 fL (ref 78.0–100.0)
Platelets: 142 10*3/uL — ABNORMAL LOW (ref 150–400)
RBC: 4.93 MIL/uL (ref 4.22–5.81)
WBC: 12.4 10*3/uL — ABNORMAL HIGH (ref 4.0–10.5)

## 2013-11-15 MED ORDER — DICLOFENAC SODIUM 75 MG PO TBEC
DELAYED_RELEASE_TABLET | ORAL | Status: AC
  Filled 2013-11-15: qty 1

## 2013-11-15 MED ORDER — POLYETHYLENE GLYCOL 3350 17 G PO PACK
17.0000 g | PACK | Freq: Two times a day (BID) | ORAL | Status: DC
  Administered 2013-11-15 – 2013-11-18 (×6): 17 g via ORAL
  Filled 2013-11-15 (×6): qty 1

## 2013-11-15 MED ORDER — DOCUSATE SODIUM 100 MG PO CAPS
100.0000 mg | ORAL_CAPSULE | Freq: Two times a day (BID) | ORAL | Status: DC
  Administered 2013-11-15 – 2013-11-18 (×6): 100 mg via ORAL
  Filled 2013-11-15 (×6): qty 1

## 2013-11-15 MED ORDER — DICLOFENAC SODIUM 75 MG PO TBEC
75.0000 mg | DELAYED_RELEASE_TABLET | Freq: Two times a day (BID) | ORAL | Status: DC
  Administered 2013-11-15: 75 mg via ORAL
  Filled 2013-11-15 (×2): qty 1

## 2013-11-15 NOTE — Progress Notes (Signed)
[facility] PROGRESS NOTE  SHELTON SQUARE EUM:[id number] DOB: 06-22-1941 DOA: 11/12/2013 PCP: Monico Blitz, MD  Assessment/Plan: 1. Acute hypoxic respiratory failure: Appears to be resolving, likely secondary to acute diastolic congestive heart failure. No clinical evidence of pneumonia as detailed below. 2. Acute diastolic congestive heart failure: Appears clinically resolved with diuresis. 3. New diagnosis of atrial fibrillation, rate controlled. Continue beta blocker. Not a candidate for anticoagulation at this point secondary to alcoholism. 4. Acute respiratory failure, initially treated for pneumonia, however no fever, leukocytosis, cough and pneumonia is felt unlikely, antibiotics were not continued. Elevated BNP was noted and patient was treated with Lasix with good effect. 5. CT of the chest revealed possible empyema versus underlying malignancy. Pulmonology doubts empyema or infection. Repeat CT suggested in 6 weeks. It develops symptoms of fever or infection, consider thoracic surgery consultation. 6. Acute renal failure superimposed on Chronic kidney disease stage III secondary to overdiuresis  7. Hypothyroidism: TSH normal 8. Alcoholism, cirrhosis of the liver chronic thrombocytopenia, secondary to alcoholism 9.  osteoarthritis, maintainedl on Voltaren for many years   Wean oxygen  Resume Voltaren as benefit felt to outweigh the risk at this point  BMP in AM  Pending studies:   none  Code Status: full code DVT prophylaxis: heparin subq Family Communication: none present  Disposition Plan: home  [name], MD  [facility]  Pager [phone] If 7PM-7AM, please contact night-coverage at [email or web address], password [facility] 11/15/2013, 2:59 PM  LOS: 3 days   Summary: 72 year old man presented with shortness of breath, initially felt to have pneumonia, subsequent investigation was more consistent with CHF. History with diuretics with significant volume  output. He developed evidence of acute renal failure likely from overdiuresis.  Consultants:  Pulmonology  Procedures:  2-D echocardiogram: Severe LVH. Left ventricular ejection fraction 60-65%.  Antibiotics:    HPI/Subjective: Overall doing okay, complains of severe arthritis pain off of Voltaren. Seems to be breathing okay.  Objective: Filed Vitals:   11/14/13 1750 11/14/13 2152 11/15/13 0828 11/15/13 1016  BP: 101/66 102/67 112/76   Pulse: 87 84 91   Temp: 98.2 F (36.8 C) 98.8 F (37.1 C)    TempSrc: Oral Oral    Resp: 20 20    Height:      Weight:      SpO2: 92% 94%  95%    Intake/Output Summary (Last 24 hours) at 11/15/13 1459 Last data filed at 11/15/13 0500  Gross per 24 hour  Intake  847.5 ml  Output    600 ml  Net  247.5 ml     Filed Weights   11/12/13 1900 11/13/13 0554 11/14/13 0500  Weight: 130.6 kg (287 lb 14.7 oz) 128 kg (282 lb 3 oz) 125.8 kg (277 lb 5.4 oz)    Exam:   Afebrile, vital signs stable. O2 95% on 2 L  General: Appears calm and comfortable. Speech fluent and clear.  Cardiovascular: Regular rate and rhythm. No murmur, rub or gallop. 1+ bilateral lower extremity edema.  Respiratory: Clear to auscultation bilaterally. No wheezes, rales or rhonchi. Normal respiratory effort.  Telemetry atrial fibrillation, rate controlled  Psychiatric: Grossly normal mood and affect. Speech fluent and appropriate.  Data Reviewed:  Creatinine without significant change, 2.19, was 1.67 on admission  Scheduled Meds: . allopurinol  100 mg Oral Daily  . amLODipine  2.5 mg Oral Daily  . aspirin EC  81 mg Oral Daily  . atenolol  50 mg Oral Daily  . folic acid  1 mg Oral  Daily  . heparin  5,000 Units Subcutaneous Q8H  . levothyroxine  25 mcg Oral QAC breakfast  . pantoprazole  80 mg Oral BID  . polyethylene glycol  17 g Oral Daily  . sodium chloride  3 mL Intravenous Q12H  . sodium chloride  3 mL Intravenous Q12H   Continuous Infusions: .  sodium chloride 75 mL/hr at 11/15/13 2241    Active Problems:   GERD (gastroesophageal reflux disease)   Cirrhosis   Obesity   A-fib   CKD (chronic kidney disease), stage III   Unspecified hypothyroidism   Dyspnea   Acute diastolic CHF (congestive heart failure)   Acute respiratory failure   Time spent 20 minutes

## 2013-11-16 LAB — BASIC METABOLIC PANEL
CO2: 30 mEq/L (ref 19–32)
Chloride: 93 mEq/L — ABNORMAL LOW (ref 96–112)
Creatinine, Ser: 2.62 mg/dL — ABNORMAL HIGH (ref 0.50–1.35)
Potassium: 4 mEq/L (ref 3.5–5.1)
Sodium: 134 mEq/L — ABNORMAL LOW (ref 135–145)

## 2013-11-16 MED ORDER — BIOTENE DRY MOUTH MT LIQD
15.0000 mL | Freq: Two times a day (BID) | OROMUCOSAL | Status: DC
  Administered 2013-11-16 – 2013-11-18 (×5): 15 mL via OROMUCOSAL

## 2013-11-16 NOTE — Progress Notes (Signed)
Patient requesting arthritis medication this am,while I was passing am medications,patient informed that I would notify the  MD. Patient was very agitated,and speaking in a loud tone,again I reassured him that I would notify MD. Dr Sarajane Jews was notified. Will continue to monitor patient.

## 2013-11-16 NOTE — Care Management Note (Addendum)
    Page 1 of 1   11/17/2013     4:21:56 PM   CARE MANAGEMENT NOTE 11/17/2013  Patient:  Justin Parsons, Justin Parsons   Account Number:  [id number]  Date Initiated:  11/16/2013  Documentation initiated by:  Claretha Cooper  Subjective/Objective Assessment:   pt lives at home alone. Declines HH services. Upset about his arthritis medication being withheld.     Action/Plan:   Address pt's issue with MD. MD will be talking to pt about the complication of the medication with his renal function.   Anticipated DC Date:  11/17/2013   Anticipated DC Plan:  [place]  CM consult      Choice offered to / List presented to:             Status of service:  Completed, signed off Medicare Important Message given?  YES (If response is "NO", the following Medicare IM given date fields will be blank) Date Medicare IM given:  11/17/2013 Date Additional Medicare IM given:    Discharge Disposition:  HOME/SELF CARE  Per UR Regulation:    If discussed at Long Length of Stay Meetings, dates discussed:    Comments:  11/16/13 Claretha Cooper RN BSN CM  11/17/13 Amad Mau Dellia Nims RN BSN CM

## 2013-11-16 NOTE — Evaluation (Signed)
Physical Therapy Evaluation Patient Details Name: Justin Parsons MRN: [id number] DOB: Jan 04, 1941 Today's Date: 11/16/2013 Time: 2992-4268 PT Time Calculation (min): 35 min  PT Assessment / Plan / Recommendation History of Present Illness  72 year old man presented with shortness of breath, initially felt to have pneumonia, subsequent investigation was more consistent with CHF. History with diuretics with significant volume output. He developed evidence of acute renal failure likely from overdiuresis.   Clinical Impression  Pt is a 72 year old male referred to PT for impairments listed below.  At this time pt is frustrated with lack of pain medications and feels his feet are swollen and he has increased pain.  He feels that he is able to walk and do many of his activities at home with a little assistance from his friend and has had past HHPT.  He has he needed assistive devices and elevated surfaces at home.     PT Assessment  Patient needs continued PT services    Follow Up Recommendations  Home health PT (Patient states he refuses this service, I still recommend)    Does the patient have the potential to tolerate intense rehabilitation      Barriers to Discharge   Pt refuses HHPT at home due to privacy concerns.     Equipment Recommendations  None recommended by PT    Recommendations for Other Services     Frequency Min 3X/week    Precautions / Restrictions     Pertinent Vitals/Pain Pain: FACES: 6/10 to BLE      Mobility  Bed Mobility Bed Mobility: Rolling Left;Right Sidelying to Sit Rolling Left: 6: Modified independent (Device/Increase time);With rail Right Sidelying to Sit: With rails;HOB elevated;4: Min assist Details for Bed Mobility Assistance: will only get out on the Left side due to previous injuries Transfers Transfers: Sit to Stand;Stand to Sit Sit to Stand: 4: Min guard;With upper extremity assist Stand to Sit: 4: Min guard;With upper extremity  assist Ambulation/Gait Ambulation/Gait Assistance: 4: Min guard Ambulation Distance (Feet): 100 Feet Assistive device: Rolling walker Gait Pattern: Antalgic;Shuffle Gait velocity: decreased    Exercises     PT Diagnosis: Difficulty walking;Generalized weakness;Acute pain  PT Problem List: Decreased strength;Decreased mobility;Decreased activity tolerance;Pain PT Treatment Interventions: Gait training;Stair training;Therapeutic activities;Therapeutic exercise     PT Goals(Current goals can be found in the care plan section) Acute Rehab PT Goals Patient Stated Goal: "[place] farm.  I don't want HHPT.  I want cooperation and getting my pain medication so I can walk better and get around myself.  I have had HHPT for 6 months in the past and I know what i need to do.  I don't want anyone on my property." PT Goal Formulation: With patient Time For Goal Achievement: 11/23/13 Potential to Achieve Goals: Good  Visit Information  Last PT Received On: 11/16/13 History of Present Illness: Pt admitted to [facility] for SOB.  At this time has declinced [facility] PT services at this time.        Prior [place] expects to be discharged to:: Private residence Living Arrangements: Alone Available Help at Discharge: Friend(s) Type of Home: House Home Access: Stairs to enter [employer] of Steps: 2 Home Layout: One level Home Equipment: Environmental consultant - 2 wheels Prior Function Level of Independence: Independent with assistive device(s) Communication Communication: No difficulties    Cognition       Extremity/Trunk Assessment Lower Extremity Assessment Lower Extremity Assessment: Generalized weakness   Balance  End of Session PT - End of Session Equipment Utilized During Treatment: Gait belt Activity Tolerance: Patient limited by pain Patient left: in bed;with family/visitor present;with call bell/phone within reach Nurse Communication: Mobility  status;Patient requests pain meds  GP     Esha Fincher, MPT, ATC 11/16/2013, 5:10 PM

## 2013-11-16 NOTE — Progress Notes (Signed)
[facility] PROGRESS NOTE  Justin Parsons [id number] DOB: 1941/10/03 DOA: 11/12/2013 PCP: Monico Blitz, MD  Assessment/Plan: 1. Acute renal failure superimposed on chronic kidney disease stage III secondary to overdiuresis, worse today, likely because patient has not received ordered IV fluids since last night secondary to infiltration of IV. 2. Acute hypoxic respiratory failure: Appears to be resolving, likely secondary to acute diastolic congestive heart failure. No clinical evidence of pneumonia as detailed below. Wean oxygen. 3. Acute diastolic congestive heart failure: Appears clinically resolved with diuresis. 4. New diagnosis of atrial fibrillation, rate controlled. Continue beta blocker. Not a candidate for anticoagulation at this point secondary to alcoholism. 5. Acute respiratory failure, initially treated for pneumonia, however no fever, leukocytosis, cough and pneumonia is felt unlikely, antibiotics were not continued. Elevated BNP was noted and patient was treated with Lasix with good effect. 6. CT of the chest revealed possible empyema versus underlying malignancy. Pulmonology doubted empyema or infection. Repeat CT suggested in 6 weeks. If develops symptoms of fever or infection, consider thoracic surgery consultation. 7. Hypothyroidism: TSH normal 8. Alcoholism, cirrhosis of the liver chronic thrombocytopenia, secondary to alcoholism 9. Osteoarthritis, maintained on Voltaren for many years   Place IV  Restart IV fluids  Wean oxygen  Hold Voltaren  Pending studies:   none  Code Status: full code DVT prophylaxis: heparin subq Family Communication: Discussed with friend at bedside Disposition Plan: home  Justin Hodgkins, MD  [facility]  Pager [phone] If 7PM-7AM, please contact night-coverage at [email or web address], password [facility] 11/16/2013, 4:33 PM  LOS: 4 days   Summary: 72 year old man presented with shortness of breath, initially felt to have  pneumonia, subsequent investigation was more consistent with CHF. History with diuretics with significant volume output. He developed evidence of acute renal failure likely from overdiuresis.  Consultants:  Pulmonology  Procedures:  2-D echocardiogram: Severe LVH. Left ventricular ejection fraction 60-65%.  Antibiotics:    HPI/Subjective: Complains of chronic arthritis pain and shoulder and legs. No new issues. Voiding. Eating well. No complaints. IV infiltrated overnight, no IV fluids since last night. Physician was not notified. Has received no IV fluids today.  Objective: Filed Vitals:   11/15/13 1558 11/16/13 0224 11/16/13 0656 11/16/13 1500  BP: 120/77 96/68 90/64  114/80  Pulse: 88 73 84 89  Temp: 98.6 F (37 C) 97.6 F (36.4 C) 97.3 F (36.3 C) 97.6 F (36.4 C)  TempSrc: Oral Oral Oral Oral  Resp: 20 20 20 20   Height:      Weight:   129.4 kg (285 lb 4.4 oz)   SpO2: 96% 95% 91% 95%    Intake/Output Summary (Last 24 hours) at 11/16/13 1633 Last data filed at 11/16/13 0656  Gross per 24 hour  Intake 1190.83 ml  Output    850 ml  Net 340.83 ml     Filed Weights   11/13/13 0554 11/14/13 0500 11/16/13 0656  Weight: 128 kg (282 lb 3 oz) 125.8 kg (277 lb 5.4 oz) 129.4 kg (285 lb 4.4 oz)    Exam:   Afebrile, vital signs stable. O2 95% on 2 L this AM.  General: Appears calm and comfortable sitting on the side of the bed.  Cardiovascular: Regular rate and rhythm. No murmur, rub or gallop. 1+ bilateral lower extremity edema.  Respiratory: Clear to auscultation bilaterally. No wheezes, rales or rhonchi. Normal respiratory effort.  Abdomen: Soft, nontender, nondistended.  Data Reviewed:  BUN 25 >> 30 >> 39  Creatinine 2.23 >> 2.19 >> 2.62  Scheduled Meds: .  allopurinol  100 mg Oral Daily  . amLODipine  2.5 mg Oral Daily  . antiseptic oral rinse  15 mL Mouth Rinse BID  . aspirin EC  81 mg Oral Daily  . atenolol  50 mg Oral Daily  . docusate sodium  100  mg Oral BID  . folic acid  1 mg Oral Daily  . heparin  5,000 Units Subcutaneous Q8H  . levothyroxine  25 mcg Oral QAC breakfast  . pantoprazole  80 mg Oral BID  . polyethylene glycol  17 g Oral BID  . sodium chloride  3 mL Intravenous Q12H  . sodium chloride  3 mL Intravenous Q12H   Continuous Infusions: . sodium chloride Stopped (11/16/13 0500)    Active Problems:   GERD (gastroesophageal reflux disease)   Cirrhosis   Obesity   A-fib   CKD (chronic kidney disease), stage III   Unspecified hypothyroidism   Dyspnea   Acute diastolic CHF (congestive heart failure)   Acute respiratory failure   Time spent 20 minutes

## 2013-11-17 DIAGNOSIS — R45851 Suicidal ideations: Secondary | ICD-10-CM

## 2013-11-17 DIAGNOSIS — N179 Acute kidney failure, unspecified: Secondary | ICD-10-CM

## 2013-11-17 LAB — BASIC METABOLIC PANEL
CO2: 29 mEq/L (ref 19–32)
Chloride: 95 mEq/L — ABNORMAL LOW (ref 96–112)
Creatinine, Ser: 2.1 mg/dL — ABNORMAL HIGH (ref 0.50–1.35)
Glucose, Bld: 101 mg/dL — ABNORMAL HIGH (ref 70–99)
Potassium: 4.5 mEq/L (ref 3.7–5.3)
Sodium: 135 mEq/L — ABNORMAL LOW (ref 137–147)

## 2013-11-17 MED ORDER — LORAZEPAM 2 MG/ML IJ SOLN
1.0000 mg | Freq: Once | INTRAMUSCULAR | Status: AC
  Administered 2013-11-17: 1 mg via INTRAVENOUS
  Filled 2013-11-17: qty 1

## 2013-11-17 MED ORDER — DICLOFENAC SODIUM 75 MG PO TBEC
75.0000 mg | DELAYED_RELEASE_TABLET | Freq: Two times a day (BID) | ORAL | Status: DC
  Administered 2013-11-17 – 2013-11-18 (×2): 75 mg via ORAL
  Filled 2013-11-17 (×6): qty 1

## 2013-11-17 MED ORDER — DICLOFENAC SODIUM 25 MG PO TBEC
25.0000 mg | DELAYED_RELEASE_TABLET | Freq: Two times a day (BID) | ORAL | Status: DC
  Administered 2013-11-17: 25 mg via ORAL
  Filled 2013-11-17 (×3): qty 1

## 2013-11-17 NOTE — Progress Notes (Signed)
[facility] PROGRESS NOTE  Justin Parsons [id number] DOB: 07-Mar-1941 DOA: 11/12/2013 PCP: Justin Blitz, MD  Assessment/Plan: 1. Suicidal ideation: This seems to be secondary to arthritis pain, a detailed discussion with him and he was adamant that he would kill himself if he could. 2. Acute renal failure superimposed on chronic kidney disease stage III secondary to overdiuresis, nearly resolved with IV fluids, very close to baseline. 3. Acute hypoxic respiratory failure: Resolved, likely secondary to acute diastolic congestive heart failure. No clinical evidence of pneumonia as detailed below. Weaned off oxygen. 4. Acute diastolic congestive heart failure: Resolved. Compensated. 5. New diagnosis of atrial fibrillation, rate controlled. Continue beta blocker. Not a candidate for anticoagulation at this point secondary to alcoholism. 6. Acute respiratory failure, initially treated for pneumonia, however no fever, leukocytosis, cough and pneumonia is felt unlikely, antibiotics were not continued. Elevated BNP was noted and patient was treated with Lasix with good effect. 7. CT of the chest revealed possible empyema versus underlying malignancy. Pulmonology doubted empyema or infection. Repeat CT suggested in 6 weeks. If develops symptoms of fever or infection, consider thoracic surgery consultation. This was discussed with the patient who is well aware of the findings. 8. Hypothyroidism: TSH normal 9. Alcoholism, cirrhosis of the liver chronic thrombocytopenia, secondary to alcoholism 10. Osteoarthritis, maintained on Voltaren for many years   Continue IV fluids.  Restart Voltaren given risk/benefit. He has been on this medication for many years and has been the only medication as well.  Psychiatry consult. Continue Air cabin crew.  Likely medically stable 12/31 for disposition as recommended by psychiatry  Pending studies:   none  Code Status: full code DVT prophylaxis:  heparin subq Family Communication: Discussed with friend at bedside Disposition Plan: home  Justin Hodgkins, MD  [facility]  Pager [phone] If 7PM-7AM, please contact night-coverage at [email or web address], password [facility] 11/17/2013, 11:27 AM  LOS: 5 days   Summary: 72 year old man presented with shortness of breath, initially felt to have pneumonia, subsequent investigation was more consistent with CHF. History with diuretics with significant volume output. He developed evidence of acute renal failure likely from overdiuresis.  Consultants:  Pulmonology  Physical therapy recommended home health, patient refuses.  Procedures:  2-D echocardiogram: Severe LVH. Left ventricular ejection fraction 60-65%.  Antibiotics:    HPI/Subjective: Overnight he reported to the nurse that he would kill himself because of arthritis pain. Safety sitter was placed at bedside. This morning he complains of chronic arthritis pain, especially left rest, bilateral feet. No pain in his knees right now. Complains of whole body aches. Poor appetite. He tells me that he would kill himself if he could.  Objective: Filed Vitals:   11/16/13 1500 11/16/13 2224 11/17/13 0500 11/17/13 0642  BP: 114/80 116/72  120/75  Pulse: 89 74  83  Temp: 97.6 F (36.4 C) 98.5 F (36.9 C)  98.7 F (37.1 C)  TempSrc: Oral Oral  Oral  Resp: 20 20  20   Height:      Weight:   127.3 kg (280 lb 10.3 oz)   SpO2: 95% 92%  90%    Intake/Output Summary (Last 24 hours) at 11/17/13 1127 Last data filed at 11/17/13 0820  Gross per 24 hour  Intake      0 ml  Output   1850 ml  Net  -1850 ml     Filed Weights   11/14/13 0500 11/16/13 0656 11/17/13 0500  Weight: 125.8 kg (277 lb 5.4 oz) 129.4 kg (285 lb 4.4 oz) 127.3 kg (  280 lb 10.3 oz)    Exam:   Afebrile, vital signs stable. O2 90% on room air.  Cardiovascular: Regular rate and rhythm. No murmur, rub or gallop.  Respiratory: Clear to auscultation bilaterally. No  wheezes, rales or rhonchi. Normal respiratory effort.  General: Appears depressed, nontoxic.   Psychiatric: Alert, oriented to hospital, self, year, said January, president.  Abdomen: Soft, nontender, nondistended.  Skin: No rash or induration seen.  Musculoskeletal: Moves all extremities well. There is no swelling of the left wrist, bilateral knees or feet. The skin of his feet appear unremarkable. Feet are warm and dry. No joint effusions are seen.  Data Reviewed:  BUN 25 >> 30 >> 39 >> 32  Creatinine 2.23 >> 2.19 >> 2.62 >> 2.1  Scheduled Meds: . allopurinol  100 mg Oral Daily  . amLODipine  2.5 mg Oral Daily  . antiseptic oral rinse  15 mL Mouth Rinse BID  . aspirin EC  81 mg Oral Daily  . atenolol  50 mg Oral Daily  . docusate sodium  100 mg Oral BID  . folic acid  1 mg Oral Daily  . heparin  5,000 Units Subcutaneous Q8H  . levothyroxine  25 mcg Oral QAC breakfast  . pantoprazole  80 mg Oral BID  . polyethylene glycol  17 g Oral BID  . sodium chloride  3 mL Intravenous Q12H   Continuous Infusions: . sodium chloride 100 mL/hr at 11/17/13 0840    Principal Problem:   Acute renal failure Active Problems:   GERD (gastroesophageal reflux disease)   Cirrhosis   Obesity   A-fib   CKD (chronic kidney disease), stage III   Unspecified hypothyroidism   Dyspnea   Acute diastolic CHF (congestive heart failure)   Acute respiratory failure   Suicidal ideation   Time spent 25 minutes

## 2013-11-17 NOTE — Consult Note (Signed)
Telepsych Consultation   Reason for Consult:  Discharge disposition  Referring Physician: Dr. Darliss Cheney is an 72 y.o. male.  Assessment: AXIS I:  Alcohol Abuse AXIS II:  Deferred AXIS III:   Past Medical History  Diagnosis Date  . HTN (hypertension)   . Arthritis   . 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 Adenomatous polyp of colon   . Hypothyroidism   . TIA (transient ischemic attack)   . Alcoholism     moonshine  . Cirrhosis of liver     no esophageal varices on EGD 01/2012  . Acute renal failure (ARF) 05/2012  . CKD (chronic kidney disease)   . 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 Hiatal hernia    AXIS IV:  other psychosocial or environmental problems AXIS V:  51-60 moderate symptoms  Plan:  No evidence of imminent risk to self or others at present.   Patient does not meet criteria for psychiatric inpatient admission. Supportive therapy provided about ongoing stressors. Discussed crisis plan, support from social network, calling 911, coming to the Emergency Department, and calling Suicide Hotline.  Subjective:   Justin Parsons is a 72 y.o. male patient admitted with shortness of breath for two days.   HPI: Justin Parsons is a 72 year old male who was admitted to [facility] after presenting complaining of shortness of breath. Patient was ruled out to have pneumonia but EKG showed atrial fibrillation. The patient has been upset over his arthritis pain that has increased and is focused on this complaint now. Today the patient made a suicidal comment to the MD but disputes he had any serious intent stating "I hurt really bad. It had gotten out of control. You would say that too if you knew anything about pain. I love myself. Never have hurt myself. I don't have any mental health problems. Came in because I could not breathe and then could not get my pain medication like I take it. I drink some moonshine every day. I don't have any withdrawals and have  never been in rehab either. I am not crazy." The patient denies feeling depressed, hopeless, anxious, paranoid, or that he ever has experienced psychosis. He denies any prior mental health history. The patient throughout the assessment continued to ruminate about the high level that his pain reached today.   HPI Elements:   Location:  [facility]. Quality:  Several active medical problems. . Severity:  Moderate . Timing:  Last few days . Duration:  Long history of alcohol abuse. . Context:  New onset cardiac problems, chronic substanc abuse, arthritis pain.  Past Psychiatric History: Past Medical History  Diagnosis Date  . HTN (hypertension)   . Arthritis   . 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 Adenomatous polyp of colon   . Hypothyroidism   . TIA (transient ischemic attack)   . Alcoholism     moonshine  . Cirrhosis of liver     no esophageal varices on EGD 01/2012  . Acute renal failure (ARF) 05/2012  . CKD (chronic kidney disease)   . 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 Hiatal hernia     reports that he has never smoked. He does not have any smokeless tobacco history on file. He reports that he drinks alcohol. He reports that he does not use illicit drugs. Family History  Problem Relation Age of Onset  . Colon cancer Neg Hx   . Ovarian cancer  Sister      Living Arrangements: Alone   Allergies:  No Known Allergies  ACT Assessment Complete:  Yes:    Educational Status    Risk to Self: Risk to self Is patient at risk for suicide?: No Substance abuse history and/or treatment for substance abuse?: No  Risk to Others:    Abuse: Abuse/Neglect Assessment (Assessment to be complete while patient is alone) Physical Abuse: Denies Verbal Abuse: Denies Sexual Abuse: Denies Exploitation of patient/patient's resources: Denies Self-Neglect: Denies  Prior Inpatient Therapy:    Prior Outpatient Therapy:    Additional Information:                    Objective: Blood  pressure 120/75, pulse 83, temperature 98.7 F (37.1 C), temperature source Oral, resp. rate 20, height 5' 9"  (1.753 m), weight 127.3 kg (280 lb 10.3 oz), SpO2 90.00%.Body mass index is 41.43 kg/(m^2). Results for orders placed during the hospital encounter of 11/12/13 (from the past 72 hour(s))  BASIC METABOLIC PANEL     Status: Abnormal   Collection Time    11/15/13  6:29 AM      Result Value Range   Sodium 135  135 - 145 mEq/L   Potassium 4.2  3.5 - 5.1 mEq/L   Chloride 92 (*) 96 - 112 mEq/L   CO2 35 (*) 19 - 32 mEq/L   Glucose, Bld 117 (*) 70 - 99 mg/dL   BUN 30 (*) 6 - 23 mg/dL   Creatinine, Ser 2.19 (*) 0.50 - 1.35 mg/dL   Calcium 9.0  8.4 - 10.5 mg/dL   GFR calc non Af Amer 28 (*) >90 mL/min   GFR calc Af Amer 33 (*) >90 mL/min   Comment: (NOTE)     The eGFR has been calculated using the CKD EPI equation.     This calculation has not been validated in all clinical situations.     eGFR's persistently <90 mL/min signify possible Chronic Kidney     Disease.  CBC     Status: Abnormal   Collection Time    11/15/13  6:29 AM      Result Value Range   WBC 12.4 (*) 4.0 - 10.5 K/uL   RBC 4.93  4.22 - 5.81 MIL/uL   Hemoglobin 15.5  13.0 - 17.0 g/dL   HCT 47.3  39.0 - 52.0 %   MCV 95.9  78.0 - 100.0 fL   MCH 31.4  26.0 - 34.0 pg   MCHC 32.8  30.0 - 36.0 g/dL   RDW 14.8  11.5 - 15.5 %   Platelets 142 (*) 150 - 400 K/uL  BASIC METABOLIC PANEL     Status: Abnormal   Collection Time    11/16/13  5:35 AM      Result Value Range   Sodium 134 (*) 135 - 145 mEq/L   Potassium 4.0  3.5 - 5.1 mEq/L   Chloride 93 (*) 96 - 112 mEq/L   CO2 30  19 - 32 mEq/L   Glucose, Bld 139 (*) 70 - 99 mg/dL   BUN 39 (*) 6 - 23 mg/dL   Creatinine, Ser 2.62 (*) 0.50 - 1.35 mg/dL   Calcium 8.9  8.4 - 10.5 mg/dL   GFR calc non Af Amer 23 (*) >90 mL/min   GFR calc Af Amer 26 (*) >90 mL/min   Comment: (NOTE)     The eGFR has been calculated using the CKD EPI equation.     This  calculation has not been  validated in all clinical situations.     eGFR's persistently <90 mL/min signify possible Chronic Kidney     Disease.  BASIC METABOLIC PANEL     Status: Abnormal   Collection Time    11/17/13  5:38 AM      Result Value Range   Sodium 135 (*) 137 - 147 mEq/L   Potassium 4.5  3.7 - 5.3 mEq/L   Chloride 95 (*) 96 - 112 mEq/L   CO2 29  19 - 32 mEq/L   Glucose, Bld 101 (*) 70 - 99 mg/dL   BUN 32 (*) 6 - 23 mg/dL   Creatinine, Ser 2.10 (*) 0.50 - 1.35 mg/dL   Calcium 8.8  8.4 - 10.5 mg/dL   GFR calc non Af Amer 30 (*) >90 mL/min   GFR calc Af Amer 34 (*) >90 mL/min   Comment: (NOTE)     The eGFR has been calculated using the CKD EPI equation.     This calculation has not been validated in all clinical situations.     eGFR's persistently <90 mL/min signify possible Chronic Kidney     Disease.   Labs are reviewed and are pertinent for sodium of 135, platelets of 142, and creatinine of 2.10.   Current Facility-Administered Medications  Medication Dose Route Frequency Provider Last Rate Last Dose  . 0.9 %  sodium chloride infusion   Intravenous Continuous Samuella Cota, MD 100 mL/hr at 11/17/13 0840    . acetaminophen (TYLENOL) tablet 500 mg  500 mg Oral Q6H PRN Karlyn Agee, MD       Or  . acetaminophen (TYLENOL) suppository 650 mg  650 mg Rectal Q6H PRN Karlyn Agee, MD      . allopurinol (ZYLOPRIM) tablet 100 mg  100 mg Oral Daily Karlyn Agee, MD   100 mg at 11/17/13 [phone]  . amLODipine (NORVASC) tablet 2.5 mg  2.5 mg Oral Daily Karlyn Agee, MD   2.5 mg at 11/17/13 0841  . antiseptic oral rinse (BIOTENE) solution 15 mL  15 mL Mouth Rinse BID Samuella Cota, MD   15 mL at 11/17/13 0800  . aspirin EC tablet 81 mg  81 mg Oral Daily Karlyn Agee, MD   81 mg at 11/17/13 3149  . atenolol (TENORMIN) tablet 50 mg  50 mg Oral Daily Karlyn Agee, MD   50 mg at 11/17/13 [phone]  . diclofenac (VOLTAREN) EC tablet 75 mg  75 mg Oral BID Samuella Cota, MD      .  docusate sodium (COLACE) capsule 100 mg  100 mg Oral BID Samuella Cota, MD   100 mg at 11/17/13 3785  . folic acid (FOLVITE) tablet 1 mg  1 mg Oral Daily Karlyn Agee, MD   1 mg at 11/17/13 0841  . heparin injection 5,000 Units  5,000 Units Subcutaneous Q8H Karlyn Agee, MD   5,000 Units at 11/17/13 1456  . HYDROmorphone (DILAUDID) injection 0.5 mg  0.5 mg Intravenous Q2H PRN Karlyn Agee, MD   0.5 mg at 11/17/13 1222  . levothyroxine (SYNTHROID, LEVOTHROID) tablet 25 mcg  25 mcg Oral QAC breakfast Karlyn Agee, MD   25 mcg at 11/17/13 [phone]  . ondansetron (ZOFRAN) injection 4 mg  4 mg Intravenous Q4H PRN Karlyn Agee, MD      . oxyCODONE (Oxy IR/ROXICODONE) immediate release tablet 5 mg  5 mg Oral Q4H PRN Karlyn Agee, MD   5 mg at 11/16/13 1548  . pantoprazole (  PROTONIX) EC tablet 80 mg  80 mg Oral BID Karlyn Agee, MD   80 mg at 11/17/13 0841  . polyethylene glycol (MIRALAX / GLYCOLAX) packet 17 g  17 g Oral BID Samuella Cota, MD   17 g at 11/17/13 0841  . sodium chloride 0.9 % injection 3 mL  3 mL Intravenous Q12H Karlyn Agee, MD   3 mL at 11/16/13 1000  . traZODone (DESYREL) tablet 25 mg  25 mg Oral QHS PRN Karlyn Agee, MD        Psychiatric Specialty Exam:     Blood pressure 120/75, pulse 83, temperature 98.7 F (37.1 C), temperature source Oral, resp. rate 20, height 5' 9"  (1.753 m), weight 127.3 kg (280 lb 10.3 oz), SpO2 90.00%.Body mass index is 41.43 kg/(m^2).  General Appearance: Disheveled  Eye Sport and exercise psychologist::  Fair  Speech:  Clear and Coherent  Volume:  Increased  Mood:  Anxious and Irritable  Affect:  Full Range  Thought Process:  Goal Directed and Intact  Orientation:  Full (Time, Place, and Person)  Thought Content:  Rumination  Suicidal Thoughts:  No  Homicidal Thoughts:  No  Memory:  Immediate;   Good Recent;   Good Remote;   Good  Judgement:  Fair  Insight:  Shallow  Psychomotor Activity:  Decreased  Concentration:  Good   Recall:  Good  Akathisia:  No  Handed:  Right  AIMS (if indicated):     Assets:  Communication Skills Housing Leisure Time Resilience Social Support  Sleep:      Treatment Plan Summary: Discussed case with Dr. Sabra Heck. At the time of this evaluation the patient adamantly denied any suicidal or homicidal ideation. He does not appear to be interested in being set up with any psychiatric follow up. Would recommend that patient's [facility] Care Provider continue to follow patient for his medical problems and also to monitor for any changes in his mood.   Disposition: Home when medically stable     DAVIS, LAURA NP-C 11/17/2013 3:46 PM  Agree with assessment and plan Geralyn Flash A. [place], [place].D

## 2013-11-17 NOTE — Progress Notes (Signed)
Patient complains of a lot of pain, states, " I just want to die" Patient has also told case management and patient relations about wanting to die. Dr. Sarajane Jews notified. New orders for suicide precaution and Psych consult.

## 2013-11-17 NOTE — BH Assessment (Signed)
Writer called Child psychotherapist to notify her Justin Shiley NP would plan on doing the telepsych at 3:30 pm.  Margreta Journey has the teleassessment machine in pt's room.  Arnold Long, [place] Assessment Counselor

## 2013-11-18 MED ORDER — ASPIRIN 81 MG PO TBEC: 81.0000 mg | DELAYED_RELEASE_TABLET | Freq: Every day | ORAL | Status: DC

## 2013-11-18 NOTE — Progress Notes (Signed)
When giving dilaudid to pt, he stated he was "in so much pain he wanted to die" and "if I had a gun, I'd blow my head off". Pt agitated and restless. I sat in room and talked with pt to try and calm him down. MD notified of pt's new behavior and ordered 58m ativan IV. No other orders. Pt rested the remainder of the night. Pt safe and stable .

## 2013-11-18 NOTE — Progress Notes (Signed)
[facility] PROGRESS NOTE  MARKY BURESH SEG:[id number] DOB: Jul 28, 1941 DOA: 11/12/2013 PCP: Monico Blitz, MD  Assessment/Plan: 1. Suicidal ideation: Resolved. Secondary to pain. Cleared by psychiatry. 2. Acute renal failure superimposed on chronic kidney disease stage III secondary to overdiuresis, nearly resolved with IV fluids, expect spontaneous resolution. 3. Acute hypoxic respiratory failure: Resolved, likely secondary to acute diastolic congestive heart failure. No clinical evidence of pneumonia as detailed below. Weaned off oxygen. 4. Acute diastolic congestive heart failure: Resolved. Compensated. Euvolemic. No need for iuretics currently. 5. New diagnosis of atrial fibrillation, rate controlled. Continue beta blocker. Not a candidate for anticoagulation at this point secondary to alcoholism. Continue beta blocker. 6. Acute respiratory failure, initially treated for pneumonia, however no fever, leukocytosis, cough and pneumonia was felt unlikely, antibiotics were not continued. Elevated BNP was noted and patient was treated with Lasix with good effect. 7. CT of the chest revealed possible empyema versus underlying malignancy. Pulmonology doubted empyema or infection. Repeat CT suggested in 6 weeks. If develops symptoms of fever or infection, consider thoracic surgery consultation. This was discussed with the patient who is well aware of the findings. 8. Hypothyroidism: TSH normal 9. Alcoholism, cirrhosis of the liver chronic thrombocytopenia, secondary to alcoholism 10. Osteoarthritis, maintained on Voltaren for many years, stable   Discharge home today  Followup with PCP in one week  Followup suspected cirrhosis from alcoholism and associated thrombocytopenia  Followup chest CT in 6 weeks to reevaluate abnormal findings  Followup with cardiology as an outpatient for new diagnosis of atrial fibrillation. Not currently a candidate for anticoagulation secondary to  alcoholism. Currently euvolemic and has not required diuretics. He has been on Voltaren for arthritis for many years with stable renal function by report. We discussed that NSAIDs can cause kidney dysfunction as well as precipitate heart failure but he is adamant this medication must continue in order for him to function and he would rather have kidney failure or heart failure then stop this medication.  [name], MD  [facility]  Pager [phone] If 7PM-7AM, please contact night-coverage at [email or web address], password [facility] 11/18/2013, 10:41 AM  LOS: 6 days   Summary: 72 year old man presented with shortness of breath, initially felt to have pneumonia, subsequent investigation was more consistent with CHF. History with diuretics with significant volume output. He developed evidence of acute renal failure likely from overdiuresis.  Consultants:  Pulmonology  Physical therapy recommended home health, patient refuses.  Procedures:  2-D echocardiogram: Severe LVH. Left ventricular ejection fraction 60-65%.  Antibiotics:    HPI/Subjective: Feels great today. He felt great yesterday afternoon after one dose of Voltaren. Wrist pain and bilateral foot pain gone. No new issues. Ready to go home. Cleared by psychiatry for discharge.  Objective: Filed Vitals:   11/17/13 0642 11/17/13 1500 11/17/13 2154 11/18/13 0630  BP: 120/75 88/60 126/86 121/85  Pulse: 83 73 75 70  Temp: 98.7 F (37.1 C) 98.6 F (37 C) 97.6 F (36.4 C) 98.1 F (36.7 C)  TempSrc: Oral  Oral Oral  Resp: 20 18 20 20   Height:      Weight:      SpO2: 90% 90% 96% 95%    Intake/Output Summary (Last 24 hours) at 11/18/13 1041 Last data filed at 11/18/13 0700  Gross per 24 hour  Intake    240 ml  Output   1051 ml  Net   -811 ml     Filed Weights   11/14/13 0500 11/16/13 0656 11/17/13 0500  Weight: 125.8 kg (277  lb 5.4 oz) 129.4 kg (285 lb 4.4 oz) 127.3 kg (280 lb 10.3 oz)    Exam:   Afebrile,  vital signs stable. O2 95% on room air.  General: Appears calm and comfortable.  Cardiovascular: Regular rate and rhythm. No murmur, rub or gallop.  Respiratory: Clear to auscultation bilaterally. No wheezes, rales or rhonchi. Normal respiratory effort.  Musculoskeletal: No edema or erythema of the left wrist or bilateral feet. No joint effusions. Excellent range of motion of bilateral upper tremors and lower extremities.  Psychiatric: Grossly normal mood and affect. Speech fluent and appropriate.  Data Reviewed:  No new data  Scheduled Meds: . allopurinol  100 mg Oral Daily  . amLODipine  2.5 mg Oral Daily  . antiseptic oral rinse  15 mL Mouth Rinse BID  . aspirin EC  81 mg Oral Daily  . atenolol  50 mg Oral Daily  . diclofenac  75 mg Oral BID  . docusate sodium  100 mg Oral BID  . folic acid  1 mg Oral Daily  . heparin  5,000 Units Subcutaneous Q8H  . levothyroxine  25 mcg Oral QAC breakfast  . pantoprazole  80 mg Oral BID  . polyethylene glycol  17 g Oral BID  . sodium chloride  3 mL Intravenous Q12H   Continuous Infusions: . sodium chloride 100 mL/hr at 11/17/13 1910    Principal Problem:   Acute renal failure Active Problems:   GERD (gastroesophageal reflux disease)   Cirrhosis   Obesity   A-fib   CKD (chronic kidney disease), stage III   Unspecified hypothyroidism   Dyspnea   Acute diastolic CHF (congestive heart failure)   Acute respiratory failure   Suicidal ideation

## 2013-11-18 NOTE — Progress Notes (Signed)
Patient being d/c home and told to f/u with primary MD. IV cath remove and intact. No pain/swelling at site. Verbalizes understanding.

## 2013-11-18 NOTE — Discharge Summary (Signed)
Physician Discharge Summary  Justin Parsons PJA:[id number] DOB: [birth year] DOA: 11/12/2013  PCP: Monico Blitz, MD  Admit date: 11/12/2013 Discharge date: 11/18/2013  Recommendations for Outpatient Follow-up:  1. Followup suspected cirrhosis from alcoholism and associated thrombocytopenia, consider outpatient evaluation  2. Followup chest CT in 6 weeks to reevaluate abnormal findings, see below 3. Followup with cardiology as an outpatient for new diagnosis of atrial fibrillation and diastolic heart failure. Not currently a candidate for anticoagulation secondary to alcoholism. See below.  4. Followup basic metabolic panel in one week, consider weaning of Voltaren if possible.   Follow-up Information   Follow up with [facility], MD. Schedule an appointment as soon as possible for a visit in 1 week.   Specialty:  Internal Medicine   Contact information:   [place] [address] [phone]       Follow up with Herminio Commons, MD On 12/09/2013. (8:40)    Specialty:  Cardiology   Contact information:   [address] [place] [address] [phone]      Discharge Diagnoses:  1. No diagnosis of atrial fibrillation 2. Acute hypoxic respiratory failure 3. Acute diastolic congestive heart failure 4. Acute renal failure superimposed on chronic kidney disease stage III 5. Abnormal CT of the chest 6. Alcoholism, cirrhosis  Discharge Condition: Improved Disposition: Home. Patient refused physical therapy.  Diet recommendation: Heart healthy diet.  Filed Weights   11/14/13 0500 11/16/13 0656 11/17/13 0500  Weight: 125.8 kg (277 lb 5.4 oz) 129.4 kg (285 lb 4.4 oz) 127.3 kg (280 lb 10.3 oz)    History of present illness/Hospital course:  72 year old man presented with shortness of breath, initially felt to have pneumonia, subsequent investigation was more consistent with CHF. Treated with diuretics with significant volume output. He developed evidence of  acute renal failure likely from overdiuresis and diuretics were stopped and history with gentle hydration. He has remained euvolemic. Kidney function is near baseline. He is now stable for discharge. See individual issues below.  1. Suicidal ideation: Resolved. Secondary to pain. Cleared by psychiatry. 2. Acute renal failure superimposed on chronic kidney disease stage III secondary to overdiuresis, nearly resolved with IV fluids, expect spontaneous resolution. 3. Acute hypoxic respiratory failure: Resolved, likely secondary to acute diastolic congestive heart failure. No clinical evidence of pneumonia as detailed below. Weaned off oxygen. 4. Acute diastolic congestive heart failure: Resolved. Compensated. Euvolemic. No need for diuretics currently. Weight down 3 kg from admission. 5. New diagnosis of atrial fibrillation, rate controlled. Continue beta blocker. Not a candidate for anticoagulation at this point secondary to alcoholism. Continue beta blocker. Consider ASA if able to stop Voltaren. 6. Acute respiratory failure, initially treated for pneumonia, however no fever, leukocytosis, cough and pneumonia was felt unlikely, antibiotics were not continued. Elevated BNP was noted and patient was treated with Lasix with good effect. 7. CT of the chest revealed possible empyema versus underlying malignancy. Clinical significance unclear. Pulmonology doubted empyema or infection. Repeat CT suggested in 6 weeks. If develops symptoms of fever or infection, consider thoracic surgery consultation. This was discussed with the patient who is well aware of the findings. 8. Hypothyroidism: TSH normal 9. Alcoholism, cirrhosis of the liver chronic thrombocytopenia, secondary to alcoholism 10. Osteoarthritis, maintained on Voltaren for many years, stable Followup with cardiology as an outpatient for new diagnosis of atrial fibrillation. Not currently a candidate for anticoagulation secondary to alcoholism. Currently  euvolemic and has not required diuretics. He has been on Voltaren for arthritis for many  years with stable renal function by report. We discussed that NSAIDs can cause kidney dysfunction as well as precipitate heart failure but he is adamant this medication must continue in order for him to function and he would rather have kidney failure or heart failure then stop this medication.  Consultants:  Pulmonology  Physical therapy recommended home health, patient refuses. Procedures:  2-D echocardiogram: Severe LVH. Left ventricular ejection fraction 60-65%.   Discharge Instructions  Discharge Orders   Future Appointments Provider Department Dept Phone   12/09/2013 8:40 AM Herminio Commons, MD [facility] [phone]   Future Orders Complete By Expires   (HEART FAILURE PATIENTS) Call MD:  Anytime you have any of the following symptoms: 1) 3 pound weight gain in 24 hours or 5 pounds in 1 week 2) shortness of breath, with or without a dry hacking cough 3) swelling in the hands, feet or stomach 4) if you have to sleep on extra pillows at night in order to breathe.  As directed    Diet - low sodium heart healthy  As directed    Discharge instructions  As directed    Comments:     Call physician or seek immediate medical assistance for shortness of breath, increased pain, abnormal weight gain.   Increase activity slowly  As directed        Medication List         allopurinol 100 MG tablet  Commonly known as:  ZYLOPRIM  Take 100 mg by mouth daily.     amLODipine 2.5 MG tablet  Commonly known as:  NORVASC  Take 2.5 mg by mouth daily.     atenolol 50 MG tablet  Commonly known as:  TENORMIN  Take 50 mg by mouth Daily.     diclofenac 75 MG EC tablet  Commonly known as:  VOLTAREN  Take 75 mg by mouth 2 (two) times daily.     iron polysaccharides 150 MG capsule  Commonly known as:  NIFEREX  Take 150 mg by mouth daily.     levothyroxine 25 MCG tablet  Commonly known as:   SYNTHROID, LEVOTHROID  Take 25 mcg by mouth daily before breakfast.     omeprazole 20 MG capsule  Commonly known as:  PRILOSEC  Take 20 mg by mouth 2 (two) times daily.     oxymetazoline 0.05 % nasal spray  Commonly known as:  AFRIN  Place 1 spray into both nostrils 2 (two) times daily as needed for congestion.     VITAMIN C PO  Take 1 tablet by mouth daily.       No Known Allergies The results of significant diagnostics from this hospitalization (including imaging, microbiology, ancillary and laboratory) are listed below for reference.    Significant Diagnostic Studies: Dg Chest 2 View  11/12/2013   CLINICAL DATA:  Short of breath. Acute renal failure. Hiatal hernia.  EXAM: CHEST  2 VIEW  COMPARISON:  06/04/2012.  FINDINGS: Cardiomegaly is present. There is engorgement of the vascular pedicle with pulmonary vascular congestion. Consolidation is present in the right lower lobe. Right pleural effusion is present with fluid tracking into the major fissure. Probable lumbar overlying emphysema. Mid thoracic compression fracture. The left lung base appears clear. Monitoring leads project over the chest.  IMPRESSION: 1. Cardiomegaly and pulmonary vascular congestion. 2. Consolidation at the right lung base suggesting right middle and/or right lower lobe pneumonia. Asymmetric/atypical pulmonary edema is in the differential considerations. 3. Small right pleural effusion.   Electronically  Signed   By: Dereck Ligas M.D.   On: 11/12/2013 17:31   Ct Chest Wo Contrast  11/12/2013   CLINICAL DATA:  Short of breath for 2 days.  EXAM: CT CHEST WITHOUT CONTRAST  TECHNIQUE: Multidetector CT imaging of the chest was performed following the standard protocol without IV contrast.  COMPARISON:  Chest radiograph 11/12/2013.  FINDINGS: Loculated right pleural effusion is present. There is a pulmonary nodule adjacent to the superior aspect of the major fissure measuring 8 mm. Pleural thickening is present  within the right chest. The findings raise the possibility of neoplastic involvement of the right lung with malignant effusion. Pleural effusion is loculated in the major fissure, compatible with pseudotumor. Coronary artery atherosclerosis is present. If office based assessment of coronary risk factors has not been performed, it is now recommended. Aortic branch vessel atherosclerosis. Small free-flowing left pleural effusion is present. There is no axillary adenopathy. No mediastinal adenopathy. No gross hilar adenopathy allowing for noncontrast technique.  Incidental imaging of the upper abdomen demonstrates cirrhosis of the liver. 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 Nonspecific nephric stranding. Atherosclerosis in the abdomen. No aggressive osseous lesions are identified.  IMPRESSION: 1. Small bilateral right greater than left pleural effusions. Loculated pleural fluid is present on the right in the major fissure. Pleural thickening is present which raises the possibility of empyema or malignant effusion. 2. 8 mm pulmonary nodule adjacent to loculated in the right major fissure. This may represent extension of fluid in the fissure however an underlying pulmonary nodule cannot be excluded. Follow-up short-term chest CT is recommended to assess for stability in two months. 3. Hepatic cirrhosis. 4. 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   Electronically Signed   By: Dereck Ligas M.D.   On: 11/12/2013 22:18      Labs: Basic Metabolic Panel:  Recent Labs Lab 11/13/13 0613 11/14/13 0612 11/15/13 0629 11/16/13 0535 11/17/13 0538  NA 141 138 135 134* 135*  K 4.3 4.4 4.2 4.0 4.5  CL 100 93* 92* 93* 95*  CO2 31 38* 35* 30 29  GLUCOSE 93 103* 117* 139* 101*  BUN 21 25* 30* 39* 32*  CREATININE 1.78* 2.23* 2.19* 2.62* 2.10*  CALCIUM 9.4 9.5 9.0 8.9 8.8   Liver Function Tests:  Recent Labs Lab 11/12/13 1600  AST 21  ALT 18  ALKPHOS 73  BILITOT 0.6  PROT 7.1  ALBUMIN  3.6    Recent Labs Lab 11/12/13 1600  LIPASE 59   CBC:  Recent Labs Lab 11/12/13 1600 11/13/13 0613 11/15/13 0629  WBC 10.9* 10.2 12.4*  NEUTROABS 7.8*  --   --   HGB 15.5 14.7 15.5  HCT 46.5 45.2 47.3  MCV 94.1 95.8 95.9  PLT 148* 141* 142*   Cardiac Enzymes:  Recent Labs Lab 11/12/13 1600  TROPONINI <0.30    Recent Labs  11/12/13 1600  PROBNP 4100.0*   Principal Problem:   Acute renal failure Active Problems:   GERD (gastroesophageal reflux disease)   Cirrhosis   Obesity   A-fib   CKD (chronic kidney disease), stage III   Unspecified hypothyroidism   Dyspnea   Acute diastolic CHF (congestive heart failure)   Acute respiratory failure   Suicidal ideation   Time coordinating discharge: 40 minutes  Signed:  [name], MD [facility] 11/18/2013, 10:55 AM

## 2013-12-09 ENCOUNTER — Encounter: Payer: Medicare Other | Admitting: Cardiovascular Disease

## 2013-12-16 ENCOUNTER — Encounter: Payer: Medicare Other | Admitting: Cardiovascular Disease

## 2014-10-05 ENCOUNTER — Telehealth: Payer: Self-pay

## 2014-10-05 NOTE — Telephone Encounter (Signed)
Pt said he run out of the medication that Dr. Gala Romney had given him and the pharmacy will not tell him what it was. He wanted me to spell it for him. He has not been seen in over 2 years, but I spelled the Prilosec for him . He said he will call back to schedule appt.

## 2014-11-04 ENCOUNTER — Telehealth: Payer: Self-pay

## 2014-11-04 ENCOUNTER — Encounter: Payer: Self-pay | Admitting: Gastroenterology

## 2014-11-04 ENCOUNTER — Ambulatory Visit (INDEPENDENT_AMBULATORY_CARE_PROVIDER_SITE_OTHER): Payer: Medicare Other | Admitting: Gastroenterology

## 2014-11-04 ENCOUNTER — Other Ambulatory Visit: Payer: Self-pay

## 2014-11-04 ENCOUNTER — Encounter (INDEPENDENT_AMBULATORY_CARE_PROVIDER_SITE_OTHER): Payer: Self-pay

## 2014-11-04 VITALS — BP 112/82 | HR 72 | Temp 98.0°F | Ht 69.0 in | Wt 295.6 lb

## 2014-11-04 DIAGNOSIS — R1314 Dysphagia, pharyngoesophageal phase: Secondary | ICD-10-CM | POA: Insufficient documentation

## 2014-11-04 DIAGNOSIS — D369 Benign neoplasm, unspecified site: Secondary | ICD-10-CM

## 2014-11-04 DIAGNOSIS — K7460 Unspecified cirrhosis of liver: Secondary | ICD-10-CM

## 2014-11-04 DIAGNOSIS — Z86010 Personal history of colonic polyps: Secondary | ICD-10-CM

## 2014-11-04 DIAGNOSIS — K7030 Alcoholic cirrhosis of liver without ascites: Secondary | ICD-10-CM

## 2014-11-04 MED ORDER — PEG-KCL-NACL-NASULF-NA ASC-C 100 G PO SOLR: 1.0000 | Freq: Once | ORAL | Status: AC

## 2014-11-04 NOTE — Patient Instructions (Addendum)
We have scheduled you for a colonoscopy, upper endoscopy, and dilation with Dr. Gala Romney in the near future.  Please have blood work done at the time of your ultrasound.  Please review the reflux diet. It is important to follow a good diet, avoid eating late, and avoidance of alcohol for your health.   Food Choices for Gastroesophageal Reflux Disease When you have gastroesophageal reflux disease (GERD), the foods you eat and your eating habits are very important. Choosing the right foods can help ease the discomfort of GERD. WHAT GENERAL GUIDELINES DO I NEED TO FOLLOW?  Choose fruits, vegetables, whole grains, low-fat dairy products, and low-fat meat, fish, and poultry.  Limit fats such as oils, salad dressings, butter, nuts, and avocado.  Keep a food diary to identify foods that cause symptoms.  Avoid foods that cause reflux. These may be different for different people.  Eat frequent small meals instead of three large meals each day.  Eat your meals slowly, in a relaxed setting.  Limit fried foods.  Cook foods using methods other than frying.  Avoid drinking alcohol.  Avoid drinking large amounts of liquids with your meals.  Avoid bending over or lying down until 2-3 hours after eating. WHAT FOODS ARE NOT RECOMMENDED? The following are some foods and drinks that may worsen your symptoms: Vegetables Tomatoes. Tomato juice. Tomato and spaghetti sauce. Chili peppers. Onion and garlic. Horseradish. Fruits Oranges, grapefruit, and lemon (fruit and juice). Meats High-fat meats, fish, and poultry. This includes hot dogs, ribs, ham, sausage, salami, and bacon. Dairy Whole milk and chocolate milk. Sour cream. Cream. Butter. Ice cream. Cream cheese.  Beverages Coffee and tea, with or without caffeine. Carbonated beverages or energy drinks. Condiments Hot sauce. Barbecue sauce.  Sweets/Desserts Chocolate and cocoa. Donuts. Peppermint and spearmint. Fats and Oils High-fat foods,  including [place] fries and potato chips. Other Vinegar. Strong spices, such as black pepper, white pepper, red pepper, cayenne, curry powder, cloves, ginger, and chili powder. The items listed above may not be a complete list of foods and beverages to avoid. Contact your dietitian for more information. Document Released: 11/05/2005 Document Revised: 11/10/2013 Document Reviewed: 09/09/2013 [facility] Patient Information 2015 [place], [place]. This information is not intended to replace advice given to you by your health care provider. Make sure you discuss any questions you have with your health care provider.

## 2014-11-04 NOTE — Telephone Encounter (Signed)
Called pt and LMOM to call office back. Pt is scheduled for [place] on 11/22/2014 @945 .

## 2014-11-04 NOTE — Progress Notes (Signed)
Referring Provider: Monico Blitz, MD Primary Care Physician:  Monico Blitz, MD  Primary GI: Dr. Gala Romney    Chief Complaint  Patient presents with  . Dysphagia    HPI:  Justin Parsons is a 73 y.o. 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 Last seen in March 2013 and underwent EGD for variceal screening. 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 Due for surveillance now. Last abdominal imaging in March 2013 with cirrhosis noted and portal vein thrombosis. Notes intermittent solid food dysphagia, pointing to cervical area. Has to throw up, drink water. States sometimes milk is difficult to swallow. Notes globus sensation. Feels like it may be cutting his wind off. Occasional nocturnal reflux if laying on back; notes that dietary choices significantly affect nocturnal reflux. Prilosec 40 mg once per day.   Drinks moonshine "once in awhile". But then states drinks 1/2 gallon in a week's time. Says he is an old man with no family, and he will have a few sips to get going. Takes a stool softener. He is also due for surveillance colonoscopy due to history of adenomatous polyps.   Past Medical History  Diagnosis Date  . HTN (hypertension)   . Arthritis   . 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 Adenomatous polyp of colon   . Hypothyroidism   . TIA (transient ischemic attack)   . Alcoholism     moonshine  . Cirrhosis of liver     no esophageal varices on EGD 01/2012  . Acute renal failure (ARF) 05/2012  . CKD (chronic kidney disease)   . 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 Hiatal hernia     Past Surgical History  Procedure Laterality Date  . Total hip arthroplasty  2002  . Total knee arthroplasty  1999/2003    left/right  . S/p eye implant  1997    artificial eye, right  . Colonoscopy  08/2008    normal, repeat exam in 5-7 years  . Colonoscopy  2004    rectal adenomatous polyp  . Total hip arthroplasty  2006/2012   revision in 2012  . Esophagogastroduodenoscopy  02/11/2012    Dr. Gala Romney: portal gastropathy, gastric erosions, esophageal ulcerations likely pill-induced, surveillance in 2 years  . Joint replacement    . Eye surgery    . Cardiac catheterization  2003    Current Outpatient Prescriptions  Medication Sig Dispense Refill  . allopurinol (ZYLOPRIM) 100 MG tablet Take 100 mg by mouth daily.      Marland Kitchen amLODipine (NORVASC) 2.5 MG tablet Take 2.5 mg by mouth daily.      . Ascorbic Acid (VITAMIN C PO) Take 1 tablet by mouth daily.    Marland Kitchen atenolol (TENORMIN) 50 MG tablet Take 50 mg by mouth Daily.    . diclofenac (VOLTAREN) 75 MG EC tablet Take 75 mg by mouth 2 (two) times daily.    Marland Kitchen levothyroxine (SYNTHROID, LEVOTHROID) 25 MCG tablet Take 25 mcg by mouth daily before breakfast.    . omeprazole (PRILOSEC) 40 MG capsule Take 40 mg by mouth daily.    Marland Kitchen oxymetazoline (AFRIN) 0.05 % nasal spray Place 1 spray into both nostrils 2 (two) times daily as needed for congestion.    . peg 3350 powder (MOVIPREP) 100 G SOLR Take 1 kit (200 g total) by mouth once. 1 kit 0   No current facility-administered medications for this visit.    Allergies as of 11/04/2014  . (No  Known Allergies)    Family History  Problem Relation Age of Onset  . Colon cancer Neg Hx   . Ovarian cancer Sister     History   Social History  . Marital Status: Divorced    Spouse Name: N/A    Number of Children: N/A  . Years of Education: N/A   Social History Main Topics  . Smoking status: Never Smoker   . Smokeless tobacco: None     Comment: used to chew tobacco, none in 15 years  . Alcohol Use: 0.0 oz/week    0 Not specified per week     Comment: half a gallon moonshine per week  . Drug Use: No  . Sexual Activity: None   Other Topics Concern  . None   Social History Narrative   One son deceased while in prison, drug addiction.    Review of Systems: Gen: see HPI CV: Denies chest pain, palpitations, syncope, peripheral  edema, and claudication. Resp: Denies dyspnea at rest, cough, wheezing, coughing up blood, and pleurisy. GI: see HPI Derm: Denies rash, itching, dry skin Psych: Denies depression, anxiety, memory loss, confusion. No homicidal or suicidal ideation.  Heme: Denies bruising, bleeding, and enlarged lymph nodes.  Physical Exam: BP 112/82 mmHg  Pulse 72  Temp(Src) 98 F (36.7 C) (Oral)  Ht _0  (1.753 m)  Wt 295 lb 9.6 oz (134.083 kg)  BMI 43.63 kg/m2 General:   Alert and oriented. No distress noted. Pleasant and cooperative.  Head:  Normocephalic and atraumatic. Eyes:  Conjuctiva clear without scleral icterus. Prosthetic right eye. Mouth:  Oral mucosa pink and moist.  Heart:  S1, S2 present without murmurs, rubs, or gallops.  Abdomen:  +BS, soft, largely obese, non-tender, distended but soft. Difficult to appreciate HSM due to large AP diameter.  Extremities:  Without edema. Neurologic:  Alert and  oriented x4;  grossly normal neurologically. Skin:  Intact without significant lesions or rashes. Psych:  Alert and cooperative. Normal mood and affect.

## 2014-11-08 ENCOUNTER — Telehealth: Payer: Self-pay | Admitting: Internal Medicine

## 2014-11-08 NOTE — Telephone Encounter (Signed)
PATIENT FAMILY MEMBER CALLED REGARDING HIS HEP SHOTS.  HEALTH DEPT WILL NOT GIVE THEM DUE TO HIS INSURANCE

## 2014-11-08 NOTE — Telephone Encounter (Signed)
Pt and wife Justin Parsons are aware of appointment.

## 2014-11-08 NOTE — Telephone Encounter (Signed)
Spoke with pts wife Lelon Frohlich. Instructed to her that [facility] in [place] will give the shots if they have a prescription.

## 2014-11-09 NOTE — Assessment & Plan Note (Signed)
73 year old male lost to follow-up with likely NASH/ETOH cirrhosis, known portal vein thrombosis from last imaging in 2013, due for esophageal varices screening with last EGD in 2013. Continues to drink moonshine intermittently but without any signs of decompensation. Needs routine labs, hepatitis vaccinations, and ultrasound of abdomen. See dysphagia; will need dilation at time of EGD as appropriate.   Proceed with EGD/ED  in the near future with Dr. Gala Romney. The risks, benefits, and alternatives have been discussed in detail with patient. They have stated understanding and desire to proceed.  Phenergan 12.5 mg IV on call due to ETOH use ETOH cessation discussed CBC, CMP, INR, US abdomen Hep A/B vaccination prescription provided

## 2014-11-09 NOTE — Assessment & Plan Note (Signed)
Remote past adenomatous polyp, with last TCS in Oct 2009. Due for surveillance now. Without any concerning lower GI symptoms.   Proceed with TCS with Dr. Gala Romney in near future: the risks, benefits, and alternatives have been discussed with the patient in detail. The patient states understanding and desires to proceed. Phenergan 12.5 mg IV on call due to ETOH use.

## 2014-11-09 NOTE — Assessment & Plan Note (Signed)
Intermittent solid food dysphagia, globus sensation. Query uncontrolled GERD, web, ring, stricture. Doubt occult malignancy. Proceed with EGD/ED as planned. May need BPE. Continue Prilosec 40 mg daily for now.

## 2014-11-10 ENCOUNTER — Other Ambulatory Visit: Payer: Self-pay

## 2014-11-17 ENCOUNTER — Telehealth: Payer: Self-pay

## 2014-11-17 NOTE — Telephone Encounter (Signed)
pts wife Webb Silversmith) called and said she needed help trying to find out where pt could get his Hep B shot. Called [facility] in [place] and spoke with Nira Conn in the pharmacy. She stated that they do Hep B shots there as long as pt has the prescription.  Called pts wife back and gave her the number to [facility]. Told her to call and talk to them.

## 2014-11-22 ENCOUNTER — Ambulatory Visit (HOSPITAL_COMMUNITY): Admission: RE | Admit: 2014-11-22 | Payer: Medicare Other | Source: Ambulatory Visit

## 2014-11-23 ENCOUNTER — Telehealth: Payer: Self-pay

## 2014-11-23 NOTE — Telephone Encounter (Signed)
Pt came to the window fuss and cussing. He told the staff to take the paper(his labs) and sit it up our (GD A**). Not sure what happen. Family came by early to see where he can have the Hep shot done at. I told him that he could go to [facility]. The family member understood and left.

## 2014-11-23 NOTE — Telephone Encounter (Signed)
The patient was in the car outside of the door calling into the practice being very abusive to myself and the staff. I also notified Risk Management and the privacy officer of this incident.

## 2014-11-23 NOTE — Telephone Encounter (Signed)
I witnessed this patient being verbally abusive to the staff. I am requesting the provider to discharge him from the practice.  Routing to Dr. Gala Romney for discharge approval.

## 2014-11-23 NOTE — Telephone Encounter (Signed)
I just spoke with the patient and he was very abusive to me on the phone.  I tried to explain to the pateint that he could not continue to speak rudely to me or my staff and he disconnected the call.

## 2014-11-23 NOTE — Telephone Encounter (Signed)
Per Felizardo Hoffmann, Manufacturing systems engineer for [facility] I followed the proper procedures by calling the police and giving them the patient's contact information.

## 2014-11-23 NOTE — Progress Notes (Signed)
cc'ed to pcp °

## 2014-11-24 ENCOUNTER — Telehealth: Payer: Self-pay

## 2014-11-24 NOTE — Telephone Encounter (Signed)
Pt called office this am requesting information on where he can get labs drawn.  States he wants to go somewhere close to home. Staff instructed him to go to [facility] and they could draw his labs there if he did not want to come all the way to [place]. Pt stated ok.

## 2014-11-26 ENCOUNTER — Telehealth: Payer: Self-pay

## 2014-11-26 NOTE — Telephone Encounter (Signed)
Pt called and said that [facility] denied payment for prep.  Faxed order to [facility] in [place] for a free unit.

## 2014-11-29 ENCOUNTER — Telehealth: Payer: Self-pay

## 2014-11-29 ENCOUNTER — Other Ambulatory Visit: Payer: Self-pay

## 2014-11-29 NOTE — Telephone Encounter (Signed)
Pt called and said he can't afford the prep and that the pharmacy wouldn't give him the free prep.  Instructed pt to come to office and pick up a free sample. Pt states he will be by to get it.  [place] per [place] for pt to come by and pick it up.

## 2014-12-01 ENCOUNTER — Ambulatory Visit (HOSPITAL_COMMUNITY)
Admission: RE | Admit: 2014-12-01 | Discharge: 2014-12-01 | Disposition: A | Discharge status: Home / Self Care | Payer: Medicare Other | Source: Ambulatory Visit | Attending: Internal Medicine | Admitting: Internal Medicine

## 2014-12-01 ENCOUNTER — Encounter (HOSPITAL_COMMUNITY): Payer: Self-pay | Admitting: *Deleted

## 2014-12-01 ENCOUNTER — Encounter (HOSPITAL_COMMUNITY)
Admission: RE | Disposition: A | Discharge status: Home / Self Care | Payer: Self-pay | Source: Ambulatory Visit | Attending: Internal Medicine

## 2014-12-01 DIAGNOSIS — Z860100 Personal history of colon polyps, unspecified: Secondary | ICD-10-CM | POA: Insufficient documentation

## 2014-12-01 DIAGNOSIS — I10 Essential (primary) hypertension: Secondary | ICD-10-CM | POA: Insufficient documentation

## 2014-12-01 DIAGNOSIS — R1310 Dysphagia, unspecified: Secondary | ICD-10-CM | POA: Diagnosis present

## 2014-12-01 DIAGNOSIS — K449 Diaphragmatic hernia without obstruction or gangrene: Secondary | ICD-10-CM | POA: Insufficient documentation

## 2014-12-01 DIAGNOSIS — K222 Esophageal obstruction: Secondary | ICD-10-CM | POA: Insufficient documentation

## 2014-12-01 DIAGNOSIS — K7460 Unspecified cirrhosis of liver: Secondary | ICD-10-CM

## 2014-12-01 DIAGNOSIS — R1319 Other dysphagia: Secondary | ICD-10-CM

## 2014-12-01 HISTORY — PX: COLONOSCOPY: SHX5424

## 2014-12-01 HISTORY — PX: ESOPHAGOGASTRODUODENOSCOPY: SHX5428

## 2014-12-01 SURGERY — COLONOSCOPY
Anesthesia: Moderate Sedation

## 2014-12-01 MED ORDER — MIDAZOLAM HCL 5 MG/5ML IJ SOLN
INTRAMUSCULAR | Status: AC
  Filled 2014-12-01: qty 10

## 2014-12-01 MED ORDER — SODIUM CHLORIDE 0.9 % IV SOLN
INTRAVENOUS | Status: DC
  Administered 2014-12-01: 1000 mL via INTRAVENOUS

## 2014-12-01 MED ORDER — MIDAZOLAM HCL 5 MG/5ML IJ SOLN
INTRAMUSCULAR | Status: DC | PRN
  Administered 2014-12-01: 1 mg via INTRAVENOUS
  Administered 2014-12-01: 2 mg via INTRAVENOUS
  Administered 2014-12-01: 1 mg via INTRAVENOUS

## 2014-12-01 MED ORDER — PROMETHAZINE HCL 25 MG/ML IJ SOLN
INTRAMUSCULAR | Status: AC
  Filled 2014-12-01: qty 1

## 2014-12-01 MED ORDER — PROMETHAZINE HCL 25 MG/ML IJ SOLN
12.5000 mg | Freq: Once | INTRAMUSCULAR | Status: AC
  Administered 2014-12-01: 12.5 mg via INTRAVENOUS

## 2014-12-01 MED ORDER — ONDANSETRON HCL 4 MG/2ML IJ SOLN
INTRAMUSCULAR | Status: DC | PRN
  Administered 2014-12-01: 4 mg via INTRAVENOUS

## 2014-12-01 MED ORDER — MEPERIDINE HCL 100 MG/ML IJ SOLN
INTRAMUSCULAR | Status: AC
  Filled 2014-12-01: qty 2

## 2014-12-01 MED ORDER — LIDOCAINE VISCOUS 2 % MT SOLN
OROMUCOSAL | Status: AC
  Filled 2014-12-01: qty 15

## 2014-12-01 MED ORDER — SIMETHICONE 40 MG/0.6ML PO SUSP
ORAL | Status: DC | PRN
  Administered 2014-12-01: 10:00:00

## 2014-12-01 MED ORDER — MEPERIDINE HCL 100 MG/ML IJ SOLN
INTRAMUSCULAR | Status: DC | PRN
  Administered 2014-12-01: 25 mg via INTRAVENOUS
  Administered 2014-12-01: 50 mg via INTRAVENOUS

## 2014-12-01 MED ORDER — ONDANSETRON HCL 4 MG/2ML IJ SOLN
INTRAMUSCULAR | Status: AC
  Filled 2014-12-01: qty 2

## 2014-12-01 MED ORDER — LIDOCAINE VISCOUS 2 % MT SOLN
OROMUCOSAL | Status: DC | PRN
  Administered 2014-12-01: 1 via OROMUCOSAL

## 2014-12-01 NOTE — Discharge Instructions (Signed)
EGD Discharge instructions Please read the instructions outlined below and refer to this sheet in the next few weeks. These discharge instructions provide you with general information on caring for yourself after you leave the hospital. Your doctor may also give you specific instructions. While your treatment has been planned according to the most current medical practices available, unavoidable complications occasionally occur. If you have any problems or questions after discharge, please call your doctor. ACTIVITY  You may resume your regular activity but move at a slower pace for the next 24 hours.   Take frequent rest periods for the next 24 hours.   Walking will help expel (get rid of) the air and reduce the bloated feeling in your abdomen.   No driving for 24 hours (because of the anesthesia (medicine) used during the test).   You may shower.   Do not sign any important legal documents or operate any machinery for 24 hours (because of the anesthesia used during the test).  NUTRITION  Drink plenty of fluids.   You may resume your normal diet.   Begin with a light meal and progress to your normal diet.   Avoid alcoholic beverages for 24 hours or as instructed by your caregiver.  MEDICATIONS  You may resume your normal medications unless your caregiver tells you otherwise.  WHAT YOU CAN EXPECT TODAY  You may experience abdominal discomfort such as a feeling of fullness or gas pains.  FOLLOW-UP  Your doctor will discuss the results of your test with you.  SEEK IMMEDIATE MEDICAL ATTENTION IF ANY OF THE FOLLOWING OCCUR:  Excessive nausea (feeling sick to your stomach) and/or vomiting.   Severe abdominal pain and distention (swelling).   Trouble swallowing.   Temperature over 101 F (37.8 C).  Rectal bleeding or vomiting of blood.  Colonoscopy Discharge Instructions  Read the instructions outlined below and refer to this sheet in the next few weeks. These discharge  instructions provide you with general information on caring for yourself after you leave the hospital. Your doctor may also give you specific instructions. While your treatment has been planned according to the most current medical practices available, unavoidable complications occasionally occur. If you have any problems or questions after discharge, call Dr. Gala Romney at [phone]. ACTIVITY You may resume your regular activity, but move at a slower pace for the next 24 hours.  Take frequent rest periods for the next 24 hours.  Walking will help get rid of the air and reduce the bloated feeling in your belly (abdomen).  No driving for 24 hours (because of the medicine (anesthesia) used during the test).   Do not sign any important legal documents or operate any machinery for 24 hours (because of the anesthesia used during the test).  NUTRITION Drink plenty of fluids.  You may resume your normal diet as instructed by your doctor.  Begin with a light meal and progress to your normal diet. Heavy or fried foods are harder to digest and may make you feel sick to your stomach (nauseated).  Avoid alcoholic beverages for 24 hours or as instructed.  MEDICATIONS You may resume your normal medications unless your doctor tells you otherwise.  WHAT YOU CAN EXPECT TODAY Some feelings of bloating in the abdomen.  Passage of more gas than usual.  Spotting of blood in your stool or on the toilet paper.  IF YOU HAD POLYPS REMOVED DURING THE COLONOSCOPY: No aspirin products for 7 days or as instructed.  No alcohol for 7 days  or as instructed.  Eat a soft diet for the next 24 hours.  FINDING OUT THE RESULTS OF YOUR TEST Not all test results are available during your visit. If your test results are not back during the visit, make an appointment with your caregiver to find out the results. Do not assume everything is normal if you have not heard from your caregiver or the medical facility. It is important for you to  follow up on all of your test results.  SEEK IMMEDIATE MEDICAL ATTENTION IF: You have more than a spotting of blood in your stool.  Your belly is swollen (abdominal distention).  You are nauseated or vomiting.  You have a temperature over 101.   You have abdominal pain or discomfort that is severe or gets worse throughout the day.     GERD information provided  Increase omeprazole to 40 mg twice daily  Avoid nonsteroidal drugs as much as possible  Chopped meats/soft diet/swallowing precautions   Further recommendations to follow pending review of pathology report  Repeat colonoscopy for surveillance purposes in 5 years.  Soft-Food Meal Plan A soft-food meal plan includes foods that are safe and easy to swallow. This meal plan typically is used:  If you are having trouble chewing or swallowing foods.  As a transition meal plan after only having had liquid meals for a long period. WHAT DO I NEED TO KNOW ABOUT THE SOFT-FOOD MEAL PLAN? A soft-food meal plan includes tender foods that are soft and easy to chew and swallow. In most cases, bite-sized pieces of food are easier to swallow. A bite-sized piece is about  inch or smaller. Foods in this plan do not need to be ground or pureed. Foods that are very hard, crunchy, or sticky should be avoided. Also, breads, cereals, yogurts, and desserts with nuts, seeds, or fruits should be avoided. WHAT FOODS CAN I EAT? Grains Rice and wild rice. Moist bread, dressing, pasta, and noodles. Well-moistened dry or cooked cereals, such as farina (cooked wheat cereal), oatmeal, or grits. Biscuits, breads, muffins, pancakes, and waffles that have been well moistened. Vegetables Shredded lettuce. Cooked, tender vegetables, including potatoes without skins. Vegetable juices. Broths or creamed soups made with vegetables that are not stringy or chewy. Strained tomatoes (without seeds). Fruits Canned or well-cooked fruits. Soft (ripe), peeled fresh  fruits, such as peaches, nectarines, kiwi, cantaloupe, honeydew melon, and watermelon (without seeds). Soft berries with small seeds, such as strawberries. Fruit juices (without pulp). Meats and Other Protein Sources Moist, tender, lean beef. Mutton. Lamb. Veal. Chicken. [place]. Liver. Ham. Fish without bones. Eggs. Dairy Milk, milk drinks, and cream. Plain cream cheese and cottage cheese. Plain yogurt. Sweets/Desserts Flavored gelatin desserts. Custard. Plain ice cream, frozen yogurt, sherbet, milk shakes, and malts. Plain cakes and cookies. Plain hard candy.  Other Butter, margarine (without trans fat), and cooking oils. Mayonnaise. Cream sauces. Mild spices, salt, and sugar. Syrup, molasses, honey, and jelly. The items listed above may not be a complete list of recommended foods or beverages. Contact your dietitian for more options. WHAT FOODS ARE NOT RECOMMENDED? Grains Dry bread, toast, crackers that have not been moistened. Coarse or dry cereals, such as bran, granola, and shredded wheat. Tough or chewy crusty breads, such as [place] bread or baguettes. Vegetables Corn. Raw vegetables except shredded lettuce. Cooked vegetables that are tough or stringy. Tough, crisp, fried potatoes and potato skins. Fruits Fresh fruits with skins or seeds or both, such as apples, pears, or grapes. Stringy, high-pulp fruits,  such as papaya, pineapple, coconut, or mango. Fruit leather, fruit roll-ups, and all dried fruits. Meats and Other Protein Sources Sausages and hot dogs. Meats with gristle. Fish with bones. Nuts, seeds, and chunky peanut or other nut butters. Sweets/Desserts Cakes or cookies that are very dry or chewy.  The items listed above may not be a complete list of foods and beverages to avoid. Contact your dietitian for more information. Document Released: 02/12/2008 Document Revised: 11/10/2013 Document Reviewed: 10/02/2013 [facility] Patient Information 2015 [place], [place]. This information  is not intended to replace advice given to you by your health care provider. Make sure you discuss any questions you have with your health care provider.   Gastroesophageal Reflux Disease, Adult Gastroesophageal reflux disease (GERD) happens when acid from your stomach flows up into the esophagus. When acid comes in contact with the esophagus, the acid causes soreness (inflammation) in the esophagus. Over time, GERD may create small holes (ulcers) in the lining of the esophagus. CAUSES   Increased body weight. This puts pressure on the stomach, making acid rise from the stomach into the esophagus.  Smoking. This increases acid production in the stomach.  Drinking alcohol. This causes decreased pressure in the lower esophageal sphincter (valve or ring of muscle between the esophagus and stomach), allowing acid from the stomach into the esophagus.  Late evening meals and a full stomach. This increases pressure and acid production in the stomach.  A malformed lower esophageal sphincter. Sometimes, no cause is found. SYMPTOMS   Burning pain in the lower part of the mid-chest behind the breastbone and in the mid-stomach area. This may occur twice a week or more often.  Trouble swallowing.  Sore throat.  Dry cough.  Asthma-like symptoms including chest tightness, shortness of breath, or wheezing. DIAGNOSIS  Your caregiver may be able to diagnose GERD based on your symptoms. In some cases, X-rays and other tests may be done to check for complications or to check the condition of your stomach and esophagus. TREATMENT  Your caregiver may recommend over-the-counter or prescription medicines to help decrease acid production. Ask your caregiver before starting or adding any new medicines.  HOME CARE INSTRUCTIONS   Change the factors that you can control. Ask your caregiver for guidance concerning weight loss, quitting smoking, and alcohol consumption.  Avoid foods and drinks that make your  symptoms worse, such as:  Caffeine or alcoholic drinks.  Chocolate.  Peppermint or mint flavorings.  Garlic and onions.  Spicy foods.  Citrus fruits, such as oranges, lemons, or limes.  Tomato-based foods such as sauce, chili, salsa, and pizza.  Fried and fatty foods.  Avoid lying down for the 3 hours prior to your bedtime or prior to taking a nap.  Eat small, frequent meals instead of large meals.  Wear loose-fitting clothing. Do not wear anything tight around your waist that causes pressure on your stomach.  Raise the head of your bed 6 to 8 inches with wood blocks to help you sleep. Extra pillows will not help.  Only take over-the-counter or prescription medicines for pain, discomfort, or fever as directed by your caregiver.  Do not take aspirin, ibuprofen, or other nonsteroidal anti-inflammatory drugs (NSAIDs). SEEK IMMEDIATE MEDICAL CARE IF:   You have pain in your arms, neck, jaw, teeth, or back.  Your pain increases or changes in intensity or duration.  You develop nausea, vomiting, or sweating (diaphoresis).  You develop shortness of breath, or you faint.  Your vomit is green, yellow, black, or  looks like coffee grounds or blood.  Your stool is red, bloody, or black. These symptoms could be signs of other problems, such as heart disease, gastric bleeding, or esophageal bleeding. MAKE SURE YOU:   Understand these instructions.  Will watch your condition.  Will get help right away if you are not doing well or get worse. Document Released: 08/15/2005 Document Revised: 01/28/2012 Document Reviewed: 05/25/2011 [facility] Patient Information 2015 [place], [place]. This information is not intended to replace advice given to you by your health care provider. Make sure you discuss any questions you have with your health care provider.

## 2014-12-01 NOTE — Interval H&P Note (Signed)
History and Physical Interval Note:  12/01/2014 9:50 AM  Justin Parsons  has presented today for surgery, with the diagnosis of dysphagia,esophageal varices,adenomatous polyps  The various methods of treatment have been discussed with the patient and family. After consideration of risks, benefits and other options for treatment, the patient has consented to  Procedure(s) with comments: COLONOSCOPY (N/A) - 9:30am ESOPHAGOGASTRODUODENOSCOPY (EGD) (N/A) MALONEY DILATION (N/A) SAVORY DILATION (N/A) as a surgical intervention .  The patient's history has been reviewed, patient examined, no change in status, stable for surgery.  I have reviewed the patient's chart and labs.  Questions were answered to the patient's satisfaction.     Akbar Sacra [employer] from [place] reviewed. Overall, no change. EGD with possible esophageal dilation and surveillance colonoscopy per plan.  The risks, benefits, limitations, imponderables and alternatives regarding both EGD and colonoscopy have been reviewed with the patient. Questions have been answered. All parties agreeable.

## 2014-12-01 NOTE — H&P (View-Only) (Signed)
Referring Provider: Monico Blitz, MD Primary Care Physician:  Monico Blitz, MD  Primary GI: Dr. Gala Romney    Chief Complaint  Patient presents with  . Dysphagia    HPI:  Justin Parsons is a 74 y.o. 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 Last seen in March 2013 and underwent EGD for variceal screening. 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 Due for surveillance now. Last abdominal imaging in March 2013 with cirrhosis noted and portal vein thrombosis. Notes intermittent solid food dysphagia, pointing to cervical area. Has to throw up, drink water. States sometimes milk is difficult to swallow. Notes globus sensation. Feels like it may be cutting his wind off. Occasional nocturnal reflux if laying on back; notes that dietary choices significantly affect nocturnal reflux. Prilosec 40 mg once per day.   Drinks moonshine "once in awhile". But then states drinks 1/2 gallon in a week's time. Says he is an old man with no family, and he will have a few sips to get going. Takes a stool softener. He is also due for surveillance colonoscopy due to history of adenomatous polyps.   Past Medical History  Diagnosis Date  . HTN (hypertension)   . Arthritis   . 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 Adenomatous polyp of colon   . Hypothyroidism   . TIA (transient ischemic attack)   . Alcoholism     moonshine  . Cirrhosis of liver     no esophageal varices on EGD 01/2012  . Acute renal failure (ARF) 05/2012  . CKD (chronic kidney disease)   . 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 Hiatal hernia     Past Surgical History  Procedure Laterality Date  . Total hip arthroplasty  2002  . Total knee arthroplasty  1999/2003    left/right  . S/p eye implant  1997    artificial eye, right  . Colonoscopy  08/2008    normal, repeat exam in 5-7 years  . Colonoscopy  2004    rectal adenomatous polyp  . Total hip arthroplasty  2006/2012   revision in 2012  . Esophagogastroduodenoscopy  02/11/2012    Dr. Gala Romney: portal gastropathy, gastric erosions, esophageal ulcerations likely pill-induced, surveillance in 2 years  . Joint replacement    . Eye surgery    . Cardiac catheterization  2003    Current Outpatient Prescriptions  Medication Sig Dispense Refill  . allopurinol (ZYLOPRIM) 100 MG tablet Take 100 mg by mouth daily.      Marland Kitchen amLODipine (NORVASC) 2.5 MG tablet Take 2.5 mg by mouth daily.      . Ascorbic Acid (VITAMIN C PO) Take 1 tablet by mouth daily.    Marland Kitchen atenolol (TENORMIN) 50 MG tablet Take 50 mg by mouth Daily.    . diclofenac (VOLTAREN) 75 MG EC tablet Take 75 mg by mouth 2 (two) times daily.    Marland Kitchen levothyroxine (SYNTHROID, LEVOTHROID) 25 MCG tablet Take 25 mcg by mouth daily before breakfast.    . omeprazole (PRILOSEC) 40 MG capsule Take 40 mg by mouth daily.    Marland Kitchen oxymetazoline (AFRIN) 0.05 % nasal spray Place 1 spray into both nostrils 2 (two) times daily as needed for congestion.    . peg 3350 powder (MOVIPREP) 100 G SOLR Take 1 kit (200 g total) by mouth once. 1 kit 0   No current facility-administered medications for this visit.    Allergies as of 11/04/2014  . (No  Known Allergies)    Family History  Problem Relation Age of Onset  . Colon cancer Neg Hx   . Ovarian cancer Sister     History   Social History  . Marital Status: Divorced    Spouse Name: N/A    Number of Children: N/A  . Years of Education: N/A   Social History Main Topics  . Smoking status: Never Smoker   . Smokeless tobacco: None     Comment: used to chew tobacco, none in 15 years  . Alcohol Use: 0.0 oz/week    0 Not specified per week     Comment: half a gallon moonshine per week  . Drug Use: No  . Sexual Activity: None   Other Topics Concern  . None   Social History Narrative   One son deceased while in prison, drug addiction.    Review of Systems: Gen: see HPI CV: Denies chest pain, palpitations, syncope, peripheral  edema, and claudication. Resp: Denies dyspnea at rest, cough, wheezing, coughing up blood, and pleurisy. GI: see HPI Derm: Denies rash, itching, dry skin Psych: Denies depression, anxiety, memory loss, confusion. No homicidal or suicidal ideation.  Heme: Denies bruising, bleeding, and enlarged lymph nodes.  Physical Exam: BP 112/82 mmHg  Pulse 72  Temp(Src) 98 F (36.7 C) (Oral)  Ht _0  (1.753 m)  Wt 295 lb 9.6 oz (134.083 kg)  BMI 43.63 kg/m2 General:   Alert and oriented. No distress noted. Pleasant and cooperative.  Head:  Normocephalic and atraumatic. Eyes:  Conjuctiva clear without scleral icterus. Prosthetic right eye. Mouth:  Oral mucosa pink and moist.  Heart:  S1, S2 present without murmurs, rubs, or gallops.  Abdomen:  +BS, soft, largely obese, non-tender, distended but soft. Difficult to appreciate HSM due to large AP diameter.  Extremities:  Without edema. Neurologic:  Alert and  oriented x4;  grossly normal neurologically. Skin:  Intact without significant lesions or rashes. Psych:  Alert and cooperative. Normal mood and affect.
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
## 2014-12-01 NOTE — Op Note (Signed)
[facility] [address] [place], [address]   COLONOSCOPY PROCEDURE REPORT  PATIENT: Justin Parsons, Justin Parsons  MR#: [id number] BIRTHDATE: 05-Feb-1941 , 30  yrs. old GENDER: male ENDOSCOPIST: R.  Garfield Cornea, MD FACP [facility] REFERRED VY:XAJLUN Manuella Ghazi, M.D. PROCEDURE DATE:  12-03-2014 PROCEDURE:   Colonoscopy, surveillance INDICATIONS:History of colonic adenoma. MEDICATIONS: Versed 4 mg IV and Demerol 75 mg IV in divided doses. Zofran 4 mg IV.  Phenergan 12.5 mg IV. ASA CLASS:       Class III  CONSENT: The risks, benefits, alternatives and imponderables including but not limited to bleeding, perforation as well as the possibility of a missed lesion have been reviewed.  The potential for biopsy, lesion removal, etc. have also been discussed. Questions have been answered.  All parties agreeable.  Please see the history and physical in the medical record for more information.  DESCRIPTION OF PROCEDURE:   After the risks benefits and alternatives of the procedure were thoroughly explained, informed consent was obtained.  The digital rectal exam revealed no abnormalities of the rectum.   The EC-[id number] ([id number])  endoscope was introduced through the anus and advanced to the cecum, which was identified by both the appendix and ileocecal valve. No adverse events experienced.   The quality of the prep was adequate.  The instrument was then slowly withdrawn as the colon was fully examined.      COLON FINDINGS: Normal rectum.  Elongated, redundant colon requiring some change in patient's position and external abdominal pressure to reach the cecum.  However, the rectal mucosa appeared otherwise normal.  Retroflexed views revealed no abnormalities. .  Withdrawal time=15 minutes 0 seconds.  The scope was withdrawn and the procedure completed. COMPLICATIONS: There were no immediate complications.  ENDOSCOPIC IMPRESSION: Redundant colon/normal  colonoscopy  RECOMMENDATIONS: 1 more surveillance colonoscopy in 5 years only of overall health permits. See EGD report.  eSigned:  R. Garfield Cornea, MD Rosalita Chessman [facility] 2014/12/03 11:00 AM   cc:  CPT CODES: ICD CODES:  The ICD and CPT codes recommended by this software are interpretations from the data that the clinical staff has captured with the software.  The verification of the translation of this report to the ICD and CPT codes and modifiers is the sole responsibility of the health care institution and practicing physician where this report was generated.  [employer]. will not be held responsible for the validity of the ICD and CPT codes included on this report.  AMA assumes no liability for data contained or not contained herein. CPT is a Designer, television/film set of the [employer].

## 2014-12-02 ENCOUNTER — Encounter (HOSPITAL_COMMUNITY): Payer: Self-pay | Admitting: Internal Medicine

## 2014-12-03 ENCOUNTER — Encounter: Payer: Self-pay | Admitting: Internal Medicine

## 2014-12-06 ENCOUNTER — Encounter: Payer: Self-pay | Admitting: Internal Medicine

## 2014-12-06 ENCOUNTER — Telehealth: Payer: Self-pay

## 2014-12-06 NOTE — Telephone Encounter (Signed)
APPOINTMENT MADE AND LETTER SENT °

## 2014-12-06 NOTE — Telephone Encounter (Signed)
Letter mailed to the pt. 

## 2014-12-06 NOTE — Telephone Encounter (Signed)
Letter from: Daneil Dolin     Send letter to patient.  Send copy of letter with path to referring provider and PCP.   Needs ov in 6 weeks to re-asses and set up repeat egd w diation

## 2014-12-07 NOTE — Telephone Encounter (Signed)
Patient aware of OV

## 2015-01-17 ENCOUNTER — Telehealth: Payer: Self-pay

## 2015-01-17 ENCOUNTER — Telehealth: Payer: Self-pay | Admitting: Internal Medicine

## 2015-01-17 NOTE — Telephone Encounter (Signed)
I spoke with the pt. See other phone note.

## 2015-01-17 NOTE — Telephone Encounter (Signed)
Pt said he had received a letter from [place] and wanted to know what it was about and did he need to go back to his PCP. I told him that RMR wanted him to follow up in 6 weeks with [place] from when he had his EGD and he is scheduled to come in for an OV on 3/3 with AS and to be re-assess and set up for a repeat EGD w./Dilation. Patient wanted to know if he could just go to his PCP. Call transferred to [facility]

## 2015-01-17 NOTE — Telephone Encounter (Signed)
Pt called wanting to know why he had a appt on Thursday with [place]. Informed him that RMR had wanted him to return to the office and possibly be scheduled for an EGD. He said he felt like he was doing ok now. He said he has been taking the medication and he wasn't taking it before. He is going to come in for his ov and then decide what he needs to do. I informed him that his ov was with Vicente Males and not with RMR. Pt stated that was ok.

## 2015-01-20 ENCOUNTER — Other Ambulatory Visit: Payer: Self-pay

## 2015-01-20 ENCOUNTER — Ambulatory Visit (INDEPENDENT_AMBULATORY_CARE_PROVIDER_SITE_OTHER): Payer: Medicare Other | Admitting: Gastroenterology

## 2015-01-20 ENCOUNTER — Encounter: Payer: Self-pay | Admitting: Gastroenterology

## 2015-01-20 VITALS — BP 123/79 | HR 75 | Temp 97.5°F | Ht 69.0 in | Wt 282.8 lb

## 2015-01-20 DIAGNOSIS — Z8719 Personal history of other diseases of the digestive system: Secondary | ICD-10-CM

## 2015-01-20 DIAGNOSIS — K7030 Alcoholic cirrhosis of liver without ascites: Secondary | ICD-10-CM | POA: Diagnosis not present

## 2015-01-20 DIAGNOSIS — K222 Esophageal obstruction: Secondary | ICD-10-CM

## 2015-01-20 NOTE — Patient Instructions (Addendum)
We have set you up for a repeat upper endoscopy with dilation due to your recent history of stricture. Continue Prilosec 40 mg twice a day, 30 minutes before breakfast and dinner.   Please have the blood work and ultrasound completed. It is important that we check your liver function and see what your liver looks like on ultrasound.

## 2015-01-20 NOTE — Progress Notes (Signed)
Referring Provider: Monico Blitz, MD Primary Care Physician:  Monico Blitz, MD  Primary GI: Dr. Gala Romney   Chief Complaint  Patient presents with  . Follow-up    HPI:   Justin Parsons is a 74 y.o. 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 Recently underwent EGD due to dysphagia and found to have a distal esophageal stricture, likely secondary to reflux and/or pill-induced esophagitis. No varices noted, portal gastropathy was present. Dilation performed with passage of the scope. Returns today to discuss repeat EGD with esophageal dilatation.    On Prilosec 40 mg daily now. He had been instructed to increase to BID, but he has not done so. Had one episode of choking since getting back on "the right medicine", but he is much improved overall. No abdominal pain. States he has slacked up with drinking moonshine but still drinks. Still taking Voltaren BID. States he has to take it or he can't get out of the bed. Refusing to stop this medication despite our recommendations.   He is in the process of completing Hep A/B vaccinations. Due for ultrasound and blood work now. Next colonoscopy in 5 years if health permits.   Past Medical History  Diagnosis Date  . HTN (hypertension)   . Arthritis   . 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 Adenomatous polyp of colon   . Hypothyroidism   . TIA (transient ischemic attack)   . Alcoholism     moonshine  . Cirrhosis of liver     completed Hep A/B vaccinations 2015/2016  . Acute renal failure (ARF) 05/2012  . CKD (chronic kidney disease)   . 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 Hiatal hernia     Past Surgical History  Procedure Laterality Date  . Total hip arthroplasty  2002  . Total knee arthroplasty  1999/2003    left/right  . S/p eye implant  1997    artificial eye, right  . Colonoscopy  08/2008    normal, repeat exam in 5-7 years  . Colonoscopy  2004    rectal adenomatous polyp  . Total hip  arthroplasty  2006/2012    revision in 2012  . Esophagogastroduodenoscopy  02/11/2012    Dr. Gala Romney: portal gastropathy, gastric erosions, esophageal ulcerations likely pill-induced, surveillance in 2 years  . Joint replacement    . Eye surgery    . Cardiac catheterization  2003  . Colonoscopy N/A 12/01/2014    DPO:EUMPNT rectum/elongated colon  . Esophagogastroduodenoscopy N/A 12/01/2014    Dr. Volney American esophageal stricture dilated with the scope passage, portal gastropathy, negative H.pylori on gastric biopsies, esophageal biopsies benign    Current Outpatient Prescriptions  Medication Sig Dispense Refill  . allopurinol (ZYLOPRIM) 100 MG tablet Take 100 mg by mouth daily.      Marland Kitchen amLODipine (NORVASC) 2.5 MG tablet Take 2.5 mg by mouth daily.      . Ascorbic Acid (VITAMIN C PO) Take 1 tablet by mouth daily.    Marland Kitchen atenolol (TENORMIN) 50 MG tablet Take 50 mg by mouth Daily.    . diclofenac (VOLTAREN) 75 MG EC tablet Take 75 mg by mouth 2 (two) times daily.    Marland Kitchen levothyroxine (SYNTHROID, LEVOTHROID) 25 MCG tablet Take 25 mcg by mouth daily before breakfast.    . omeprazole (PRILOSEC) 40 MG capsule Take 40 mg by mouth daily.    Marland Kitchen oxymetazoline (AFRIN) 0.05 % nasal spray Place 1 spray into both nostrils  2 (two) times daily as needed for congestion.     No current facility-administered medications for this visit.    Allergies as of 01/20/2015  . (No Known Allergies)    Family History  Problem Relation Age of Onset  . Colon cancer Neg Hx   . Ovarian cancer Sister     History   Social History  . Marital Status: Divorced    Spouse Name: N/A  . Number of Children: N/A  . Years of Education: N/A   Social History Main Topics  . Smoking status: Never Smoker   . Smokeless tobacco: Not on file     Comment: used to chew tobacco, none in 15 years  . Alcohol Use: 0.0 oz/week    0 Standard drinks or equivalent per week     Comment: half a gallon moonshine per week  . Drug Use: No  .  Sexual Activity: Not on file   Other Topics Concern  . None   Social History Narrative   One son deceased while in prison, drug addiction.    Review of Systems: As mentioned in HPI  Physical Exam: BP 123/79 mmHg  Pulse 75  Temp(Src) 97.5 F (36.4 C)  Ht 5' 9"  (1.753 m)  Wt 282 lb 12.8 oz (128.277 kg)  BMI 41.74 kg/m2 General:   Alert and oriented. No distress noted. Pleasant and cooperative.  Head:  Normocephalic and atraumatic. Eyes:  Conjuctiva clear without scleral icterus. Heart:  S1, S2 present without murmurs Abdomen:  +BS, soft, massively obese. non-tender and non-distended. Unable to truly appreciate HSM due to extremely large AP diameter.  Extremities:  Without edema. Neurologic:  Alert and  oriented x4;  grossly normal neurologically. Skin:  Intact without significant lesions or rashes. Psych:  Alert and cooperative. Normal mood and affect.

## 2015-01-21 ENCOUNTER — Encounter: Payer: Self-pay | Admitting: Gastroenterology

## 2015-01-21 NOTE — Assessment & Plan Note (Signed)
74 year old male with dysphagia secondary to esophageal stricture, noted on EGD Jan 2016. Dilation with scope passage at that time; he presents today to schedule repeat EGD with dilation as appropriate. Notes improvement in dysphagia since procedure, with just one episode of choking in the interim. Remains on only Prilosec 40 mg once daily, despite our recommendations to increase to BID dosing. He also remains on Voltaren and drinking moonshine despite our recommendations to cease any NSAIDs and alcohol. Agreeable to repeat EGD, but he continues to decline cessation of NSAIDs and ETOH despite long discussion of adverse effects.   Proceed with upper endoscopy and dilation in the near future with Dr. Gala Romney. The risks, benefits, and alternatives have been discussed in detail with patient. They have stated understanding and desire to proceed.  Phenergan 12.5 mg IV on call due to ETOH use Increase Prilosec to 40 mg BID ETOH cessation recommended NSAIDs cessation recommended

## 2015-01-21 NOTE — Assessment & Plan Note (Signed)
Secondary to NASH/ETOH, known portal vein thrombosis from last imaging in 2013. No evidence of decompensation. Fortunately, he has almost completed Hep A/B vaccinations. Needs labs and ultrasound of abdomen for hepatoma screening.

## 2015-01-25 ENCOUNTER — Ambulatory Visit (HOSPITAL_COMMUNITY)
Admission: RE | Admit: 2015-01-25 | Discharge: 2015-01-25 | Disposition: A | Discharge status: Home / Self Care | Payer: Medicare Other | Source: Ambulatory Visit | Attending: Gastroenterology | Admitting: Gastroenterology

## 2015-01-25 DIAGNOSIS — K7460 Unspecified cirrhosis of liver: Secondary | ICD-10-CM | POA: Insufficient documentation

## 2015-01-25 DIAGNOSIS — K7030 Alcoholic cirrhosis of liver without ascites: Secondary | ICD-10-CM

## 2015-01-25 NOTE — Progress Notes (Signed)
cc'ed to pcp °

## 2015-01-26 NOTE — Progress Notes (Signed)
Quick Note:  [place] without hepatoma. Repeat in 6 months. ______

## 2015-01-28 NOTE — Progress Notes (Signed)
ON RECALL FOR 6 MONTH ULTRASOUND

## 2015-02-07 ENCOUNTER — Ambulatory Visit (HOSPITAL_COMMUNITY)
Admission: RE | Admit: 2015-02-07 | Discharge: 2015-02-07 | Disposition: A | Discharge status: Home / Self Care | Payer: Medicare Other | Source: Ambulatory Visit | Attending: Internal Medicine | Admitting: Internal Medicine

## 2015-02-07 ENCOUNTER — Encounter (HOSPITAL_COMMUNITY)
Admission: RE | Disposition: A | Discharge status: Home / Self Care | Payer: Self-pay | Source: Ambulatory Visit | Attending: Internal Medicine

## 2015-02-07 DIAGNOSIS — K3189 Other diseases of stomach and duodenum: Secondary | ICD-10-CM | POA: Insufficient documentation

## 2015-02-07 DIAGNOSIS — E039 Hypothyroidism, unspecified: Secondary | ICD-10-CM | POA: Insufficient documentation

## 2015-02-07 DIAGNOSIS — Z96649 Presence of unspecified artificial hip joint: Secondary | ICD-10-CM | POA: Diagnosis not present

## 2015-02-07 DIAGNOSIS — H5441 Blindness, right eye, normal vision left eye: Secondary | ICD-10-CM | POA: Insufficient documentation

## 2015-02-07 DIAGNOSIS — Z96653 Presence of artificial knee joint, bilateral: Secondary | ICD-10-CM | POA: Insufficient documentation

## 2015-02-07 DIAGNOSIS — Z8673 Personal history of transient ischemic attack (TIA), and cerebral infarction without residual deficits: Secondary | ICD-10-CM | POA: Diagnosis not present

## 2015-02-07 DIAGNOSIS — F1020 Alcohol dependence, uncomplicated: Secondary | ICD-10-CM | POA: Diagnosis not present

## 2015-02-07 DIAGNOSIS — R1310 Dysphagia, unspecified: Secondary | ICD-10-CM | POA: Diagnosis present

## 2015-02-07 DIAGNOSIS — N189 Chronic kidney disease, unspecified: Secondary | ICD-10-CM | POA: Diagnosis not present

## 2015-02-07 DIAGNOSIS — K449 Diaphragmatic hernia without obstruction or gangrene: Secondary | ICD-10-CM | POA: Diagnosis not present

## 2015-02-07 DIAGNOSIS — I8500 Esophageal varices without bleeding: Secondary | ICD-10-CM | POA: Insufficient documentation

## 2015-02-07 DIAGNOSIS — Q394 Esophageal web: Secondary | ICD-10-CM | POA: Diagnosis not present

## 2015-02-07 DIAGNOSIS — I129 Hypertensive chronic kidney disease with stage 1 through stage 4 chronic kidney disease, or unspecified chronic kidney disease: Secondary | ICD-10-CM | POA: Diagnosis not present

## 2015-02-07 DIAGNOSIS — K222 Esophageal obstruction: Secondary | ICD-10-CM | POA: Insufficient documentation

## 2015-02-07 HISTORY — PX: ESOPHAGEAL DILATION: SHX303

## 2015-02-07 HISTORY — PX: ESOPHAGOGASTRODUODENOSCOPY: SHX5428

## 2015-02-07 SURGERY — EGD (ESOPHAGOGASTRODUODENOSCOPY)
Anesthesia: Moderate Sedation

## 2015-02-07 MED ORDER — LIDOCAINE VISCOUS 2 % MT SOLN
OROMUCOSAL | Status: AC
  Filled 2015-02-07: qty 15

## 2015-02-07 MED ORDER — SODIUM CHLORIDE 0.9 % IV SOLN
INTRAVENOUS | Status: DC
  Administered 2015-02-07: 1000 mL via INTRAVENOUS

## 2015-02-07 MED ORDER — MIDAZOLAM HCL 5 MG/5ML IJ SOLN
INTRAMUSCULAR | Status: AC
  Filled 2015-02-07: qty 10

## 2015-02-07 MED ORDER — LIDOCAINE VISCOUS 2 % MT SOLN
OROMUCOSAL | Status: DC | PRN
  Administered 2015-02-07: 3 mL via OROMUCOSAL

## 2015-02-07 MED ORDER — MIDAZOLAM HCL 5 MG/5ML IJ SOLN
INTRAMUSCULAR | Status: DC | PRN
  Administered 2015-02-07: 2 mg via INTRAVENOUS

## 2015-02-07 MED ORDER — MEPERIDINE HCL 100 MG/ML IJ SOLN
INTRAMUSCULAR | Status: AC
  Filled 2015-02-07: qty 2

## 2015-02-07 MED ORDER — PROMETHAZINE HCL 25 MG/ML IJ SOLN
INTRAMUSCULAR | Status: AC
  Filled 2015-02-07: qty 1

## 2015-02-07 MED ORDER — SODIUM CHLORIDE 0.9 % IJ SOLN
INTRAMUSCULAR | Status: AC
  Filled 2015-02-07: qty 3

## 2015-02-07 MED ORDER — PROMETHAZINE HCL 25 MG/ML IJ SOLN
12.5000 mg | Freq: Once | INTRAMUSCULAR | Status: AC
  Administered 2015-02-07: 12.5 mg via INTRAVENOUS

## 2015-02-07 MED ORDER — STERILE WATER FOR IRRIGATION IR SOLN
Status: DC | PRN
  Administered 2015-02-07: 11:00:00

## 2015-02-07 MED ORDER — MEPERIDINE HCL 100 MG/ML IJ SOLN
INTRAMUSCULAR | Status: DC | PRN
  Administered 2015-02-07: 50 mg via INTRAVENOUS

## 2015-02-07 MED ORDER — ONDANSETRON HCL 4 MG/2ML IJ SOLN
INTRAMUSCULAR | Status: DC | PRN
  Administered 2015-02-07: 4 mg via INTRAVENOUS

## 2015-02-07 MED ORDER — ONDANSETRON HCL 4 MG/2ML IJ SOLN
INTRAMUSCULAR | Status: AC
  Filled 2015-02-07: qty 2

## 2015-02-07 NOTE — Interval H&P Note (Signed)
History and Physical Interval Note:  02/07/2015 10:50 AM  Justin Parsons  has presented today for surgery, with the diagnosis of ESOPHAGEL STRICTURE  The various methods of treatment have been discussed with the patient and family. After consideration of risks, benefits and other options for treatment, the patient has consented to  Procedure(s) with comments: ESOPHAGOGASTRODUODENOSCOPY (EGD) (N/A) - 1115 ESOPHAGEAL DILATION (N/A) as a surgical intervention .  The patient's history has been reviewed, patient examined, no change in status, stable for surgery.  I have reviewed the patient's chart and labs.  Questions were answered to the patient's satisfaction.     Robert Rourk  No change. EGD with esophageal dilation as appropriate.The risks, benefits, limitations, alternatives and imponderables have been reviewed with the patient. Potential for esophageal dilation, biopsy, etc. have also been reviewed.  Questions have been answered. All parties agreeable.

## 2015-02-07 NOTE — OR Nursing (Signed)
Assisted pt to dress and in wheelchair, pt 94% on room air, denies SOB

## 2015-02-07 NOTE — H&P (View-Only) (Signed)
**Note Justin-Identified via Obfuscation** Referring Provider: Monico Blitz, MD Primary Care Physician:  Monico Blitz, MD  Primary GI: Dr. Gala Romney   Chief Complaint  Patient presents with  . Follow-up    HPI:   Justin Parsons is a 75 y.o. 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 Recently underwent EGD due to dysphagia and found to have a distal esophageal stricture, likely secondary to reflux and/or pill-induced esophagitis. No varices noted, portal gastropathy was present. Dilation performed with passage of the scope. Returns today to discuss repeat EGD with esophageal dilatation.    On Prilosec 40 mg daily now. He had been instructed to increase to BID, but he has not done so. Had one episode of choking since getting back on "the right medicine", but he is much improved overall. No abdominal pain. States he has slacked up with drinking moonshine but still drinks. Still taking Voltaren BID. States he has to take it or he can't get out of the bed. Refusing to stop this medication despite our recommendations.   He is in the process of completing Hep A/B vaccinations. Due for ultrasound and blood work now. Next colonoscopy in 5 years if health permits.   Past Medical History  Diagnosis Date  . HTN (hypertension)   . Arthritis   . 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 Adenomatous polyp of colon   . Hypothyroidism   . TIA (transient ischemic attack)   . Alcoholism     moonshine  . Cirrhosis of liver     completed Hep A/B vaccinations 2015/2016  . Acute renal failure (ARF) 05/2012  . CKD (chronic kidney disease)   . 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 Hiatal hernia     Past Surgical History  Procedure Laterality Date  . Total hip arthroplasty  2002  . Total knee arthroplasty  1999/2003    left/right  . S/p eye implant  1997    artificial eye, right  . Colonoscopy  08/2008    normal, repeat exam in 5-7 years  . Colonoscopy  2004    rectal adenomatous polyp  . Total hip  arthroplasty  2006/2012    revision in 2012  . Esophagogastroduodenoscopy  02/11/2012    Dr. Gala Romney: portal gastropathy, gastric erosions, esophageal ulcerations likely pill-induced, surveillance in 2 years  . Joint replacement    . Eye surgery    . Cardiac catheterization  2003  . Colonoscopy N/A 12/01/2014    TFT:DDUKGU rectum/elongated colon  . Esophagogastroduodenoscopy N/A 12/01/2014    Dr. Volney American esophageal stricture dilated with the scope passage, portal gastropathy, negative H.pylori on gastric biopsies, esophageal biopsies benign    Current Outpatient Prescriptions  Medication Sig Dispense Refill  . allopurinol (ZYLOPRIM) 100 MG tablet Take 100 mg by mouth daily.      Marland Kitchen amLODipine (NORVASC) 2.5 MG tablet Take 2.5 mg by mouth daily.      . Ascorbic Acid (VITAMIN C PO) Take 1 tablet by mouth daily.    Marland Kitchen atenolol (TENORMIN) 50 MG tablet Take 50 mg by mouth Daily.    . diclofenac (VOLTAREN) 75 MG EC tablet Take 75 mg by mouth 2 (two) times daily.    Marland Kitchen levothyroxine (SYNTHROID, LEVOTHROID) 25 MCG tablet Take 25 mcg by mouth daily before breakfast.    . omeprazole (PRILOSEC) 40 MG capsule Take 40 mg by mouth daily.    Marland Kitchen oxymetazoline (AFRIN) 0.05 % nasal spray Place 1 spray into both nostrils  2 (two) times daily as needed for congestion.     No current facility-administered medications for this visit.    Allergies as of 01/20/2015  . (No Known Allergies)    Family History  Problem Relation Age of Onset  . Colon cancer Neg Hx   . Ovarian cancer Sister     History   Social History  . Marital Status: Divorced    Spouse Name: N/A  . Number of Children: N/A  . Years of Education: N/A   Social History Main Topics  . Smoking status: Never Smoker   . Smokeless tobacco: Not on file     Comment: used to chew tobacco, none in 15 years  . Alcohol Use: 0.0 oz/week    0 Standard drinks or equivalent per week     Comment: half a gallon moonshine per week  . Drug Use: No  .  Sexual Activity: Not on file   Other Topics Concern  . None   Social History Narrative   One son deceased while in prison, drug addiction.    Review of Systems: As mentioned in HPI  Physical Exam: BP 123/79 mmHg  Pulse 75  Temp(Src) 97.5 F (36.4 C)  Ht 5' 9"  (1.753 m)  Wt 282 lb 12.8 oz (128.277 kg)  BMI 41.74 kg/m2 General:   Alert and oriented. No distress noted. Pleasant and cooperative.  Head:  Normocephalic and atraumatic. Eyes:  Conjuctiva clear without scleral icterus. Heart:  S1, S2 present without murmurs Abdomen:  +BS, soft, massively obese. non-tender and non-distended. Unable to truly appreciate HSM due to extremely large AP diameter.  Extremities:  Without edema. Neurologic:  Alert and  oriented x4;  grossly normal neurologically. Skin:  Intact without significant lesions or rashes. Psych:  Alert and cooperative. Normal mood and affect.

## 2015-02-07 NOTE — Op Note (Signed)
[place], [id number]   ENDOSCOPY PROCEDURE REPORT  PATIENT: Justin Parsons, Justin Parsons  MR#: [id number] BIRTHDATE: August 10, 1941 , 49  yrs. old GENDER: male ENDOSCOPIST: R.  Garfield Cornea, MD FACP FACG REFERRED BY:  Monico Blitz, M.D. PROCEDURE DATE:  02-14-2015 PROCEDURE:  EGD, diagnostic and Maloney dilation of esophagus INDICATIONS:  esophageal dysphagia; history of a known esophageal stricture?"biopsies previously benign. MEDICATIONS: Versed 2 mg IV and Demerol 50 mg IV.  Phenergan 12.5 mg IV.  Xylocaine gel orally.  Zofran 4 mg IV. ASA CLASS:      Class III  CONSENT: The risks, benefits, limitations, alternatives and imponderables have been discussed.  The potential for biopsy, esophogeal dilation, etc. have also been reviewed.  Questions have been answered.  All parties agreeable.  Please see the history and physical in the medical record for more information.  DESCRIPTION OF PROCEDURE: After the risks benefits and alternatives of the procedure were thoroughly explained, informed consent was obtained.  The EG-[id number] ([id number]) endoscope was introduced through the mouth and advanced to the second portion of the duodenum , limited by Without limitations. The instrument was slowly withdrawn as the mucosa was fully examined.    1 column of grade 2 esophageal varices distally.  Schatzki's ring present at the GE junction;  area of narrowed / strictured mucosa just above the ring.  No nodularity. no Barrett's esophagus.  No evidence of tumor.  Scope traversed the GE junction with mild resistance Stomach empty.  Small hiatal hernia.  Diffusely abnormal gastric mucosa consistent with a portal gastropathy.  No ulcer or infiltrating process.  Patent pylorus.  Normal first and second portion of the duodenum.  Scope was removed and a 5 [place] Maloney dilator was passed to full insertion with mild resistance.  A look back revealed the stricture had  been dilated as well as the ring.  Minimal bleeding.  No apparent complication.  Retroflexed views revealed a hiatal hernia. The scope was then withdrawn from the patient and the procedure completed.  COMPLICATIONS: There were no immediate complications.  ENDOSCOPIC IMPRESSION: Schatzki's ring with proximal peptic stricture?"status post dilation as described above. Hiatal hernia. Portal gastropathy.  RECOMMENDATIONS: Stop drinking alcohol and using nonsteroidal agents. Continue omeprazole 40 mg twice daily. Office follow up with [place] in 3 months.  REPEAT EXAM:  eSigned:  R. Garfield Cornea, MD Rosalita Chessman [facility] 02-14-15 11:16 AM    CC:  CPT CODES: ICD CODES:  The ICD and CPT codes recommended by this software are interpretations from the data that the clinical staff has captured with the software.  The verification of the translation of this report to the ICD and CPT codes and modifiers is the sole responsibility of the health care institution and practicing physician where this report was generated.  [place]. will not be held responsible for the validity of the ICD and CPT codes included on this report.  AMA assumes no liability for data contained or not contained herein. CPT is a Designer, television/film set of the [employer].  PATIENT NAME:  Justin Parsons, Justin Parsons MR#: [id number]

## 2015-02-07 NOTE — Discharge Instructions (Signed)
EGD Discharge instructions Please read the instructions outlined below and refer to this sheet in the next few weeks. These discharge instructions provide you with general information on caring for yourself after you leave the hospital. Your doctor may also give you specific instructions. While your treatment has been planned according to the most current medical practices available, unavoidable complications occasionally occur. If you have any problems or questions after discharge, please call your doctor. ACTIVITY  You may resume your regular activity but move at a slower pace for the next 24 hours.   Take frequent rest periods for the next 24 hours.   Walking will help expel (get rid of) the air and reduce the bloated feeling in your abdomen.   No driving for 24 hours (because of the anesthesia (medicine) used during the test).   You may shower.   Do not sign any important legal documents or operate any machinery for 24 hours (because of the anesthesia used during the test).  NUTRITION  Drink plenty of fluids.   You may resume your normal diet.   Begin with a light meal and progress to your normal diet.   Avoid alcoholic beverages for 24 hours or as instructed by your caregiver.  MEDICATIONS  You may resume your normal medications unless your caregiver tells you otherwise.  WHAT YOU CAN EXPECT TODAY  You may experience abdominal discomfort such as a feeling of fullness or gas pains.  FOLLOW-UP  Your doctor will discuss the results of your test with you.  SEEK IMMEDIATE MEDICAL ATTENTION IF ANY OF THE FOLLOWING OCCUR:  Excessive nausea (feeling sick to your stomach) and/or vomiting.   Severe abdominal pain and distention (swelling).   Trouble swallowing.   Temperature over 101 F (37.8 C).   Rectal bleeding or vomiting of blood.   GERD information provided  Stop drinking moonshine and all alcohol-containing beverages. Limit the use of Voltaren.  Continue  Prilosec 40 mg twice daily.  Office visit with [place] in 3 months. Gastroesophageal Reflux Disease, Adult Gastroesophageal reflux disease (GERD) happens when acid from your stomach flows up into the esophagus. When acid comes in contact with the esophagus, the acid causes soreness (inflammation) in the esophagus. Over time, GERD may create small holes (ulcers) in the lining of the esophagus. CAUSES   Increased body weight. This puts pressure on the stomach, making acid rise from the stomach into the esophagus.  Smoking. This increases acid production in the stomach.  Drinking alcohol. This causes decreased pressure in the lower esophageal sphincter (valve or ring of muscle between the esophagus and stomach), allowing acid from the stomach into the esophagus.  Late evening meals and a full stomach. This increases pressure and acid production in the stomach.  A malformed lower esophageal sphincter. Sometimes, no cause is found. SYMPTOMS   Burning pain in the lower part of the mid-chest behind the breastbone and in the mid-stomach area. This may occur twice a week or more often.  Trouble swallowing.  Sore throat.  Dry cough.  Asthma-like symptoms including chest tightness, shortness of breath, or wheezing. DIAGNOSIS  Your caregiver may be able to diagnose GERD based on your symptoms. In some cases, X-rays and other tests may be done to check for complications or to check the condition of your stomach and esophagus. TREATMENT  Your caregiver may recommend over-the-counter or prescription medicines to help decrease acid production. Ask your caregiver before starting or adding any new medicines.  HOME CARE INSTRUCTIONS   Change  the factors that you can control. Ask your caregiver for guidance concerning weight loss, quitting smoking, and alcohol consumption.  Avoid foods and drinks that make your symptoms worse, such as:  Caffeine or alcoholic drinks.  Chocolate.  Peppermint or mint  flavorings.  Garlic and onions.  Spicy foods.  Citrus fruits, such as oranges, lemons, or limes.  Tomato-based foods such as sauce, chili, salsa, and pizza.  Fried and fatty foods.  Avoid lying down for the 3 hours prior to your bedtime or prior to taking a nap.  Eat small, frequent meals instead of large meals.  Wear loose-fitting clothing. Do not wear anything tight around your waist that causes pressure on your stomach.  Raise the head of your bed 6 to 8 inches with wood blocks to help you sleep. Extra pillows will not help.  Only take over-the-counter or prescription medicines for pain, discomfort, or fever as directed by your caregiver.  Do not take aspirin, ibuprofen, or other nonsteroidal anti-inflammatory drugs (NSAIDs). SEEK IMMEDIATE MEDICAL CARE IF:   You have pain in your arms, neck, jaw, teeth, or back.  Your pain increases or changes in intensity or duration.  You develop nausea, vomiting, or sweating (diaphoresis).  You develop shortness of breath, or you faint.  Your vomit is green, yellow, black, or looks like coffee grounds or blood.  Your stool is red, bloody, or black. These symptoms could be signs of other problems, such as heart disease, gastric bleeding, or esophageal bleeding. MAKE SURE YOU:   Understand these instructions.  Will watch your condition.  Will get help right away if you are not doing well or get worse. Document Released: 08/15/2005 Document Revised: 01/28/2012 Document Reviewed: 05/25/2011 [facility] Patient Information 2015 [place], [place]. This information is not intended to replace advice given to you by your health care provider. Make sure you discuss any questions you have with your health care provider.

## 2015-02-09 ENCOUNTER — Encounter (HOSPITAL_COMMUNITY): Payer: Self-pay | Admitting: Internal Medicine

## 2015-04-04 ENCOUNTER — Encounter: Payer: Self-pay | Admitting: Internal Medicine

## 2015-06-21 ENCOUNTER — Telehealth: Payer: Self-pay | Admitting: Internal Medicine

## 2015-06-21 NOTE — Telephone Encounter (Signed)
Letter in the mail

## 2015-06-21 NOTE — Telephone Encounter (Signed)
ON September RECALL FOR ULTRASOUND

## 2016-02-15 DIAGNOSIS — Z789 Other specified health status: Secondary | ICD-10-CM | POA: Diagnosis not present

## 2016-02-15 DIAGNOSIS — I10 Essential (primary) hypertension: Secondary | ICD-10-CM | POA: Diagnosis not present

## 2016-02-15 DIAGNOSIS — I509 Heart failure, unspecified: Secondary | ICD-10-CM | POA: Diagnosis not present

## 2016-02-15 DIAGNOSIS — Z6841 Body Mass Index (BMI) 40.0 and over, adult: Secondary | ICD-10-CM | POA: Diagnosis not present

## 2016-03-12 ENCOUNTER — Other Ambulatory Visit: Payer: Self-pay

## 2016-03-12 MED ORDER — OMEPRAZOLE 40 MG PO CPDR: 40.0000 mg | DELAYED_RELEASE_CAPSULE | Freq: Two times a day (BID) | ORAL | Status: DC

## 2016-03-21 DIAGNOSIS — E7800 Pure hypercholesterolemia, unspecified: Secondary | ICD-10-CM | POA: Diagnosis not present

## 2016-03-21 DIAGNOSIS — Z7189 Other specified counseling: Secondary | ICD-10-CM | POA: Diagnosis not present

## 2016-03-21 DIAGNOSIS — Z0000 Encounter for general adult medical examination without abnormal findings: Secondary | ICD-10-CM | POA: Diagnosis not present

## 2016-03-21 DIAGNOSIS — Z6841 Body Mass Index (BMI) 40.0 and over, adult: Secondary | ICD-10-CM | POA: Diagnosis not present

## 2016-03-21 DIAGNOSIS — Z299 Encounter for prophylactic measures, unspecified: Secondary | ICD-10-CM | POA: Diagnosis not present

## 2016-03-21 DIAGNOSIS — Z1389 Encounter for screening for other disorder: Secondary | ICD-10-CM | POA: Diagnosis not present

## 2016-03-21 DIAGNOSIS — Z1211 Encounter for screening for malignant neoplasm of colon: Secondary | ICD-10-CM | POA: Diagnosis not present

## 2016-03-21 DIAGNOSIS — Z125 Encounter for screening for malignant neoplasm of prostate: Secondary | ICD-10-CM | POA: Diagnosis not present

## 2016-03-21 DIAGNOSIS — R5383 Other fatigue: Secondary | ICD-10-CM | POA: Diagnosis not present

## 2016-03-21 DIAGNOSIS — Z79899 Other long term (current) drug therapy: Secondary | ICD-10-CM | POA: Diagnosis not present

## 2016-09-25 DIAGNOSIS — Z23 Encounter for immunization: Secondary | ICD-10-CM | POA: Diagnosis not present

## 2016-11-23 DIAGNOSIS — M159 Polyosteoarthritis, unspecified: Secondary | ICD-10-CM | POA: Diagnosis not present

## 2016-11-23 DIAGNOSIS — I509 Heart failure, unspecified: Secondary | ICD-10-CM | POA: Diagnosis not present

## 2017-03-14 DIAGNOSIS — M159 Polyosteoarthritis, unspecified: Secondary | ICD-10-CM | POA: Diagnosis not present

## 2017-03-14 DIAGNOSIS — I509 Heart failure, unspecified: Secondary | ICD-10-CM | POA: Diagnosis not present

## 2017-03-21 DIAGNOSIS — N400 Enlarged prostate without lower urinary tract symptoms: Secondary | ICD-10-CM | POA: Diagnosis not present

## 2017-03-21 DIAGNOSIS — K7030 Alcoholic cirrhosis of liver without ascites: Secondary | ICD-10-CM | POA: Diagnosis not present

## 2017-03-21 DIAGNOSIS — N184 Chronic kidney disease, stage 4 (severe): Secondary | ICD-10-CM | POA: Diagnosis not present

## 2017-03-21 DIAGNOSIS — M109 Gout, unspecified: Secondary | ICD-10-CM | POA: Diagnosis not present

## 2017-03-21 DIAGNOSIS — G459 Transient cerebral ischemic attack, unspecified: Secondary | ICD-10-CM | POA: Diagnosis not present

## 2017-03-21 DIAGNOSIS — I4891 Unspecified atrial fibrillation: Secondary | ICD-10-CM | POA: Diagnosis not present

## 2017-03-21 DIAGNOSIS — Z299 Encounter for prophylactic measures, unspecified: Secondary | ICD-10-CM | POA: Diagnosis not present

## 2017-03-21 DIAGNOSIS — Z6841 Body Mass Index (BMI) 40.0 and over, adult: Secondary | ICD-10-CM | POA: Diagnosis not present

## 2017-03-21 DIAGNOSIS — I509 Heart failure, unspecified: Secondary | ICD-10-CM | POA: Diagnosis not present

## 2017-03-21 DIAGNOSIS — Z789 Other specified health status: Secondary | ICD-10-CM | POA: Diagnosis not present

## 2017-03-21 DIAGNOSIS — I10 Essential (primary) hypertension: Secondary | ICD-10-CM | POA: Diagnosis not present

## 2017-03-21 DIAGNOSIS — E7800 Pure hypercholesterolemia, unspecified: Secondary | ICD-10-CM | POA: Diagnosis not present

## 2017-04-27 ENCOUNTER — Inpatient Hospital Stay (HOSPITAL_COMMUNITY)
Admission: AD | Admit: 2017-04-27 | Discharge: 2017-05-06 | DRG: 246 | Disposition: A | Discharge status: Skilled Nursing Facility | Payer: Medicare Other | Source: Other Acute Inpatient Hospital | Attending: Internal Medicine | Admitting: Internal Medicine

## 2017-04-27 ENCOUNTER — Observation Stay (HOSPITAL_COMMUNITY): Payer: Medicare Other

## 2017-04-27 ENCOUNTER — Encounter (HOSPITAL_COMMUNITY): Payer: Self-pay

## 2017-04-27 DIAGNOSIS — F05 Delirium due to known physiological condition: Secondary | ICD-10-CM | POA: Diagnosis not present

## 2017-04-27 DIAGNOSIS — Z96653 Presence of artificial knee joint, bilateral: Secondary | ICD-10-CM | POA: Diagnosis present

## 2017-04-27 DIAGNOSIS — R55 Syncope and collapse: Secondary | ICD-10-CM | POA: Diagnosis not present

## 2017-04-27 DIAGNOSIS — K5900 Constipation, unspecified: Secondary | ICD-10-CM | POA: Diagnosis not present

## 2017-04-27 DIAGNOSIS — D696 Thrombocytopenia, unspecified: Secondary | ICD-10-CM | POA: Diagnosis present

## 2017-04-27 DIAGNOSIS — R578 Other shock: Secondary | ICD-10-CM | POA: Diagnosis present

## 2017-04-27 DIAGNOSIS — G9340 Encephalopathy, unspecified: Secondary | ICD-10-CM | POA: Diagnosis not present

## 2017-04-27 DIAGNOSIS — E512 Wernicke's encephalopathy: Secondary | ICD-10-CM | POA: Diagnosis present

## 2017-04-27 DIAGNOSIS — Z96649 Presence of unspecified artificial hip joint: Secondary | ICD-10-CM | POA: Diagnosis present

## 2017-04-27 DIAGNOSIS — Z7982 Long term (current) use of aspirin: Secondary | ICD-10-CM

## 2017-04-27 DIAGNOSIS — IMO0002 Reserved for concepts with insufficient information to code with codable children: Secondary | ICD-10-CM

## 2017-04-27 DIAGNOSIS — Z79899 Other long term (current) drug therapy: Secondary | ICD-10-CM | POA: Diagnosis not present

## 2017-04-27 DIAGNOSIS — R7989 Other specified abnormal findings of blood chemistry: Secondary | ICD-10-CM | POA: Diagnosis not present

## 2017-04-27 DIAGNOSIS — R404 Transient alteration of awareness: Secondary | ICD-10-CM | POA: Diagnosis not present

## 2017-04-27 DIAGNOSIS — I25119 Atherosclerotic heart disease of native coronary artery with unspecified angina pectoris: Secondary | ICD-10-CM | POA: Diagnosis not present

## 2017-04-27 DIAGNOSIS — E039 Hypothyroidism, unspecified: Secondary | ICD-10-CM | POA: Diagnosis present

## 2017-04-27 DIAGNOSIS — N183 Chronic kidney disease, stage 3 (moderate): Secondary | ICD-10-CM | POA: Diagnosis not present

## 2017-04-27 DIAGNOSIS — R471 Dysarthria and anarthria: Secondary | ICD-10-CM | POA: Diagnosis present

## 2017-04-27 DIAGNOSIS — E875 Hyperkalemia: Secondary | ICD-10-CM | POA: Diagnosis present

## 2017-04-27 DIAGNOSIS — Z87891 Personal history of nicotine dependence: Secondary | ICD-10-CM

## 2017-04-27 DIAGNOSIS — K7030 Alcoholic cirrhosis of liver without ascites: Secondary | ICD-10-CM | POA: Diagnosis present

## 2017-04-27 DIAGNOSIS — R443 Hallucinations, unspecified: Secondary | ICD-10-CM | POA: Diagnosis not present

## 2017-04-27 DIAGNOSIS — I25110 Atherosclerotic heart disease of native coronary artery with unstable angina pectoris: Secondary | ICD-10-CM | POA: Diagnosis present

## 2017-04-27 DIAGNOSIS — E861 Hypovolemia: Secondary | ICD-10-CM | POA: Diagnosis present

## 2017-04-27 DIAGNOSIS — Z6841 Body Mass Index (BMI) 40.0 and over, adult: Secondary | ICD-10-CM

## 2017-04-27 DIAGNOSIS — E669 Obesity, unspecified: Secondary | ICD-10-CM | POA: Diagnosis present

## 2017-04-27 DIAGNOSIS — K8020 Calculus of gallbladder without cholecystitis without obstruction: Secondary | ICD-10-CM | POA: Diagnosis not present

## 2017-04-27 DIAGNOSIS — M19011 Primary osteoarthritis, right shoulder: Secondary | ICD-10-CM | POA: Diagnosis not present

## 2017-04-27 DIAGNOSIS — I482 Chronic atrial fibrillation: Secondary | ICD-10-CM | POA: Diagnosis not present

## 2017-04-27 DIAGNOSIS — K3189 Other diseases of stomach and duodenum: Secondary | ICD-10-CM | POA: Diagnosis present

## 2017-04-27 DIAGNOSIS — I4891 Unspecified atrial fibrillation: Secondary | ICD-10-CM | POA: Diagnosis present

## 2017-04-27 DIAGNOSIS — F1099 Alcohol use, unspecified with unspecified alcohol-induced disorder: Secondary | ICD-10-CM | POA: Diagnosis not present

## 2017-04-27 DIAGNOSIS — R579 Shock, unspecified: Secondary | ICD-10-CM

## 2017-04-27 DIAGNOSIS — R001 Bradycardia, unspecified: Secondary | ICD-10-CM | POA: Diagnosis not present

## 2017-04-27 DIAGNOSIS — H5461 Unqualified visual loss, right eye, normal vision left eye: Secondary | ICD-10-CM | POA: Diagnosis present

## 2017-04-27 DIAGNOSIS — I10 Essential (primary) hypertension: Secondary | ICD-10-CM | POA: Diagnosis not present

## 2017-04-27 DIAGNOSIS — R4701 Aphasia: Secondary | ICD-10-CM | POA: Diagnosis present

## 2017-04-27 DIAGNOSIS — R52 Pain, unspecified: Secondary | ICD-10-CM

## 2017-04-27 DIAGNOSIS — E860 Dehydration: Secondary | ICD-10-CM | POA: Diagnosis present

## 2017-04-27 DIAGNOSIS — Z791 Long term (current) use of non-steroidal anti-inflammatories (NSAID): Secondary | ICD-10-CM

## 2017-04-27 DIAGNOSIS — Z720 Tobacco use: Secondary | ICD-10-CM

## 2017-04-27 DIAGNOSIS — M109 Gout, unspecified: Secondary | ICD-10-CM | POA: Diagnosis present

## 2017-04-27 DIAGNOSIS — M25529 Pain in unspecified elbow: Secondary | ICD-10-CM

## 2017-04-27 DIAGNOSIS — I272 Pulmonary hypertension, unspecified: Secondary | ICD-10-CM | POA: Diagnosis present

## 2017-04-27 DIAGNOSIS — I5031 Acute diastolic (congestive) heart failure: Secondary | ICD-10-CM | POA: Diagnosis present

## 2017-04-27 DIAGNOSIS — I130 Hypertensive heart and chronic kidney disease with heart failure and stage 1 through stage 4 chronic kidney disease, or unspecified chronic kidney disease: Secondary | ICD-10-CM | POA: Diagnosis present

## 2017-04-27 DIAGNOSIS — M25511 Pain in right shoulder: Secondary | ICD-10-CM | POA: Diagnosis present

## 2017-04-27 DIAGNOSIS — K219 Gastro-esophageal reflux disease without esophagitis: Secondary | ICD-10-CM | POA: Diagnosis present

## 2017-04-27 DIAGNOSIS — F10931 Alcohol use, unspecified with withdrawal delirium: Secondary | ICD-10-CM

## 2017-04-27 DIAGNOSIS — R29818 Other symptoms and signs involving the nervous system: Secondary | ICD-10-CM | POA: Diagnosis not present

## 2017-04-27 DIAGNOSIS — N179 Acute kidney failure, unspecified: Secondary | ICD-10-CM | POA: Diagnosis not present

## 2017-04-27 DIAGNOSIS — I2510 Atherosclerotic heart disease of native coronary artery without angina pectoris: Secondary | ICD-10-CM | POA: Diagnosis not present

## 2017-04-27 DIAGNOSIS — D72829 Elevated white blood cell count, unspecified: Secondary | ICD-10-CM | POA: Diagnosis present

## 2017-04-27 DIAGNOSIS — R4182 Altered mental status, unspecified: Secondary | ICD-10-CM | POA: Diagnosis not present

## 2017-04-27 DIAGNOSIS — F1090 Alcohol use, unspecified, uncomplicated: Secondary | ICD-10-CM

## 2017-04-27 DIAGNOSIS — R410 Disorientation, unspecified: Secondary | ICD-10-CM

## 2017-04-27 DIAGNOSIS — F10231 Alcohol dependence with withdrawal delirium: Secondary | ICD-10-CM | POA: Diagnosis not present

## 2017-04-27 DIAGNOSIS — I340 Nonrheumatic mitral (valve) insufficiency: Secondary | ICD-10-CM | POA: Diagnosis not present

## 2017-04-27 DIAGNOSIS — R061 Stridor: Secondary | ICD-10-CM | POA: Diagnosis not present

## 2017-04-27 DIAGNOSIS — I959 Hypotension, unspecified: Secondary | ICD-10-CM | POA: Diagnosis not present

## 2017-04-27 DIAGNOSIS — Z8673 Personal history of transient ischemic attack (TIA), and cerebral infarction without residual deficits: Secondary | ICD-10-CM

## 2017-04-27 DIAGNOSIS — I214 Non-ST elevation (NSTEMI) myocardial infarction: Secondary | ICD-10-CM | POA: Diagnosis not present

## 2017-04-27 DIAGNOSIS — M1000 Idiopathic gout, unspecified site: Secondary | ICD-10-CM | POA: Diagnosis not present

## 2017-04-27 DIAGNOSIS — R451 Restlessness and agitation: Secondary | ICD-10-CM | POA: Diagnosis not present

## 2017-04-27 DIAGNOSIS — R51 Headache: Secondary | ICD-10-CM | POA: Diagnosis not present

## 2017-04-27 DIAGNOSIS — M1A09X0 Idiopathic chronic gout, multiple sites, without tophus (tophi): Secondary | ICD-10-CM | POA: Diagnosis not present

## 2017-04-27 DIAGNOSIS — I517 Cardiomegaly: Secondary | ICD-10-CM | POA: Diagnosis not present

## 2017-04-27 DIAGNOSIS — I481 Persistent atrial fibrillation: Secondary | ICD-10-CM | POA: Diagnosis not present

## 2017-04-27 DIAGNOSIS — I639 Cerebral infarction, unspecified: Secondary | ICD-10-CM

## 2017-04-27 DIAGNOSIS — K766 Portal hypertension: Secondary | ICD-10-CM | POA: Diagnosis present

## 2017-04-27 DIAGNOSIS — J9811 Atelectasis: Secondary | ICD-10-CM | POA: Diagnosis not present

## 2017-04-27 DIAGNOSIS — M1990 Unspecified osteoarthritis, unspecified site: Secondary | ICD-10-CM | POA: Diagnosis not present

## 2017-04-27 LAB — BASIC METABOLIC PANEL
Anion gap: 10 (ref 5–15)
BUN: 51 mg/dL — ABNORMAL HIGH (ref 6–20)
CHLORIDE: 107 mmol/L (ref 101–111)
CO2: 20 mmol/L — AB (ref 22–32)
Calcium: 8.4 mg/dL — ABNORMAL LOW (ref 8.9–10.3)
Creatinine, Ser: 3.66 mg/dL — ABNORMAL HIGH (ref 0.61–1.24)
GFR calc Af Amer: 17 mL/min — ABNORMAL LOW (ref >60)
GFR calc non Af Amer: 15 mL/min — ABNORMAL LOW (ref >60)
Glucose, Bld: 108 mg/dL — ABNORMAL HIGH (ref 65–99)
Potassium: 6.1 mmol/L — ABNORMAL HIGH (ref 3.5–5.1)
Sodium: 137 mmol/L (ref 135–145)

## 2017-04-27 LAB — MRSA PCR SCREENING: MRSA by PCR: NEGATIVE

## 2017-04-27 LAB — TSH: TSH: 4.853 u[IU]/mL — AB (ref 0.350–4.500)

## 2017-04-27 LAB — AMMONIA: AMMONIA: 18 umol/L (ref 9–35)

## 2017-04-27 MED ORDER — SODIUM CHLORIDE 0.9 % IV SOLN: 250.0000 mL | INTRAVENOUS | Status: DC | PRN

## 2017-04-27 MED ORDER — SODIUM CHLORIDE 0.9 % IV SOLN
INTRAVENOUS | Status: DC
  Administered 2017-04-27: 21:00:00 via INTRAVENOUS

## 2017-04-27 MED ORDER — LORAZEPAM 2 MG/ML IJ SOLN
0.5000 mg | Freq: Once | INTRAMUSCULAR | Status: AC
  Administered 2017-04-27: 0.5 mg via INTRAVENOUS

## 2017-04-27 MED ORDER — ONDANSETRON HCL 4 MG/2ML IJ SOLN
4.0000 mg | Freq: Four times a day (QID) | INTRAMUSCULAR | Status: DC | PRN
  Administered 2017-04-27: 4 mg via INTRAVENOUS
  Filled 2017-04-27: qty 2

## 2017-04-27 MED ORDER — SODIUM CHLORIDE 0.9 % IV BOLUS (SEPSIS)
2000.0000 mL | Freq: Once | INTRAVENOUS | Status: AC
  Administered 2017-04-27: 2000 mL via INTRAVENOUS

## 2017-04-27 MED ORDER — HEPARIN SODIUM (PORCINE) 5000 UNIT/ML IJ SOLN
5000.0000 [IU] | Freq: Three times a day (TID) | INTRAMUSCULAR | Status: DC
  Administered 2017-04-28: 5000 [IU] via SUBCUTANEOUS
  Filled 2017-04-27: qty 1

## 2017-04-27 MED ORDER — LORAZEPAM 2 MG/ML IJ SOLN
INTRAMUSCULAR | Status: AC
  Filled 2017-04-27: qty 1

## 2017-04-27 NOTE — Progress Notes (Addendum)
Pt transferred from [facility] to [place] around [place] today.  Pt transferred to [address] [place] with expressive aphasia and unable to follow commands. Received report from [place] from [place] who stated pt was like this when he came to pick up pt. Dr. Martinique on the floor to admit pt, who stated to call a code stroke. Code stroke activated and pt went down for a ct of the head.

## 2017-04-27 NOTE — Consult Note (Signed)
Neurology Consultation Reason for Consult: Encephalpathy Referring Physician: Beckie Salts  CC: Encephalopathy  History is obtained from:patient  HPI: Justin Parsons is a 76 y.o. male with a history of atrial fibrillation, cirrhosis, portal vein thrombosis, CKD who presents with altered mental status. He apparently had a episode of hypotension/bradycardia requiring atropine and therefore is transferred to [facility]. Here, he was found to be quite confused. I called and spoke with a tech at [facility] he states that he was already confused when he arrived around 2:30 PM. He subsequently had progressive worsening of his encephalopathy during his time there.  On arrival to [facility], he would follow some commands but not all and was very dysarthric and not understandable. A code stroke was activated. He was taken for a stat head CT which was negative. He had received IV contrast at [facility] and had a creatinine of 4 which could be acute given that his last creatinine here was 2. Therefore, out of concern for kidney function he was taken for an MRI/MRA. The MRI does not demonstrate any clear infarct, the MRA is very limited by motion artifact but appears to show the M1 at least bifurcation open. On other sequences, it appears that there are distal flow voids in keeping with M2s. Given that he did not have any focal weakness and his symptoms predominantly encephalopathy, and there was no clear last known well, I did not feel that an intubation for further evaluation was indicated.   LKW: Unclear tpa given?: no, unclear time of onset, prior to 2:30 PM  ROS: A 14 point ROS was performed and is negative except as noted in the HPI.   Past Medical History:  Diagnosis Date  . Acute renal failure (ARF) 05/2012  . Adenomatous polyp of colon   . Alcoholism    moonshine  . Arthritis   . 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 Cirrhosis of liver    completed Hep A/B vaccinations 2015/2016   . CKD (chronic kidney disease)   . Hiatal hernia   . HTN (hypertension)   . Hypothyroidism   . 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 TIA (transient ischemic attack)      Family History  Problem Relation Age of Onset  . Colon cancer Neg Hx   . Ovarian cancer Sister      Social History:  reports that he has never smoked. He does not have any smokeless tobacco history on file. He reports that he drinks alcohol. He reports that he does not use drugs.   Exam: Current vital signs: BP (!) 107/93   Temp 97.8 F (36.6 C) (Oral)   SpO2 98%  Vital signs in last 24 hours: Temp:  [97.8 F (36.6 C)] 97.8 F (36.6 C) (06/09 1833) BP: (107)/(93) 107/93 (06/09 1833) SpO2:  [98 %] 98 % (06/09 1845)   Physical Exam  Constitutional: Appears Obese Psych: Affect appropriate to situation Eyes: No scleral injection HENT: No OP obstrucion Head: Normocephalic.  Cardiovascular: Normal rate and regular rhythm.  Respiratory: Effort normal and breath sounds normal to anterior ascultation GI: Soft.  No distension. There is no tenderness.  Skin: WDI  Neuro: Mental Status: Patient is awake, alert, mumbling speech, not understandable but I do make out a couple of words. He will follow some commands such as to catch her tongue, but when asked to squeeze my fingers he squeezes his own fingers. Cranial Nerves: II: He blinks to threat bilaterally Pupils on the  left is reactive III,IV, VI: Enucleated on the right, on the left he has full extraocular movements V: Facial sensation is symmetric to temperature VII: Facial movement is symmetric.  VIII: hearing is intact to voice X: Uvula elevates symmetrically XI: Shoulder shrug is symmetric. XII: tongue is midline without atrophy or fasciculations.  Motor: He is able to keep both arms elevated above the bed without drift, he is able to keep his right leg suspended without drift, his left leg does fall back to bed prior to 5 seconds. Sensory: He endorses  sensation bilaterally\ Cerebellar: He does not perform   I have reviewed labs in epic and the results pertinent to this consultation are: Creatinine from 3 years ago 2.1  I have reviewed the images obtained: CT head-no acute findings  MRI brain-limited due to motion, but no clear indication for intervention.  Impression: 76 year old male with new onset aphasia versus encephalopathy. No clear time of onset nor is there clear evidence of infarct on MRI. One possibility would be due to ischemia during his hypotensive episode, so-called "stunned brain syndrome" which is not infarcted and therefore not clear on MRI. Also with his elevated creatinine, given his relatively low dose of gabapentin may be too much and I will check a gabapentin level as this could cause encephalopathy.  Recommendations: 1) EEG 2) ammonia, TSH 3) if he remains persistently encephalopathic, could consider repeat MRI once he is more cooperative. 4) further recommendations following clinical course and above testing.   Roland Rack, MD [facility] [phone]  If 7pm- 7am, please page neurology on call as listed in [employer].

## 2017-04-27 NOTE — Progress Notes (Signed)
Patient new admit from [place] arrived to [facility] at 1821.  Code Stroke called by Dr. Martinique.  LSN unknown.  AS per RN, Patient developed aphasia around 1400 while at [place] and resolved, was then treated for hypotension and bradycardia, was also given narcan. It seems that around 1600 symptoms presented again. As per RN [facility] reported that patient was aphasic when arrived to transport to [facility].  NIHSS 16.  Patient transported to CT scan, Dr. Shon Hale met [place] in CT.  Patient transported back to room.

## 2017-04-27 NOTE — Consult Note (Signed)
CARDIOLOGY CONSULT NOTE     Patient ID: Justin Parsons MRN: [id number] DOB/AGE: [birth year] 76 y.o.  Admit date: 04/27/2017 Referring Physician [facility] ED- Georgina Quint MD Primary Physician Monico Blitz, MD Primary Cardiologist new Reason for Consultation Hypotension/bradycardia  HPI: 76 yo WM transferred from ED at [facility] for evaluation of hypotension and bradycardia. History obtained from ED records and remote records in [facility] from 2013-2016.  Patient presented to ED at approximately 13:30 with complaint of chest pain, HA, N/V x1. Prior to this he was mowing. He was noted to be bradycardic with junctional rhythm and Afib. He was hypotensive with BP 63/49. Given atropine IV and fluids. Neurologically he was reported to be intact. His hypotension and bradycardia resolved but he remained in Afib with a controlled rate. Stat CT of chest/abdomen/pelvis obtained which showed no acute process/ dissection/or PE. This was prior to labs being drawn showing a creatinine of 3.9. ED discussed with Dr Manuella Ghazi his primary care who recommended transfer to our facility and was accepted by Dr. Lovena Le. Prior to transfer records indicate patient developed mental status changes with slurred speech at about 1600. CT head was done showing no acute findings. Patient found by [facility] to be globally aphasic and he remained so on arrival here. CODE STROKE called. On arrival.  The patient has a history of CKD with last documented creatinine in 2016 between 2-2.5. 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 History of moonshine use. On chronic NSAIDs. Was admitted in 2014 with acute diastolic CHF. Was in Afib. Not felt to be a candidate for anticoagulation due to cirrhosis and Etoh use. Echo showed severe LVH and normal EF. Outpatient follow up with cardiology recommended but no records concerning this. He did have a cardiac cath in 2003 showing diffuse coronary ectasia but no  obstructive disease. Past history also indicates prior TIA, HTN, severe arthritis, and remote portal vein thrombosis.   Patient is unable to give any further history due to global aphasia and family not present.   Past Medical History:  Diagnosis Date  . Acute renal failure (ARF) 05/2012  . Adenomatous polyp of colon   . Alcoholism    moonshine  . Arthritis   . 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 Cirrhosis of liver    completed Hep A/B vaccinations 2015/2016  . CKD (chronic kidney disease)   . Hiatal hernia   . HTN (hypertension)   . Hypothyroidism   . 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 TIA (transient ischemic attack)     Family History  Problem Relation Age of Onset  . Colon cancer Neg Hx   . Ovarian cancer Sister     Social History   Social History  . Marital status: Divorced    Spouse name: N/A  . Number of children: N/A  . Years of education: N/A   Occupational History  . Not on file.   Social History Main Topics  . Smoking status: Never Smoker  . Smokeless tobacco: Not on file     Comment: used to chew tobacco, none in 15 years  . Alcohol use 0.0 oz/week     Comment: half a gallon moonshine per week  . Drug use: No  . Sexual activity: Not on file   Other Topics Concern  . Not on file   Social History Narrative   One son deceased while in prison, drug addiction.    Past Surgical History:  Procedure Laterality Date  .  CARDIAC CATHETERIZATION  2003  . COLONOSCOPY  08/2008   normal, repeat exam in 5-7 years  . COLONOSCOPY  2004   rectal adenomatous polyp  . COLONOSCOPY N/A 12/01/2014   QTM:AUQJFH rectum/elongated colon  . ESOPHAGEAL DILATION N/A 02/07/2015   Procedure: ESOPHAGEAL DILATION;  Surgeon: Daneil Dolin, MD;  [facility];  Service: Endoscopy;  Laterality: N/A;  . ESOPHAGOGASTRODUODENOSCOPY  02/11/2012   Dr. Gala Romney: portal gastropathy, gastric erosions, esophageal ulcerations likely pill-induced, surveillance in 2 years  .  ESOPHAGOGASTRODUODENOSCOPY N/A 12/01/2014   Dr. Volney American esophageal stricture dilated with the scope passage, portal gastropathy, negative H.pylori on gastric biopsies, esophageal biopsies benign  . ESOPHAGOGASTRODUODENOSCOPY N/A 02/07/2015   Procedure: ESOPHAGOGASTRODUODENOSCOPY (EGD);  Surgeon: Daneil Dolin, MD;  Location: [facility];  Service: Endoscopy;  Laterality: N/A;  1115  . EYE SURGERY    . JOINT REPLACEMENT    . s/p eye implant  1997   artificial eye, right  . TOTAL HIP ARTHROPLASTY  2002  . TOTAL HIP ARTHROPLASTY  2006/2012   revision in 2012  . TOTAL KNEE ARTHROPLASTY  1999/2003   left/right     Prescriptions Prior to Admission  Medication Sig Dispense Refill Last Dose  . allopurinol (ZYLOPRIM) 100 MG tablet Take 100 mg by mouth daily.     02/06/2015 at 0900  . amLODipine (NORVASC) 2.5 MG tablet Take 2.5 mg by mouth daily.     02/06/2015 at 0900  . Ascorbic Acid (VITAMIN C PO) Take 1 tablet by mouth daily.   02/06/2015 at 0900  . aspirin EC 81 MG tablet Take 81 mg by mouth daily.   02/06/2015 at 0900  . atenolol (TENORMIN) 50 MG tablet Take 50 mg by mouth Daily.   02/06/2015 at 0900  . diclofenac (VOLTAREN) 75 MG EC tablet Take 75 mg by mouth 2 (two) times daily.   02/06/2015 at 0900  . gabapentin (NEURONTIN) 300 MG capsule Take 300 mg by mouth 2 (two) times daily.    02/06/2015 at 0900  . omeprazole (PRILOSEC) 40 MG capsule Take 1 capsule (40 mg total) by mouth 2 (two) times daily. 60 capsule 5   . oxymetazoline (AFRIN) 0.05 % nasal spray Place 1 spray into both nostrils 2 (two) times daily as needed for congestion.   02/06/2015 at 0900    ROS: As noted in HPI. All other systems are reviewed and are negative unless otherwise mentioned.   Physical Exam: Blood pressure (!) 107/93, temperature 97.8 F (36.6 C), temperature source Oral, SpO2 98 %.  Current Weight  01/20/15 282 lb 12.8 oz (128.3 kg)  12/01/14 300 lb (136.1 kg)  11/04/14 295 lb 9.6 oz (134.1 kg)      GENERAL:  Well appearing, obese WM.  HEENT:  PERRL, EOMI, right eye prosthesis. Left sclera is clear. Oropharynx is clear. NECK:  No jugular venous distention, carotid upstroke brisk and symmetric, no bruits, no thyromegaly or adenopathy LUNGS:  Clear to auscultation bilaterally CHEST:  Unremarkable HEART:  IRRR,  PMI not displaced or sustained,S1 and S2 within normal limits, no S3, no S4: soft apical systolic murmur ABD:  Obese, Soft, nontender. BS +, no masses or bruits. No hepatomegaly, no splenomegaly EXT:  2 + pulses throughout, no edema, no cyanosis no clubbing. Old surgical scars on knees.  SKIN:  Warm and dry.  No rashes NEURO:  Alert but globally aphasic.  Able to move extremities but globally weak.     Labs: from [facility]:  WBC  13.6, platlets 159K, Hgb 14.5. Glucose 123, lactate 1.9, UA negative. Pro BNP 3990. BUN 55, creatinine 3.96. Potassium 5.6 chloride 101, CO2 20.3. LFTs normal. UDC negative Troponin T 0.03>> 1.2    Radiology: CT head: no acute abnormality. Atrophy and chronic small vessel white matter changes. CT chest/abd/pelvis with contrast: no PE. Pleural thickening and atx. Aortic and mitral valve calcification/ coronary calcification. Cirrhosis, right adrenal myelolipoma, gallstones.   EKG: Afib incomplete LBBB  ASSESSMENT AND PLAN:  1. 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 Global aphasia. CT head nonacute. CODE Stroke called and Neuro evaluating patient. Plan for stat MRI. Further considerations per Neuro. Not a good candidate for long term anticoagulation due to cirrhosis, Etoh use, portal gastropathy. 2. Atrial fibrillation- I suspect this is chronic with prior Afib in 2014. Rate currently controlled. Bradycardia may be related to acute CVA and/or increased vagal tone. 3. Acute on chronic renal failure. Creatinine 3.9 prior to received IV contrast bolus today. Given hypotension and chronic NSAID use I expect renal function to worsen  acutely. 4. Hyperkalemia. 5. Hypotension. Likely secondary to CVA. Will support with volume initially. Plan to transfer to ICU. CCM to admit  6. Chronic cirrhosis with portal gastropathy. 7. HTN 8. Arthritis.   The patient is critically ill with multiple organ systems failure and requires high complexity decision making for assessment and support, frequent evaluation and titration of therapies, application of advanced monitoring technologies and extensive interpretation of multiple databases. One hour spent face to face with patient and coordinating care.     Signed: Bowie Doiron [place], [place]  04/27/2017, 6:59 PM

## 2017-04-28 ENCOUNTER — Inpatient Hospital Stay (HOSPITAL_COMMUNITY): Payer: Medicare Other

## 2017-04-28 DIAGNOSIS — N183 Chronic kidney disease, stage 3 (moderate): Secondary | ICD-10-CM

## 2017-04-28 DIAGNOSIS — I340 Nonrheumatic mitral (valve) insufficiency: Secondary | ICD-10-CM

## 2017-04-28 DIAGNOSIS — R410 Disorientation, unspecified: Secondary | ICD-10-CM

## 2017-04-28 DIAGNOSIS — G9340 Encephalopathy, unspecified: Secondary | ICD-10-CM

## 2017-04-28 DIAGNOSIS — I482 Chronic atrial fibrillation: Secondary | ICD-10-CM

## 2017-04-28 LAB — BASIC METABOLIC PANEL
Anion gap: 8 (ref 5–15)
BUN: 50 mg/dL — AB (ref 6–20)
CALCIUM: 8 mg/dL — AB (ref 8.9–10.3)
CO2: 20 mmol/L — ABNORMAL LOW (ref 22–32)
CREATININE: 3.36 mg/dL — AB (ref 0.61–1.24)
Chloride: 110 mmol/L (ref 101–111)
GFR, EST AFRICAN AMERICAN: 19 mL/min — AB (ref >60)
GFR, EST NON AFRICAN AMERICAN: 16 mL/min — AB (ref >60)
Glucose, Bld: 105 mg/dL — ABNORMAL HIGH (ref 65–99)
Potassium: 5.5 mmol/L — ABNORMAL HIGH (ref 3.5–5.1)
SODIUM: 138 mmol/L (ref 135–145)

## 2017-04-28 LAB — CBC WITH DIFFERENTIAL/PLATELET
BASOS ABS: 0 10*3/uL (ref 0.0–0.1)
BASOS PCT: 0 %
EOS PCT: 0 %
Eosinophils Absolute: 0 10*3/uL (ref 0.0–0.7)
HCT: 43.4 % (ref 39.0–52.0)
Hemoglobin: 14 g/dL (ref 13.0–17.0)
Lymphocytes Relative: 16 %
Lymphs Abs: 2.2 10*3/uL (ref 0.7–4.0)
MCH: 29.5 pg (ref 26.0–34.0)
MCHC: 32.3 g/dL (ref 30.0–36.0)
MCV: 91.6 fL (ref 78.0–100.0)
MONO ABS: 1 10*3/uL (ref 0.1–1.0)
MONOS PCT: 7 %
NEUTROS ABS: 10.1 10*3/uL — AB (ref 1.7–7.7)
Neutrophils Relative %: 76 %
PLATELETS: 110 10*3/uL — AB (ref 150–400)
RBC: 4.74 MIL/uL (ref 4.22–5.81)
RDW: 17 % — AB (ref 11.5–15.5)
WBC: 13.3 10*3/uL — ABNORMAL HIGH (ref 4.0–10.5)

## 2017-04-28 LAB — ECHOCARDIOGRAM COMPLETE
CHL CUP RV SYS PRESS: 49 mmHg
CHL CUP TV REG PEAK VELOCITY: 339 cm/s
FS: 10 % — AB (ref 28–44)
Height: 68 in
IV/PV OW: 1.31
LA ID, A-P, ES: 53 mm
LA diam end sys: 53 mm
LA diam index: 2.25 cm/m2
LA vol A4C: 76.8 ml
LA vol index: 36.5 mL/m2
LA vol: 86.2 mL
LDCA: 3.14 cm2
LV PW d: 13 mm — AB (ref 0.6–1.1)
LVOTD: 20 mm
TR max vel: 339 cm/s
Weight: 4476.22 oz

## 2017-04-28 LAB — TROPONIN I
TROPONIN I: 35.27 ng/mL — AB (ref <0.03)
Troponin I: 40.36 ng/mL (ref <0.03)

## 2017-04-28 LAB — PROTIME-INR
INR: 1.25
Prothrombin Time: 15.7 seconds — ABNORMAL HIGH (ref 11.4–15.2)

## 2017-04-28 LAB — MAGNESIUM: MAGNESIUM: 1.6 mg/dL — AB (ref 1.7–2.4)

## 2017-04-28 LAB — CK: Total CK: 935 U/L — ABNORMAL HIGH (ref 49–397)

## 2017-04-28 LAB — PHOSPHORUS: PHOSPHORUS: 5.1 mg/dL — AB (ref 2.5–4.6)

## 2017-04-28 MED ORDER — STARCH (THICKENING) PO POWD: ORAL | Status: DC | PRN

## 2017-04-28 MED ORDER — GI COCKTAIL ~~LOC~~
30.0000 mL | Freq: Two times a day (BID) | ORAL | Status: DC | PRN
  Administered 2017-04-28 – 2017-05-03 (×2): 30 mL via ORAL
  Filled 2017-04-28 (×3): qty 30

## 2017-04-28 MED ORDER — ASPIRIN EC 81 MG PO TBEC
81.0000 mg | DELAYED_RELEASE_TABLET | Freq: Every day | ORAL | Status: DC
  Administered 2017-04-28 – 2017-05-02 (×5): 81 mg via ORAL
  Filled 2017-04-28 (×5): qty 1

## 2017-04-28 MED ORDER — ENOXAPARIN SODIUM 150 MG/ML ~~LOC~~ SOLN
125.0000 mg | SUBCUTANEOUS | Status: DC
  Filled 2017-04-28: qty 0.83

## 2017-04-28 MED ORDER — SODIUM CHLORIDE 0.9 % IV BOLUS (SEPSIS)
500.0000 mL | Freq: Once | INTRAVENOUS | Status: AC
  Administered 2017-04-28: 500 mL via INTRAVENOUS

## 2017-04-28 MED ORDER — ONDANSETRON HCL 4 MG/2ML IJ SOLN
4.0000 mg | Freq: Three times a day (TID) | INTRAMUSCULAR | Status: DC | PRN
  Administered 2017-05-03: 4 mg via INTRAVENOUS
  Filled 2017-04-28 (×2): qty 2

## 2017-04-28 MED ORDER — RESOURCE THICKENUP CLEAR PO POWD
ORAL | Status: DC | PRN
  Filled 2017-04-28: qty 125

## 2017-04-28 MED ORDER — HEPARIN (PORCINE) IN NACL 100-0.45 UNIT/ML-% IJ SOLN
1450.0000 [IU]/h | INTRAMUSCULAR | Status: DC
  Administered 2017-04-28 – 2017-04-30 (×3): 1250 [IU]/h via INTRAVENOUS
  Administered 2017-05-01: 1450 [IU]/h via INTRAVENOUS
  Filled 2017-04-28 (×5): qty 250

## 2017-04-28 MED ORDER — SODIUM CHLORIDE 0.9 % IV SOLN: INTRAVENOUS | Status: DC

## 2017-04-28 MED ORDER — MAGNESIUM SULFATE 2 GM/50ML IV SOLN
2.0000 g | Freq: Once | INTRAVENOUS | Status: AC
  Administered 2017-04-28: 2 g via INTRAVENOUS
  Filled 2017-04-28: qty 50

## 2017-04-28 MED ORDER — LEVALBUTEROL HCL 0.63 MG/3ML IN NEBU
0.6300 mg | INHALATION_SOLUTION | Freq: Four times a day (QID) | RESPIRATORY_TRACT | Status: DC | PRN
  Administered 2017-04-28: 0.63 mg via RESPIRATORY_TRACT

## 2017-04-28 MED ORDER — DEXMEDETOMIDINE HCL IN NACL 400 MCG/100ML IV SOLN
0.4000 ug/kg/h | INTRAVENOUS | Status: DC
  Administered 2017-04-28: 1.2 ug/kg/h via INTRAVENOUS
  Administered 2017-04-29 (×2): 0.4 ug/kg/h via INTRAVENOUS
  Filled 2017-04-28 (×2): qty 100

## 2017-04-28 MED ORDER — HALOPERIDOL LACTATE 5 MG/ML IJ SOLN
1.0000 mg | Freq: Four times a day (QID) | INTRAMUSCULAR | Status: DC | PRN
  Administered 2017-04-28 (×2): 1 mg via INTRAVENOUS
  Filled 2017-04-28 (×2): qty 1

## 2017-04-28 MED ORDER — PANTOPRAZOLE SODIUM 40 MG PO TBEC
40.0000 mg | DELAYED_RELEASE_TABLET | Freq: Every day | ORAL | Status: DC
  Administered 2017-04-28 – 2017-05-06 (×9): 40 mg via ORAL
  Filled 2017-04-28 (×8): qty 1

## 2017-04-28 MED ORDER — NOREPINEPHRINE BITARTRATE 1 MG/ML IV SOLN
0.0000 ug/min | INTRAVENOUS | Status: DC
  Administered 2017-04-28: 2 ug/min via INTRAVENOUS
  Filled 2017-04-28 (×3): qty 4

## 2017-04-28 MED ORDER — SODIUM CHLORIDE 0.9 % IV SOLN
0.4000 ug/kg/h | INTRAVENOUS | Status: DC
  Administered 2017-04-28: 0.4 ug/kg/h via INTRAVENOUS
  Filled 2017-04-28: qty 2

## 2017-04-28 MED ORDER — SODIUM CHLORIDE 0.9 % IV SOLN: 250.0000 mL | INTRAVENOUS | Status: DC | PRN

## 2017-04-28 MED ORDER — FUROSEMIDE 10 MG/ML IJ SOLN
INTRAMUSCULAR | Status: AC
  Filled 2017-04-28: qty 8

## 2017-04-28 MED ORDER — FUROSEMIDE 10 MG/ML IJ SOLN
80.0000 mg | Freq: Once | INTRAMUSCULAR | Status: AC
  Administered 2017-04-28: 80 mg via INTRAVENOUS
  Filled 2017-04-28: qty 8

## 2017-04-28 MED ORDER — LEVALBUTEROL HCL 0.63 MG/3ML IN NEBU
INHALATION_SOLUTION | RESPIRATORY_TRACT | Status: AC
  Administered 2017-04-28: 0.63 mg via RESPIRATORY_TRACT
  Filled 2017-04-28: qty 3

## 2017-04-28 NOTE — Progress Notes (Signed)
Progress Note  Patient Name: Justin Parsons Date of Encounter: 04/28/2017  Primary Cardiologist: new- [place]  Subjective   Notes burning in epigastric region. No other chest pain. No dyspnea.   Inpatient Medications    Scheduled Meds: . heparin  5,000 Units Subcutaneous Q8H  . pantoprazole  40 mg Oral Daily   Continuous Infusions: . sodium chloride 250 mL (04/28/17 0700)   PRN Meds: sodium chloride, levalbuterol, ondansetron   Vital Signs    Vitals:   04/28/17 0500 04/28/17 0513 04/28/17 0600 04/28/17 0700  BP: 109/85 120/89 123/83 (!) 130/92  Pulse: (!) 56 91 90 87  Resp: (!) 24 (!) 23 (!) 26 (!) 28  Temp:      TempSrc:      SpO2: 97% 97% 96% 91%  Weight:      Height:        Intake/Output Summary (Last 24 hours) at 04/28/17 0746 Last data filed at 04/28/17 0700  Gross per 24 hour  Intake          2251.25 ml  Output             1560 ml  Net           691.25 ml   Filed Weights   04/27/17 2100 04/27/17 2231 04/28/17 0149  Weight: 283 lb 8.2 oz (128.6 kg) 279 lb 12.2 oz (126.9 kg) 279 lb 12.2 oz (126.9 kg)    Telemetry    Afib with controlled response - Personally Reviewed  ECG    pending- Personally Reviewed  Physical Exam   GEN: No acute distress.  Obese Neck: No JVD or bruits Cardiac: RRR, normal S1-2. Soft systolic murmur LSB.  Respiratory: Clear to auscultation bilaterally. GI: Soft, nontender, non-distended  MS: No edema; No deformity. Neuro:  Nonfocal, patient has intelligible speech this am although nursing staff notes some agitation.  Psych: Normal affect   Labs    Chemistry Recent Labs Lab 04/27/17 2129 04/28/17 0042  NA 137 138  K 6.1* 5.5*  CL 107 110  CO2 20* 20*  GLUCOSE 108* 105*  BUN 51* 50*  CREATININE 3.66* 3.36*  CALCIUM 8.4* 8.0*  GFRNONAA 15* 16*  GFRAA 17* 19*  ANIONGAP 10 8     Hematology Recent Labs Lab 04/28/17 0042  WBC 13.3*  RBC 4.74  HGB 14.0  HCT 43.4  MCV 91.6  MCH 29.5  MCHC 32.3    RDW 17.0*  PLT 110*    Cardiac EnzymesNo results for input(s): TROPONINI in the last 168 hours. No results for input(s): TROPIPOC in the last 168 hours.   BNPNo results for input(s): BNP, PROBNP in the last 168 hours.   DDimer No results for input(s): DDIMER in the last 168 hours.   Radiology    Mr Jodene Nam Head Wo Contrast  Result Date: 04/27/2017 CLINICAL DATA:  Acute onset of altered mental status and aphasia. EXAM: MRI HEAD WITHOUT CONTRAST MRA HEAD WITHOUT CONTRAST TECHNIQUE: Multiplanar, multiecho pulse sequences of the brain and surrounding structures were obtained without intravenous contrast. Angiographic images of the head were obtained using MRA technique without contrast. COMPARISON:  CT head without contrast from the same day. FINDINGS: MRI HEAD FINDINGS Brain: The diffusion-weighted images demonstrate no acute or subacute infarction. The study is moderately degraded by patient motion. This limits detection of small lesions. Mild to moderate periventricular white matter changes are noted bilaterally. The brainstem and cerebellum are normal. Vascular: Flow is present in the major intracranial arteries release  the level of the MCA bifurcations and basilar tip. Skull and upper cervical spine: The skullbase is within normal limits. The craniocervical junction is unremarkable. Sinuses/Orbits: A remote left orbital floor fracture is noted. The paranasal sinuses are otherwise clear. A prosthetic right globe is noted. MRA HEAD FINDINGS Time-of-flight images are significantly degraded by patient motion. Flow is present in the internal carotid artery is new the MCA bifurcations bilaterally. Flow is present in both vertebral arteries the basilar tip. Branch vessel signal is obscured by patient motion. IMPRESSION: 1. No acute infarct. 2. Mild-to-moderate white matter disease likely reflects the sequela of chronic microvascular ischemia. 3. The study is moderately degraded by patient motion. 4. No  significant proximal stenosis or occlusion at the circle of Willis. Branch vessel evaluation cannot be performed secondary to significant patient motion on the time-of-flight images. These results were discussed in person at the time of interpretation on 04/27/2017 at 8:35 Pm to Dr. Roland Rack , who verbally acknowledged these results. Electronically Signed   By: San Morelle M.D.   On: 04/27/2017 21:09   Mr Brain Wo Contrast  Result Date: 04/27/2017 CLINICAL DATA:  Acute onset of altered mental status and aphasia. EXAM: MRI HEAD WITHOUT CONTRAST MRA HEAD WITHOUT CONTRAST TECHNIQUE: Multiplanar, multiecho pulse sequences of the brain and surrounding structures were obtained without intravenous contrast. Angiographic images of the head were obtained using MRA technique without contrast. COMPARISON:  CT head without contrast from the same day. FINDINGS: MRI HEAD FINDINGS Brain: The diffusion-weighted images demonstrate no acute or subacute infarction. The study is moderately degraded by patient motion. This limits detection of small lesions. Mild to moderate periventricular white matter changes are noted bilaterally. The brainstem and cerebellum are normal. Vascular: Flow is present in the major intracranial arteries release the level of the MCA bifurcations and basilar tip. Skull and upper cervical spine: The skullbase is within normal limits. The craniocervical junction is unremarkable. Sinuses/Orbits: A remote left orbital floor fracture is noted. The paranasal sinuses are otherwise clear. A prosthetic right globe is noted. MRA HEAD FINDINGS Time-of-flight images are significantly degraded by patient motion. Flow is present in the internal carotid artery is new the MCA bifurcations bilaterally. Flow is present in both vertebral arteries the basilar tip. Branch vessel signal is obscured by patient motion. IMPRESSION: 1. No acute infarct. 2. Mild-to-moderate white matter disease likely reflects the  sequela of chronic microvascular ischemia. 3. The study is moderately degraded by patient motion. 4. No significant proximal stenosis or occlusion at the circle of Willis. Branch vessel evaluation cannot be performed secondary to significant patient motion on the time-of-flight images. These results were discussed in person at the time of interpretation on 04/27/2017 at 8:35 Pm to Dr. Roland Rack , who verbally acknowledged these results. Electronically Signed   By: San Morelle M.D.   On: 04/27/2017 21:09   Dg Chest Port 1 View  Result Date: 04/28/2017 CLINICAL DATA:  Renal failure EXAM: PORTABLE CHEST 1 VIEW COMPARISON:  Yesterday FINDINGS: Mediastinum remains prominent. Heart remains enlarged. Low volumes and bibasilar atelectasis. No pneumothorax. No pleural effusion. IMPRESSION: Cardiomegaly and bibasilar atelectasis. Electronically Signed   By: Marybelle Killings M.D.   On: 04/28/2017 07:23   Ct Head Code Stroke Wo Contrast  Result Date: 04/27/2017 CLINICAL DATA:  Code stroke.  Intermittent aphasia. EXAM: CT HEAD WITHOUT CONTRAST TECHNIQUE: Contiguous axial images were obtained from the base of the skull through the vertex without intravenous contrast. COMPARISON:  CT head from the same  day at [place] head without contrast 06/04/2012 at [facility]: Brain: No acute infarct, hemorrhage, or mass lesion is present. Mild atrophy and white matter changes are stable. No focal cortical defect is evident. The basal ganglia and insular ribbon are intact. Vascular: Vessels are somewhat hyperdense diffusely without significant asymmetry. Atherosclerotic calcifications are present in the cavernous internal carotid artery's. Skull: Calvarium is intact. No focal lytic or blastic lesions are present. Sinuses/Orbits: Paranasal sinuses are clear. A remote left orbital floor fracture is again noted. There is no evidence for entrapment of the inferior rectus muscle. The globes and  orbits are otherwise within normal limits. ASPECTS [facility] Stroke Program Early CT Score) - Ganglionic level infarction (caudate, lentiform nuclei, internal capsule, insula, M1-M3 cortex): 7/7 - Supraganglionic infarction (M4-M6 cortex): 3/3 Total score (0-10 with 10 being normal): 10/10 IMPRESSION: 1. No acute intracranial abnormality or significant interval change. 2. Stable atrophy and white matter disease. 3. ASPECTS is 10/10 These results were pager texted at the time of interpretation on 04/27/2017 at 7:14 pm to Dr. Leonel Ramsay. Electronically Signed   By: San Morelle M.D.   On: 04/27/2017 19:14    Cardiac Studies   Echo pending.  Patient Profile     76 y.o. male with history of Afib- probably chronic, cirrhosis with portal gastropathy, CKD transferred from [facility] with acute mental status changes, global aphasia, hypotension, and initially bradycardia.   Assessment & Plan    1. Acute encephalopathy with aphasia. Improved today. Per Neuro evaluation including CT and MRI this is less likely to be acute CVA. ? "shock brain" due to hypotension on arrival. EEG pending.  2. Acute on chronic kidney failure. Exacerbated by NSAID use, dehydration. Received IV contrast for CT yesterday. Would not be surprised if renal function worsens in next 48 hours. Stop NSAIDs. Supportive care to maintain normal BP. 3. AFib probably chronic. Rate controlled currently on no meds. If additional rate control needed we can start metoprolol. Not a candidate for anticoagulation due to portal gastropathy, cirrhosis, and ongoing moonshine use. Echo pending.  4. Alcoholic cirrhosis with portal gastropathy. Ongoing moonshine use.  5. Chronic thrombocytopenia 6. Hyperkalemia. Per CCM 7. Hypomagnesemia. Replete.  8. HTN  Signed, Domanique Huesman [place], MD  04/28/2017, 7:46 AM

## 2017-04-28 NOTE — Progress Notes (Signed)
  Echocardiogram 2D Echocardiogram has been performed.  Justin Parsons 04/28/2017, 11:41 AM

## 2017-04-28 NOTE — Progress Notes (Signed)
Updated Dr.Deterding on changes in pt condition and VS. Pt nauseated and c/o pain. Restless and agitated. RT at bedside for wheezing and SOB with speaking.

## 2017-04-28 NOTE — Progress Notes (Signed)
Pt's BP began to drop around 1700 and pt became very somnolent. Held Precedex, started a 500 mL bolus, and placed pt in trendelenburg position. Pt was arousable and interacted with staff. Pt has been very confused and hallucinating throughout the day. Hallucinations were still present during hypotensive episode. Pt's BP began to trend back towards baseline. Dr. Tamala Julian of CCM notified. Order received for Levophed to maintain MAP above 65. Precedex remains off at this time.

## 2017-04-28 NOTE — Progress Notes (Signed)
eLink Physician-Brief Progress Note Patient Name: FAHD GALEA DOB: Sep 20, 1941 MRN: [id number]   Date of Service  04/28/2017  HPI/Events of Note  Increasing agitation with concern for ETOH withdrawal. markedly elevated trop consistent with ACS  eICU Interventions  precedex drip Aspirin Full dose lovenox Cardiology to be notified of trop result by nurse     Intervention Category Intermediate Interventions: Other:  Mauri [place], P 04/28/2017, 3:40 PM

## 2017-04-28 NOTE — Progress Notes (Addendum)
[place] for heparin Indication: chest pain/ACS   Heparin dosing weight: 97.9 kg   Assessment: 23 yom admitted with possible stroke (unlikely but still in differential per Neuro), now with markedly elevated troponin concerning for ACS. Pharmacy consulted to start heparin. Hx of afib deemed not a candidate for anticoagulation due to portal gastropathy, cirrhosis, and ongoing ETOH use. Hg wnl, plt 110. SCr down 3.36, CrCl<30. Previously on heparin SQ for VTE ppx - last dose at 1324. No current bleeding issues documented.  Will not bolus and will target lower goal for now with stroke still a possibility.  Goal of Therapy:  Heparin level 0.3-0.5 units/ml Monitor platelets by anticoagulation protocol: Yes   Plan:  No bolus Start heparin at 1250 units/h 8h heparin level Daily heparin level/CBC Monitor s/sx bleeding F/u Cardiology plans   Elicia Lamp, PharmD, BCPS Clinical Pharmacist 04/28/2017 3:55 PM

## 2017-04-28 NOTE — Progress Notes (Signed)
PULMONARY / CRITICAL CARE MEDICINE   Name: Justin Parsons MRN: [id number] DOB: 1941/11/06    ADMISSION DATE:  04/27/2017  CHIEF COMPLAINT:  Altered mental status  SUMMARY:  76 y/o man with notable history of CKD who developed a pre-syncopal episode after working out his field today. 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 He had not been feeling well the last few days, and after he became acutely ill, he progressively worsened over a few hours with worsening encephalopathy and aphasia. His bradycardia and hypotension apparently resolved.  SUBJECTIVE:  RN reports pt delirious > intermittently will yell out "My brakes don't work".  Pt intermittently will report chest pain > midsternal but unable to describe further.    VITAL SIGNS: BP (!) 130/92   Pulse 87   Temp 97.7 F (36.5 C) (Axillary)   Resp (!) 28   Ht 5' 8"  (1.727 m)   Wt 279 lb 12.2 oz (126.9 kg)   SpO2 91%   BMI 42.54 kg/m   HEMODYNAMICS:    VENTILATOR SETTINGS:    INTAKE / OUTPUT: I/O last 3 completed shifts: In: 2251.3 [I.V.:201.3; IV CHYIFOYDX:4128] Out: 7867 [Urine:1560]  PHYSICAL EXAMINATION: General: elderly male in NAD, lying in bed HEENT: MM pink/moist, fair dentition, R eye prosthesis  Neuro: Awake, alert, able to answer orientation questions  CV: s1s2 irr irr, no m/r/g PULM: even/non-labored, lungs bilaterally clear EH:MCNO, non-tender, bsx4 active  Extremities: warm/dry, no edema  Skin: no rashes or lesions   LABS:  BMET  Recent Labs Lab 04/27/17 2129 04/28/17 0042  NA 137 138  K 6.1* 5.5*  CL 107 110  CO2 20* 20*  BUN 51* 50*  CREATININE 3.66* 3.36*  GLUCOSE 108* 105*    Electrolytes  Recent Labs Lab 04/27/17 2129 04/28/17 0042  CALCIUM 8.4* 8.0*  MG  --  1.6*  PHOS  --  5.1*    CBC  Recent Labs Lab 04/28/17 0042  WBC 13.3*  HGB 14.0  HCT 43.4  PLT 110*    Coag's  Recent Labs Lab  04/28/17 0042  INR 1.25    Sepsis Markers No results for input(s): LATICACIDVEN, PROCALCITON, O2SATVEN in the last 168 hours.  ABG No results for input(s): PHART, PCO2ART, PO2ART in the last 168 hours.  Liver Enzymes No results for input(s): AST, ALT, ALKPHOS, BILITOT, ALBUMIN in the last 168 hours.  Cardiac Enzymes No results for input(s): TROPONINI, PROBNP in the last 168 hours.  Glucose No results for input(s): GLUCAP in the last 168 hours.  Imaging Mr Virgel Paling BS Contrast  Result Date: 04/27/2017 CLINICAL DATA:  Acute onset of altered mental status and aphasia. EXAM: MRI HEAD WITHOUT CONTRAST MRA HEAD WITHOUT CONTRAST TECHNIQUE: Multiplanar, multiecho pulse sequences of the brain and surrounding structures were obtained without intravenous contrast. Angiographic images of the head were obtained using MRA technique without contrast. COMPARISON:  CT head without contrast from the same day. FINDINGS: MRI HEAD FINDINGS Brain: The diffusion-weighted images demonstrate no acute or subacute infarction. The study is moderately degraded by patient motion. This limits detection of small lesions. Mild to moderate periventricular white matter changes are noted bilaterally. The brainstem and cerebellum are normal. Vascular: Flow is present in the major intracranial arteries release the level of the MCA bifurcations and basilar tip. Skull and upper cervical spine: The skullbase is within normal limits. The craniocervical junction is unremarkable. Sinuses/Orbits: A remote left orbital floor  fracture is noted. The paranasal sinuses are otherwise clear. A prosthetic right globe is noted. MRA HEAD FINDINGS Time-of-flight images are significantly degraded by patient motion. Flow is present in the internal carotid artery is new the MCA bifurcations bilaterally. Flow is present in both vertebral arteries the basilar tip. Branch vessel signal is obscured by patient motion. IMPRESSION: 1. No acute infarct. 2.  Mild-to-moderate white matter disease likely reflects the sequela of chronic microvascular ischemia. 3. The study is moderately degraded by patient motion. 4. No significant proximal stenosis or occlusion at the circle of Willis. Branch vessel evaluation cannot be performed secondary to significant patient motion on the time-of-flight images. These results were discussed in person at the time of interpretation on 04/27/2017 at 8:35 Pm to Dr. Roland Rack , who verbally acknowledged these results. Electronically Signed   By: San Morelle M.D.   On: 04/27/2017 21:09   Mr Brain Wo Contrast  Result Date: 04/27/2017 CLINICAL DATA:  Acute onset of altered mental status and aphasia. EXAM: MRI HEAD WITHOUT CONTRAST MRA HEAD WITHOUT CONTRAST TECHNIQUE: Multiplanar, multiecho pulse sequences of the brain and surrounding structures were obtained without intravenous contrast. Angiographic images of the head were obtained using MRA technique without contrast. COMPARISON:  CT head without contrast from the same day. FINDINGS: MRI HEAD FINDINGS Brain: The diffusion-weighted images demonstrate no acute or subacute infarction. The study is moderately degraded by patient motion. This limits detection of small lesions. Mild to moderate periventricular white matter changes are noted bilaterally. The brainstem and cerebellum are normal. Vascular: Flow is present in the major intracranial arteries release the level of the MCA bifurcations and basilar tip. Skull and upper cervical spine: The skullbase is within normal limits. The craniocervical junction is unremarkable. Sinuses/Orbits: A remote left orbital floor fracture is noted. The paranasal sinuses are otherwise clear. A prosthetic right globe is noted. MRA HEAD FINDINGS Time-of-flight images are significantly degraded by patient motion. Flow is present in the internal carotid artery is new the MCA bifurcations bilaterally. Flow is present in both vertebral arteries  the basilar tip. Branch vessel signal is obscured by patient motion. IMPRESSION: 1. No acute infarct. 2. Mild-to-moderate white matter disease likely reflects the sequela of chronic microvascular ischemia. 3. The study is moderately degraded by patient motion. 4. No significant proximal stenosis or occlusion at the circle of Willis. Branch vessel evaluation cannot be performed secondary to significant patient motion on the time-of-flight images. These results were discussed in person at the time of interpretation on 04/27/2017 at 8:35 Pm to Dr. Roland Rack , who verbally acknowledged these results. Electronically Signed   By: San Morelle M.D.   On: 04/27/2017 21:09   Dg Chest Port 1 View  Result Date: 04/28/2017 CLINICAL DATA:  Renal failure EXAM: PORTABLE CHEST 1 VIEW COMPARISON:  Yesterday FINDINGS: Mediastinum remains prominent. Heart remains enlarged. Low volumes and bibasilar atelectasis. No pneumothorax. No pleural effusion. IMPRESSION: Cardiomegaly and bibasilar atelectasis. Electronically Signed   By: Marybelle Killings M.D.   On: 04/28/2017 07:23   Ct Head Code Stroke Wo Contrast  Result Date: 04/27/2017 CLINICAL DATA:  Code stroke.  Intermittent aphasia. EXAM: CT HEAD WITHOUT CONTRAST TECHNIQUE: Contiguous axial images were obtained from the base of the skull through the vertex without intravenous contrast. COMPARISON:  CT head from the same day at [facility]. CT head without contrast 06/04/2012 at [place]: Brain: No acute infarct, hemorrhage, or mass lesion is present. Mild atrophy and white matter changes are  stable. No focal cortical defect is evident. The basal ganglia and insular ribbon are intact. Vascular: Vessels are somewhat hyperdense diffusely without significant asymmetry. Atherosclerotic calcifications are present in the cavernous internal carotid artery's. Skull: Calvarium is intact. No focal lytic or blastic lesions are present. Sinuses/Orbits:  Paranasal sinuses are clear. A remote left orbital floor fracture is again noted. There is no evidence for entrapment of the inferior rectus muscle. The globes and orbits are otherwise within normal limits. ASPECTS [facility] Stroke Program Early CT Score) - Ganglionic level infarction (caudate, lentiform nuclei, internal capsule, insula, M1-M3 cortex): 7/7 - Supraganglionic infarction (M4-M6 cortex): 3/3 Total score (0-10 with 10 being normal): 10/10 IMPRESSION: 1. No acute intracranial abnormality or significant interval change. 2. Stable atrophy and white matter disease. 3. ASPECTS is 10/10 These results were pager texted at the time of interpretation on 04/27/2017 at 7:14 pm to Dr. Leonel Ramsay. Electronically Signed   By: San Morelle M.D.   On: 04/27/2017 19:14    DISCUSSION: 76 y/o man with CKD presenting with AMS, AKI, and aphasia.   ASSESSMENT / PLAN:  AMS / Aphasia - No radiographic evidence of acute stroke, although this is still on the differential. The clinical scenario seems most consistent with progressive dehydration and exhaustion by working out in the heat (and further exacerbated by his AKI) leading to a pre-syncopal low-output state which caused the "shock brain" as suggested by Dr. Leonel Ramsay. Infectious cause seems unlikely, although should he fail to improve, an LP should be considered.  He is on home neurontin which could be amplified.  Also, question if he is brewing his own moonshine (methanol) P: Supportive care Promote sleep / wake cycle EEG in progress  Frequent neuro assessment  Check serum methanol  AKI on CKD  Elevated CK  Hyperkalemia  P: Trend BMP / urinary output Replace electrolytes as indicated, Mg 6/10 Avoid nephrotoxic agents, ensure adequate renal perfusion NS @ 50 ml/hr  Trend CK  Chest Pain  Hx HTN P: ICU monitoring of BP  Assess EKG  Trend troponin    Hx Hiatal Hernia  Cirrhosis of Liver  Portal Vein Thrombosis  P: Clear liquid diet  as tolerated Aspiration precautions  PPI    Thrombocytopenia - suspect in setting of ETOH use  P: Monitor platelets    ETOH - at risk withdrawal, reportedly drinks moonshine / aka "creek water" Blindness in R Eye P: Monitor for withdrawal  May need precedex gtt  PRN haldol   Hypothyroidsim  P: Assess TSH   FAMILY  - Updates: No family at bedside   - Inter-disciplinary family meet or Palliative Care meeting due by: 6/17   Noe Gens, NP-C [place] [facility] Pgr: [phone] or if no answer [phone] 04/28/2017, 9:14 AM  Attending Note:  I have examined patient, reviewed labs, studies and notes. I have discussed the case with B Ollis, and I agree with the data and plans as amended above. 76 year old man with a history of chronic kidney disease who was admitted with altered mental status and encephalopathy. For at least some period of time he appeared to have a aphasia. Etiology not immediately clear. Admission he also had some hypotension and bradycardia. All of this was in the setting of working hard out in the heat on the day prior. Labs show acute on chronic renal insufficiency with an elevated BUN. On evaluation today he is hemodynamically stable, awake and oriented but has had hallucinations. He tells me that he realizes that  the hallucinations aren't real. He is obese, comfortable. His lungs are clear. His heart is irregular without a murmur. Abdomen benign. Etiology of his confusion and hemodynamic changes, presyncope unclear but suspect that this was dehydration and relative hypotension with renal and neurological dysfunction. He drinks moonshine, possibly 1 pint per week. Certainly at risk for methanol exposure. We will check for this. Receiving Haldol intermittently for his hallucinations. He is at risk for alcohol withdrawal we will do surveillance for this. Continue telemetry monitoring. Follow renal function for improvement with hydration.  Baltazar Apo, MD,  PhD 04/28/2017, 12:11 PM [place] [facility] [phone] or if no answer [phone]

## 2017-04-28 NOTE — H&P (Addendum)
PULMONARY / CRITICAL CARE MEDICINE   Name: Justin Parsons MRN: [id number] DOB: 1941/10/09    ADMISSION DATE:  04/27/2017  CHIEF COMPLAINT:  Altered mental status  HISTORY OF PRESENT ILLNESS:  Patient was initially admitted by cardiology, but on arrival seemed that issues were more medical/neurologic in nature. See H&P from Dr. [place] from today for complete details, but briefly, Justin Parsons is 76 y/o man with notable history of CKD who developed a pre-syncopal episode after working out his field today. 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 He had not been feeling well the last few days, and after he became acutely ill, he progressively worsened over a few hours with worsening encephalopathy and aphasia. His bradycardia and hypotension apparently resolved.   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  PAST SURGICAL HISTORY: He  has a past surgical history that includes Total hip arthroplasty (2002); Total knee arthroplasty (1999/2003); s/p eye implant (1997); Colonoscopy (08/2008); Colonoscopy (2004); Total hip arthroplasty (2006/2012); Esophagogastroduodenoscopy (02/11/2012); Joint replacement; Eye surgery; Cardiac catheterization (2003); Colonoscopy (N/A, 12/01/2014); Esophagogastroduodenoscopy (N/A, 12/01/2014); Esophagogastroduodenoscopy (N/A, 02/07/2015); and Esophageal dilation (N/A, 02/07/2015).  No Known Allergies  No current facility-administered medications on file prior to encounter.    Current Outpatient Prescriptions on File Prior to Encounter  Medication Sig  . allopurinol (ZYLOPRIM) 100 MG  tablet Take 100 mg by mouth daily.    Marland Kitchen amLODipine (NORVASC) 2.5 MG tablet Take 2.5 mg by mouth daily.    . Ascorbic Acid (VITAMIN C PO) Take 1 tablet by mouth daily.  Marland Kitchen aspirin EC 81 MG tablet Take 81 mg by mouth daily.  Marland Kitchen atenolol (TENORMIN) 50 MG tablet Take 50 mg by mouth Daily.  . diclofenac (VOLTAREN) 75 MG EC tablet Take 75 mg by mouth 2 (two) times daily.  Marland Kitchen gabapentin (NEURONTIN) 300 MG capsule Take 300 mg by mouth 2 (two) times daily.   Marland Kitchen omeprazole (PRILOSEC) 40 MG capsule Take 1 capsule (40 mg total) by mouth 2 (two) times daily.  Marland Kitchen oxymetazoline (AFRIN) 0.05 % nasal spray Place 1 spray into both nostrils 2 (two) times daily as needed for congestion.    FAMILY HISTORY:  His indicated that his mother is deceased. He indicated that his father is deceased. He indicated that the status of his sister is unknown. He indicated that the status of his neg hx is unknown.    SOCIAL HISTORY: He  reports that he has never smoked. He has never used smokeless tobacco. He reports that he drinks alcohol. He reports that he does not use drugs.  REVIEW OF SYSTEMS:   Unable to obtain 2/2 aphasia.  VITAL SIGNS: BP 135/71 (BP Location: Left Arm)   Pulse 95   Temp 99 F (37.2 C) (Axillary)   Resp (!) 26   Ht 5' 8"  (1.727 m)   Wt 279 lb 12.2 oz (126.9 kg)   SpO2 96%   BMI 42.54 kg/m   HEMODYNAMICS:    VENTILATOR SETTINGS:    INTAKE / OUTPUT: No intake/output data recorded.  PHYSICAL EXAMINATION: General:  Elderly obese man, mildly agitated Neuro:  Awake, alert, but unable to consistently follow simple commands. Speech  is unintelligible and slurred. HEENT:  Dry MM Cardiovascular:  Normal HR Lungs:  CTA Abdomen:  Obese Musculoskeletal:  No deformities Skin:  No rashes on visible skin  LABS:  BMET  Recent Labs Lab 04/27/17 2129 04/28/17 0042  NA 137 138  K 6.1* 5.5*  CL 107 110  CO2 20* 20*  BUN 51* 50*  CREATININE 3.66* 3.36*  GLUCOSE 108* 105*     Electrolytes  Recent Labs Lab 04/27/17 2129 04/28/17 0042  CALCIUM 8.4* 8.0*  MG  --  1.6*  PHOS  --  5.1*    CBC  Recent Labs Lab 04/28/17 0042  WBC 13.3*  HGB 14.0  HCT 43.4  PLT 110*    Coag's  Recent Labs Lab 04/28/17 0042  INR 1.25    Sepsis Markers No results for input(s): LATICACIDVEN, PROCALCITON, O2SATVEN in the last 168 hours.  ABG No results for input(s): PHART, PCO2ART, PO2ART in the last 168 hours.  Liver Enzymes No results for input(s): AST, ALT, ALKPHOS, BILITOT, ALBUMIN in the last 168 hours.  Cardiac Enzymes No results for input(s): TROPONINI, PROBNP in the last 168 hours.  Glucose No results for input(s): GLUCAP in the last 168 hours.  Imaging Mr Virgel Paling WU Contrast  Result Date: 04/27/2017 CLINICAL DATA:  Acute onset of altered mental status and aphasia. EXAM: MRI HEAD WITHOUT CONTRAST MRA HEAD WITHOUT CONTRAST TECHNIQUE: Multiplanar, multiecho pulse sequences of the brain and surrounding structures were obtained without intravenous contrast. Angiographic images of the head were obtained using MRA technique without contrast. COMPARISON:  CT head without contrast from the same day. FINDINGS: MRI HEAD FINDINGS Brain: The diffusion-weighted images demonstrate no acute or subacute infarction. The study is moderately degraded by patient motion. This limits detection of small lesions. Mild to moderate periventricular white matter changes are noted bilaterally. The brainstem and cerebellum are normal. Vascular: Flow is present in the major intracranial arteries release the level of the MCA bifurcations and basilar tip. Skull and upper cervical spine: The skullbase is within normal limits. The craniocervical junction is unremarkable. Sinuses/Orbits: A remote left orbital floor fracture is noted. The paranasal sinuses are otherwise clear. A prosthetic right globe is noted. MRA HEAD FINDINGS Time-of-flight images are significantly degraded by patient  motion. Flow is present in the internal carotid artery is new the MCA bifurcations bilaterally. Flow is present in both vertebral arteries the basilar tip. Branch vessel signal is obscured by patient motion. IMPRESSION: 1. No acute infarct. 2. Mild-to-moderate white matter disease likely reflects the sequela of chronic microvascular ischemia. 3. The study is moderately degraded by patient motion. 4. No significant proximal stenosis or occlusion at the circle of Willis. Branch vessel evaluation cannot be performed secondary to significant patient motion on the time-of-flight images. These results were discussed in person at the time of interpretation on 04/27/2017 at 8:35 Pm to Dr. Roland Rack , who verbally acknowledged these results. Electronically Signed   By: San Morelle M.D.   On: 04/27/2017 21:09   Mr Brain Wo Contrast  Result Date: 04/27/2017 CLINICAL DATA:  Acute onset of altered mental status and aphasia. EXAM: MRI HEAD WITHOUT CONTRAST MRA HEAD WITHOUT CONTRAST TECHNIQUE: Multiplanar, multiecho pulse sequences of the brain and surrounding structures were obtained without intravenous contrast. Angiographic images of the head were obtained using MRA technique without contrast. COMPARISON:  CT head without contrast from the same day. FINDINGS: MRI HEAD FINDINGS Brain: The diffusion-weighted images demonstrate no acute or subacute infarction. The study is moderately  degraded by patient motion. This limits detection of small lesions. Mild to moderate periventricular white matter changes are noted bilaterally. The brainstem and cerebellum are normal. Vascular: Flow is present in the major intracranial arteries release the level of the MCA bifurcations and basilar tip. Skull and upper cervical spine: The skullbase is within normal limits. The craniocervical junction is unremarkable. Sinuses/Orbits: A remote left orbital floor fracture is noted. The paranasal sinuses are otherwise clear. A  prosthetic right globe is noted. MRA HEAD FINDINGS Time-of-flight images are significantly degraded by patient motion. Flow is present in the internal carotid artery is new the MCA bifurcations bilaterally. Flow is present in both vertebral arteries the basilar tip. Branch vessel signal is obscured by patient motion. IMPRESSION: 1. No acute infarct. 2. Mild-to-moderate white matter disease likely reflects the sequela of chronic microvascular ischemia. 3. The study is moderately degraded by patient motion. 4. No significant proximal stenosis or occlusion at the circle of Willis. Branch vessel evaluation cannot be performed secondary to significant patient motion on the time-of-flight images. These results were discussed in person at the time of interpretation on 04/27/2017 at 8:35 Pm to Dr. Roland Rack , who verbally acknowledged these results. Electronically Signed   By: San Morelle M.D.   On: 04/27/2017 21:09   Ct Head Code Stroke Wo Contrast  Result Date: 04/27/2017 CLINICAL DATA:  Code stroke.  Intermittent aphasia. EXAM: CT HEAD WITHOUT CONTRAST TECHNIQUE: Contiguous axial images were obtained from the base of the skull through the vertex without intravenous contrast. COMPARISON:  CT head from the same day at [facility]. CT head without contrast 06/04/2012 at [place]: Brain: No acute infarct, hemorrhage, or mass lesion is present. Mild atrophy and white matter changes are stable. No focal cortical defect is evident. The basal ganglia and insular ribbon are intact. Vascular: Vessels are somewhat hyperdense diffusely without significant asymmetry. Atherosclerotic calcifications are present in the cavernous internal carotid artery's. Skull: Calvarium is intact. No focal lytic or blastic lesions are present. Sinuses/Orbits: Paranasal sinuses are clear. A remote left orbital floor fracture is again noted. There is no evidence for entrapment of the inferior rectus muscle. The  globes and orbits are otherwise within normal limits. ASPECTS [facility] Stroke Program Early CT Score) - Ganglionic level infarction (caudate, lentiform nuclei, internal capsule, insula, M1-M3 cortex): 7/7 - Supraganglionic infarction (M4-M6 cortex): 3/3 Total score (0-10 with 10 being normal): 10/10 IMPRESSION: 1. No acute intracranial abnormality or significant interval change. 2. Stable atrophy and white matter disease. 3. ASPECTS is 10/10 These results were pager texted at the time of interpretation on 04/27/2017 at 7:14 pm to Dr. Leonel Ramsay. Electronically Signed   By: San Morelle M.D.   On: 04/27/2017 19:14    DISCUSSION: 77 y/o man with CKD presenting with AMS, AKI, and aphasia.   ASSESSMENT / PLAN:  AMS / Aphasia: No radiographic evidence of acute stroke, although this is still on the differential. The clinical scenario seems most consistent with progressive dehydration and exhaustion by working out in the heat (and further recommended by his AKI) leading to a pre-syncopal low-output state which caused the "shock brain" as suggested by Dr. Leonel Ramsay. Infectious cause seems unlikely, although should he fail to improve, an LP should be considered. Also could consider EEG for eval of NCS.  AKI on CKD: Due to exposure. Will replete with 2L NS and re-eval. May be theraputic for above.  FAMILY  - Updates:   - Inter-disciplinary family meet or Palliative  Care meeting due by: 6/17  CRITICAL CARE Performed by: Luz Brazen   Total critical care time: 45 minutes  Critical care time was exclusive of separately billable procedures and treating other patients.  Critical care was necessary to treat or prevent imminent or life-threatening deterioration.  Critical care was time spent personally by me on the following activities: development of treatment plan with patient and/or surrogate as well as nursing, discussions with consultants, evaluation of patient's response to treatment,  examination of patient, obtaining history from patient or surrogate, ordering and performing treatments and interventions, ordering and review of laboratory studies, ordering and review of radiographic studies, pulse oximetry and re-evaluation of patient's condition.   Pulmonary and [place] Pager: [phone]  04/28/2017, 3:11 AM

## 2017-04-28 NOTE — Progress Notes (Signed)
Updated PCCM with pt c/o CP. Spoke with Dr.Taylor -cardiology order for Lasix received and will await chest x-ray results.

## 2017-04-28 NOTE — Procedures (Signed)
Electroencephalogram (EEG) Report  Date of study: 04/28/17  Requesting clinician: Melba Coon M.D.  Reason for study: Evaluate for seizure  Brief clinical history: This is a 76 year old man with acute onset of encephalopathy of uncertain etiology. EEG is requested for further evaluation.  Medications:  Current Facility-Administered Medications:  .  0.9 %  sodium chloride infusion, , Intravenous, Continuous, Ollis, Brandi L, NP, Last Rate: 50 mL/hr at 04/28/17 1200 .  gi cocktail (Maalox,Lidocaine,Donnatal), 30 mL, Oral, BID PRN, Ollis, Brandi L, NP, 30 mL at 04/28/17 0900 .  haloperidol lactate (HALDOL) injection 1 mg, 1 mg, Intravenous, Q6H PRN, Ollis, Brandi L, NP, 1 mg at 04/28/17 1056 .  heparin injection 5,000 Units, 5,000 Units, Subcutaneous, Q8H, Luz Brazen, MD, 5,000 Units at 04/28/17 1324 .  levalbuterol (XOPENEX) nebulizer solution 0.63 mg, 0.63 mg, Nebulization, Q6H PRN, Collene Gobble, MD, 0.63 mg at 04/28/17 0356 .  ondansetron (ZOFRAN) injection 4 mg, 4 mg, Intravenous, Q8H PRN, Deterding, Guadelupe Sabin, MD .  pantoprazole (PROTONIX) EC tablet 40 mg, 40 mg, Oral, Daily, [place], Peter M, MD, 40 mg at 04/28/17 1323  Description: This is a routine EEG performed using standard international 10-20 electrode placement. A total of 18 channels are recorded, including one for the EKG. the patient was awake and drowsy during the recording.  Activating Maneuvers: None  Findings:  The EKG channel demonstrates an irregularly irregular rhythm with a rate of 80-90 beats per minute.   The background consists primarily of theta frequencies averaging 6-7 hertz. At times, he does have some alpha activity with his best frequency being 8-9 Hz but this is not sustained. This background is reactive. Voltages are mildly reduced. He is restless and moving through much of the recording resulting in some muscle artifact. This is relatively minor and does not limit interpretation of the  study.  There are no focal asymmetries. No epileptiform discharges are present. No seizures are recorded.   Drowsiness is recorded and is normal in appearance.    Impression: This is an abnormal EEG due to mild diffuse generalized slowing. No epileptiform abnormalities or seizures.  Clinical correlation: This EEG demonstrates evidence of mild cortical dysfunction but is nonspecific as to the etiology. Considerations would include metabolic derangements, medication effect, underlying neurodegenerative process, or global hypoperfusion/hypoxia. Clinical correlation is advised.   Melba Coon, MD [facility]

## 2017-04-28 NOTE — Progress Notes (Signed)
CRITICAL VALUE ALERT  Critical Value:  Troponin 40.36  Date & Time Notied:  04/28/2017 3:21 PM  Provider Notified: Dr. Tamala Julian  Orders Received/Actions taken: 81 mg ASA ordered by Dr. Tamala Julian of [facility] and instructed to contact Dr. Harrington Challenger of cardiology who ordered a heparin gtt.

## 2017-04-28 NOTE — Progress Notes (Signed)
eLink Physician-Brief Progress Note Patient Name: Justin Parsons DOB: October 19, 1941 MRN: [id number]   Date of Service  04/28/2017  HPI/Events of Note  Hypomag  eICU Interventions  Mag replaced     Intervention Category Intermediate Interventions: Electrolyte abnormality - evaluation and management  Lavada Langsam 04/28/2017, 1:16 AM

## 2017-04-28 NOTE — Progress Notes (Signed)
EEG completed; results pending.    

## 2017-04-28 NOTE — Progress Notes (Addendum)
    Called by RN given troponin of 40. I went to talk to patient and he is very agitated and talking incoherently. Felt to be in acute withdrawal and hallucinating. He is not having chest pain. 12 lead showed no ST elevation. I discussed with pharmacy and we both agree that IV heparin is better than Lovenox so we will switch that given high risk of bleeding and renal insufficieny. Reviewed case with Dr. Harrington Challenger.  He is not a good cath candidate and would continue monitoring at this time.   Angelena Form PA-C  [facility]

## 2017-04-28 NOTE — Evaluation (Signed)
Clinical/Bedside Swallow Evaluation Patient Details  Name: Justin Parsons MRN: [id number] Date of Birth: February 22, 1941  Today's Date: 04/28/2017 Time: SLP Start Time (ACUTE ONLY): 1155 SLP Stop Time (ACUTE ONLY): 1210 SLP Time Calculation (min) (ACUTE ONLY): 15 min  Past Medical History:  Past Medical History:  Diagnosis Date  . Acute renal failure (ARF) ([place]) 05/2012  . Adenomatous polyp of colon   . 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 Arthritis   . 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 Cirrhosis of liver ([place])    completed Hep A/B vaccinations 2015/2016  . CKD (chronic kidney disease)   . Hiatal hernia   . HTN (hypertension)   . Hypothyroidism   . 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 TIA (transient ischemic attack)    Past Surgical History:  Past Surgical History:  Procedure Laterality Date  . CARDIAC CATHETERIZATION  2003  . COLONOSCOPY  08/2008   normal, repeat exam in 5-7 years  . COLONOSCOPY  2004   rectal adenomatous polyp  . COLONOSCOPY N/A 12/01/2014   STM:[id number] rectum/elongated colon  . ESOPHAGEAL DILATION N/A 02/07/2015   Procedure: ESOPHAGEAL DILATION;  Surgeon: Daneil Dolin, MD;  [facility];  Service: Endoscopy;  Laterality: N/A;  . ESOPHAGOGASTRODUODENOSCOPY  02/11/2012   Dr. Gala Romney: portal gastropathy, gastric erosions, esophageal ulcerations likely pill-induced, surveillance in 2 years  . ESOPHAGOGASTRODUODENOSCOPY N/A 12/01/2014   Dr. Volney American esophageal stricture dilated with the scope passage, portal gastropathy, negative H.pylori on gastric biopsies, esophageal biopsies benign  . ESOPHAGOGASTRODUODENOSCOPY N/A 02/07/2015   Procedure: ESOPHAGOGASTRODUODENOSCOPY (EGD);  Surgeon: Daneil Dolin, MD;  Location: [facility];  Service: Endoscopy;  Laterality: N/A;  1115  . EYE SURGERY    . JOINT REPLACEMENT    . s/p eye implant  1997   artificial eye, right  . TOTAL HIP ARTHROPLASTY  2002  . TOTAL HIP ARTHROPLASTY  2006/2012   revision  in 2012  . TOTAL KNEE ARTHROPLASTY  1999/2003   left/right   HPI:  76 y.o.malewith history of Afib- probably chronic, cirrhosis with portal gastropathy, CKD transferred from [facility] with acute mental status changes, global aphasia, hypotension, and initially bradycardia. Per Neuro evaluation including CT and MRI this is less likely to be acute CVA. ? "shock brain" due to hypotension on arrival. Pt with history of esophageal dysphagia; EGD 02/07/15 revealed Schatzki's ring with proximal peptic stricture-status post [facility] dilation. Hiatal hernia. Portal gastropathy.   Assessment / Plan / Recommendation Clinical Impression  Patient presents with moderate risk for aspiration in the setting of altered mental status and history of esophageal dysphagia. Prior to evaluation, pt on clear liquids per MD. RN reported pt coughing, stating "It feels like it's going down the wrong pipe." Pt is alert, oriented x4 but confused, has been hallucinating per RN. Following basic commands, no focal cranial nerve deficits apparent during oral mechanism examination. With sips of thin liquid, pt presents with wet vocal quality, immediate throat clearing and delayed cough, suggestive of reduced airway protection. No overt signs of aspiration with nectar thick liquids despite challenging with consecutive straw sips. Recommend clear liquids, thickened to nectar consistency at this time given current mentation and signs of aspiration with thin liquids. Given pt's esophageal history, long-term use of thickened liquids not recommended. Anticipate advancement of solids and liquids as mental status improves. SLP will f/u for tolerance and advancement as appropriate.  SLP Visit Diagnosis: Dysphagia, unspecified (R13.10)    Aspiration Risk  Moderate aspiration risk    Diet Recommendation Nectar-thick liquid   Liquid Administration via: Straw;Cup Medication Administration: Whole meds with puree Supervision: Full  supervision/cueing for compensatory strategies;Staff to assist with self feeding Compensations: Slow rate;Small sips/bites;Minimize environmental distractions Postural Changes: Seated upright at 90 degrees;Remain upright for at least 30 minutes after po intake    Other  Recommendations Oral Care Recommendations: Oral care QID Other Recommendations: Order thickener from pharmacy;Prohibited food (jello, ice cream, thin soups);Remove water pitcher   Follow up Recommendations Other (comment) (TBD)      Frequency and Duration min 2x/week  2 weeks       Prognosis Prognosis for Safe Diet Advancement: Good Barriers to Reach Goals: Cognitive deficits      Swallow Study   General Date of Onset: 04/27/17 HPI: 76 y.o.malewith history of Afib- probably chronic, cirrhosis with portal gastropathy, CKD transferred from [facility] with acute mental status changes, global aphasia, hypotension, and initially bradycardia. Per Neuro evaluation including CT and MRI this is less likely to be acute CVA. ? "shock brain" due to hypotension on arrival. Pt with history of esophageal dysphagia; EGD 02/07/15 revealed Schatzki's ring with proximal peptic stricture-status post [facility] dilation. Hiatal hernia. Portal gastropathy. Type of Study: Bedside Swallow Evaluation Previous Swallow Assessment: EGD 2016, see HPI Diet Prior to this Study: Thin liquids Temperature Spikes Noted: No Respiratory Status: Nasal cannula History of Recent Intubation: No Behavior/Cognition: Alert;Cooperative;Confused;Pleasant mood Oral Cavity Assessment: Within Functional Limits Oral Care Completed by SLP: Recent completion by staff Oral Cavity - Dentition: Adequate natural dentition Vision: Functional for self-feeding Self-Feeding Abilities: Needs assist Patient Positioning: Upright in bed Baseline Vocal Quality: Normal Volitional Cough: Strong Volitional Swallow: Able to elicit    Oral/Motor/Sensory Function  Overall Oral Motor/Sensory Function: Within functional limits   Ice Chips Ice chips: Within functional limits Presentation: Spoon   Thin Liquid Thin Liquid: Impaired Presentation: Straw Oral Phase Impairments: Poor awareness of bolus Pharyngeal  Phase Impairments: Wet Vocal Quality;Multiple swallows;Throat Clearing - Immediate;Cough - Delayed    Nectar Thick Nectar Thick Liquid: Within functional limits Presentation: Straw   Honey Thick Honey Thick Liquid: Not tested   Puree Puree: Not tested   Solid   GO   Deneise Lever, [place], CCC-SLP Speech-Language Pathologist [phone] Solid: Not tested        Aliene Altes 04/28/2017,1:54 PM

## 2017-04-29 ENCOUNTER — Inpatient Hospital Stay (HOSPITAL_COMMUNITY): Payer: Medicare Other

## 2017-04-29 DIAGNOSIS — I481 Persistent atrial fibrillation: Secondary | ICD-10-CM

## 2017-04-29 DIAGNOSIS — R579 Shock, unspecified: Secondary | ICD-10-CM

## 2017-04-29 DIAGNOSIS — F10231 Alcohol dependence with withdrawal delirium: Secondary | ICD-10-CM

## 2017-04-29 DIAGNOSIS — N179 Acute kidney failure, unspecified: Secondary | ICD-10-CM

## 2017-04-29 DIAGNOSIS — I5031 Acute diastolic (congestive) heart failure: Secondary | ICD-10-CM

## 2017-04-29 DIAGNOSIS — I214 Non-ST elevation (NSTEMI) myocardial infarction: Principal | ICD-10-CM | POA: Diagnosis present

## 2017-04-29 DIAGNOSIS — F10931 Alcohol use, unspecified with withdrawal delirium: Secondary | ICD-10-CM

## 2017-04-29 LAB — BASIC METABOLIC PANEL
Anion gap: 8 (ref 5–15)
Anion gap: 12 (ref 5–15)
BUN: 49 mg/dL — ABNORMAL HIGH (ref 6–20)
BUN: 47 mg/dL — AB (ref 6–20)
CALCIUM: 8.3 mg/dL — AB (ref 8.9–10.3)
CHLORIDE: 107 mmol/L (ref 101–111)
CO2: 24 mmol/L (ref 22–32)
CO2: 19 mmol/L — AB (ref 22–32)
CREATININE: 3.25 mg/dL — AB (ref 0.61–1.24)
CREATININE: 3.41 mg/dL — AB (ref 0.61–1.24)
Calcium: 8.5 mg/dL — ABNORMAL LOW (ref 8.9–10.3)
Chloride: 104 mmol/L (ref 101–111)
GFR calc non Af Amer: 16 mL/min — ABNORMAL LOW (ref >60)
GFR calc non Af Amer: 17 mL/min — ABNORMAL LOW (ref >60)
GFR, EST AFRICAN AMERICAN: 20 mL/min — AB (ref >60)
GFR, EST AFRICAN AMERICAN: 19 mL/min — AB (ref >60)
Glucose, Bld: 114 mg/dL — ABNORMAL HIGH (ref 65–99)
Glucose, Bld: 88 mg/dL (ref 65–99)
POTASSIUM: 5 mmol/L (ref 3.5–5.1)
Potassium: 4.7 mmol/L (ref 3.5–5.1)
SODIUM: 136 mmol/L (ref 135–145)
Sodium: 138 mmol/L (ref 135–145)

## 2017-04-29 LAB — MAGNESIUM: MAGNESIUM: 1.9 mg/dL (ref 1.7–2.4)

## 2017-04-29 LAB — CK TOTAL AND CKMB (NOT AT ARMC)
CK TOTAL: 299 U/L (ref 49–397)
CK, MB: 12.8 ng/mL — ABNORMAL HIGH (ref 0.5–5.0)
Relative Index: 4.3 — ABNORMAL HIGH (ref 0.0–2.5)

## 2017-04-29 LAB — CBC
HCT: 42.2 % (ref 39.0–52.0)
Hemoglobin: 13.5 g/dL (ref 13.0–17.0)
MCH: 29.2 pg (ref 26.0–34.0)
MCHC: 32 g/dL (ref 30.0–36.0)
MCV: 91.1 fL (ref 78.0–100.0)
Platelets: 133 10*3/uL — ABNORMAL LOW (ref 150–400)
RBC: 4.63 MIL/uL (ref 4.22–5.81)
RDW: 16.9 % — AB (ref 11.5–15.5)
WBC: 16.9 10*3/uL — ABNORMAL HIGH (ref 4.0–10.5)

## 2017-04-29 LAB — VOLATILES,BLD-ACETONE,ETHANOL,ISOPROP,METHANOL
Acetone, blood: NEGATIVE % (ref 0.000–0.010)
Ethanol, blood: NEGATIVE % (ref 0.000–0.010)
Isopropanol, blood: NEGATIVE % (ref 0.000–0.010)
METHANOL, BLOOD: NEGATIVE % (ref 0.000–0.010)

## 2017-04-29 LAB — HEPARIN LEVEL (UNFRACTIONATED)
HEPARIN UNFRACTIONATED: 0.4 [IU]/mL (ref 0.30–0.70)
HEPARIN UNFRACTIONATED: 0.46 [IU]/mL (ref 0.30–0.70)

## 2017-04-29 LAB — PHOSPHORUS: PHOSPHORUS: 4.6 mg/dL (ref 2.5–4.6)

## 2017-04-29 MED ORDER — LEVOTHYROXINE SODIUM 100 MCG IV SOLR
31.2500 ug | Freq: Every day | INTRAVENOUS | Status: DC
  Administered 2017-04-29: 31.25 ug via INTRAVENOUS
  Filled 2017-04-29: qty 5

## 2017-04-29 MED ORDER — THIAMINE HCL 100 MG/ML IJ SOLN: 100.0000 mg | Freq: Every day | INTRAMUSCULAR | Status: DC

## 2017-04-29 MED ORDER — ATORVASTATIN CALCIUM 80 MG PO TABS
80.0000 mg | ORAL_TABLET | Freq: Every day | ORAL | Status: DC
  Administered 2017-04-29 – 2017-05-06 (×8): 80 mg via ORAL
  Filled 2017-04-29 (×7): qty 1

## 2017-04-29 MED ORDER — LACTATED RINGERS IV SOLN
INTRAVENOUS | Status: DC
  Administered 2017-04-29 – 2017-04-30 (×2): via INTRAVENOUS

## 2017-04-29 MED ORDER — MAGNESIUM SULFATE 2 GM/50ML IV SOLN
2.0000 g | Freq: Once | INTRAVENOUS | Status: AC
  Administered 2017-04-29: 2 g via INTRAVENOUS
  Filled 2017-04-29: qty 50

## 2017-04-29 MED ORDER — FOLIC ACID 5 MG/ML IJ SOLN
1.0000 mg | Freq: Every day | INTRAMUSCULAR | Status: DC
  Administered 2017-04-29: 1 mg via INTRAVENOUS
  Filled 2017-04-29 (×2): qty 0.2

## 2017-04-29 MED ORDER — THIAMINE HCL 100 MG/ML IJ SOLN
100.0000 mg | INTRAMUSCULAR | Status: DC
  Administered 2017-04-30 – 2017-05-05 (×4): 100 mg via INTRAVENOUS
  Filled 2017-04-29 (×2): qty 2
  Filled 2017-04-29: qty 1
  Filled 2017-04-29 (×2): qty 2

## 2017-04-29 MED ORDER — METOPROLOL TARTRATE 25 MG PO TABS: 25.0000 mg | ORAL_TABLET | Freq: Two times a day (BID) | ORAL | Status: DC

## 2017-04-29 MED ORDER — SODIUM CHLORIDE 0.9 % IV SOLN
0.0000 ug/min | INTRAVENOUS | Status: DC
  Administered 2017-04-29 (×2): 20 ug/min via INTRAVENOUS
  Administered 2017-04-29: 60 ug/min via INTRAVENOUS
  Filled 2017-04-29 (×4): qty 1

## 2017-04-29 MED ORDER — SODIUM CHLORIDE 0.9 % IV SOLN
500.0000 mg | Freq: Three times a day (TID) | INTRAVENOUS | Status: AC
  Administered 2017-04-29 (×3): 500 mg via INTRAVENOUS
  Filled 2017-04-29 (×3): qty 5

## 2017-04-29 NOTE — Care Management Note (Signed)
Case Management Note  Patient Details  Name: Justin Parsons MRN: [id number] Date of Birth: 1941/06/15  Subjective/Objective:   Pt admitted with acute encep                  Action/Plan:   PTA independent from home.  ETOH - will consult CSW   Expected Discharge Date:  04/30/17               Expected Discharge Plan:  Home/Self Care  In-House Referral:  Clinical Social Work  Discharge planning Services  CM Consult  Post Acute Care Choice:    Choice offered to:     DME Arranged:    DME Agency:     HH Arranged:    HH Agency:     Status of Service:     If discussed at [employer], dates discussed:    Additional Comments:  Maryclare Labrador, RN 04/29/2017, 9:08 AM

## 2017-04-29 NOTE — Progress Notes (Addendum)
DAILY PROGRESS NOTE   Patient Name: Justin Parsons Date of Encounter: 04/29/2017  Hospital Problem List   Active Problems:   ARF (acute renal failure) (HCC)   A-fib (HCC)   Acute diastolic CHF (congestive heart failure) ([place])   Acute encephalopathy   Confusion   Non-ST elevation (NSTEMI) myocardial infarction [facility]    Chief Complaint   Disoriented- somewhat agitated  Subjective   Seen by neuro today - thought to be a metabolic encephalopathy - possibly Wernicke's. Troponin declining from 40.0, consistent with NSTEMI. Echo yesterday shows EF 50-55%, severe LVH, no clear WMA's, moderate pulmonary hypertension. He remains hypotensive on neosynephrine. On IV heparin - not a cath candidate at this time d/t renal failure. C/O mild chest burning, non-radiating, no associated dypsnea.   Objective   Vitals:   04/29/17 0745 04/29/17 0758 04/29/17 0800 04/29/17 0900  BP: (!) 122/93  108/67 (!) 80/70  Pulse: (!) 102  (!) 109   Resp: (!) 29  (!) 34 19  Temp:  98.8 F (37.1 C)    TempSrc:  Axillary    SpO2: 97%  98% 98%  Weight:      Height:        Intake/Output Summary (Last 24 hours) at 04/29/17 0941 Last data filed at 04/29/17 0910  Gross per 24 hour  Intake          2520.29 ml  Output             3485 ml  Net          -964.71 ml   Filed Weights   04/27/17 2231 04/28/17 0149 04/29/17 0415  Weight: 279 lb 12.2 oz (126.9 kg) 279 lb 12.2 oz (126.9 kg) 279 lb 5.2 oz (126.7 kg)    Physical Exam   General appearance: Awake, confused, but occasionally coherent Neck: no carotid bruit and no JVD Lungs: clear to auscultation bilaterally Heart: irregularly irregular rhythm Abdomen: soft, non-tender; bowel sounds normal; no masses,  no organomegaly and obese Extremities: edema 1+ edema Pulses: 2+ and symmetric Skin: Skin color, texture, turgor normal. No rashes or lesions Neurologic: Mental status: Awake, confused, confabulating Psych: Difficult to assess  Inpatient  Medications    Scheduled Meds: . aspirin EC  81 mg Oral Daily  . folic acid  1 mg Intravenous Daily  . levothyroxine  31.25 mcg Intravenous Daily  . pantoprazole  40 mg Oral Daily  . [START ON 04/30/2017] thiamine injection  100 mg Intravenous Q24H    Continuous Infusions: . dexmedetomidine 0.8 mcg/kg/hr (04/29/17 0929)  . heparin 1,250 Units/hr (04/29/17 0700)  . lactated ringers 125 mL/hr at 04/29/17 0853  . magnesium sulfate 1 - 4 g bolus IVPB Stopped (04/29/17 0948)  . norepinephrine (LEVOPHED) Adult infusion Stopped (04/29/17 2202)  . phenylephrine (NEO-SYNEPHRINE) Adult infusion 40 mcg/min (04/29/17 0930)  . thiamine injection      PRN Meds: gi cocktail, levalbuterol, ondansetron, RESOURCE THICKENUP CLEAR   Labs   Results for orders placed or performed during the hospital encounter of 04/27/17 (from the past 48 hour(s))  MRSA PCR Screening     Status: None   Collection Time: 04/27/17  6:38 PM  Result Value Ref Range   MRSA by PCR NEGATIVE NEGATIVE    Comment:        The GeneXpert MRSA Assay (FDA approved for NASAL specimens only), is one component of a comprehensive MRSA colonization surveillance program. It is not intended to diagnose MRSA infection nor to guide or monitor  treatment for MRSA infections.   Basic metabolic panel     Status: Abnormal   Collection Time: 04/27/17  9:29 PM  Result Value Ref Range   Sodium 137 135 - 145 mmol/L   Potassium 6.1 (H) 3.5 - 5.1 mmol/L   Chloride 107 101 - 111 mmol/L   CO2 20 (L) 22 - 32 mmol/L   Glucose, Bld 108 (H) 65 - 99 mg/dL   BUN 51 (H) 6 - 20 mg/dL   Creatinine, Ser 3.66 (H) 0.61 - 1.24 mg/dL   Calcium 8.4 (L) 8.9 - 10.3 mg/dL   GFR calc non Af Amer 15 (L) >60 mL/min   GFR calc Af Amer 17 (L) >60 mL/min    Comment: (NOTE) The eGFR has been calculated using the CKD EPI equation. This calculation has not been validated in all clinical situations. eGFR's persistently <60 mL/min signify possible Chronic  Kidney Disease.    Anion gap 10 5 - 15  Ammonia     Status: None   Collection Time: 04/27/17  9:29 PM  Result Value Ref Range   Ammonia 18 9 - 35 umol/L  TSH     Status: Abnormal   Collection Time: 04/27/17  9:29 PM  Result Value Ref Range   TSH 4.853 (H) 0.350 - 4.500 uIU/mL    Comment: Performed by a 3rd Generation assay with a functional sensitivity of <=0.01 uIU/mL.  Basic metabolic panel     Status: Abnormal   Collection Time: 04/28/17 12:42 AM  Result Value Ref Range   Sodium 138 135 - 145 mmol/L   Potassium 5.5 (H) 3.5 - 5.1 mmol/L   Chloride 110 101 - 111 mmol/L   CO2 20 (L) 22 - 32 mmol/L   Glucose, Bld 105 (H) 65 - 99 mg/dL   BUN 50 (H) 6 - 20 mg/dL   Creatinine, Ser 3.36 (H) 0.61 - 1.24 mg/dL   Calcium 8.0 (L) 8.9 - 10.3 mg/dL   GFR calc non Af Amer 16 (L) >60 mL/min   GFR calc Af Amer 19 (L) >60 mL/min    Comment: (NOTE) The eGFR has been calculated using the CKD EPI equation. This calculation has not been validated in all clinical situations. eGFR's persistently <60 mL/min signify possible Chronic Kidney Disease.    Anion gap 8 5 - 15  Magnesium     Status: Abnormal   Collection Time: 04/28/17 12:42 AM  Result Value Ref Range   Magnesium 1.6 (L) 1.7 - 2.4 mg/dL  Phosphorus     Status: Abnormal   Collection Time: 04/28/17 12:42 AM  Result Value Ref Range   Phosphorus 5.1 (H) 2.5 - 4.6 mg/dL  CBC WITH DIFFERENTIAL     Status: Abnormal   Collection Time: 04/28/17 12:42 AM  Result Value Ref Range   WBC 13.3 (H) 4.0 - 10.5 K/uL   RBC 4.74 4.22 - 5.81 MIL/uL   Hemoglobin 14.0 13.0 - 17.0 g/dL   HCT 43.4 39.0 - 52.0 %   MCV 91.6 78.0 - 100.0 fL   MCH 29.5 26.0 - 34.0 pg   MCHC 32.3 30.0 - 36.0 g/dL   RDW 17.0 (H) 11.5 - 15.5 %   Platelets 110 (L) 150 - 400 K/uL    Comment: SPECIMEN CHECKED FOR CLOTS PLATELET COUNT CONFIRMED BY SMEAR    Neutrophils Relative % 76 %   Neutro Abs 10.1 (H) 1.7 - 7.7 K/uL   Lymphocytes Relative 16 %   Lymphs Abs 2.2 0.7 -  4.0  K/uL   Monocytes Relative 7 %   Monocytes Absolute 1.0 0.1 - 1.0 K/uL   Eosinophils Relative 0 %   Eosinophils Absolute 0.0 0.0 - 0.7 K/uL   Basophils Relative 0 %   Basophils Absolute 0.0 0.0 - 0.1 K/uL  Protime-INR     Status: Abnormal   Collection Time: 04/28/17 12:42 AM  Result Value Ref Range   Prothrombin Time 15.7 (H) 11.4 - 15.2 seconds   INR 1.25   CK     Status: Abnormal   Collection Time: 04/28/17 12:42 AM  Result Value Ref Range   Total CK 935 (H) 49 - 397 U/L  Troponin I     Status: Abnormal   Collection Time: 04/28/17  2:09 PM  Result Value Ref Range   Troponin I 40.36 (HH) <0.03 ng/mL    Comment: CRITICAL RESULT CALLED TO, READ BACK BY AND VERIFIED WITH: Debby Bud [id number]   Troponin I     Status: Abnormal   Collection Time: 04/28/17  7:30 PM  Result Value Ref Range   Troponin I 35.27 (HH) <0.03 ng/mL    Comment: CRITICAL VALUE NOTED.  VALUE IS CONSISTENT WITH PREVIOUSLY REPORTED AND CALLED VALUE.  Heparin level (unfractionated)     Status: None   Collection Time: 04/29/17 12:43 AM  Result Value Ref Range   Heparin Unfractionated 0.46 0.30 - 0.70 IU/mL    Comment:        IF HEPARIN RESULTS ARE BELOW EXPECTED VALUES, AND PATIENT DOSAGE HAS BEEN CONFIRMED, SUGGEST FOLLOW UP TESTING OF ANTITHROMBIN III LEVELS.   Basic metabolic panel     Status: Abnormal   Collection Time: 04/29/17 12:43 AM  Result Value Ref Range   Sodium 136 135 - 145 mmol/L   Potassium 4.7 3.5 - 5.1 mmol/L   Chloride 104 101 - 111 mmol/L   CO2 24 22 - 32 mmol/L   Glucose, Bld 114 (H) 65 - 99 mg/dL   BUN 49 (H) 6 - 20 mg/dL   Creatinine, Ser 3.41 (H) 0.61 - 1.24 mg/dL   Calcium 8.3 (L) 8.9 - 10.3 mg/dL   GFR calc non Af Amer 16 (L) >60 mL/min   GFR calc Af Amer 19 (L) >60 mL/min    Comment: (NOTE) The eGFR has been calculated using the CKD EPI equation. This calculation has not been validated in all clinical situations. eGFR's persistently <60 mL/min signify  possible Chronic Kidney Disease.    Anion gap 8 5 - 15  Magnesium     Status: None   Collection Time: 04/29/17 12:43 AM  Result Value Ref Range   Magnesium 1.9 1.7 - 2.4 mg/dL  Phosphorus     Status: None   Collection Time: 04/29/17 12:43 AM  Result Value Ref Range   Phosphorus 4.6 2.5 - 4.6 mg/dL  CBC     Status: Abnormal   Collection Time: 04/29/17 12:43 AM  Result Value Ref Range   WBC 16.9 (H) 4.0 - 10.5 K/uL   RBC 4.63 4.22 - 5.81 MIL/uL   Hemoglobin 13.5 13.0 - 17.0 g/dL   HCT 42.2 39.0 - 52.0 %   MCV 91.1 78.0 - 100.0 fL   MCH 29.2 26.0 - 34.0 pg   MCHC 32.0 30.0 - 36.0 g/dL   RDW 16.9 (H) 11.5 - 15.5 %   Platelets 133 (L) 150 - 400 K/uL    ECG   None today  Telemetry   A-fib with CVR - Personally Reviewed  Radiology  Mr Virgel Paling UV Contrast  Result Date: 04/27/2017 CLINICAL DATA:  Acute onset of altered mental status and aphasia. EXAM: MRI HEAD WITHOUT CONTRAST MRA HEAD WITHOUT CONTRAST TECHNIQUE: Multiplanar, multiecho pulse sequences of the brain and surrounding structures were obtained without intravenous contrast. Angiographic images of the head were obtained using MRA technique without contrast. COMPARISON:  CT head without contrast from the same day. FINDINGS: MRI HEAD FINDINGS Brain: The diffusion-weighted images demonstrate no acute or subacute infarction. The study is moderately degraded by patient motion. This limits detection of small lesions. Mild to moderate periventricular white matter changes are noted bilaterally. The brainstem and cerebellum are normal. Vascular: Flow is present in the major intracranial arteries release the level of the MCA bifurcations and basilar tip. Skull and upper cervical spine: The skullbase is within normal limits. The craniocervical junction is unremarkable. Sinuses/Orbits: A remote left orbital floor fracture is noted. The paranasal sinuses are otherwise clear. A prosthetic right globe is noted. MRA HEAD FINDINGS  Time-of-flight images are significantly degraded by patient motion. Flow is present in the internal carotid artery is new the MCA bifurcations bilaterally. Flow is present in both vertebral arteries the basilar tip. Branch vessel signal is obscured by patient motion. IMPRESSION: 1. No acute infarct. 2. Mild-to-moderate white matter disease likely reflects the sequela of chronic microvascular ischemia. 3. The study is moderately degraded by patient motion. 4. No significant proximal stenosis or occlusion at the circle of Willis. Branch vessel evaluation cannot be performed secondary to significant patient motion on the time-of-flight images. These results were discussed in person at the time of interpretation on 04/27/2017 at 8:35 Pm to Dr. Roland Rack , who verbally acknowledged these results. Electronically Signed   By: San Morelle M.D.   On: 04/27/2017 21:09   Mr Brain Wo Contrast  Result Date: 04/27/2017 CLINICAL DATA:  Acute onset of altered mental status and aphasia. EXAM: MRI HEAD WITHOUT CONTRAST MRA HEAD WITHOUT CONTRAST TECHNIQUE: Multiplanar, multiecho pulse sequences of the brain and surrounding structures were obtained without intravenous contrast. Angiographic images of the head were obtained using MRA technique without contrast. COMPARISON:  CT head without contrast from the same day. FINDINGS: MRI HEAD FINDINGS Brain: The diffusion-weighted images demonstrate no acute or subacute infarction. The study is moderately degraded by patient motion. This limits detection of small lesions. Mild to moderate periventricular white matter changes are noted bilaterally. The brainstem and cerebellum are normal. Vascular: Flow is present in the major intracranial arteries release the level of the MCA bifurcations and basilar tip. Skull and upper cervical spine: The skullbase is within normal limits. The craniocervical junction is unremarkable. Sinuses/Orbits: A remote left orbital floor fracture is  noted. The paranasal sinuses are otherwise clear. A prosthetic right globe is noted. MRA HEAD FINDINGS Time-of-flight images are significantly degraded by patient motion. Flow is present in the internal carotid artery is new the MCA bifurcations bilaterally. Flow is present in both vertebral arteries the basilar tip. Branch vessel signal is obscured by patient motion. IMPRESSION: 1. No acute infarct. 2. Mild-to-moderate white matter disease likely reflects the sequela of chronic microvascular ischemia. 3. The study is moderately degraded by patient motion. 4. No significant proximal stenosis or occlusion at the circle of Willis. Branch vessel evaluation cannot be performed secondary to significant patient motion on the time-of-flight images. These results were discussed in person at the time of interpretation on 04/27/2017 at 8:35 Pm to Dr. Roland Rack , who verbally acknowledged these results. Electronically Signed  By: San Morelle M.D.   On: 04/27/2017 21:09   Dg Chest Port 1 View  Result Date: 04/29/2017 CLINICAL DATA:  76 year old male with confusion. Acute renal failure. Hypertension. Subsequent encounter. EXAM: PORTABLE CHEST 1 VIEW COMPARISON:  04/28/2017 chest x-ray.  04/27/2017 chest CT. FINDINGS: Cardiomegaly Pulmonary vascular congestion greatest centrally. Bibasilar subsegmental atelectasis. Prominent mediastinum unchanged and evaluated on recent CT. IMPRESSION: Cardiomegaly. Pulmonary vascular congestion similar to prior exam. Bibasilar subsegmental atelectasis. Electronically Signed   By: Genia Del M.D.   On: 04/29/2017 07:15   Dg Chest Port 1 View  Result Date: 04/28/2017 CLINICAL DATA:  Renal failure EXAM: PORTABLE CHEST 1 VIEW COMPARISON:  Yesterday FINDINGS: Mediastinum remains prominent. Heart remains enlarged. Low volumes and bibasilar atelectasis. No pneumothorax. No pleural effusion. IMPRESSION: Cardiomegaly and bibasilar atelectasis. Electronically Signed   By:  Marybelle Killings M.D.   On: 04/28/2017 07:23   Ct Head Code Stroke Wo Contrast  Result Date: 04/27/2017 CLINICAL DATA:  Code stroke.  Intermittent aphasia. EXAM: CT HEAD WITHOUT CONTRAST TECHNIQUE: Contiguous axial images were obtained from the base of the skull through the vertex without intravenous contrast. COMPARISON:  CT head from the same [facility] at [facility]. CT head without contrast 06/04/2012 at [place]: Brain: No acute infarct, hemorrhage, or mass lesion is present. Mild atrophy and white matter changes are stable. No focal cortical defect is evident. The basal ganglia and insular ribbon are intact. Vascular: Vessels are somewhat hyperdense diffusely without significant asymmetry. Atherosclerotic calcifications are present in the cavernous internal carotid artery's. Skull: Calvarium is intact. No focal lytic or blastic lesions are present. Sinuses/Orbits: Paranasal sinuses are clear. A remote left orbital floor fracture is again noted. There is no evidence for entrapment of the inferior rectus muscle. The globes and orbits are otherwise within normal limits. ASPECTS [facility] Stroke Program Early CT Score) - Ganglionic level infarction (caudate, lentiform nuclei, internal capsule, insula, M1-M3 cortex): 7/7 - Supraganglionic infarction (M4-M6 cortex): 3/3 Total score (0-10 with 10 being normal): 10/10 IMPRESSION: 1. No acute intracranial abnormality or significant interval change. 2. Stable atrophy and white matter disease. 3. ASPECTS is 10/10 These results were pager texted at the time of interpretation on 04/27/2017 at 7:14 pm to Dr. Leonel Ramsay. Electronically Signed   By: San Morelle M.D.   On: 04/27/2017 19:14    Cardiac Studies   LV EF: 50% -   55%  ------------------------------------------------------------------- Indications:      Atrial fibrillation - 427.31.  ------------------------------------------------------------------- History:   Risk factors:   Chronic kidney disease. Cirrhosis.  ------------------------------------------------------------------- Study Conclusions  - Left ventricle: Endocardium not well seen. The cavity size was   normal. Wall thickness was increased in a pattern of severe LVH.   Systolic function was normal. The estimated ejection fraction was   in the range of 50% to 55%. The study is not technically   sufficient to allow evaluation of LV diastolic function. - Aortic valve: Calcified non coronary cusp. - Mitral valve: Calcfied anterior leaflet Calcified annulus. There   was mild regurgitation. - Left atrium: The atrium was moderately dilated. - Atrial septum: No defect or patent foramen ovale was identified. - Pulmonary arteries: PA peak pressure: 49 mm Hg (S).  Assessment   Active Problems:   ARF (acute renal failure) (HCC)   A-fib (HCC)   Acute diastolic CHF (congestive heart failure) (HCC)   Acute encephalopathy   Confusion   Non-ST elevation (NSTEMI) myocardial infarction [facility])   Plan  1. NSTEMI  - probably ACS, troponin >40. CT head negative for stroke. On IV heparin - given AKI, not a cath candidate at this time. Mild, burning chest pain - will treat as bp allows. On low dose aspirin. No b-blocker or ACE/ARB d/t hypotension. Start lipitor 80 mg QHS - check FLP in the am tomorrow. 2. AKI on CKD - may be related to hypotension, contrast bolus 3. Hypotension - unclear etiology, LVEF 50-55% on echo. On neosynephrine. 4. Cirrhosis with portal gastropathy - may not be a good long-term anticoagulation candidate due to this. 5. A-fib- may be chronic, rate-controlled at the moment. On IV heparin. CHADSVASC score of at least 3.  Time Spent Directly with Patient:  25 minutes  Length of Stay:  LOS: 2 days   Pixie Casino, MD, [place]  Attending Cardiologist  Direct Dial: [phone]  Fax: [phone]  Website:  [email or web address] 04/29/2017,  9:41 AM

## 2017-04-29 NOTE — Progress Notes (Signed)
PULMONARY / CRITICAL CARE MEDICINE   Name: Justin Parsons MRN: [id number] DOB: 25-Sep-1941    ADMISSION DATE:  04/27/2017  CHIEF COMPLAINT:  Altered mental status  SUMMARY:  76 y/o man with notable history of CKD who developed a pre-syncopal episode after working out his field today. 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 He had not been feeling well the last few days, and after he became acutely ill, he progressively worsened over a few hours with worsening encephalopathy and aphasia. His bradycardia and hypotension apparently resolved.  SUBJECTIVE:  Still agitated over night Cr rising    VITAL SIGNS: BP 108/67   Pulse (!) 109   Temp 98.8 F (37.1 C) (Axillary)   Resp (!) 34   Ht 5' 8"  (1.727 m)   Wt 279 lb 5.2 oz (126.7 kg)   SpO2 98%   BMI 42.47 kg/m  Room air  HEMODYNAMICS:    VENTILATOR SETTINGS:    INTAKE / OUTPUT:  Intake/Output Summary (Last 24 hours) at 04/29/17 0852 Last data filed at 04/29/17 0800  Gross per 24 hour  Intake          2034.41 ml  Output             3450 ml  Net         -1415.59 ml   PHYSICAL EXAMINATION: General appearance:  76 Year old  Male, well nourished, confused,  Conversant but nonsensical. Having hallucinations. Not able to follow train of thought  Eyes: anicteric sclerae icteric , moist conjunctivae; PERRL, EOMI bilaterally. Right eye is shut  Mouth:  membranes and no mucosal ulcerations; normal hard and soft palate Neck: Trachea midline; neck supple, no JVD Lungs/chest: CTA, with normal respiratory effort and no intercostal retractions CV: irreg irreg, no MRGs  Abdomen: Soft, non-tender; no masses or HSM Extremities: No peripheral edema or extremity lymphadenopathy Skin: Normal temperature, turgor and texture; no rash, ulcers or subcutaneous nodules Neuro/ Psych: awake, alert and oriented to person only. Confused & agitated at times.     LABS:  BMET  Recent Labs Lab 04/27/17 2129 04/28/17 0042 04/29/17 0043  NA 137 138 136  K 6.1* 5.5* 4.7  CL 107 110 104  CO2 20* 20* 24  BUN 51* 50* 49*  CREATININE 3.66* 3.36* 3.41*  GLUCOSE 108* 105* 114*    Electrolytes  Recent Labs Lab 04/27/17 2129 04/28/17 0042 04/29/17 0043  CALCIUM 8.4* 8.0* 8.3*  MG  --  1.6* 1.9  PHOS  --  5.1* 4.6    CBC  Recent Labs Lab 04/28/17 0042 04/29/17 0043  WBC 13.3* 16.9*  HGB 14.0 13.5  HCT 43.4 42.2  PLT 110* 133*    Coag's  Recent Labs Lab 04/28/17 0042  INR 1.25    Sepsis Markers No results for input(s): LATICACIDVEN, PROCALCITON, O2SATVEN in the last 168 hours.  ABG No results for input(s): PHART, PCO2ART, PO2ART in the last 168 hours.  Liver Enzymes No results for input(s): AST, ALT, ALKPHOS, BILITOT, ALBUMIN in the last 168 hours.  Cardiac Enzymes  Recent Labs Lab 04/28/17 1409 04/28/17 1930  TROPONINI 40.36* 35.27*    Glucose No results for input(s): GLUCAP in the last 168 hours.  Imaging Dg Chest Port 1 View  Result Date: 04/29/2017 CLINICAL DATA:  76 year old male with confusion. Acute renal failure. Hypertension. Subsequent encounter. EXAM: PORTABLE CHEST 1 VIEW COMPARISON:  04/28/2017 chest x-ray.  04/27/2017 chest  CT. FINDINGS: Cardiomegaly Pulmonary vascular congestion greatest centrally. Bibasilar subsegmental atelectasis. Prominent mediastinum unchanged and evaluated on recent CT. IMPRESSION: Cardiomegaly. Pulmonary vascular congestion similar to prior exam. Bibasilar subsegmental atelectasis. Electronically Signed   By: Genia Del M.D.   On: 04/29/2017 07:15    DISCUSSION: 76 y/o man with CKD presenting with AMS, AKI, and aphasia.   ASSESSMENT / PLAN:  Acute Encephalopathy -  reportedly drinks moonshine / aka "creek water" Suspect metabolic encephalopathy +/- withdrawal  Blindness in [facility] Neuro imaging negative. EEG was negative. Has several metabolic derangements.   Plan: precedex gtt Add thiamine and folate F/u serum volatiles  Keep in ICU  AKI on CKD  Elevated CK  Hyperkalemia  Hypomagnesemia  Scr rising.  Plan: Cont IVFs (LR at 125) Serial chemistries  Replace Mg   Chest Pain  NSTEMI Hx HTN Hypotension  Frequent PACs w/ transient AF -cards has seen. Recommend medical Rx at this time given high risk for cardiac cath.  -serum trop trending down but peaked at 40.36; denies chest pain Plan: Heparin gtt Daily asa  Change levo to Neo (given increasing ectopy) Can add low dose BB after off levophed  Correct electrolytes   Hx Hiatal Hernia  Cirrhosis of Liver  Portal Vein Thrombosis  Plan: Asp precautions PPI   Thrombocytopenia - suspect in setting of ETOH use  Plan: Trend cbc  Hypothyroidism -tsh mildly elevated  Plan: Increase synthroid to 62.5 from 50 (changed to IV dosing at 1/2 oral for now) Needs repeat TSH July 11  Leukocytosis -reactive Plan Trend CBC  FAMILY  - Updates: No family at bedside   - Inter-disciplinary family meet or Palliative Care meeting due by: 6/17  My cc time 35 minutes.   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  Patient was admitted for DTs and started on precedex.  Work up so far has been inconclusive.  Remains hypotensive, changed from norepi (developed a-fib) to neo with much better tolerance.  On exam, lungs are clear.  Patient is stable from a respiratory standpoint.  Will continue precedex.  Continue neo for now.  Monitor in the ICU.  No need for renal at this time given UOP.  Continue to monitor I/O.  No cardiac interventions at this time per Dr. Debara Pickett.    The patient is critically ill with multiple organ systems failure and requires high complexity decision making for assessment and  support, frequent evaluation and titration of therapies, application of advanced monitoring technologies and extensive interpretation of multiple databases.   Critical Care Time devoted to patient care services described in this note is  35  Minutes. This time reflects time of care of this signee Dr Jennet Maduro. This critical care time does not reflect procedure time, or teaching time or supervisory time of PA/NP/Med student/Med Resident etc but could involve care discussion time.  Rush Farmer, M.D. [facility] Pulmonary/Critical Care Medicine. Pager: [phone]. After hours pager: [phone].

## 2017-04-29 NOTE — Progress Notes (Signed)
Subjective: No complaints. Talking tangentially in room and with dysarthria/deep souther speech.   Exam: Vitals:   04/29/17 0758 04/29/17 0800  BP:  108/67  Pulse:  (!) 109  Resp:  (!) 34  Temp: 98.8 F (37.1 C)     HEENT-  Normocephalic, no lesions, without obvious abnormality.  Normal external eye and conjunctiva.  Normal TM's bilaterally.  Normal auditory canals and external ears. Normal external nose, mucus membranes and septum.  Normal pharynx.    Neuro:  Mental Status: Patient is awake, alert, mumbling speech, not understandable at times and very tangential and confabulating about airplanes/cops/tractors and boats. He will follow some commands such raising his arms and moving extremities.  Cranial Nerves: II: He blinks to threat with his left eye and Pupil on the left is reactive III,IV, VI: Enucleated on the right, on the left he has full extraocular movements V: Facial sensation is symmetric to temperature VII: Facial movement is symmetric.  VIII: hearing is intact to voice X: Uvula elevates symmetrically XI: Shoulder shrug is symmetric. XII: tongue is midline without atrophy or fasciculations.  Motor: He is able to keep both arms elevated above the bed without drift, he is able to keep his right leg suspended without drift, his left leg does fall back to bed prior to 5 seconds. Sensory: He endorses sensation bilaterally\ Cerebellar: He does not perform     Pertinent Labs/Diagnostics:  EEG: Impression: This is an abnormal EEG due to mild diffuse generalized slowing. No epileptiform abnormalities or seizures.  Clinical correlation: This EEG demonstrates evidence of mild cortical dysfunction but is nonspecific as to the etiology. Considerations would include metabolic derangements, medication effect, underlying neurodegenerative process, or global hypoperfusion/hypoxia. Clinical correlation is advised.  MRI brain: IMPRESSION: 1. No acute infarct. 2.  Mild-to-moderate white matter disease likely reflects the sequela of chronic microvascular ischemia. 3. The study is moderately degraded by patient motion. 4. No significant proximal stenosis or occlusion at the circle of Willis. Branch vessel evaluation cannot be performed secondary to significant patient motion on the time-of-flight images.   Etta Quill PA-C [facility] [phone]  Impression: 76 year old male with  encephalopathy. No clear time of onset nor is there clear evidence of infarct on MRI. 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 .    Recommendations: 1) Continue to evaluate metabolic issues. PCCM has initiated high dose thiamine for three days and then 100 mg daily.   At this time will S/O but feel free call us for any further additional information.     04/29/2017, 9:09 AM

## 2017-04-29 NOTE — Progress Notes (Signed)
ANTICOAGULATION CONSULT NOTE - Follow Up Consult  Pharmacy Consult for Heparin  Indication: chest pain/ACS  No Known Allergies  Patient Measurements: Height: 5' 8"  (172.7 cm) Weight: 279 lb 12.2 oz (126.9 kg) IBW/kg (Calculated) : 68.4  Vital Signs: Temp: 97.8 F (36.6 C) (06/11 0000) Temp Source: Axillary (06/11 0000) BP: 113/81 (06/11 0000) Pulse Rate: 79 (06/11 0000)  Labs:  Recent Labs  04/27/17 2129 04/28/17 0042 04/28/17 1409 04/28/17 1930 04/29/17 0043  HGB  --  14.0  --   --  13.5  HCT  --  43.4  --   --  42.2  PLT  --  110*  --   --  133*  LABPROT  --  15.7*  --   --   --   INR  --  1.25  --   --   --   HEPARINUNFRC  --   --   --   --  0.46  CREATININE 3.66* 3.36*  --   --  3.41*  CKTOTAL  --  935*  --   --   --   TROPONINI  --   --  40.36* 35.27*  --     Estimated Creatinine Clearance: 23.6 mL/min (A) (by C-G formula based on SCr of 3.41 mg/dL (H)).   Assessment: 28 yom admitted with possible stroke (unlikely but still in differential per Neuro), now with markedly elevated troponin concerning for ACS. Pharmacy consulted to start heparin. Hx of afib deemed not a candidate for anticoagulation due to portal gastropathy, cirrhosis, and ongoing ETOH use. Hg wnl, plt 133. SCr 3.41, CrCl<30. Initial heparin level is therapeutic.   Goal of Therapy:  Heparin level 0.3-0.5 units/ml Monitor platelets by anticoagulation protocol: Yes   Plan:  -Cont heparin at 1250 units/hr -1000 HL  Viviana Trimble 04/29/2017,1:37 AM

## 2017-04-29 NOTE — Progress Notes (Signed)
ANTICOAGULATION CONSULT NOTE - Follow Up Consult  Pharmacy Consult for Heparin  Indication: chest pain/ACS  No Known Allergies  Patient Measurements: Height: 5' 8"  (172.7 cm) Weight: 279 lb 5.2 oz (126.7 kg) IBW/kg (Calculated) : 68.4  Vital Signs: Temp: 98.8 F (37.1 C) (06/11 0758) Temp Source: Axillary (06/11 0758) BP: 92/62 (06/11 1100) Pulse Rate: 83 (06/11 1100)  Labs:  Recent Labs  04/27/17 2129 04/28/17 0042 04/28/17 1409 04/28/17 1930 04/29/17 0043 04/29/17 1038  HGB  --  14.0  --   --  13.5  --   HCT  --  43.4  --   --  42.2  --   PLT  --  110*  --   --  133*  --   LABPROT  --  15.7*  --   --   --   --   INR  --  1.25  --   --   --   --   HEPARINUNFRC  --   --   --   --  0.46 0.40  CREATININE 3.66* 3.36*  --   --  3.41*  --   CKTOTAL  --  935*  --   --   --   --   TROPONINI  --   --  40.36* 35.27*  --   --     Estimated Creatinine Clearance: 23.5 mL/min (A) (by C-G formula based on SCr of 3.41 mg/dL (H)).   Assessment: 40 yom admitted with possible stroke (unlikely but still in differential per Neuro), now with markedly elevated troponin concerning for ACS. Pharmacy consulted to start heparin. Hx of afib deemed not a candidate for anticoagulation due to portal gastropathy, cirrhosis, and ongoing ETOH use. Heparin level remains therapeutic. No bleeding noted  Goal of Therapy:  Heparin level 0.3-0.5 units/ml Monitor platelets by anticoagulation protocol: Yes   Plan:  Cont heparin at 1250 units/hr Daily heparin level and CBC  Anaia Frith, Rande Lawman 04/29/2017,11:43 AM

## 2017-04-30 ENCOUNTER — Inpatient Hospital Stay (HOSPITAL_COMMUNITY): Payer: Medicare Other

## 2017-04-30 LAB — LIPID PANEL
Cholesterol: 123 mg/dL (ref 0–200)
HDL: 35 mg/dL — ABNORMAL LOW (ref >40)
LDL Cholesterol: 71 mg/dL (ref 0–99)
Total CHOL/HDL Ratio: 3.5 RATIO
Triglycerides: 85 mg/dL (ref <150)
VLDL: 17 mg/dL (ref 0–40)

## 2017-04-30 LAB — BASIC METABOLIC PANEL
Anion gap: 7 (ref 5–15)
BUN: 40 mg/dL — AB (ref 6–20)
CO2: 22 mmol/L (ref 22–32)
CREATININE: 2.42 mg/dL — AB (ref 0.61–1.24)
Calcium: 8.3 mg/dL — ABNORMAL LOW (ref 8.9–10.3)
Chloride: 108 mmol/L (ref 101–111)
GFR calc Af Amer: 28 mL/min — ABNORMAL LOW (ref >60)
GFR, EST NON AFRICAN AMERICAN: 24 mL/min — AB (ref >60)
GLUCOSE: 82 mg/dL (ref 65–99)
Potassium: 4.5 mmol/L (ref 3.5–5.1)
Sodium: 137 mmol/L (ref 135–145)

## 2017-04-30 LAB — CBC
HCT: 38.8 % — ABNORMAL LOW (ref 39.0–52.0)
Hemoglobin: 12.3 g/dL — ABNORMAL LOW (ref 13.0–17.0)
MCH: 29.1 pg (ref 26.0–34.0)
MCHC: 31.7 g/dL (ref 30.0–36.0)
MCV: 91.7 fL (ref 78.0–100.0)
PLATELETS: 93 10*3/uL — AB (ref 150–400)
RBC: 4.23 MIL/uL (ref 4.22–5.81)
RDW: 17 % — AB (ref 11.5–15.5)
WBC: 10 10*3/uL (ref 4.0–10.5)

## 2017-04-30 LAB — CBC WITH DIFFERENTIAL/PLATELET
Basophils Absolute: 0 10*3/uL (ref 0.0–0.1)
Basophils Relative: 0 %
EOS PCT: 2 %
Eosinophils Absolute: 0.2 10*3/uL (ref 0.0–0.7)
HEMATOCRIT: 39.4 % (ref 39.0–52.0)
HEMOGLOBIN: 12.3 g/dL — AB (ref 13.0–17.0)
LYMPHS PCT: 12 %
Lymphs Abs: 1.2 10*3/uL (ref 0.7–4.0)
MCH: 28.9 pg (ref 26.0–34.0)
MCHC: 31.2 g/dL (ref 30.0–36.0)
MCV: 92.7 fL (ref 78.0–100.0)
MONO ABS: 0.8 10*3/uL (ref 0.1–1.0)
MONOS PCT: 8 %
NEUTROS PCT: 78 %
Neutro Abs: 8.1 10*3/uL — ABNORMAL HIGH (ref 1.7–7.7)
PLATELETS: 115 10*3/uL — AB (ref 150–400)
RBC: 4.25 MIL/uL (ref 4.22–5.81)
RDW: 17.1 % — ABNORMAL HIGH (ref 11.5–15.5)
WBC: 10.3 10*3/uL (ref 4.0–10.5)

## 2017-04-30 LAB — HEPARIN LEVEL (UNFRACTIONATED): Heparin Unfractionated: 0.35 IU/mL (ref 0.30–0.70)

## 2017-04-30 LAB — MYOGLOBIN, URINE: MYOGLOBIN UR: 10 ng/mL (ref 0–13)

## 2017-04-30 MED ORDER — TRAMADOL HCL 50 MG PO TABS
50.0000 mg | ORAL_TABLET | Freq: Two times a day (BID) | ORAL | Status: DC | PRN
  Administered 2017-04-30 – 2017-05-05 (×9): 50 mg via ORAL
  Filled 2017-04-30 (×10): qty 1

## 2017-04-30 MED ORDER — SODIUM CHLORIDE 0.9 % IV SOLN
INTRAVENOUS | Status: DC
  Administered 2017-04-30 – 2017-05-02 (×3): via INTRAVENOUS

## 2017-04-30 MED ORDER — LEVOTHYROXINE SODIUM 50 MCG PO TABS
50.0000 ug | ORAL_TABLET | Freq: Every day | ORAL | Status: DC
  Administered 2017-05-01 – 2017-05-06 (×6): 50 ug via ORAL
  Filled 2017-04-30 (×6): qty 1

## 2017-04-30 MED ORDER — TRAMADOL HCL 50 MG PO TABS: 50.0000 mg | ORAL_TABLET | Freq: Four times a day (QID) | ORAL | Status: DC | PRN

## 2017-04-30 MED ORDER — LORAZEPAM 1 MG PO TABS: 2.0000 mg | ORAL_TABLET | ORAL | Status: DC | PRN

## 2017-04-30 MED ORDER — FOLIC ACID 1 MG PO TABS
1.0000 mg | ORAL_TABLET | Freq: Every day | ORAL | Status: DC
  Administered 2017-04-30 – 2017-05-06 (×7): 1 mg via ORAL
  Filled 2017-04-30 (×7): qty 1

## 2017-04-30 MED ORDER — LORAZEPAM 2 MG/ML IJ SOLN: 2.0000 mg | INTRAMUSCULAR | Status: DC | PRN

## 2017-04-30 NOTE — Progress Notes (Addendum)
DAILY PROGRESS NOTE   Patient Name: Justin Parsons Date of Encounter: 04/30/2017  Hospital Problem List   Active Problems:   ARF (acute renal failure) (HCC)   A-fib (HCC)   Acute diastolic CHF (congestive heart failure) (HCC)   Acute encephalopathy   Confusion   Non-ST elevation (NSTEMI) myocardial infarction [facility])   Delirium tremens ([place])   Shock circulatory [facility])    Chief Complaint   Clearer today-  May be responding to treatment for encephalopathy  Subjective   BP improved - weaned off pressors and sedation. Troponin peaked at 40 and declined. No chest pain complaints - Creatinine is improving (2.42 today, down form 3.41 - 2 days ago) - baseline creatinine may be 2.1 (this was in 2014). He is now 3L up, but did have 80 mg IV lasix yesterday - appears euvolemic on exam today. May have been dehydrated.  Objective   Vitals:   04/30/17 0830 04/30/17 1000 04/30/17 1100 04/30/17 1136  BP: 99/69 100/71 109/78   Pulse: (!) 38 87 74 81  Resp: 16 13 (!) 37 (!) 23  Temp:    98.2 F (36.8 C)  TempSrc:    Axillary  SpO2: 100% 95% 100% 99%  Weight:      Height:        Intake/Output Summary (Last 24 hours) at 04/30/17 1143 Last data filed at 04/30/17 1100  Gross per 24 hour  Intake          5034.53 ml  Output             1655 ml  Net          3379.53 ml   Filed Weights   04/28/17 0149 04/29/17 0415 04/30/17 0315  Weight: 279 lb 12.2 oz (126.9 kg) 279 lb 5.2 oz (126.7 kg) 287 lb 4.2 oz (130.3 kg)    Physical Exam   General appearance: alert and cooperative Neck: no carotid bruit and no JVD Lungs: clear to auscultation bilaterally Heart: irregularly irregular rhythm Abdomen: soft, non-tender; bowel sounds normal; no masses,  no organomegaly Extremities: extremities normal, atraumatic, no cyanosis or edema Pulses: 2+ and symmetric Skin: Skin color, texture, turgor normal. No rashes or lesions Neurologic: Mental status: Alert, oriented, thought content  appropriate Psych: Pleasant, clearer today  Inpatient Medications    Scheduled Meds: . aspirin EC  81 mg Oral Daily  . atorvastatin  80 mg Oral q1800  . folic acid  1 mg Oral Daily  . [START ON 05/01/2017] levothyroxine  50 mcg Oral QAC breakfast  . pantoprazole  40 mg Oral Daily  . thiamine injection  100 mg Intravenous Q24H    Continuous Infusions: . heparin 1,250 Units/hr (04/30/17 1100)  . lactated ringers 125 mL/hr at 04/30/17 1100    PRN Meds: gi cocktail, levalbuterol, LORazepam, ondansetron, RESOURCE THICKENUP CLEAR, traMADol   Labs   Results for orders placed or performed during the hospital encounter of 04/27/17 (from the past 48 hour(s))  Troponin I     Status: Abnormal   Collection Time: 04/28/17  2:09 PM  Result Value Ref Range   Troponin I 40.36 (HH) <0.03 ng/mL    Comment: CRITICAL RESULT CALLED TO, READ BACK BY AND VERIFIED WITH: Debby Bud [id number] [place]   Volatiles,Blood (acetone,ethanol,isoprop,methanol)     Status: None   Collection Time: 04/28/17  2:09 PM  Result Value Ref Range   Acetone, blood Negative 0.000 - 0.010 %    Comment:  Detection Limit = 0.010   Ethanol, blood Negative 0.000 - 0.010 %    Comment: (NOTE)                                Detection Limit = 0.010 This test was developed and its performance characteristics determined by [facility]. It has not been cleared or approved by the Food and Drug Administration.    Isopropanol, blood Negative 0.000 - 0.010 %    Comment:                                 Detection Limit = 0.010   Methanol, blood Negative 0.000 - 0.010 %    Comment: (NOTE)                                Detection Limit = 0.010 Performed At: [facility] [address] [place], [place] [id number] Lindon Romp MD YK:[id number]   Troponin I     Status: Abnormal   Collection Time: 04/28/17  7:30 PM  Result Value Ref Range   Troponin I 35.27 (HH) <0.03 ng/mL    Comment:  CRITICAL VALUE NOTED.  VALUE IS CONSISTENT WITH PREVIOUSLY REPORTED AND CALLED VALUE.  Heparin level (unfractionated)     Status: None   Collection Time: 04/29/17 12:43 AM  Result Value Ref Range   Heparin Unfractionated 0.46 0.30 - 0.70 IU/mL    Comment:        IF HEPARIN RESULTS ARE BELOW EXPECTED VALUES, AND PATIENT DOSAGE HAS BEEN CONFIRMED, SUGGEST FOLLOW UP TESTING OF ANTITHROMBIN III LEVELS.   Basic metabolic panel     Status: Abnormal   Collection Time: 04/29/17 12:43 AM  Result Value Ref Range   Sodium 136 135 - 145 mmol/L   Potassium 4.7 3.5 - 5.1 mmol/L   Chloride 104 101 - 111 mmol/L   CO2 24 22 - 32 mmol/L   Glucose, Bld 114 (H) 65 - 99 mg/dL   BUN 49 (H) 6 - 20 mg/dL   Creatinine, Ser 3.41 (H) 0.61 - 1.24 mg/dL   Calcium 8.3 (L) 8.9 - 10.3 mg/dL   GFR calc non Af Amer 16 (L) >60 mL/min   GFR calc Af Amer 19 (L) >60 mL/min    Comment: (NOTE) The eGFR has been calculated using the CKD EPI equation. This calculation has not been validated in all clinical situations. eGFR's persistently <60 mL/min signify possible Chronic Kidney Disease.    Anion gap 8 5 - 15  Magnesium     Status: None   Collection Time: 04/29/17 12:43 AM  Result Value Ref Range   Magnesium 1.9 1.7 - 2.4 mg/dL  Phosphorus     Status: None   Collection Time: 04/29/17 12:43 AM  Result Value Ref Range   Phosphorus 4.6 2.5 - 4.6 mg/dL  CBC     Status: Abnormal   Collection Time: 04/29/17 12:43 AM  Result Value Ref Range   WBC 16.9 (H) 4.0 - 10.5 K/uL   RBC 4.63 4.22 - 5.81 MIL/uL   Hemoglobin 13.5 13.0 - 17.0 g/dL   HCT 42.2 39.0 - 52.0 %   MCV 91.1 78.0 - 100.0 fL   MCH 29.2 26.0 - 34.0 pg   MCHC 32.0 30.0 - 36.0 g/dL   RDW 16.9 (H) 11.5 -  15.5 %   Platelets 133 (L) 150 - 400 K/uL  Heparin level (unfractionated)     Status: None   Collection Time: 04/29/17 10:38 AM  Result Value Ref Range   Heparin Unfractionated 0.40 0.30 - 0.70 IU/mL    Comment:        IF HEPARIN RESULTS ARE  BELOW EXPECTED VALUES, AND PATIENT DOSAGE HAS BEEN CONFIRMED, SUGGEST FOLLOW UP TESTING OF ANTITHROMBIN III LEVELS.   CK total and CKMB (cardiac)not at [facility]     Status: Abnormal   Collection Time: 04/29/17 10:38 AM  Result Value Ref Range   Total CK 299 49 - 397 U/L   CK, MB 12.8 (H) 0.5 - 5.0 ng/mL   Relative Index 4.3 (H) 0.0 - 2.5  Basic metabolic panel     Status: Abnormal   Collection Time: 04/29/17 10:38 AM  Result Value Ref Range   Sodium 138 135 - 145 mmol/L   Potassium 5.0 3.5 - 5.1 mmol/L    Comment: HEMOLYSIS AT THIS LEVEL MAY AFFECT RESULT   Chloride 107 101 - 111 mmol/L   CO2 19 (L) 22 - 32 mmol/L   Glucose, Bld 88 65 - 99 mg/dL   BUN 47 (H) 6 - 20 mg/dL   Creatinine, Ser 3.25 (H) 0.61 - 1.24 mg/dL   Calcium 8.5 (L) 8.9 - 10.3 mg/dL   GFR calc non Af Amer 17 (L) >60 mL/min   GFR calc Af Amer 20 (L) >60 mL/min    Comment: (NOTE) The eGFR has been calculated using the CKD EPI equation. This calculation has not been validated in all clinical situations. eGFR's persistently <60 mL/min signify possible Chronic Kidney Disease.    Anion gap 12 5 - 15  Heparin level (unfractionated)     Status: None   Collection Time: 04/30/17  1:49 AM  Result Value Ref Range   Heparin Unfractionated 0.35 0.30 - 0.70 IU/mL    Comment:        IF HEPARIN RESULTS ARE BELOW EXPECTED VALUES, AND PATIENT DOSAGE HAS BEEN CONFIRMED, SUGGEST FOLLOW UP TESTING OF ANTITHROMBIN III LEVELS.   CBC     Status: Abnormal   Collection Time: 04/30/17  1:49 AM  Result Value Ref Range   WBC 10.0 4.0 - 10.5 K/uL   RBC 4.23 4.22 - 5.81 MIL/uL   Hemoglobin 12.3 (L) 13.0 - 17.0 g/dL   HCT 38.8 (L) 39.0 - 52.0 %   MCV 91.7 78.0 - 100.0 fL   MCH 29.1 26.0 - 34.0 pg   MCHC 31.7 30.0 - 36.0 g/dL   RDW 17.0 (H) 11.5 - 15.5 %   Platelets 93 (L) 150 - 400 K/uL    Comment: CONSISTENT WITH PREVIOUS RESULT  Basic metabolic panel     Status: Abnormal   Collection Time: 04/30/17  1:49 AM  Result Value Ref  Range   Sodium 137 135 - 145 mmol/L   Potassium 4.5 3.5 - 5.1 mmol/L   Chloride 108 101 - 111 mmol/L   CO2 22 22 - 32 mmol/L   Glucose, Bld 82 65 - 99 mg/dL   BUN 40 (H) 6 - 20 mg/dL   Creatinine, Ser 2.42 (H) 0.61 - 1.24 mg/dL   Calcium 8.3 (L) 8.9 - 10.3 mg/dL   GFR calc non Af Amer 24 (L) >60 mL/min   GFR calc Af Amer 28 (L) >60 mL/min    Comment: (NOTE) The eGFR has been calculated using the CKD EPI equation. This calculation has not  been validated in all clinical situations. eGFR's persistently <60 mL/min signify possible Chronic Kidney Disease.    Anion gap 7 5 - 15  Lipid panel     Status: Abnormal   Collection Time: 04/30/17  1:49 AM  Result Value Ref Range   Cholesterol 123 0 - 200 mg/dL   Triglycerides 85 <150 mg/dL   HDL 35 (L) >40 mg/dL   Total CHOL/HDL Ratio 3.5 RATIO   VLDL 17 0 - 40 mg/dL   LDL Cholesterol 71 0 - 99 mg/dL    Comment:        Total Cholesterol/HDL:CHD Risk Coronary Heart Disease Risk Table                     Men   Women  1/2 Average Risk   3.4   3.3  Average Risk       5.0   4.4  2 X Average Risk   9.6   7.1  3 X Average Risk  23.4   11.0        Use the calculated Patient Ratio above and the CHD Risk Table to determine the patient's CHD Risk.        ATP III CLASSIFICATION (LDL):  <100     mg/dL   Optimal  100-129  mg/dL   Near or Above                    Optimal  130-159  mg/dL   Borderline  160-189  mg/dL   High  >190     mg/dL   Very High     ECG   None today  Telemetry   A-fib with CVR - Personally Reviewed  Radiology    Dg Shoulder 1v Right  Result Date: 04/30/2017 CLINICAL DATA:  right shoulder tenderness for several days. EXAM: RIGHT SHOULDER - 1 VIEW COMPARISON:  None. FINDINGS: Moderate degenerative changes are noted involving the acromioclavicular and glenohumeral joint. There is no fracture or subluxation identified. No radiopaque foreign bodies are soft tissue calcifications. IMPRESSION: 1. Acromioclavicular and  glenohumeral joint osteoarthritis. Electronically Signed   By: Kerby Moors M.D.   On: 04/30/2017 10:05   Dg Chest Port 1 View  Result Date: 04/29/2017 CLINICAL DATA:  76 year old male with confusion. Acute renal failure. Hypertension. Subsequent encounter. EXAM: PORTABLE CHEST 1 VIEW COMPARISON:  04/28/2017 chest x-ray.  04/27/2017 chest CT. FINDINGS: Cardiomegaly Pulmonary vascular congestion greatest centrally. Bibasilar subsegmental atelectasis. Prominent mediastinum unchanged and evaluated on recent CT. IMPRESSION: Cardiomegaly. Pulmonary vascular congestion similar to prior exam. Bibasilar subsegmental atelectasis. Electronically Signed   By: Genia Del M.D.   On: 04/29/2017 07:15    Cardiac Studies   LV EF: 50% -   55%  ------------------------------------------------------------------- Indications:      Atrial fibrillation - 427.31.  ------------------------------------------------------------------- History:   Risk factors:  Chronic kidney disease. Cirrhosis.  ------------------------------------------------------------------- Study Conclusions  - Left ventricle: Endocardium not well seen. The cavity size was   normal. Wall thickness was increased in a pattern of severe LVH.   Systolic function was normal. The estimated ejection fraction was   in the range of 50% to 55%. The study is not technically   sufficient to allow evaluation of LV diastolic function. - Aortic valve: Calcified non coronary cusp. - Mitral valve: Calcfied anterior leaflet Calcified annulus. There   was mild regurgitation. - Left atrium: The atrium was moderately dilated. - Atrial septum: No defect or patent foramen ovale was identified. -  Pulmonary arteries: PA peak pressure: 49 mm Hg (S).  Assessment   Active Problems:   ARF (acute renal failure) (HCC)   A-fib (HCC)   Acute diastolic CHF (congestive heart failure) (HCC)   Acute encephalopathy   Confusion   Non-ST elevation (NSTEMI)  myocardial infarction [facility])   Delirium tremens (HCC)   Shock circulatory ([place])   Plan   1. NSTEMI  - probably ACS, troponin >40. CT head negative for stroke. On IV heparin - creatinine improving with hydration overnight - may be close to baseline. Would recommend cardiac cath - probably tomorrow. 2. Hypotension - was likely related to precedex. Resolved and no longer on sedation or pressors.  3. Cirrhosis with portal gastropathy - may not be a good long-term anticoagulation candidate due to this. 4. A-fib- may be chronic, rate-controlled at the moment. On IV heparin. CHADSVASC score of at least 3. Consider long-term anticoagulation when we determine cause of NSTEMI. 5. AKI on CKD - baseline creatinine in 2014 was 2.1 ( now 2.4, improved from up to 3.6) - received lasix, but net positive. LVEF low normal at 50-55%. Creatinine will likely support LHC tomorrow. 6. 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 He is awake and oriented x 3 currently. 7. Thrombocytopenia - agree this may be related to platelets - continue heparin, plan for re-check platelets later today at 4 pm. If they stabilize, would still support cath. No S/S of bleeding.  I discussed the risks, benefits and alternatives to cardiac catheterization and he is willing to proceed. Will try to arrange for tomorrow. Keep NPO p MN.  Time Spent Directly with Patient:  25 minutes  Length of Stay:  LOS: 3 days   Pixie Casino, MD, [place]  Attending Cardiologist  Direct Dial: [phone]  Fax: [phone]  Website:  [email or web address] [email or web address] 04/30/2017, 11:43 AM

## 2017-04-30 NOTE — Progress Notes (Signed)
  Speech Language Pathology Treatment: Dysphagia  Patient Details Name: Justin Parsons MRN: [id number] DOB: 1940/12/24 Today's Date: 04/30/2017 Time: 1950-9326 SLP Time Calculation (min) (ACUTE ONLY): 17 min  Assessment / Plan / Recommendation Clinical Impression  Pt's mentation appears improved today compared to initial SLP evaluation. He consumed a variety of solids with thin liquids, showing no overt s/s of aspiration. Recommend diet advancement to regular textures and thin liquids. Would use general aspiration and esophageal precautions. Will f/u briefly for tolerance with no additional f/u for dysphagia  post-d/c anticipated.   HPI HPI: 76 y.o.malewith history of Afib- probably chronic, cirrhosis with portal gastropathy, CKD transferred from [facility] with acute mental status changes, global aphasia, hypotension, and initially bradycardia. Per Neuro evaluation including CT and MRI this is less likely to be acute CVA. ? "shock brain" due to hypotension on arrival. Pt with history of esophageal dysphagia; EGD 02/07/15 revealed Schatzki's ring with proximal peptic stricture-status post [facility] dilation. Hiatal hernia. Portal gastropathy.      SLP Plan  Continue with current plan of care       Recommendations  Diet recommendations: Regular;Thin liquid Liquids provided via: Cup;Straw Medication Administration: Whole meds with liquid Supervision: Patient able to self feed;Intermittent supervision to cue for compensatory strategies Compensations: Slow rate;Small sips/bites;Minimize environmental distractions;Follow solids with liquid Postural Changes and/or Swallow Maneuvers: Seated upright 90 degrees;Upright 30-60 min after meal                Oral Care Recommendations: Oral care BID Follow up Recommendations: None SLP Visit Diagnosis: Dysphagia, unspecified (R13.10) Plan: Continue with current plan of care       GO                Germain Osgood 04/30/2017, 10:23 AM  Germain Osgood, M.A. CCC-SLP [phone]

## 2017-04-30 NOTE — Progress Notes (Signed)
ANTICOAGULATION CONSULT NOTE - Follow Up Consult  Pharmacy Consult for Heparin  Indication: chest pain/ACS  No Known Allergies  Patient Measurements: Height: 5' 8"  (172.7 cm) Weight: 287 lb 4.2 oz (130.3 kg) IBW/kg (Calculated) : 68.4  Vital Signs: Temp: 97.4 F (36.3 C) (06/12 0700) Temp Source: Oral (06/12 0700) BP: 106/66 (06/12 0600) Pulse Rate: 82 (06/12 0600)  Labs:  Recent Labs  04/28/17 0042 04/28/17 1409 04/28/17 1930 04/29/17 0043 04/29/17 1038 04/30/17 0149  HGB 14.0  --   --  13.5  --  12.3*  HCT 43.4  --   --  42.2  --  38.8*  PLT 110*  --   --  133*  --  93*  LABPROT 15.7*  --   --   --   --   --   INR 1.25  --   --   --   --   --   HEPARINUNFRC  --   --   --  0.46 0.40 0.35  CREATININE 3.36*  --   --  3.41* 3.25* 2.42*  CKTOTAL 935*  --   --   --  299  --   CKMB  --   --   --   --  12.8*  --   TROPONINI  --  40.36* 35.27*  --   --   --     Estimated Creatinine Clearance: 33.7 mL/min (A) (by C-G formula based on SCr of 2.42 mg/dL (H)).   Assessment: 39 yom admitted with possible stroke but neuro now saying pt without acute infarct. Heparin started for r/o ACS. Heparin level remains therapeutic at 0.35. No bleeding noted. Hx of afib deemed not a candidate for anticoagulation due to portal gastropathy, cirrhosis, and ongoing ETOH use. Platelets have decreased to 93.   Goal of Therapy:  Heparin level 0.3-0.7 units/ml Monitor platelets by anticoagulation protocol: Yes   Plan:  Cont heparin at 1250 units/hr Daily heparin level and CBC Watch platelets closely  Leib Elahi, Rande Lawman 04/30/2017,7:17 AM

## 2017-04-30 NOTE — Evaluation (Signed)
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 Found to have NSTEMI. Reportedly drinks moonshine aka "creek water," suspect metabolic encephalopathy +/- withdrawal. Plan for cardiac cath 6/13. Head MRI, CT-unremarkable. PMH includes HTN, CKD, alcoholism, cirrhosis of liver, TIA.blindness right eye.  Clinical Impression  Patient presents with generalized weakness, deconditioning, light headedness and impaired mobility s/p above. Pt lives alone and Mod I with SPC PTA. Pt's Sp02 dropped to mid 80s on RA. BP soft but stable and pt in A-fib during session.  Supine BP 94/75 Sitting BP 111/69 Sitting BP post transfer 115/73 Pt eager to return to PLOF. Mobility limited due to light headedness as this is pt's first time OOB. Would benefit from ST SNF to maximize independence and mobility prior to return home as pt lives alone with little support. Will follow acutely.    Follow Up Recommendations SNF;Supervision for mobility/OOB    Equipment Recommendations  None recommended by PT    Recommendations for Other Services       Precautions / Restrictions Precautions Precautions: Fall Precaution Comments: watch BP Restrictions Weight Bearing Restrictions: No      Mobility  Bed Mobility Overal bed mobility: Needs Assistance Bed Mobility: Supine to Sit     Supine to sit: Min assist;HOB elevated     General bed mobility comments: Assist to elevate trunk to get to EOB. Use of rail for support.  Transfers Overall transfer level: Needs assistance Equipment used: Rolling walker (2 wheeled) Transfers: Sit to/from Stand Sit to Stand: From elevated surface;Min guard         General transfer comment: Min guard to  stand from elevated bed height. Transferred to chair post ambulation.  Ambulation/Gait Ambulation/Gait assistance: Min guard Ambulation Distance (Feet): 4 Feet Assistive device: Rolling walker (2 wheeled) Gait Pattern/deviations: Step-to pattern;Step-through pattern;Decreased stride length;Trunk flexed Gait velocity: decreased Gait velocity interpretation: Below normal speed for age/gender General Gait Details: Slow, mildly unsteady gait with RW for support. Pt not a fan of using RW. + lightheadedness. BP soft but stable. Sp02 in mid 80s on RA. Donned 02.  Stairs            Wheelchair Mobility    Modified Rankin (Stroke Patients Only)       Balance Overall balance assessment: Needs assistance Sitting-balance support: Feet supported;No upper extremity supported Sitting balance-Leahy Scale: Good     Standing balance support: During functional activity;Bilateral upper extremity supported Standing balance-Leahy Scale: Poor Standing balance comment: Reilant on BUEs for support in standing.                             Pertinent Vitals/Pain Pain Assessment: No/denies pain    Home Living Family/patient expects to be discharged to:: Private residence Living Arrangements: Alone Available Help at Discharge: Friend(s);Available PRN/intermittently Type of Home: House Home Access: Ramped entrance     Home Layout: One level Home Equipment: Walker - 2 wheels;Cane - single point;Shower seat - built in      Prior Function Level of Independence: Independent with assistive device(s)         Comments: uses SPC for ambulation. 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 Walks ~100 yards everyda. Makes his own moonshine.     Hand  Dominance        Extremity/Trunk Assessment   Upper Extremity Assessment Upper Extremity Assessment: Defer to OT evaluation    Lower Extremity Assessment Lower Extremity Assessment: Generalized weakness (Grossly ~3/5 throughout)        Communication   Communication: No difficulties  Cognition Arousal/Alertness: Awake/alert Behavior During Therapy: WFL for tasks assessed/performed Overall Cognitive Status: Within Functional Limits for tasks assessed                                        General Comments      Exercises     Assessment/Plan    PT Assessment Patient needs continued PT services  PT Problem List Decreased strength;Decreased mobility;Decreased balance;Decreased activity tolerance;Cardiopulmonary status limiting activity;Obesity;Decreased knowledge of use of DME       PT Treatment Interventions Therapeutic activities;Gait training;Therapeutic exercise;Patient/family education;Balance training;Neuromuscular re-education;Stair training    PT Goals (Current goals can be found in the Care Plan section)  Acute Rehab PT Goals Patient Stated Goal: to get stronger PT Goal Formulation: With patient Time For Goal Achievement: 05/14/17 Potential to Achieve Goals: Fair    Frequency Min 4X/week   Barriers to discharge Decreased caregiver support lives alone    Co-evaluation               AM-PAC PT "6 Clicks" Daily Activity  Outcome Measure Difficulty turning over in bed (including adjusting bedclothes, sheets and blankets)?: Total Difficulty moving from lying on back to sitting on the side of the bed? : Total Difficulty sitting down on and standing up from a chair with arms (e.g., wheelchair, bedside commode, etc,.)?: Total Help needed moving to and from a bed to chair (including a wheelchair)?: A Little Help needed walking in hospital room?: A Little Help needed climbing 3-5 steps with a railing? : A Lot 6 Click Score: 11    End of Session Equipment Utilized During Treatment: Gait belt Activity Tolerance: Patient tolerated treatment well;Other (comment) (light headedness) Patient left: in chair;with call bell/phone within reach;with chair alarm set Nurse Communication:  Mobility status PT Visit Diagnosis: Unsteadiness on feet (R26.81);Dizziness and giddiness (R42);Muscle weakness (generalized) (M62.81)    Time: 1287-8676 PT Time Calculation (min) (ACUTE ONLY): 28 min   Charges:   PT Evaluation $PT Eval Moderate Complexity: 1 Procedure PT Treatments $Therapeutic Activity: 8-22 mins   PT G Codes:        Wray Kearns, PT, DPT [phone]    Justin Parsons 04/30/2017, 2:35 PM

## 2017-04-30 NOTE — Progress Notes (Addendum)
[facility] Attending Note  ADMISSION DATE:  2017-05-17  CHIEF COMPLAINT:  Altered mental status  Presenting HPI:  76 y.o. 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 He had not been feeling well the last few days, and after he became acutely ill, he progressively worsened over a few hours with worsening encephalopathy and aphasia. His bradycardia and hypotension apparently resolved.  Subjective:  Patient weaned off Precedex as of yesterday afternoon before 4:18 PM. Weaned off of vasopressor infusion before 11 PM yesterday. Nurse reports patient complaining of more pain overnight in his right shoulder. Patient reports pain in his right shoulder is chronic but progressively worsening. He denies any chest pain or pressure. Denies any dyspnea or cough.  Review of Systems:  No subjective fever or chills. No abdominal pain or nausea. No headache or vision changes.  Temp:  [97.6 F (36.4 C)-98.9 F (37.2 C)] 97.7 F (36.5 C) (06/12 0400) Pulse Rate:  [26-111] 68 (06/12 0400) Resp:  [18-44] 29 (06/12 0400) BP: (77-140)/(53-111) 96/71 (06/12 0400) SpO2:  [91 %-100 %] 96 % (06/12 0400) Weight:  [287 lb 4.2 oz (130.3 kg)] 287 lb 4.2 oz (130.3 kg) (06/12 0315)  General:  No family at bedside. Awake. No distress. Integument:  Warm & dry. No rash on exposed skin. HEENT:  Moist mucus memebranes. No scleral icterus.  Neurological:  Oriented 4. Moving all 4 extremities equally. No meningismus.. Musculoskeletal:  No deformity to right shoulder. Normal and symmetric muscle bulk.  Pulmonary:  Clear bilaterally to auscultation. Normal work of breathing on nasal cannula oxygen. Cardiovascular:  Regular rate & rhythm. No appreciable JVD. No edema. Telemetry:  Sinus rhythm. Abdomen:  Soft. Protuberant. Normoactive bowel  sounds.  LINES/TUBES: Foley May 18, 2023 >>> PIV  CBC Latest Ref Rng & Units 04/30/2017 04/29/2017 04/28/2017  WBC 4.0 - 10.5 K/uL 10.0 16.9(H) 13.3(H)  Hemoglobin 13.0 - 17.0 g/dL 12.3(L) 13.5 14.0  Hematocrit 39.0 - 52.0 % 38.8(L) 42.2 43.4  Platelets 150 - 400 K/uL 93(L) 133(L) 110(L)    BMP Latest Ref Rng & Units 04/30/2017 04/29/2017 04/29/2017  Glucose 65 - 99 mg/dL 82 88 114(H)  BUN 6 - 20 mg/dL 40(H) 47(H) 49(H)  Creatinine 0.61 - 1.24 mg/dL 2.42(H) 3.25(H) 3.41(H)  Sodium 135 - 145 mmol/L 137 138 136  Potassium 3.5 - 5.1 mmol/L 4.5 5.0 4.7  Chloride 101 - 111 mmol/L 108 107 104  CO2 22 - 32 mmol/L 22 19(L) 24  Calcium 8.9 - 10.3 mg/dL 8.3(L) 8.5(L) 8.3(L)    Hepatic Function Latest Ref Rng & Units 11/12/2013 06/07/2012 06/05/2012  Total Protein 6.0 - 8.3 g/dL 7.1 6.9 6.0  Albumin 3.5 - 5.2 g/dL 3.6 3.5 3.2(L)  AST 0 - 37 U/L _0 ALT 0 - 53 U/L _1 Alk Phosphatase 39 - 117 U/L 73 67 63  Total Bilirubin 0.3 - 1.2 mg/dL 0.6 0.5 0.4  Bilirubin, Direct 0.0 - 0.3 mg/dL - - -    IMAGING/STUDIES: CT HEAD W/O CODE STROKE 05-18-23: IMPRESSION: 1. No acute intracranial abnormality or significant interval change. 2. Stable atrophy and white matter disease. 3. ASPECTS is 10/10 MRI/MRA W/O 05/18/2023: IMPRESSION: 1. No acute infarct. 2. Mild-to-moderate white matter disease likely reflects the sequela of chronic microvascular ischemia. 3. The study is moderately degraded by patient  motion. 4. No significant proximal stenosis or occlusion at the circle of Willis. Branch vessel evaluation cannot be performed secondary to significant patient motion on the time-of-flight images. EEG 6/10:  This is an abnormal EEG due to mild diffuse generalized slowing. No epileptiform abnormalities or seizures. Serum Alcohol Panel 6/10:  Negative  TTE 6/10:  Endocardium not well seen. LV cavity size normal with severe LVH & EF 50-55%. Unable to assess diastolic function. LA moderately dilated & RA normal in  size. RV normal in size and function. Aortic sclerosis without stenosis. Aortic root normal in size. Mild mitral regurgitation. Mild pulmonic regurgitation. Mild tricuspid regurgitation.  PORT CXR 6/11:  Personally reviewed by me. No significant change compared with previous x-ray imaging. Lordotic view. Silhouetting of bilateral hemidiaphragms suggestive of atelectasis. No focal opacity or effusion appreciated.  MICROBIOLOGY: MRSA PCR 6/9:  Negative    ANTIBIOTICS: None.  SIGNIFICANT EVENTS: 06/09 - Admit w/ known moonshine use 06/11 - weaned from Precedex, Neo-Synephrine & Norepinephrine infusions.  ASSESSMENT/PLAN:  76 y.o. male with known use of alcohol that was homemade presenting with acute encephalopathy, acute on chronic renal failure, and aphasia. Patient's neurological status has recovered. His renal function is continually recovering as well. Appreciate assistance from neurology and cardiology.  1. Acute encephalopathy: Resolved. Suspect metabolic encephalopathy versus alcohol withdrawal. Neurology has signed off.  2. Acute on chronic renal failure stage undetermined: Improving. Continuing to trend urine output. Monitoring electrolytes and renal function daily. Discontinuing Foley.  3. Possible alcohol withdrawal: Has successfully weaned off Precedex infusion. Continuing PO thiamine and folic acid. Starting Stepdown CIWA protocol.  4. Non-STEMI: Appreciate input from cardiology. Continuing Lipitor, aspirin, and heparin drip. 5. Thrombocytopenia: Suspect secondary to underlying liver cirrhosis as well as some element of dilution. Trending cell counts daily with CBC. Repeat CBC at 4pm today.  6. Right shoulder pain:  Known history of arthritis. Starting Ultram 45m q12hr prn.  7. Essential hypertension: Holding antihypertensive regimen given patient's hypotension. Consider restarting beta blocker if patient's blood pressure remained stable. 8. Hypotension: Likely multifactorial from  hypovolemia. Weaned off vasopressor infusion. Monitor vitals per your protocol. 9. Transient atrial fibrillation: Continuing telemetry monitoring. Systemic anticoagulation for now. 10. Hypothyroidism: Switching back to PO Synthroid.  11. Hyperkalemia: Resolved. 12. Hypomagnesemia: Resolved.  Prophylaxis:  Heparin drip per pharmacy protocol & Protonix PO daily. Diet:  Clear Liquid Diet. Speech therapy consulted/following. Code Status: Full code per previous physician discussions. Disposition:  Transferring patient to stepdown unit. Consulting PT/OT. Family Update:  Patient updated during rounds today.   I have spent a total of 37 minutes of time today caring for the patient, reviewing the patient's electronic medical record, and with more than 50% of that time spent coordinating care with the patient as well as reviewing the continuing plan of care with the patient at bedside.  TRH to assume care & PCCM signing off as of 6/13.  JSonia BallerNAshok Cordia M.D. LJenn[facility] Hospital[facility] Pager:  [phone] 3pm or if no response, call [phone] 6:13 AM 04/30/17

## 2017-05-01 ENCOUNTER — Ambulatory Visit (HOSPITAL_COMMUNITY): Admit: 2017-05-01 | Payer: Medicare Other | Admitting: Cardiology

## 2017-05-01 ENCOUNTER — Encounter (HOSPITAL_COMMUNITY)
Admission: AD | Disposition: A | Discharge status: Skilled Nursing Facility | Payer: Self-pay | Source: Other Acute Inpatient Hospital | Attending: Internal Medicine

## 2017-05-01 DIAGNOSIS — I2510 Atherosclerotic heart disease of native coronary artery without angina pectoris: Secondary | ICD-10-CM

## 2017-05-01 HISTORY — PX: LEFT HEART CATH AND CORONARY ANGIOGRAPHY: CATH118249

## 2017-05-01 LAB — PHOSPHORUS: PHOSPHORUS: 3.3 mg/dL (ref 2.5–4.6)

## 2017-05-01 LAB — CBC
HEMATOCRIT: 39.3 % (ref 39.0–52.0)
Hemoglobin: 12.3 g/dL — ABNORMAL LOW (ref 13.0–17.0)
MCH: 29.1 pg (ref 26.0–34.0)
MCHC: 31.3 g/dL (ref 30.0–36.0)
MCV: 92.9 fL (ref 78.0–100.0)
PLATELETS: 99 10*3/uL — AB (ref 150–400)
RBC: 4.23 MIL/uL (ref 4.22–5.81)
RDW: 17.1 % — ABNORMAL HIGH (ref 11.5–15.5)
WBC: 9.3 10*3/uL (ref 4.0–10.5)

## 2017-05-01 LAB — COMPREHENSIVE METABOLIC PANEL
ALT: 20 U/L (ref 17–63)
AST: 34 U/L (ref 15–41)
Albumin: 3.1 g/dL — ABNORMAL LOW (ref 3.5–5.0)
Alkaline Phosphatase: 59 U/L (ref 38–126)
Anion gap: 8 (ref 5–15)
BILIRUBIN TOTAL: 1.1 mg/dL (ref 0.3–1.2)
BUN: 39 mg/dL — ABNORMAL HIGH (ref 6–20)
CALCIUM: 8.2 mg/dL — AB (ref 8.9–10.3)
CHLORIDE: 107 mmol/L (ref 101–111)
CO2: 22 mmol/L (ref 22–32)
Creatinine, Ser: 2.42 mg/dL — ABNORMAL HIGH (ref 0.61–1.24)
GFR, EST AFRICAN AMERICAN: 28 mL/min — AB (ref >60)
GFR, EST NON AFRICAN AMERICAN: 24 mL/min — AB (ref >60)
Glucose, Bld: 118 mg/dL — ABNORMAL HIGH (ref 65–99)
Potassium: 4.5 mmol/L (ref 3.5–5.1)
Sodium: 137 mmol/L (ref 135–145)
TOTAL PROTEIN: 5.9 g/dL — AB (ref 6.5–8.1)

## 2017-05-01 LAB — HEPARIN LEVEL (UNFRACTIONATED)
Heparin Unfractionated: 0.23 IU/mL — ABNORMAL LOW (ref 0.30–0.70)
Heparin Unfractionated: 0.51 IU/mL (ref 0.30–0.70)

## 2017-05-01 LAB — VITAMIN B1: Vitamin B1 (Thiamine): 117.2 nmol/L (ref 66.5–200.0)

## 2017-05-01 LAB — MAGNESIUM: MAGNESIUM: 1.9 mg/dL (ref 1.7–2.4)

## 2017-05-01 SURGERY — LEFT HEART CATH AND CORONARY ANGIOGRAPHY
Anesthesia: LOCAL

## 2017-05-01 MED ORDER — HEPARIN SODIUM (PORCINE) 1000 UNIT/ML IJ SOLN
INTRAMUSCULAR | Status: DC | PRN
  Administered 2017-05-01: 6500 [IU] via INTRAVENOUS

## 2017-05-01 MED ORDER — SODIUM CHLORIDE 0.9% FLUSH
3.0000 mL | Freq: Two times a day (BID) | INTRAVENOUS | Status: DC
  Administered 2017-05-01 – 2017-05-06 (×6): 3 mL via INTRAVENOUS

## 2017-05-01 MED ORDER — IOPAMIDOL (ISOVUE-370) INJECTION 76%
INTRAVENOUS | Status: AC
  Filled 2017-05-01: qty 100

## 2017-05-01 MED ORDER — SODIUM CHLORIDE 0.9% FLUSH: 3.0000 mL | Freq: Two times a day (BID) | INTRAVENOUS | Status: DC

## 2017-05-01 MED ORDER — SODIUM CHLORIDE 0.9% FLUSH: 3.0000 mL | INTRAVENOUS | Status: DC | PRN

## 2017-05-01 MED ORDER — SODIUM CHLORIDE 0.9 % IV SOLN
INTRAVENOUS | Status: AC
  Administered 2017-05-01: 16:00:00 via INTRAVENOUS

## 2017-05-01 MED ORDER — HEPARIN (PORCINE) IN NACL 2-0.9 UNIT/ML-% IJ SOLN
INTRAMUSCULAR | Status: AC
  Filled 2017-05-01: qty 1000

## 2017-05-01 MED ORDER — HEPARIN SODIUM (PORCINE) 1000 UNIT/ML IJ SOLN
INTRAMUSCULAR | Status: AC
  Filled 2017-05-01: qty 1

## 2017-05-01 MED ORDER — MIDAZOLAM HCL 2 MG/2ML IJ SOLN
INTRAMUSCULAR | Status: DC | PRN
  Administered 2017-05-01: 1 mg via INTRAVENOUS

## 2017-05-01 MED ORDER — SODIUM CHLORIDE 0.9 % IV SOLN: INTRAVENOUS | Status: DC

## 2017-05-01 MED ORDER — HEPARIN (PORCINE) IN NACL 100-0.45 UNIT/ML-% IJ SOLN
1600.0000 [IU]/h | INTRAMUSCULAR | Status: DC
  Administered 2017-05-01: 1400 [IU]/h via INTRAVENOUS
  Filled 2017-05-01 (×2): qty 250

## 2017-05-01 MED ORDER — FENTANYL CITRATE (PF) 100 MCG/2ML IJ SOLN
INTRAMUSCULAR | Status: AC
  Filled 2017-05-01: qty 2

## 2017-05-01 MED ORDER — HEPARIN (PORCINE) IN NACL 2-0.9 UNIT/ML-% IJ SOLN
INTRAMUSCULAR | Status: DC | PRN
  Administered 2017-05-01: 10 mL via INTRA_ARTERIAL

## 2017-05-01 MED ORDER — LIDOCAINE HCL (PF) 1 % IJ SOLN
INTRAMUSCULAR | Status: DC | PRN
  Administered 2017-05-01: 2 mL via SUBCUTANEOUS

## 2017-05-01 MED ORDER — SODIUM CHLORIDE 0.9 % IV SOLN: 250.0000 mL | INTRAVENOUS | Status: DC | PRN

## 2017-05-01 MED ORDER — FENTANYL CITRATE (PF) 100 MCG/2ML IJ SOLN
INTRAMUSCULAR | Status: DC | PRN
  Administered 2017-05-01: 25 ug via INTRAVENOUS

## 2017-05-01 MED ORDER — VERAPAMIL HCL 2.5 MG/ML IV SOLN
INTRAVENOUS | Status: AC
  Filled 2017-05-01: qty 2

## 2017-05-01 MED ORDER — IOPAMIDOL (ISOVUE-370) INJECTION 76%
INTRAVENOUS | Status: DC | PRN
  Administered 2017-05-01: 95 mL via INTRA_ARTERIAL

## 2017-05-01 MED ORDER — HEPARIN (PORCINE) IN NACL 2-0.9 UNIT/ML-% IJ SOLN
INTRAMUSCULAR | Status: AC | PRN
  Administered 2017-05-01: 1000 mL

## 2017-05-01 MED ORDER — MIDAZOLAM HCL 2 MG/2ML IJ SOLN
INTRAMUSCULAR | Status: AC
  Filled 2017-05-01: qty 2

## 2017-05-01 MED ORDER — LIDOCAINE HCL 1 % IJ SOLN
INTRAMUSCULAR | Status: AC
  Filled 2017-05-01: qty 20

## 2017-05-01 SURGICAL SUPPLY — 11 items
CATH INFINITI 5FR ANG PIGTAIL (CATHETERS) ×1 IMPLANT
CATH INFINITI 5FR JL4 (CATHETERS) ×1 IMPLANT
CATH OPTITORQUE TIG 4.0 5F (CATHETERS) ×1 IMPLANT
GLIDESHEATH SLEND A-KIT 6F 22G (SHEATH) ×1 IMPLANT
GUIDEWIRE INQWIRE 1.5J.035X260 (WIRE) IMPLANT
HOVERMATT SINGLE USE (MISCELLANEOUS) ×1 IMPLANT
INQWIRE 1.5J .035X260CM (WIRE) ×2
KIT HEART LEFT (KITS) ×2 IMPLANT
PACK CARDIAC CATHETERIZATION (CUSTOM PROCEDURE TRAY) ×2 IMPLANT
TRANSDUCER W/STOPCOCK (MISCELLANEOUS) ×2 IMPLANT
TUBING CIL FLEX 10 FLL-RA (TUBING) ×2 IMPLANT

## 2017-05-01 NOTE — H&P (View-Only) (Signed)
Progress Note  Patient Name: Justin Parsons Date of Encounter: 05/01/2017  Primary Cardiologist: Dr. Martinique  Subjective   Feels ok today. A&Ox 3. Denies CP. Breathing ok.   Inpatient Medications    Scheduled Meds: . aspirin EC  81 mg Oral Daily  . atorvastatin  80 mg Oral q1800  . folic acid  1 mg Oral Daily  . levothyroxine  50 mcg Oral QAC breakfast  . pantoprazole  40 mg Oral Daily  . sodium chloride flush  3 mL Intravenous Q12H  . thiamine injection  100 mg Intravenous Q24H   Continuous Infusions: . sodium chloride 10 mL/hr at 05/01/17 0300  . sodium chloride    . sodium chloride    . heparin 1,450 Units/hr (05/01/17 0554)   PRN Meds: sodium chloride, gi cocktail, levalbuterol, LORazepam, ondansetron, RESOURCE THICKENUP CLEAR, sodium chloride flush, traMADol   Vital Signs    Vitals:   04/30/17 2000 04/30/17 2303 05/01/17 0300 05/01/17 0500  BP: 101/61 108/79    Pulse: 91 (!) 31 91   Resp: (!) 22 19 20    Temp: 98.3 F (36.8 C) 98.6 F (37 C)    TempSrc: Oral Oral    SpO2: 90% 93% 99%   Weight:    282 lb 6.4 oz (128.1 kg)  Height:        Intake/Output Summary (Last 24 hours) at 05/01/17 0956 Last data filed at 05/01/17 0509  Gross per 24 hour  Intake          1236.09 ml  Output             1245 ml  Net            -8.91 ml   Filed Weights   04/29/17 0415 04/30/17 0315 05/01/17 0500  Weight: 279 lb 5.2 oz (126.7 kg) 287 lb 4.2 oz (130.3 kg) 282 lb 6.4 oz (128.1 kg)    Telemetry    Atrial fibrillation with a CVR - Personally Reviewed  ECG    Atrial fibrillation - Personally Reviewed  Physical Exam   GEN: No acute distress.  Obese  Neck: No JVD Cardiac: irregularlly irregular rhythm, regular rate, no murmurs, rubs, or gallops.  Respiratory: Clear to auscultation bilaterally. GI: Soft, nontender, non-distended, obese   MS: No edema; No deformity. Neuro:  Nonfocal  Psych: Normal affect   Labs    Chemistry Recent Labs Lab  04/29/17 1038 04/30/17 0149 05/01/17 0314  NA 138 137 137  K 5.0 4.5 4.5  CL 107 108 107  CO2 19* 22 22  GLUCOSE 88 82 118*  BUN 47* 40* 39*  CREATININE 3.25* 2.42* 2.42*  CALCIUM 8.5* 8.3* 8.2*  PROT  --   --  5.9*  ALBUMIN  --   --  3.1*  AST  --   --  34  ALT  --   --  20  ALKPHOS  --   --  59  BILITOT  --   --  1.1  GFRNONAA 17* 24* 24*  GFRAA 20* 28* 28*  ANIONGAP 12 7 8      Hematology Recent Labs Lab 04/30/17 0149 04/30/17 1547 05/01/17 0314  WBC 10.0 10.3 9.3  RBC 4.23 4.25 4.23  HGB 12.3* 12.3* 12.3*  HCT 38.8* 39.4 39.3  MCV 91.7 92.7 92.9  MCH 29.1 28.9 29.1  MCHC 31.7 31.2 31.3  RDW 17.0* 17.1* 17.1*  PLT 93* 115* 99*    Cardiac Enzymes Recent Labs Lab 04/28/17 1409 04/28/17 1930  TROPONINI 40.36* 35.27*   No results for input(s): TROPIPOC in the last 168 hours.   BNPNo results for input(s): BNP, PROBNP in the last 168 hours.   DDimer No results for input(s): DDIMER in the last 168 hours.   Radiology    Dg Shoulder 1v Right  Result Date: 04/30/2017 CLINICAL DATA:  right shoulder tenderness for several days. EXAM: RIGHT SHOULDER - 1 VIEW COMPARISON:  None. FINDINGS: Moderate degenerative changes are noted involving the acromioclavicular and glenohumeral joint. There is no fracture or subluxation identified. No radiopaque foreign bodies are soft tissue calcifications. IMPRESSION: 1. Acromioclavicular and glenohumeral joint osteoarthritis. Electronically Signed   By: Kerby Moors M.D.   On: 04/30/2017 10:05    Cardiac Studies   2D Echo 04/28/17  LV EF: 50% - 55%  ------------------------------------------------------------------- Indications: Atrial fibrillation - 427.31.  ------------------------------------------------------------------- History: Risk factors: Chronic kidney disease. Cirrhosis.  ------------------------------------------------------------------- Study Conclusions  - Left ventricle: Endocardium not well  seen. The cavity size was normal. Wall thickness was increased in a pattern of severe LVH. Systolic function was normal. The estimated ejection fraction was in the range of 50% to 55%. The study is not technically sufficient to allow evaluation of LV diastolic function. - Aortic valve: Calcified non coronary cusp. - Mitral valve: Calcfied anterior leaflet Calcified annulus. There was mild regurgitation. - Left atrium: The atrium was moderately dilated. - Atrial septum: No defect or patent foramen ovale was identified. - Pulmonary arteries: PA peak pressure: 49 mm Hg (S).  Patient Profile     76 yo WM w/ h/o cirrhosis of the liver, CKD, HTN and h/o TIA, transferred from ED at [facility] for evaluation of chest pain, hypotension and bradycardia. He was noted to be bradycardic with junctional rhythm and Afib. He was hypotensive with BP of 63/49. Given atropine IV and fluids. Stat CT of chest/abdomen/pelvis obtained which showed no acute process/ dissection/or PE. He ruled in for NSTEMI with troponin peaking at 40.36. Echo with normal LVEF at 50-55%. Also with AKI with admission troponin up to 3.6, now improved.   Assessment & Plan    1.  NSTEMI  - likely ACS, given troponin >40. CT head negative for stroke. On IV heparin - creatinine improved with  hydration- may be close to baseline. Plan is for [facility] today with possible PCI. I've discussed indication for procedure as well as procedural details including potential risk, which include but not limited to death, MI, stroke, vascular injury, nephrotoxicity, major bleeding and allergic reaction to contrast. Pt agrees to cath.   2.  Hypotension - was likely related to precedex. Resolved and no longer on sedation or pressors. BP is stable.    3. Cirrhosis with portal gastropathy - may not be a good long-term anticoagulation candidate due to this.  4. Atrial Fibrillation: ? Duration. May be chronic, rate-controlled at the moment. Rate in  the 80s-90s. On IV heparin. CHADSVASC score of at least 3. Consider long-term anticoagulation when we determine cause of NSTEMI, although may not be a good candidate given cirrhosis.   5.  AKI on CKD - baseline creatinine in 2014 was 2.1 ( now 2.4, improved from up to 3.6) - received lasix, but net positive. LVEF low normal at 50-55%. Monitor SCr closely post cath.   6. 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 He is awake and oriented x 3 currently.  7.  Thrombocytopenia - at 99 today. No S/S of bleeding.  Signed, Lyda Jester, PA-C  05/01/2017, 9:56  AM

## 2017-05-01 NOTE — Interval H&P Note (Signed)
History and Physical Interval Note:  05/01/2017 1:58 PM  Justin Parsons  has presented today for surgery, with the diagnosis of n stemi  The various methods of treatment have been discussed with the patient and family. After consideration of risks, benefits and other options for treatment, the patient has consented to  Procedure(s): Left Heart Cath and Coronary Angiography (N/A) with possible percutaneous coronary intervention if urgent. Eyes would prefer referral to another day if necessary. as a surgical intervention .  The patient's history has been reviewed, patient examined, no change in status, stable for surgery.  I have reviewed the patient's chart and labs.  Questions were answered to the patient's satisfaction.     [name]  Cath Lab Visit (complete for each Cath Lab visit)  Clinical Evaluation Leading to the Procedure:   ACS: Yes.    Non-ACS:    Anginal Classification: CCS III  Anti-ischemic medical therapy: No Therapy  Non-Invasive Test Results: No non-invasive testing performed  Prior CABG: No previous CABG

## 2017-05-01 NOTE — Progress Notes (Addendum)
ANTICOAGULATION CONSULT NOTE - Follow Up Consult  Pharmacy Consult:  Heparin  Indication: chest pain/ACS  No Known Allergies  Patient Measurements: Height: 5' 8"  (172.7 cm) Weight: 282 lb 6.4 oz (128.1 kg) IBW/kg (Calculated) : 68.4  Heparin dosing wt: 99 kg  Vital Signs: Temp: 98.7 F (37.1 C) (06/13 1500) BP: 105/43 (06/13 1500) Pulse Rate: 79 (06/13 1500)  Labs:  Recent Labs  04/28/17 1930  04/29/17 1038 04/30/17 0149 04/30/17 1547 05/01/17 0314 05/01/17 1142  HGB  --   < >  --  12.3* 12.3* 12.3*  --   HCT  --   < >  --  38.8* 39.4 39.3  --   PLT  --   < >  --  93* 115* 99*  --   HEPARINUNFRC  --   < > 0.40 0.35  --  0.23* 0.51  CREATININE  --   < > 3.25* 2.42*  --  2.42*  --   CKTOTAL  --   --  299  --   --   --   --   CKMB  --   --  12.8*  --   --   --   --   TROPONINI 35.27*  --   --   --   --   --   --   < > = values in this interval not displayed.  Estimated Creatinine Clearance: 33.4 mL/min (A) (by C-G formula based on SCr of 2.42 mg/dL (H)).   Assessment: Justin Parsons admitted with possible CVA and was ruled out.  Patient was started on IV heparin for NSTEMI.  He has a history of Afib and was deemed not to be a good candidate for anticoagulation due to portal gastropathy, cirrhosis and ongoing EtOH use.  Now s/p cath and CVTS is consulted to possible CABG.  Pharmacy consulted to resume IV heparin 6 hours post sheath removal.  Sheath removed around 1435 per procedural log.  No bleeding nor hematoma currently per RN.   Goal of Therapy:  Heparin level 0.3-0.7 units/ml Monitor platelets by anticoagulation protocol: Yes    Plan:  - At 2035, resume heparin gtt at 1400 units/hr - F/U AM labs, plts - Monitor for s/sx of bleeding, hematoma    Izek Corvino D. Mina Marble, PharmD, BCPS Pager:  [phone] 05/01/2017, 3:59 PM

## 2017-05-01 NOTE — Progress Notes (Signed)
Progress Note  Patient Name: Justin Parsons Date of Encounter: 05/01/2017  Primary Cardiologist: Dr. Martinique  Subjective   Feels ok today. A&Ox 3. Denies CP. Breathing ok.   Inpatient Medications    Scheduled Meds: . aspirin EC  81 mg Oral Daily  . atorvastatin  80 mg Oral q1800  . folic acid  1 mg Oral Daily  . levothyroxine  50 mcg Oral QAC breakfast  . pantoprazole  40 mg Oral Daily  . sodium chloride flush  3 mL Intravenous Q12H  . thiamine injection  100 mg Intravenous Q24H   Continuous Infusions: . sodium chloride 10 mL/hr at 05/01/17 0300  . sodium chloride    . sodium chloride    . heparin 1,450 Units/hr (05/01/17 0554)   PRN Meds: sodium chloride, gi cocktail, levalbuterol, LORazepam, ondansetron, RESOURCE THICKENUP CLEAR, sodium chloride flush, traMADol   Vital Signs    Vitals:   04/30/17 2000 04/30/17 2303 05/01/17 0300 05/01/17 0500  BP: 101/61 108/79    Pulse: 91 (!) 31 91   Resp: (!) 22 19 20    Temp: 98.3 F (36.8 C) 98.6 F (37 C)    TempSrc: Oral Oral    SpO2: 90% 93% 99%   Weight:    282 lb 6.4 oz (128.1 kg)  Height:        Intake/Output Summary (Last 24 hours) at 05/01/17 0956 Last data filed at 05/01/17 0509  Gross per 24 hour  Intake          1236.09 ml  Output             1245 ml  Net            -8.91 ml   Filed Weights   04/29/17 0415 04/30/17 0315 05/01/17 0500  Weight: 279 lb 5.2 oz (126.7 kg) 287 lb 4.2 oz (130.3 kg) 282 lb 6.4 oz (128.1 kg)    Telemetry    Atrial fibrillation with a CVR - Personally Reviewed  ECG    Atrial fibrillation - Personally Reviewed  Physical Exam   GEN: No acute distress.  Obese  Neck: No JVD Cardiac: irregularlly irregular rhythm, regular rate, no murmurs, rubs, or gallops.  Respiratory: Clear to auscultation bilaterally. GI: Soft, nontender, non-distended, obese   MS: No edema; No deformity. Neuro:  Nonfocal  Psych: Normal affect   Labs    Chemistry Recent Labs Lab  04/29/17 1038 04/30/17 0149 05/01/17 0314  NA 138 137 137  K 5.0 4.5 4.5  CL 107 108 107  CO2 19* 22 22  GLUCOSE 88 82 118*  BUN 47* 40* 39*  CREATININE 3.25* 2.42* 2.42*  CALCIUM 8.5* 8.3* 8.2*  PROT  --   --  5.9*  ALBUMIN  --   --  3.1*  AST  --   --  34  ALT  --   --  20  ALKPHOS  --   --  59  BILITOT  --   --  1.1  GFRNONAA 17* 24* 24*  GFRAA 20* 28* 28*  ANIONGAP 12 7 8      Hematology Recent Labs Lab 04/30/17 0149 04/30/17 1547 05/01/17 0314  WBC 10.0 10.3 9.3  RBC 4.23 4.25 4.23  HGB 12.3* 12.3* 12.3*  HCT 38.8* 39.4 39.3  MCV 91.7 92.7 92.9  MCH 29.1 28.9 29.1  MCHC 31.7 31.2 31.3  RDW 17.0* 17.1* 17.1*  PLT 93* 115* 99*    Cardiac Enzymes Recent Labs Lab 04/28/17 1409 04/28/17 1930  TROPONINI 40.36* 35.27*   No results for input(s): TROPIPOC in the last 168 hours.   BNPNo results for input(s): BNP, PROBNP in the last 168 hours.   DDimer No results for input(s): DDIMER in the last 168 hours.   Radiology    Dg Shoulder 1v Right  Result Date: 04/30/2017 CLINICAL DATA:  right shoulder tenderness for several days. EXAM: RIGHT SHOULDER - 1 VIEW COMPARISON:  None. FINDINGS: Moderate degenerative changes are noted involving the acromioclavicular and glenohumeral joint. There is no fracture or subluxation identified. No radiopaque foreign bodies are soft tissue calcifications. IMPRESSION: 1. Acromioclavicular and glenohumeral joint osteoarthritis. Electronically Signed   By: Kerby Moors M.D.   On: 04/30/2017 10:05    Cardiac Studies   2D Echo 04/28/17  LV EF: 50% - 55%  ------------------------------------------------------------------- Indications: Atrial fibrillation - 427.31.  ------------------------------------------------------------------- History: Risk factors: Chronic kidney disease. Cirrhosis.  ------------------------------------------------------------------- Study Conclusions  - Left ventricle: Endocardium not well  seen. The cavity size was normal. Wall thickness was increased in a pattern of severe LVH. Systolic function was normal. The estimated ejection fraction was in the range of 50% to 55%. The study is not technically sufficient to allow evaluation of LV diastolic function. - Aortic valve: Calcified non coronary cusp. - Mitral valve: Calcfied anterior leaflet Calcified annulus. There was mild regurgitation. - Left atrium: The atrium was moderately dilated. - Atrial septum: No defect or patent foramen ovale was identified. - Pulmonary arteries: PA peak pressure: 49 mm Hg (S).  Patient Profile     76 yo WM w/ h/o cirrhosis of the liver, CKD, HTN and h/o TIA, transferred from ED at [facility] for evaluation of chest pain, hypotension and bradycardia. He was noted to be bradycardic with junctional rhythm and Afib. He was hypotensive with BP of 63/49. Given atropine IV and fluids. Stat CT of chest/abdomen/pelvis obtained which showed no acute process/ dissection/or PE. He ruled in for NSTEMI with troponin peaking at 40.36. Echo with normal LVEF at 50-55%. Also with AKI with admission troponin up to 3.6, now improved.   Assessment & Plan    1.  NSTEMI  - likely ACS, given troponin >40. CT head negative for stroke. On IV heparin - creatinine improved with  hydration- may be close to baseline. Plan is for [facility] today with possible PCI. I've discussed indication for procedure as well as procedural details including potential risk, which include but not limited to death, MI, stroke, vascular injury, nephrotoxicity, major bleeding and allergic reaction to contrast. Pt agrees to cath.   2.  Hypotension - was likely related to precedex. Resolved and no longer on sedation or pressors. BP is stable.    3. Cirrhosis with portal gastropathy - may not be a good long-term anticoagulation candidate due to this.  4. Atrial Fibrillation: ? Duration. May be chronic, rate-controlled at the moment. Rate in  the 80s-90s. On IV heparin. CHADSVASC score of at least 3. Consider long-term anticoagulation when we determine cause of NSTEMI, although may not be a good candidate given cirrhosis.   5.  AKI on CKD - baseline creatinine in 2014 was 2.1 ( now 2.4, improved from up to 3.6) - received lasix, but net positive. LVEF low normal at 50-55%. Monitor SCr closely post cath.   6. 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 He is awake and oriented x 3 currently.  7.  Thrombocytopenia - at 99 today. No S/S of bleeding.  Signed, Lyda Jester, PA-C  05/01/2017, 9:56  AM

## 2017-05-01 NOTE — Progress Notes (Signed)
Patient ID: Justin Parsons, male   DOB: Jan 22, 1941, 76 y.o.   MRN: [id number]      [address]       [place] [address]             [facility] Record #[id number] Date of Birth: 04-28-1941  Referring: Dr Debara Pickett Primary Care: Monico Blitz, MD  Chief Complaint:   Admitted 4 days ago with alter mental status   History of Present Illness:     Patient is 76 y.o. male with history of CKD and cirrhosis . He was admitted 4 days ago when  developed a pre-syncopal episode after working out his field today.  He had not been feeling well the last few days, and after he became acutely ill, he progressively worsened over a few hours with worsening encephalopathy and aphasia. Prior to transfer records indicate patient developed mental status changes with slurred speech at about 1600. CT head was done showing no acute findings. Patient found by [facility] to be globally aphasic and he remained so on arrival here. CODE STROKE called On arrival.    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     Zubrod Score: At the time of surgery this patient's most appropriate activity status/level should be described as: []     0    Normal activity, no symptoms [x]     1    Restricted in physical strenuous activity but ambulatory, able to do out light work []     2    Ambulatory and capable of self care, unable to do work activities, up and about                 more than 50%  Of the time                            []     3    Only limited self care, in bed greater than 50% of waking hours []     4    Completely disabled, no self care, confined to bed or chair []     5    Moribund  Past Medical History:  Diagnosis Date  . Acute renal failure (ARF) ([place]) 05/2012  . Adenomatous polyp  of colon   . Alcoholism ([place])    moonshine  . Arthritis   . 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 Cirrhosis of liver ([place])    completed Hep A/B vaccinations 2015/2016  . CKD (chronic kidney disease)   . Hiatal hernia   . HTN (hypertension)   . Hypothyroidism   . 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 TIA (transient ischemic attack)     Past Surgical History:  Procedure Laterality Date  . CARDIAC CATHETERIZATION  2003  . COLONOSCOPY  08/2008   normal, repeat exam in 5-7 years  . COLONOSCOPY  2004   rectal adenomatous polyp  . COLONOSCOPY N/A 12/01/2014   QIO:NGEXBM rectum/elongated colon  . ESOPHAGEAL DILATION N/A 02/07/2015   Procedure: ESOPHAGEAL DILATION;  Surgeon: Daneil Dolin, MD;  [facility];  Service: Endoscopy;  Laterality: N/A;  . ESOPHAGOGASTRODUODENOSCOPY  02/11/2012   Dr. Gala Romney: portal gastropathy, gastric erosions, esophageal ulcerations likely pill-induced, surveillance in 2 years  . ESOPHAGOGASTRODUODENOSCOPY N/A  12/01/2014   Dr. Rourk:distal esophageal stricture dilated with the scope passage, portal gastropathy, negative H.pylori on gastric biopsies, esophageal biopsies benign  . ESOPHAGOGASTRODUODENOSCOPY N/A 02/07/2015   Procedure: ESOPHAGOGASTRODUODENOSCOPY (EGD);  Surgeon: Daneil Dolin, MD;  Location: [facility];  Service: Endoscopy;  Laterality: N/A;  1115  . EYE SURGERY    . JOINT REPLACEMENT    . s/p eye implant  1997   artificial eye, right  . TOTAL HIP ARTHROPLASTY  2002  . TOTAL HIP ARTHROPLASTY  2006/2012   revision in 2012  . TOTAL KNEE ARTHROPLASTY  1999/2003   left/right    History  Smoking Status  . Never Smoker  Smokeless Tobacco  . Never Used    Comment: used to chew tobacco, none in 15 years    History  Alcohol Use  . 0.0 oz/week    Comment: half a gallon moonshine per week    Social History   Social History  . Marital status: Divorced    Spouse name: N/A  . Number of children: N/A  . Years of education:  N/A   Occupational History  . Not on file.   Social History Main Topics  . Smoking status: Never Smoker  . Smokeless tobacco: Never Used     Comment: used to chew tobacco, none in 15 years  . Alcohol use 0.0 oz/week     Comment: half a gallon moonshine per week  . Drug use: No  . Sexual activity: Not Currently   Other Topics Concern  . Not on file   Social History Narrative   One son deceased while in prison, drug addiction.    No Known Allergies  Current Facility-Administered Medications  Medication Dose Route Frequency Provider Last Rate Last Dose  . 0.9 %  sodium chloride infusion   Intravenous Continuous Javier Glazier, MD 10 mL/hr at 05/01/17 1406    . aspirin EC tablet 81 mg  81 mg Oral Daily Mauri Brooklyn, MD   81 mg at 05/01/17 1000  . atorvastatin (LIPITOR) tablet 80 mg  80 mg Oral q1800 Pixie Casino, MD   80 mg at 04/29/17 1808  . folic acid (FOLVITE) tablet 1 mg  1 mg Oral Daily Javier Glazier, MD   1 mg at 05/01/17 1000  . gi cocktail (Maalox,Lidocaine,Donnatal)  30 mL Oral BID PRN Alfredo Martinez, Brandi L, NP   30 mL at 04/28/17 0900  . heparin ADULT infusion 100 units/mL (25000 units/225m sodium chloride 0.45%)  1,450 Units/hr Intravenous Continuous AFranky Macho RPH   Stopped at 05/01/17 1400  . levalbuterol (XOPENEX) nebulizer solution 0.63 mg  0.63 mg Nebulization Q6H PRN BCollene Gobble MD   0.63 mg at 04/28/17 0356  . levothyroxine (SYNTHROID, LEVOTHROID) tablet 50 mcg  50 mcg Oral QAC breakfast NJavier Glazier MD   50 mcg at 05/01/17 0800  . LORazepam (ATIVAN) tablet 2-3 mg  2-3 mg Oral Q1H PRN BCollene Gobble MD      . ondansetron ([facility] injection 4 mg  4 mg Intravenous Q8H PRN Deterding, EGuadelupe Sabin MD      . pantoprazole (PROTONIX) EC tablet 40 mg  40 mg Oral Daily JMartinique Peter M, MD   40 mg at 05/01/17 1000  . RESOURCE THICKENUP CLEAR   Oral PRN BCollene Gobble MD      . thiamine (B-1) injection 100 mg  100 mg Intravenous Q24H SMarliss Coots PA-C   100 mg at 05/01/17 1000  .  traMADol (ULTRAM) tablet 50 mg  50 mg Oral Q12H PRN Javier Glazier, MD   50 mg at 04/30/17 2151    Prescriptions Prior to Admission  Medication Sig Dispense Refill Last Dose  . allopurinol (ZYLOPRIM) 100 MG tablet Take 100 mg by mouth daily.     04/27/2017 at Unknown time  . Ascorbic Acid (VITAMIN C PO) Take 1 tablet by mouth daily.   04/28/2017 at Unknown time  . aspirin EC 81 MG tablet Take 162 mg by mouth daily.    04/27/2017 at Unknown time  . atenolol (TENORMIN) 50 MG tablet Take 50 mg by mouth Daily.   Unknown  . cephALEXin (KEFLEX) 500 MG capsule Take 500 mg by mouth 4 (four) times daily. Take before dental appt   Unknown  . diclofenac (VOLTAREN) 75 MG EC tablet Take 75 mg by mouth 2 (two) times daily.   04/28/2017 at Unknown time  . furosemide (LASIX) 40 MG tablet Take 40 mg by mouth at bedtime.   04/27/2017 at Unknown time  . levothyroxine (SYNTHROID, LEVOTHROID) 50 MCG tablet Take 50 mcg by mouth daily.   04/28/2017 at Unknown time  . omeprazole (PRILOSEC) 40 MG capsule Take 1 capsule (40 mg total) by mouth 2 (two) times daily. (Patient taking differently: Take 40 mg by mouth 2 (two) times daily as needed. ) 60 capsule 5 04/27/2017 at Unknown time  . amLODipine (NORVASC) 2.5 MG tablet Take 2.5 mg by mouth daily.     Not Taking at Unknown time  . oxymetazoline (AFRIN) 0.05 % nasal spray Place 1 spray into both nostrils 2 (two) times daily as needed for congestion.   Not Taking at Unknown time  . traMADol (ULTRAM) 50 MG tablet Take 50 mg by mouth 3 (three) times daily.   Unknown    Family History  Problem Relation Age of Onset  . Ovarian cancer Sister   . Colon cancer Neg Hx      Review of Systems:      Cardiac Review of Systems: Y or N  Chest Pain [  y  ]  Resting SOB [n   ] Exertional SOB  Blue.Reese  ]  Orthopnea [ n ]   Pedal Edema Blue.Reese   ]    Palpitations [ y] Syncope  [ y ]   Presyncope Blue.Reese   ]  General Review of Systems: [Y] = yes [   ]=no Constitional: recent weight change [  ]; anorexia [  ]; fatigue [  ]; nausea [  ]; night sweats [  ]; fever [  ]; or chills [  ]                                                               Dental: poor dentition[  ]; Last Dentist visit:   Eye : blurred vision [  ]; diplopia [   ]; vision changes [  ];  Amaurosis fugax[  ]; Resp: cough [  ];  wheezing[  ];  hemoptysis[  ]; shortness of breath[  ]; paroxysmal nocturnal dyspnea[  ]; dyspnea on exertion[  ]; or orthopnea[  ];  GI:  gallstones[  ], vomiting[  ];  dysphagia[  ]; melena[  ];  hematochezia [  ]; heartburn[  ];  Hx of  Colonoscopy[  ]; GU: kidney stones [  ]; hematuria[  ];   dysuria [  ];  nocturia[  ];  history of     obstruction [  ]; urinary frequency [  ]             Skin: rash, swelling[  ];, hair loss[  ];  peripheral edema[  ];  or itching[  ]; Musculosketetal: myalgias[  ];  joint swelling[  ];  joint erythema[  ];  joint pain[  ];  back pain[  ];  Heme/Lymph: bruising[  ];  bleeding[  ];  anemia[  ];  Neuro: TIA[  ];  headaches[  ];  stroke[ ? ];  vertigo[  ];  seizures[  ];   paresthesias[  ];  difficulty walking[y  ];  Psych:depression[  ]; anxiety[  ];  Endocrine: diabetes[  ];  thyroid dysfunction[  ];  Immunizations: Flu [  ]; Pneumococcal[  ];  Other:  Physical Exam: BP (!) 105/43 (BP Location: Left Arm)   Pulse 79   Temp 98.7 F (37.1 C)   Resp 20   Ht 5' 8"  (1.727 m)   Wt 282 lb 6.4 oz (128.1 kg)   SpO2 96%   BMI 42.94 kg/m    General appearance: alert, cooperative, appears older than stated age, slowed mentation and mildy  confused  Head: Normocephalic, without obvious abnormality Neck: no adenopathy, no carotid bruit, no JVD, supple, symmetrical, trachea midline and thyroid not enlarged, symmetric, no tenderness/mass/nodules Lymph nodes: Cervical, supraclavicular, and axillary nodes normal. Resp: diminished breath sounds bibasilar Back: symmetric, no curvature. ROM normal. No CVA  tenderness. Cardio: regular rate and rhythm, S1, S2 normal, no murmur, click, rub or gallop GI: soft, non-tender; bowel sounds normal; no masses,  no organomegaly Extremities: extremities normal, atraumatic, no cyanosis or edema Neurologic:  As noted admitted with  Altered mental status   Diagnostic Studies & Laboratory data:     Recent Radiology Findings:   Dg Shoulder 1v Right  Result Date: 04/30/2017 CLINICAL DATA:  right shoulder tenderness for several days. EXAM: RIGHT SHOULDER - 1 VIEW COMPARISON:  None. FINDINGS: Moderate degenerative changes are noted involving the acromioclavicular and glenohumeral joint. There is no fracture or subluxation identified. No radiopaque foreign bodies are soft tissue calcifications. IMPRESSION: 1. Acromioclavicular and glenohumeral joint osteoarthritis. Electronically Signed   By: Kerby Moors M.D.   On: 04/30/2017 10:05     I have independently reviewed the above radiologic studies.  Recent Lab Findings: Lab Results  Component Value Date   WBC 9.3 05/01/2017   HGB 12.3 (L) 05/01/2017   HCT 39.3 05/01/2017   PLT 99 (L) 05/01/2017   GLUCOSE 118 (H) 05/01/2017   CHOL 123 04/30/2017   TRIG 85 04/30/2017   HDL 35 (L) 04/30/2017   LDLCALC 71 04/30/2017   ALT 20 05/01/2017   AST 34 05/01/2017   NA 137 05/01/2017   K 4.5 05/01/2017   CL 107 05/01/2017   CREATININE 2.42 (H) 05/01/2017   BUN 39 (H) 05/01/2017   CO2 22 05/01/2017   TSH 4.853 (H) 04/27/2017   INR 1.25 04/28/2017   HGBA1C 5.7 (H) 11/12/2013   Conclusion     Ost LAD lesion, 85 %stenosed - apparently thrombotic with TIMI 2 flow distally  1st Diag lesion(major trunk of diagonal), 100 %stenosed.  1st Mrg lesion, 100 %stenosed.  Extensively diseased RCA that is a very large caliber vessel/ectatic with extensive thrombus in the proximal to  mid portion with reduced flow distally  Prox RCA to Mid RCA lesion, 75 %stenosed. heavily thrombotic, ulcerated irregular vessel  Mid  RCA to Dist RCA lesion, 60 %stenosed. Dist RCA to tandem lesions, 70 %stenosed.  Post Atrio lesion, 80 %stenosed - also appears to be ulcerated and thrombotic with some ectatic segments.  HEMODYNAMICS  LV end diastolic pressure is mildly elevated.  There is no aortic valve stenosis.   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 The 2 occluded vessels being diagonal branch and OM are not very large caliber vessels, however diagonal branch does appear to have the potential cover large area of distribution.  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 He would not be a good candidate for PCI as this would require extensive stents, and elevate the LAD lesion is amenable to PCI. I will restart IV heparin for atrial fibrillation (I think were too far out to start Aggrastat).   The case was discussed with Dr. Debara Pickett who will contact CT surgery for consultation to determine his appropriateness for CABG   [name], MD   Chronic Kidney Disease   Stage I     GFR >90  Stage II    GFR 60-89  Stage IIIA GFR 45-59  Stage IIIB GFR 30-44  Stage IV   GFR 15-29  Stage V    GFR  <15  Lab Results  Component Value Date   CREATININE 2.42 (H) 05/01/2017   Estimated Creatinine Clearance: 33.4 mL/min (A) (by C-G formula based on SCr of 2.42 mg/dL (H)).  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 Portal hypertension  Patient is not candidate  For CABG with numerous  medical problems recent period encephalopathy , ckd and likely cirrhosis. Consider anticoagulation , then PCI approach to lad lesion and possibly RCA  I  spent 45 minutes counseling the  patient face to face and 50% or more the  time was spent in counseling and coordination of care. The total time spent in the appointment was 60 minutes.    Grace Isaac MD      [address] [place] [address] Office [phone]   Beeper [phone]  05/01/2017 3:41 PM

## 2017-05-01 NOTE — Progress Notes (Signed)
[facility] PROGRESS NOTE  Justin Parsons RPR:[id number] DOB: [birth year] DOA: 04/27/2017 PCP: Monico Blitz, MD   Interim summary and history of present illness  76 year old man with a notable history of chronic kidney disease stage III, alcoholism, hypertension, hypothyroidism, gout and GERD; who was admitted secondary to presyncope while working outside. Patient was found with acute on chronic renal failure and circulatory shock suspected to be secondary to NSTEMI.   Assessment/Plan: 1-AMS/acute encephalopathy/aphasia -Most likely associated with shock brain due to hypotension and decreased perfusion; also considering associated to alcohol and the use of Neurontin in the presence of acute renal failure. -Patient mentation has now resolved -No evidence of acute stroke radiographically -Negative EEG -Vital signs stable -Will follow clinical response -patient received precedex on admission, now off and stable overall.   2-NSTEMI -Cardiology on board -Status post left heart cath demonstrating multiple vessel disease; cardiothoracic surgery has been consulted to evaluation for CABG -continue heparin drip  3-acute on chronic renal failure stage III at baseline: Creatinine in the range of 2.1 about 3 years ago -Continue avoiding nephrotoxic agents as much as possible -Patient has received IV fluids and electrolyte repletion -Creatinine down to 2.4 currently -Will monitor treatment  4-atrial fibrillation: unknown duration, but most likely chronic  -CHADsVASC score 3 -continue heparin drip -rate controlled at this moment -will follow cardiology rec's  5-alcohol abuse and prior hx of liver cirrhosis with portal gastropathy  -normal LFT's and INR -cessation counseling provided -no signs of acute bleeding  -will monitor closely   6-thrombocytopenia -will monitor trend -no signs of acute bleeding   7-circulatory shock/hypotension -most likely associated with  NSTEMI -troponin peaked at >40 -no CP -BP is stable now, no pressors -precedex contributing to hypotension as well -will monitor -cardiology on board.   Code Status: Full code Family Communication: No family at bedside Disposition Plan: To be determined. Patient is status post left heart cath in need of evaluation by cardiothoracic surgery for potential CABG. we'll follow renal function closely and per prior physical therapy/occupational therapy evaluation might require SNF for rehabilitation after discharge. Will keep in stepdown overnight.   Consultants:  PCCM  Cardiology   Cardiothoracic surgery  Neurology   Procedures:  See below for x-ray reports  2-D echo - Left ventricle: Endocardium not well seen. The cavity size was normal. Wall thickness was increased in a pattern of severe LVH. Systolic function was normal. The estimated ejection fraction was in the range of 50% to 55%. The study is not technically sufficient to allow evaluation of LV diastolic function. - Aortic valve: Calcified non coronary cusp. - Mitral valve: Calcfied anterior leaflet Calcified annulus. There was mild regurgitation. - Left atrium: The atrium was moderately dilated. - Atrial septum: No defect or patent foramen ovale was identified. - Pulmonary arteries: PA peak pressure: 49 mm Hg (S).  Left heart cath: Multiple vessels are a stenosis and thrombotic, not a good candidate for PCI therapy. Cardiology has discussed with cardiothoracic surgeon for evaluation regarding CABG.  Antibiotics:  None  HPI/Subjective: Patient is afebrile, denies chest pain and shortness of breath. He is alert, awake and oriented 3 and requesting something to eat.  Objective: Vitals:   05/01/17 1445 05/01/17 1500  BP:  (!) 105/43  Pulse: (!) 0 79  Resp: (!) 0 20  Temp:  98.7 F (37.1 C)    Intake/Output Summary (Last 24 hours) at 05/01/17 1536 Last data filed at 05/01/17 1406  Gross per 24 hour   Intake  817.57 ml  Output             1375 ml  Net          -557.43 ml   Filed Weights   04/29/17 0415 04/30/17 0315 05/01/17 0500  Weight: 126.7 kg (279 lb 5.2 oz) 130.3 kg (287 lb 4.2 oz) 128.1 kg (282 lb 6.4 oz)    Exam:   General:  Patient is afebrile, denies chest pain, no shortness of breath. He is alert, awake and oriented 3. Denies nausea, vomiting, dysuria and any other complaints.  Cardiovascular: Regular rate, no rubs, no gallops, no murmurs, no JVD.  Respiratory: Good air movement bilaterally, no wheezing, no crackles appreciated.  Abdomen:  obese, soft, nontender, nondistended, positive bowel sounds   Musculoskeletal: trace edema bilaterally, no cyanosis, no clubbing.    Data Reviewed: Basic Metabolic Panel:  Recent Labs Lab 04/28/17 0042 04/29/17 0043 04/29/17 1038 04/30/17 0149 05/01/17 0314  NA 138 136 138 137 137  K 5.5* 4.7 5.0 4.5 4.5  CL 110 104 107 108 107  CO2 20* 24 19* 22 22  GLUCOSE 105* 114* 88 82 118*  BUN 50* 49* 47* 40* 39*  CREATININE 3.36* 3.41* 3.25* 2.42* 2.42*  CALCIUM 8.0* 8.3* 8.5* 8.3* 8.2*  MG 1.6* 1.9  --   --  1.9  PHOS 5.1* 4.6  --   --  3.3   Liver Function Tests:  Recent Labs Lab 05/01/17 0314  AST 34  ALT 20  ALKPHOS 59  BILITOT 1.1  PROT 5.9*  ALBUMIN 3.1*    Recent Labs Lab 04/27/17 2129  AMMONIA 18   CBC:  Recent Labs Lab 04/28/17 0042 04/29/17 0043 04/30/17 0149 04/30/17 1547 05/01/17 0314  WBC 13.3* 16.9* 10.0 10.3 9.3  NEUTROABS 10.1*  --   --  8.1*  --   HGB 14.0 13.5 12.3* 12.3* 12.3*  HCT 43.4 42.2 38.8* 39.4 39.3  MCV 91.6 91.1 91.7 92.7 92.9  PLT 110* 133* 93* 115* 99*   Cardiac Enzymes:  Recent Labs Lab 04/28/17 0042 04/28/17 1409 04/28/17 1930 04/29/17 1038  CKTOTAL 935*  --   --  299  CKMB  --   --   --  12.8*  TROPONINI  --  40.36* 35.27*  --    CBG: No results for input(s): GLUCAP in the last 168 hours.  Recent Results (from the past 240 hour(s))  MRSA  PCR Screening     Status: None   Collection Time: 04/27/17  6:38 PM  Result Value Ref Range Status   MRSA by PCR NEGATIVE NEGATIVE Final    Comment:        The GeneXpert MRSA Assay (FDA approved for NASAL specimens only), is one component of a comprehensive MRSA colonization surveillance program. It is not intended to diagnose MRSA infection nor to guide or monitor treatment for MRSA infections.      Studies: Dg Shoulder 1v Right  Result Date: 04/30/2017 CLINICAL DATA:  right shoulder tenderness for several days. EXAM: RIGHT SHOULDER - 1 VIEW COMPARISON:  None. FINDINGS: Moderate degenerative changes are noted involving the acromioclavicular and glenohumeral joint. There is no fracture or subluxation identified. No radiopaque foreign bodies are soft tissue calcifications. IMPRESSION: 1. Acromioclavicular and glenohumeral joint osteoarthritis. Electronically Signed   By: Kerby Moors M.D.   On: 04/30/2017 10:05    Scheduled Meds: . aspirin EC  81 mg Oral Daily  . atorvastatin  80 mg Oral q1800  . folic acid  1 mg Oral Daily  . levothyroxine  50 mcg Oral QAC breakfast  . pantoprazole  40 mg Oral Daily  . thiamine injection  100 mg Intravenous Q24H   Continuous Infusions: . sodium chloride 10 mL/hr at 05/01/17 1406  . heparin Stopped (05/01/17 1400)      ARF (acute renal failure) (HCC)   CKD stage 3   A-fib (HCC)   Acute encephalopathy   Confusion   Non-ST elevation (NSTEMI) myocardial infarction [facility])   Shock circulatory ([place])    Time spent: 35 minutes   Barton Dubois  [facility] Pager [phone] If 7PM-7AM, please contact night-coverage at [email or web address], password [facility] 05/01/2017, 3:36 PM  LOS: 4 days

## 2017-05-01 NOTE — Progress Notes (Signed)
ANTICOAGULATION CONSULT NOTE - Follow Up Consult  Pharmacy Consult for Heparin  Indication: chest pain/ACS  No Known Allergies  Patient Measurements: Height: 5' 8"  (172.7 cm) Weight: 287 lb 4.2 oz (130.3 kg) IBW/kg (Calculated) : 68.4  Heparin dosing wt: 99 kg  Vital Signs: Temp: 98.6 F (37 C) (06/12 2303) Temp Source: Oral (06/12 2303) BP: 108/79 (06/12 2303) Pulse Rate: 91 (06/13 0300)  Labs:  Recent Labs  04/28/17 1409 04/28/17 1930  04/29/17 0043 04/29/17 1038 04/30/17 0149 04/30/17 1547 05/01/17 0314  HGB  --   --   < > 13.5  --  12.3* 12.3* 12.3*  HCT  --   --   < > 42.2  --  38.8* 39.4 39.3  PLT  --   --   < > 133*  --  93* 115* 99*  HEPARINUNFRC  --   --   < > 0.46 0.40 0.35  --  0.23*  CREATININE  --   --   --  3.41* 3.25* 2.42*  --   --   CKTOTAL  --   --   --   --  299  --   --   --   CKMB  --   --   --   --  12.8*  --   --   --   TROPONINI 40.36* 35.27*  --   --   --   --   --   --   < > = values in this interval not displayed.  Estimated Creatinine Clearance: 33.7 mL/min (A) (by C-G formula based on SCr of 2.42 mg/dL (H)).   Assessment: 17 yom admitted with possible stroke but neuro now saying pt without acute infarct. Pt on heparin gtt for NSTEMI. Heparin level down to subtherapeutic at 0.23. CBC stable. No issues with line or bleeding reported per RN.  Hx of afib deemed not a candidate for anticoagulation due to portal gastropathy, cirrhosis, and ongoing ETOH use.  Goal of Therapy:  Heparin level 0.3-0.7 units/ml Monitor platelets by anticoagulation protocol: Yes   Plan:  Increase heparin to 1450 units/hr Will f/u 8 hr heparin level  Sherlon Handing, PharmD, BCPS Clinical pharmacist, pager [phone] 05/01/2017,3:55 AM

## 2017-05-01 NOTE — NC FL2 (Signed)
[place] LEVEL OF CARE SCREENING TOOL     IDENTIFICATION  Patient Name: Justin Parsons Birthdate: 1941-01-12 Sex: male Admission Date (Current Location): 04/27/2017  [facility] and [place] Number:  Herbalist and Address:  [facility], [place] [address], [place], [place] [address]      Provider Number: [id number]  Attending Physician Name and Address:  Barton Dubois, MD  Relative Name and Phone Number:       Current Level of Care: Hospital Recommended Level of Care: [place] Prior Approval Number:    Date Approved/Denied:   PASRR Number: [id number]  Discharge Plan: SNF    Current Diagnoses: Patient Active Problem List   Diagnosis Date Noted  . Non-ST elevation (NSTEMI) myocardial infarction ([place]) 04/29/2017  . Delirium tremens ([place])   . Shock circulatory ([place])   . Confusion   . Acute encephalopathy 04/27/2017  . Schatzki's ring   . Esophageal varices ([place])   . History of esophageal stricture 01/20/2015  . Esophageal stricture   . History of colonic polyps   . Dysphagia, pharyngoesophageal phase 11/04/2014  . Suicidal ideation 11/17/2013  . Acute renal failure ([place]) 11/17/2013  . Acute diastolic CHF (congestive heart failure) ([place]) 11/13/2013  . Acute respiratory failure ([place]) 11/13/2013  . Community acquired pneumonia 11/12/2013  . A-fib ([place]) 11/12/2013  . CKD (chronic kidney disease), stage III 11/12/2013  . Unspecified hypothyroidism 11/12/2013  . Dyspnea 11/12/2013  . Thrombocytopenia ([place]) 06/05/2012  . ARF (acute renal failure) ([place]) 06/04/2012  . Obesity 06/04/2012  . Cirrhosis ([place]) 01/29/2012  . Adenomatous polyp 01/29/2012  . Renal insufficiency 05/01/2011  . Anemia 05/01/2011  . Rectal bleed 05/01/2011  . GERD (gastroesophageal reflux disease) 05/01/2011    Orientation RESPIRATION BLADDER Height & Weight     Self, Time, Situation, Place  O2 (Nasal Cannula; 4L) Continent  Weight: 282 lb 6.4 oz (128.1 kg) Height:  5' 8"  (172.7 cm)  BEHAVIORAL SYMPTOMS/MOOD NEUROLOGICAL BOWEL NUTRITION STATUS      Continent  (Please see d/c summary)  AMBULATORY STATUS COMMUNICATION OF NEEDS Skin   Limited Assist Verbally Normal                       Personal Care Assistance Level of Assistance  Bathing, Feeding, Dressing Bathing Assistance: Limited assistance Feeding assistance: Independent Dressing Assistance: Limited assistance     Functional Limitations Info  Sight, Hearing, Speech Sight Info: Adequate Hearing Info: Adequate Speech Info: Adequate    SPECIAL CARE FACTORS FREQUENCY  PT (By licensed PT), OT (By licensed OT)     PT Frequency: 4x week OT Frequency: 4x week            Contractures Contractures Info: Not present    Additional Factors Info  Code Status, Allergies Code Status Info: Full Code Allergies Info: No known allergies           Current Medications (05/01/2017):  This is the current hospital active medication list Current Facility-Administered Medications  Medication Dose Route Frequency Provider Last Rate Last Dose  . 0.9 %  sodium chloride infusion   Intravenous Continuous Javier Glazier, MD 10 mL/hr at 05/01/17 0300    . 0.9 %  sodium chloride infusion  250 mL Intravenous PRN Hilty, Nadean Corwin, MD      . 0.9 %  sodium chloride infusion   Intravenous Continuous Hilty, Nadean Corwin, MD      . aspirin EC tablet 81  mg  81 mg Oral Daily Mauri Brooklyn, MD   81 mg at 04/30/17 1004  . atorvastatin (LIPITOR) tablet 80 mg  80 mg Oral q1800 Pixie Casino, MD   80 mg at 04/29/17 1808  . folic acid (FOLVITE) tablet 1 mg  1 mg Oral Daily Javier Glazier, MD   1 mg at 04/30/17 1004  . gi cocktail (Maalox,Lidocaine,Donnatal)  30 mL Oral BID PRN Alfredo Martinez, Brandi L, NP   30 mL at 04/28/17 0900  . heparin ADULT infusion 100 units/mL (25000 units/229m sodium chloride 0.45%)  1,450 Units/hr Intravenous Continuous AFranky Macho RPH 14.5 mL/hr  at 05/01/17 0554 1,450 Units/hr at 05/01/17 0554  . levalbuterol (XOPENEX) nebulizer solution 0.63 mg  0.63 mg Nebulization Q6H PRN BCollene Gobble MD   0.63 mg at 04/28/17 0356  . levothyroxine (SYNTHROID, LEVOTHROID) tablet 50 mcg  50 mcg Oral QAC breakfast NJavier Glazier MD      . LORazepam (ATIVAN) tablet 2-3 mg  2-3 mg Oral Q1H PRN BCollene Gobble MD      . ondansetron ([facility] injection 4 mg  4 mg Intravenous Q8H PRN Deterding, EGuadelupe Sabin MD      . pantoprazole (PROTONIX) EC tablet 40 mg  40 mg Oral Daily JMartinique Peter M, MD   40 mg at 04/30/17 1004  . RESOURCE THICKENUP CLEAR   Oral PRN BCollene Gobble MD      . sodium chloride flush (NS) 0.9 % injection 3 mL  3 mL Intravenous Q12H Hilty, KNadean Corwin MD      . sodium chloride flush (NS) 0.9 % injection 3 mL  3 mL Intravenous PRN Hilty, KNadean Corwin MD      . thiamine (B-1) injection 100 mg  100 mg Intravenous Q24H SMarliss Coots PA-C   100 mg at 04/30/17 1004  . traMADol (ULTRAM) tablet 50 mg  50 mg Oral Q12H PRN NJavier Glazier MD   50 mg at 04/30/17 2151     Discharge Medications: Please see discharge summary for a list of discharge medications.  Relevant Imaging Results:  Relevant Lab Results:   Additional Information SSN: [id number] BEileen Stanford LCSW

## 2017-05-01 NOTE — Progress Notes (Signed)
OT Cancellation Note  Patient Details Name: TIGER SPIEKER MRN: [id number] DOB: 07/19/1941   Cancelled Treatment:    Reason Eval/Treat Not Completed: Patient at procedure or test/ unavailable. Pt off unit at cath lab. Will check back as able for OT evaluation.   Norman Herrlich, MS OTR/L  Pager: [phone]   Norman Herrlich 05/01/2017, 2:26 PM

## 2017-05-01 NOTE — Progress Notes (Signed)
SLP Cancellation Note  Patient Details Name: Justin Parsons MRN: [id number] DOB: 1941-07-22   Cancelled treatment:       Reason Eval/Treat Not Completed: Medical issues which prohibited therapy. Pt currently NPO pending cath lab - will f/u as able.   Germain Osgood 05/01/2017, 9:51 AM  Germain Osgood, M.A. CCC-SLP [phone]

## 2017-05-01 NOTE — Progress Notes (Signed)
ANTICOAGULATION CONSULT NOTE - Follow Up Consult  Pharmacy Consult for Heparin  Indication: chest pain/ACS  No Known Allergies  Patient Measurements: Height: 5' 8"  (172.7 cm) Weight: 282 lb 6.4 oz (128.1 kg) IBW/kg (Calculated) : 68.4  Heparin dosing wt: 99 kg  Vital Signs: Temp: 98.7 F (37.1 C) (06/13 1200) BP: 114/80 (06/13 1200) Pulse Rate: 73 (06/13 1200)  Labs:  Recent Labs  04/28/17 1409 04/28/17 1930  04/29/17 1038 04/30/17 0149 04/30/17 1547 05/01/17 0314 05/01/17 1142  HGB  --   --   < >  --  12.3* 12.3* 12.3*  --   HCT  --   --   < >  --  38.8* 39.4 39.3  --   PLT  --   --   < >  --  93* 115* 99*  --   HEPARINUNFRC  --   --   < > 0.40 0.35  --  0.23* 0.51  CREATININE  --   --   < > 3.25* 2.42*  --  2.42*  --   CKTOTAL  --   --   --  299  --   --   --   --   CKMB  --   --   --  12.8*  --   --   --   --   TROPONINI 40.36* 35.27*  --   --   --   --   --   --   < > = values in this interval not displayed.  Estimated Creatinine Clearance: 33.4 mL/min (A) (by C-G formula based on SCr of 2.42 mg/dL (H)).   Assessment: 21 yom admitted with possible stroke but neuro now saying pt without acute infarct. Pt on heparin gtt for NSTEMI. Heparin level down to subtherapeutic at 0.23. CBC stable. No issues with line or bleeding reported per RN.  Hx of afib deemed not a candidate for anticoagulation due to portal gastropathy, cirrhosis, and ongoing ETOH use.  Heparin level is now therapeutic at 0.51 on heparin 1450 units/hr. No issues with infusion or bleeding noted. Patient has cath scheduled for this afternoon.  Goal of Therapy:  Heparin level 0.3-0.7 units/ml Monitor platelets by anticoagulation protocol: Yes   Plan:  Continue heparin 1450 units/hr Daily HL/CBC Monitor s/sx of bleeding F/u after cath  Andrey Cota. Diona Foley, PharmD, [place] Clinical Pharmacist [phone] 05/01/2017,1:06 PM

## 2017-05-01 NOTE — Progress Notes (Signed)
PT Cancellation Note  Patient Details Name: Justin Parsons MRN: [id number] DOB: 1941-06-30   Cancelled Treatment:    Reason Eval/Treat Not Completed: Patient at procedure or test/unavailable Pt off floor at cath lab. Will follow up as time allows.   [name] A [name] 05/01/2017, 2:02 PM Wray Kearns, [place], DPT [phone]

## 2017-05-02 ENCOUNTER — Encounter (HOSPITAL_COMMUNITY): Payer: Self-pay | Admitting: Cardiology

## 2017-05-02 ENCOUNTER — Encounter (HOSPITAL_COMMUNITY): Payer: Medicare Other

## 2017-05-02 DIAGNOSIS — N179 Acute kidney failure, unspecified: Secondary | ICD-10-CM

## 2017-05-02 DIAGNOSIS — IMO0002 Reserved for concepts with insufficient information to code with codable children: Secondary | ICD-10-CM

## 2017-05-02 DIAGNOSIS — N183 Chronic kidney disease, stage 3 unspecified: Secondary | ICD-10-CM

## 2017-05-02 DIAGNOSIS — D696 Thrombocytopenia, unspecified: Secondary | ICD-10-CM

## 2017-05-02 DIAGNOSIS — Z720 Tobacco use: Secondary | ICD-10-CM

## 2017-05-02 LAB — CBC
HEMATOCRIT: 39.5 % (ref 39.0–52.0)
HEMOGLOBIN: 12 g/dL — AB (ref 13.0–17.0)
MCH: 28.6 pg (ref 26.0–34.0)
MCHC: 30.4 g/dL (ref 30.0–36.0)
MCV: 94 fL (ref 78.0–100.0)
Platelets: 104 10*3/uL — ABNORMAL LOW (ref 150–400)
RBC: 4.2 MIL/uL — AB (ref 4.22–5.81)
RDW: 17.3 % — ABNORMAL HIGH (ref 11.5–15.5)
WBC: 9.2 10*3/uL (ref 4.0–10.5)

## 2017-05-02 LAB — BASIC METABOLIC PANEL
Anion gap: 7 (ref 5–15)
BUN: 30 mg/dL — AB (ref 6–20)
CHLORIDE: 107 mmol/L (ref 101–111)
CO2: 23 mmol/L (ref 22–32)
CREATININE: 1.95 mg/dL — AB (ref 0.61–1.24)
Calcium: 8.7 mg/dL — ABNORMAL LOW (ref 8.9–10.3)
GFR calc Af Amer: 36 mL/min — ABNORMAL LOW (ref >60)
GFR calc non Af Amer: 31 mL/min — ABNORMAL LOW (ref >60)
GLUCOSE: 87 mg/dL (ref 65–99)
POTASSIUM: 5.2 mmol/L — AB (ref 3.5–5.1)
SODIUM: 137 mmol/L (ref 135–145)

## 2017-05-02 LAB — HEPARIN LEVEL (UNFRACTIONATED)
HEPARIN UNFRACTIONATED: 0.26 [IU]/mL — AB (ref 0.30–0.70)
HEPARIN UNFRACTIONATED: 0.47 [IU]/mL (ref 0.30–0.70)

## 2017-05-02 MED ORDER — SODIUM CHLORIDE 0.9 % IV SOLN
INTRAVENOUS | Status: DC
  Administered 2017-05-03: 06:00:00 via INTRAVENOUS

## 2017-05-02 MED ORDER — ASPIRIN EC 81 MG PO TBEC
81.0000 mg | DELAYED_RELEASE_TABLET | Freq: Every day | ORAL | Status: DC
  Administered 2017-05-04 – 2017-05-06 (×3): 81 mg via ORAL
  Filled 2017-05-02 (×3): qty 1

## 2017-05-02 MED ORDER — CLOPIDOGREL BISULFATE 75 MG PO TABS
75.0000 mg | ORAL_TABLET | Freq: Every day | ORAL | Status: DC
  Administered 2017-05-03 – 2017-05-06 (×4): 75 mg via ORAL
  Filled 2017-05-02 (×4): qty 1

## 2017-05-02 MED ORDER — SODIUM CHLORIDE 0.9 % IV SOLN: 250.0000 mL | INTRAVENOUS | Status: DC | PRN

## 2017-05-02 MED ORDER — CLOPIDOGREL BISULFATE 75 MG PO TABS
300.0000 mg | ORAL_TABLET | Freq: Once | ORAL | Status: AC
  Administered 2017-05-02: 300 mg via ORAL
  Filled 2017-05-02: qty 4

## 2017-05-02 MED ORDER — ASPIRIN 81 MG PO CHEW
81.0000 mg | CHEWABLE_TABLET | ORAL | Status: AC
  Administered 2017-05-03: 81 mg via ORAL
  Filled 2017-05-02: qty 1

## 2017-05-02 MED ORDER — SODIUM CHLORIDE 0.9% FLUSH: 3.0000 mL | INTRAVENOUS | Status: DC | PRN

## 2017-05-02 MED ORDER — SODIUM CHLORIDE 0.9% FLUSH
3.0000 mL | Freq: Two times a day (BID) | INTRAVENOUS | Status: DC
  Administered 2017-05-03: 3 mL via INTRAVENOUS

## 2017-05-02 NOTE — Progress Notes (Signed)
Interventional Note: Reviewed patient's chart in detail, reviewed cath films, discussed case with Dr Debara Pickett and Dr Ellyn Hack. Pt evaluated with his cousin (Medical POA) at bedside. I have reviewed Dr Everrett Coombe cardiac surgical consultation and the patient is not a candidate for CABG with numerous medical issues as outlined.   Plan atherectomy and stenting of the ostial LAD which will require stenting back into the left main and possibly bifurcational approach if the circumflex is compromised. The patient understands the risk of the procedure is significantly higher than a standard PCI because of his comorbid conditions and coronary anatomy. The RCA is unfavorable for PCI with heavy diffuse thrombus and I think best for medical therapy with DAPT. Post-PCI would consider DAPT With ASA and plavix rather than 'triple therapy' as his risk of bleeding in the setting of cirrhosis and portal HTN is excessive.   All questions are answered. Patient scheduled for PCI tomorrow. Orders written.  Sherren Mocha 05/02/2017 5:54 PM

## 2017-05-02 NOTE — Care Management Note (Addendum)
Case Management Note  Patient Details  Name: Justin Parsons MRN: [id number] Date of Birth: 03-Aug-1941  Subjective/Objective:   Pt admitted with acute encep                  Action/Plan:   PTA independent from home.  ETOH - will consult CSW   Expected Discharge Date:  04/30/17               Expected Discharge Plan:  Home/Self Care  In-House Referral:  Clinical Social Work  Discharge planning Services  CM Consult  Post Acute Care Choice:    Choice offered to:     DME Arranged:    DME Agency:     HH Arranged:    HH Agency:     Status of Service:     If discussed at [employer], dates discussed:    Additional Comments: SNF recommended - CSW consulted.  Pt remains on heparin drip and not candidate for CABG.   Maryclare Labrador, RN 05/02/2017, 9:30 AM

## 2017-05-02 NOTE — Progress Notes (Signed)
  Speech Language Pathology Treatment: Dysphagia  Patient Details Name: Justin Parsons MRN: [id number] DOB: Jan 21, 1941 Today's Date: 05/02/2017 Time: 9371-6967 SLP Time Calculation (min) (ACUTE ONLY): 11 min  Assessment / Plan / Recommendation Clinical Impression  Pt observed with thin liquids from breakfast tray - he remains on a clear liquid diet post-procedure. He had frequent eructation concerning for esophageal component but no overt s/s of aspiration with skilled observation from SLP. His oral phase appeared to occur swiftly with no obvious contraindication from advancing to regular textures again once medically appropriate. Will f/u briefly after diet advancement is made given concern for esophageal component.   HPI HPI: 76 y.o.malewith history of Afib- probably chronic, cirrhosis with portal gastropathy, CKD transferred [facility] with acute mental status changes, global aphasia, hypotension, and initially bradycardia. Per Neuro evaluation including CT and MRI this is less likely to be acute CVA. ? "shock brain" due to hypotension on arrival. Pt with history of esophageal dysphagia; EGD 02/07/15 revealed Schatzki's ring with proximal peptic stricture-status post [facility] dilation. Hiatal hernia. Portal gastropathy.      SLP Plan  Continue with current plan of care       Recommendations  Diet recommendations: Regular;Thin liquid Liquids provided via: Cup;Straw Medication Administration: Whole meds with liquid Supervision: Patient able to self feed;Intermittent supervision to cue for compensatory strategies Compensations: Slow rate;Small sips/bites;Minimize environmental distractions;Follow solids with liquid Postural Changes and/or Swallow Maneuvers: Seated upright 90 degrees;Upright 30-60 min after meal                Oral Care Recommendations: Oral care BID Follow up Recommendations: None SLP Visit Diagnosis: Dysphagia, unspecified (R13.10) Plan:  Continue with current plan of care       GO                Germain Osgood 05/02/2017, 9:11 AM  Germain Osgood, M.A. CCC-SLP [phone]

## 2017-05-02 NOTE — Progress Notes (Signed)
05/02/2017 1630 Received pt to room 2W36 from 4N.  Pt is A&O, some C/O of chronic shoulder pain.  Will give pain med when due.  Tele monitor applied and CCMD notified.  Oriented to room, call light and bed.  Call bell in reach, family at bedside. [place]

## 2017-05-02 NOTE — Progress Notes (Signed)
Physical Therapy Treatment Patient Details Name: Justin Parsons MRN: [id number] DOB: 10/25/1941 Today's Date: 05/02/2017    History of Present Illness Patient is a 76 y/o male who presents with a pre-syncopal episode after working out in his field today admitted with aphasia, bradycardia, encephalopathy and hypotension. Found to have NSTEMI. Reportedly drinks moonshine aka "creek water," suspect metabolic encephalopathy +/- withdrawal. Plan for cardiac cath 6/13. Head MRI, CT-unremarkable. PMH includes HTN, CKD, alcoholism, cirrhosis of liver, TIA.    PT Comments    Patient progressing well towards PT goals. Tolerated gait training with Min guard assist for safety. Pt's Sp02 dropped to 84% on 3L/min 02 and HR up to 125 bpm. 2/4 DOE. Pt weary to use RW due to it having wheels. Better able to stand from surfaces today.  Plan for PCI tomorrow. Will follow.  Follow Up Recommendations  SNF;Supervision for mobility/OOB     Equipment Recommendations  None recommended by PT    Recommendations for Other Services       Precautions / Restrictions Precautions Precautions: Fall Precaution Comments: watch vitals Restrictions Weight Bearing Restrictions: No    Mobility  Bed Mobility Overal bed mobility: Needs Assistance Bed Mobility: Supine to Sit     Supine to sit: Min assist;HOB elevated     General bed mobility comments: Up in chair upon PT arrival.   Transfers Overall transfer level: Needs assistance Equipment used: Rolling walker (2 wheeled) Transfers: Sit to/from Stand Sit to Stand: Min guard Stand pivot transfers: Min assist       General transfer comment: Min guard to steady in standing with cues for hand placment/technique.   Ambulation/Gait Ambulation/Gait assistance: Min guard Ambulation Distance (Feet): 12 Feet Assistive device: Rolling walker (2 wheeled) Gait Pattern/deviations: Step-to pattern;Step-through pattern;Trunk flexed;Decreased stride length Gait  velocity: decreased   General Gait Details: Slow, unsteady gait with RW for support. Pt scared of RW. SP02 dropped to 84% on 3L/min 02. Resolved within 2 mins. HR up to 125 bpm.    Stairs            Wheelchair Mobility    Modified Rankin (Stroke Patients Only)       Balance Overall balance assessment: Needs assistance Sitting-balance support: Feet supported;No upper extremity supported Sitting balance-Leahy Scale: Good     Standing balance support: During functional activity;Bilateral upper extremity supported Standing balance-Leahy Scale: Poor Standing balance comment: relies on RW                            Cognition Arousal/Alertness: Awake/alert Behavior During Therapy: WFL for tasks assessed/performed Overall Cognitive Status: Within Functional Limits for tasks assessed                                        Exercises      General Comments        Pertinent Vitals/Pain Pain Assessment: No/denies pain Faces Pain Scale: No hurt    Home Living Family/patient expects to be discharged to:: Private residence Living Arrangements: Alone Available Help at Discharge: Friend(s);Available PRN/intermittently Type of Home: House Home Access: Ramped entrance   Home Layout: One level Home Equipment: Walker - 2 wheels;Cane - single point;Shower seat - built in      Prior Function Level of Independence: Independent with assistive device(s)      Comments: uses SPC for ambulation. Likes to ride  his many tractors. Walks ~100 yards everyda. Makes his own moonshine.   PT Goals (current goals can now be found in the care plan section) Acute Rehab PT Goals Patient Stated Goal: to get stronger Progress towards PT goals: Progressing toward goals    Frequency    Min 4X/week      PT Plan Current plan remains appropriate    Co-evaluation              AM-PAC PT "6 Clicks" Daily Activity  Outcome Measure  Difficulty turning over  in bed (including adjusting bedclothes, sheets and blankets)?: None Difficulty moving from lying on back to sitting on the side of the bed? : Total Difficulty sitting down on and standing up from a chair with arms (e.g., wheelchair, bedside commode, etc,.)?: Total Help needed moving to and from a bed to chair (including a wheelchair)?: None Help needed walking in hospital room?: A Little Help needed climbing 3-5 steps with a railing? : A Lot 6 Click Score: 15    End of Session Equipment Utilized During Treatment: Gait belt Activity Tolerance: Patient tolerated treatment well;Treatment limited secondary to medical complications (Comment) (Drop in Sp02) Patient left: in chair;with call bell/phone within reach Nurse Communication: Mobility status PT Visit Diagnosis: Unsteadiness on feet (R26.81);Muscle weakness (generalized) (M62.81)     Time: 6599-3570 PT Time Calculation (min) (ACUTE ONLY): 22 min  Charges:  $Gait Training: 8-22 mins                    G Codes:       Wray Kearns, PT, DPT [phone]     [place] 05/02/2017, 11:57 AM

## 2017-05-02 NOTE — Progress Notes (Signed)
ANTICOAGULATION CONSULT NOTE - Follow Up Consult  Pharmacy Consult:  Heparin  Indication: chest pain/ACS  No Known Allergies  Patient Measurements: Height: 5' 8"  (172.7 cm) Weight: 283 lb 1.6 oz (128.4 kg) IBW/kg (Calculated) : 68.4  Heparin dosing wt: 99 kg  Vital Signs: Temp: 97.6 F (36.4 C) (06/14 1200) Temp Source: Oral (06/14 1200) BP: 106/70 (06/14 1200) Pulse Rate: 99 (06/14 1200)  Labs:  Recent Labs  04/30/17 0149 04/30/17 1547 05/01/17 0314 05/01/17 1142 05/02/17 0540 05/02/17 1227  HGB 12.3* 12.3* 12.3*  --  12.0*  --   HCT 38.8* 39.4 39.3  --  39.5  --   PLT 93* 115* 99*  --  104*  --   HEPARINUNFRC 0.35  --  0.23* 0.51 0.26* 0.47  CREATININE 2.42*  --  2.42*  --  1.95*  --     Estimated Creatinine Clearance: 41.5 mL/min (A) (by C-G formula based on SCr of 1.95 mg/dL (H)).   Assessment: 76 YOM admitted with possible CVA and was ruled out.  Patient was started on IV heparin for NSTEMI.  He has a history of Afib and was deemed not to be a good candidate for anticoagulation due to portal gastropathy, cirrhosis and ongoing EtOH use.  Now s/p cath and CVTS is consulted for possible CABG.  Heparin resumed 6 hours post sheath removal. Heparin level subtherapeutic on gtt at 1400 units/hr. No issues with line or bleeding reported per RN.  Repeat heparin level is now therapeutic at 0.47 on heparin 1600 units/hr. No issues with infusion or bleeding noted.  Goal of Therapy:  Heparin level 0.3-0.7 units/ml Monitor platelets by anticoagulation protocol: Yes   Plan:  Heparin 1600 units/hr Daily HL Monitor s/sx of bleeding  Andrey Cota. Diona Foley, PharmD, BCPS Clinical Pharmacist [phone] 05/02/2017, 1:34 PM

## 2017-05-02 NOTE — Evaluation (Signed)
Occupational Therapy Evaluation Patient Details Name: Justin Parsons MRN: [id number] DOB: 30-Mar-1941 Today's Date: 05/02/2017    History of Present Illness Patient is a 76 y/o male who presents with a pre-syncopal episode after working out in his field today admitted with aphasia, bradycardia, encephalopathy and hypotension. Found to have NSTEMI. Reportedly drinks moonshine aka "creek water," suspect metabolic encephalopathy +/- withdrawal. Plan for cardiac cath 6/13. Head MRI, CT-unremarkable. PMH includes HTN, CKD, alcoholism, cirrhosis of liver, TIA.   Clinical Impression   Pt reports he was independent with ADL PTA. Currently pt requires min assist overall for ADL and stand pivot transfers with the exception of max assist for LB ADL. SpO2 down to 83% on 2L supplemental O2; rebound to low-mid 90s with seated rest. HR up to 132 with activity. Recommending SNF for follow up to maximize independence and safety with ADL and functional mobility prior to return home alone. Pt would benefit from continued skilled OT to address established goals.    Follow Up Recommendations  SNF;Supervision/Assistance - 24 hour    Equipment Recommendations  Other (comment) (TBD at next venue)    Recommendations for Other Services       Precautions / Restrictions Precautions Precautions: Fall Precaution Comments: watch vitals Restrictions Weight Bearing Restrictions: No      Mobility Bed Mobility Overal bed mobility: Needs Assistance Bed Mobility: Supine to Sit     Supine to sit: Min assist;HOB elevated     General bed mobility comments: Assist for trunk elevation to sitting. Increased time, HOB elevated with use of bed rails.  Transfers Overall transfer level: Needs assistance Equipment used: Rolling walker (2 wheeled) Transfers: Sit to/from [facility] Sit to Stand: Min assist Stand pivot transfers: Min assist       General transfer comment: Min assist to boost up  from EOB with cues for hand placement. Min assist for stand pivot EOB to chair with increased time.    Balance Overall balance assessment: Needs assistance Sitting-balance support: Feet supported;No upper extremity supported Sitting balance-Leahy Scale: Good     Standing balance support: Bilateral upper extremity supported Standing balance-Leahy Scale: Poor Standing balance comment: relies on RW                           ADL either performed or assessed with clinical judgement   ADL Overall ADL's : Needs assistance/impaired Eating/Feeding: Set up;Sitting   Grooming: Minimal assistance;Sitting   Upper Body Bathing: Minimal assistance;Sitting   Lower Body Bathing: Maximal assistance;Sit to/from stand   Upper Body Dressing : Minimal assistance;Sitting   Lower Body Dressing: Maximal assistance;Sit to/from stand   Toilet Transfer: Minimal assistance;Stand-pivot;RW Toilet Transfer Details (indicate cue type and reason): Simulated by transfer EOB>chair. Pt able to void with urinal in standing; assist to hold urinal         Functional mobility during ADLs: Minimal assistance;Rolling walker (for stand pivot only) General ADL Comments: BP stable throughout, HR up to 132 with activity but mainly in low 100s, SpO2 down to 83% on 2L with activity-rebounded to mid-low 90s with seated rest and 1-2 minutes.      Vision Baseline Vision/History: Wears glasses (R eye blind) Wears Glasses: Reading only Patient Visual Report: No change from baseline       Perception     Praxis      Pertinent Vitals/Pain Pain Assessment: No/denies pain Faces Pain Scale: No hurt     Hand Dominance  Extremity/Trunk Assessment Upper Extremity Assessment Upper Extremity Assessment: Generalized weakness   Lower Extremity Assessment Lower Extremity Assessment: Defer to PT evaluation       Communication Communication Communication: No difficulties   Cognition Arousal/Alertness:  Awake/alert Behavior During Therapy: WFL for tasks assessed/performed Overall Cognitive Status: Within Functional Limits for tasks assessed                                     General Comments       Exercises     Shoulder Instructions      Home Living Family/patient expects to be discharged to:: Private residence Living Arrangements: Alone Available Help at Discharge: Friend(s);Available PRN/intermittently Type of Home: House Home Access: Ramped entrance     Home Layout: One level     Bathroom Shower/Tub: Occupational psychologist: Handicapped height     Home Equipment: Environmental consultant - 2 wheels;Cane - single point;Shower seat - built in          Prior Functioning/Environment Level of Independence: Independent with assistive device(s)        Comments: uses SPC for ambulation. 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 Walks ~100 yards everyda. Makes his own moonshine.        OT Problem List: Decreased strength;Decreased activity tolerance;Impaired balance (sitting and/or standing);Decreased knowledge of use of DME or AE;Cardiopulmonary status limiting activity;Obesity      OT Treatment/Interventions: Self-care/ADL training;Therapeutic exercise;Energy conservation;DME and/or AE instruction;Therapeutic activities;Patient/family education;Balance training    OT Goals(Current goals can be found in the care plan section) Acute Rehab OT Goals Patient Stated Goal: to get stronger OT Goal Formulation: With patient Time For Goal Achievement: 05/16/17 Potential to Achieve Goals: Good ADL Goals Pt Will Perform Grooming: with supervision;sitting;standing (x3 tasks) Pt Will Perform Upper Body Bathing: with set-up;sitting Pt Will Perform Lower Body Bathing: with min assist;sit to/from stand Pt Will Transfer to Toilet: with min assist;ambulating;bedside commode (over toilet)  OT Frequency: Min 2X/week   Barriers to D/C: Decreased caregiver support  pt lives alone        Co-evaluation              AM-PAC PT "6 Clicks" Daily Activity     Outcome Measure Help from another person eating meals?: None Help from another person taking care of personal grooming?: A Little Help from another person toileting, which includes using toliet, bedpan, or urinal?: A Little Help from another person bathing (including washing, rinsing, drying)?: A Lot Help from another person to put on and taking off regular upper body clothing?: A Little Help from another person to put on and taking off regular lower body clothing?: A Lot 6 Click Score: 17   End of Session Equipment Utilized During Treatment: Gait belt;Rolling walker;Oxygen Nurse Communication: Mobility status (pt voided 400cc)  Activity Tolerance: Patient tolerated treatment well Patient left: in chair;with call bell/phone within reach  OT Visit Diagnosis: Unsteadiness on feet (R26.81);Other abnormalities of gait and mobility (R26.89);Muscle weakness (generalized) (M62.81)                Time: 0762-2633 OT Time Calculation (min): 23 min Charges:  OT General Charges $OT Visit: 1 Procedure OT Evaluation $OT Eval Moderate Complexity: 1 Procedure OT Treatments $Self Care/Home Management : 8-22 mins G-Codes:     Mel Almond A. Ulice Brilliant, M.S., OTR/L Pager: [place] 05/02/2017, 11:33 AM

## 2017-05-02 NOTE — Progress Notes (Signed)
[facility] PROGRESS NOTE  Justin Parsons XLK:[id number] DOB: Nov 24, 1940 DOA: 04/27/2017 PCP: Monico Blitz, MD   Interim summary and history of present illness  76 year old man with a notable history of chronic kidney disease stage III, alcoholism, hypertension, hypothyroidism, gout and GERD; who was admitted secondary to presyncope while working outside. Patient was found with acute on chronic renal failure and circulatory shock suspected to be secondary to NSTEMI.   Assessment/Plan: 1-AMS/acute encephalopathy/aphasia -Most likely associated with shock brain due to hypotension and decreased perfusion; also considering associated to alcohol and the use of Neurontin in the presence of acute renal failure. -patient mentation resolved and currently AAOX3 -no evidence of stroke radiographically  -EEG w/o epileptic abnormalities  -VSS overall -will continue monitoring -was on precedex on admission, now off and stable overall.   2-NSTEMI -Cardiology on board -Status post left heart cath demonstrating multiple vessel disease; cardiothoracic surgery has been consulted to evaluate for CABG and determined high risk for surgery -cardiology planning PCI to major lesions, hopefully planned for 6/15 -will continue heparin drip  3-acute on chronic renal failure stage III at baseline: Creatinine in the range of 2.1 about 3 years ago -Cr 1.9 today -reports good urine output -no dysuria or hematuria  -will follow electrolytes -follow renal function trend -continue avoiding nephrotoxic agents and prevent hypotension   4-atrial fibrillation: unknown duration, but most likely chronic  -CHADsVASC score 3 -will need long term anticoagulation; most likely eliquis as best agent for him -rate controlled currently -will continue monitoring on telemetry   5-alcohol abuse and prior hx of liver cirrhosis with portal gastropathy  -cessation counseling provided -patient with normal LFT's and normal  INR -no active bleeding appreciated -Hgb and platelet count stable -will monitor   6-thrombocytopenia -no acute bleeding appreciated -platelets count remains stable -most likely due to hx of alcohol use    7-circulatory shock/hypotension -due to NSTEMI most likely -troponin > 40 -CP free currently -has remained hemodynamically stable -s/p left heart cath demonstrating multiple vessel disease; planning PCI tomorrow possibly 6/15 -continue supportive care  8-physical deconditioning  -per PT/OT rec's will need SNF at discharge -SW on board and waiting for medical stability   Code Status: Full code Family Communication: No family at bedside Disposition Plan: To be determined. Patient is status post left heart cath; found not a candidate for CABG; planning PCI intervention most likely on 6/15. Renal function stable/improving. Remains hemodynamically stable.    Consultants:  PCCM  Cardiology   Cardiothoracic surgery  Neurology   Procedures:  See below for x-ray reports  2-D echo - Left ventricle: Endocardium not well seen. The cavity size was normal. Wall thickness was increased in a pattern of severe LVH. Systolic function was normal. The estimated ejection fraction was in the range of 50% to 55%. The study is not technically sufficient to allow evaluation of LV diastolic function. - Aortic valve: Calcified non coronary cusp. - Mitral valve: Calcfied anterior leaflet Calcified annulus. There was mild regurgitation. - Left atrium: The atrium was moderately dilated. - Atrial septum: No defect or patent foramen ovale was identified. - Pulmonary arteries: PA peak pressure: 49 mm Hg (S).  Left heart cath: Multiple vessels are a stenosis and thrombotic, not a good candidate for PCI therapy. Cardiology has discussed with cardiothoracic surgeon for evaluation regarding CABG; not a good candidate for CABG.   Antibiotics:  None  HPI/Subjective: Patient overall  feeling better, no CP, no abd pain, no nausea and no vomiting. Denying dysuria.  Still with some tachypnea and requiring oxygen supplementation, especially with exertion. BP stable.   Objective: Vitals:   05/02/17 0554 05/02/17 0715  BP:  107/81  Pulse: 95 88  Resp: (!) 21 20  Temp:  97.6 F (36.4 C)    Intake/Output Summary (Last 24 hours) at 05/02/17 1225 Last data filed at 05/02/17 0700  Gross per 24 hour  Intake          1275.66 ml  Output             1125 ml  Net           150.66 ml   Filed Weights   04/30/17 0315 05/01/17 0500 05/02/17 0432  Weight: 130.3 kg (287 lb 4.2 oz) 128.1 kg (282 lb 6.4 oz) 128.4 kg (283 lb 1.6 oz)    Exam:   General: feeling better overall, AAOX3, no CP and reports some improvement in his breathing overall. Still tachypneic on exertion and requiring oxygen supplementation. No abd pain, no dysuria.   Cardiovascular: regular rate, no rubs, no gallops, no JVD appreciated.  Respiratory: good air movement bilaterally, no wheezing, no crackles appreciated.  Abdomen:  Obese, soft, NT, ND, positive BS   Musculoskeletal: trace edema bilaterally, no cyanosis or clubbing.    Data Reviewed: Basic Metabolic Panel:  Recent Labs Lab 04/28/17 0042 04/29/17 0043 04/29/17 1038 04/30/17 0149 05/01/17 0314 05/02/17 0540  NA 138 136 138 137 137 137  K 5.5* 4.7 5.0 4.5 4.5 5.2*  CL 110 104 107 108 107 107  CO2 20* 24 19* 22 22 23   GLUCOSE 105* 114* 88 82 118* 87  BUN 50* 49* 47* 40* 39* 30*  CREATININE 3.36* 3.41* 3.25* 2.42* 2.42* 1.95*  CALCIUM 8.0* 8.3* 8.5* 8.3* 8.2* 8.7*  MG 1.6* 1.9  --   --  1.9  --   PHOS 5.1* 4.6  --   --  3.3  --    Liver Function Tests:  Recent Labs Lab 05/01/17 0314  AST 34  ALT 20  ALKPHOS 59  BILITOT 1.1  PROT 5.9*  ALBUMIN 3.1*    Recent Labs Lab 04/27/17 2129  AMMONIA 18   CBC:  Recent Labs Lab 04/28/17 0042 04/29/17 0043 04/30/17 0149 04/30/17 1547 05/01/17 0314 05/02/17 0540  WBC 13.3*  16.9* 10.0 10.3 9.3 9.2  NEUTROABS 10.1*  --   --  8.1*  --   --   HGB 14.0 13.5 12.3* 12.3* 12.3* 12.0*  HCT 43.4 42.2 38.8* 39.4 39.3 39.5  MCV 91.6 91.1 91.7 92.7 92.9 94.0  PLT 110* 133* 93* 115* 99* 104*   Cardiac Enzymes:  Recent Labs Lab 04/28/17 0042 04/28/17 1409 04/28/17 1930 04/29/17 1038  CKTOTAL 935*  --   --  299  CKMB  --   --   --  12.8*  TROPONINI  --  40.36* 35.27*  --    CBG: No results for input(s): GLUCAP in the last 168 hours.  Recent Results (from the past 240 hour(s))  MRSA PCR Screening     Status: None   Collection Time: 04/27/17  6:38 PM  Result Value Ref Range Status   MRSA by PCR NEGATIVE NEGATIVE Final    Comment:        The GeneXpert MRSA Assay (FDA approved for NASAL specimens only), is one component of a comprehensive MRSA colonization surveillance program. It is not intended to diagnose MRSA infection nor to guide or monitor treatment for MRSA infections.  Studies: No results found.  Scheduled Meds: . aspirin EC  81 mg Oral Daily  . atorvastatin  80 mg Oral q1800  . folic acid  1 mg Oral Daily  . levothyroxine  50 mcg Oral QAC breakfast  . pantoprazole  40 mg Oral Daily  . sodium chloride flush  3 mL Intravenous Q12H  . thiamine injection  100 mg Intravenous Q24H   Continuous Infusions: . sodium chloride 10 mL/hr at 05/02/17 0300  . sodium chloride    . heparin 1,600 Units/hr (05/02/17 1886)      ARF (acute renal failure) (HCC)   CKD stage 3   A-fib (HCC)   Acute encephalopathy   Confusion   Non-ST elevation (NSTEMI) myocardial infarction [facility])   Shock circulatory ([place])    Time spent: 35 minutes   Barton Dubois  [facility] Pager [phone] If 7PM-7AM, please contact night-coverage at [email or web address], password [facility] 05/02/2017, 12:25 PM  LOS: 5 days

## 2017-05-02 NOTE — Progress Notes (Signed)
ANTICOAGULATION CONSULT NOTE - Follow Up Consult  Pharmacy Consult:  Heparin  Indication: chest pain/ACS  No Known Allergies  Patient Measurements: Height: 5' 8"  (172.7 cm) Weight: 283 lb 1.6 oz (128.4 kg) IBW/kg (Calculated) : 68.4  Heparin dosing wt: 99 kg  Vital Signs: Temp: 98.3 F (36.8 C) (06/14 0400) Temp Source: Oral (06/14 0400) BP: 122/79 (06/14 0400) Pulse Rate: 95 (06/14 0554)  Labs:  Recent Labs  04/29/17 1038  04/30/17 0149 04/30/17 1547 05/01/17 0314 05/01/17 1142 05/02/17 0540  HGB  --   < > 12.3* 12.3* 12.3*  --   --   HCT  --   --  38.8* 39.4 39.3  --   --   PLT  --   --  93* 115* 99*  --   --   HEPARINUNFRC 0.40  --  0.35  --  0.23* 0.51 0.26*  CREATININE 3.25*  --  2.42*  --  2.42*  --   --   CKTOTAL 299  --   --   --   --   --   --   CKMB 12.8*  --   --   --   --   --   --   < > = values in this interval not displayed.  Estimated Creatinine Clearance: 33.4 mL/min (A) (by C-G formula based on SCr of 2.42 mg/dL (H)).   Assessment: 1 YOM admitted with possible CVA and was ruled out.  Patient was started on IV heparin for NSTEMI.  He has a history of Afib and was deemed not to be a good candidate for anticoagulation due to portal gastropathy, cirrhosis and ongoing EtOH use.  Now s/p cath and CVTS is consulted for possible CABG.  Heparin resumed 6 hours post sheath removal. Heparin level subtherapeutic on gtt at 1400 units/hr. No issues with line or bleeding reported per RN.  Goal of Therapy:  Heparin level 0.3-0.7 units/ml Monitor platelets by anticoagulation protocol: Yes   Plan:  Increase heparin gtt to 1600 units/hr Will f/u 8 hr heparin level  Sherlon Handing, PharmD, BCPS Clinical pharmacist, pager [phone] 05/02/2017, 6:33 AM

## 2017-05-02 NOTE — Progress Notes (Signed)
DAILY PROGRESS NOTE   Patient Name: Justin Parsons Date of Encounter: 05/02/2017  Hospital Problem List   Active Problems:   ARF (acute renal failure) (HCC)   A-fib (HCC)   Acute diastolic CHF (congestive heart failure) ([place])   Acute encephalopathy   Confusion   Non-ST elevation (NSTEMI) myocardial infarction [facility])   Delirium tremens ([place])   Shock circulatory (HCC)    Chief Complaint   No chest pain  Subjective   Cath yesterday showed severe proximal LAD stenosis as well as thrombus/stenosis of the RCA, occluded diagonal branch. CT surgery consulted -felt he was too high risk for surgery given CKD, cirrhosis and portal gastropathy. PCI was recommended.  Objective   Vitals:   05/02/17 0400 05/02/17 0432 05/02/17 0554 05/02/17 0715  BP: 122/79   107/81  Pulse: 98  95 88  Resp: 19  (!) 21 20  Temp: 98.3 F (36.8 C)   97.6 F (36.4 C)  TempSrc: Oral   Oral  SpO2: 96%  98% 96%  Weight:  283 lb 1.6 oz (128.4 kg)    Height:        Intake/Output Summary (Last 24 hours) at 05/02/17 1034 Last data filed at 05/02/17 0700  Gross per 24 hour  Intake          1444.23 ml  Output             1525 ml  Net           -80.77 ml   Filed Weights   04/30/17 0315 05/01/17 0500 05/02/17 0432  Weight: 287 lb 4.2 oz (130.3 kg) 282 lb 6.4 oz (128.1 kg) 283 lb 1.6 oz (128.4 kg)    Physical Exam   General appearance: alert, appears older than stated age and no distress Neck: no carotid bruit, no JVD and thyroid not enlarged, symmetric, no tenderness/mass/nodules Lungs: clear to auscultation bilaterally Heart: irregularly irregular rhythm Abdomen: soft, non-tender; bowel sounds normal; no masses,  no organomegaly Extremities: extremities normal, atraumatic, no cyanosis or edema Pulses: 2+ and symmetric Skin: Skin color, texture, turgor normal. No rashes or lesions Neurologic: Mental status: Alert, oriented, thought content appropriate Psych: Pleasant, clearer  today  Inpatient Medications    Scheduled Meds: . aspirin EC  81 mg Oral Daily  . atorvastatin  80 mg Oral q1800  . folic acid  1 mg Oral Daily  . levothyroxine  50 mcg Oral QAC breakfast  . pantoprazole  40 mg Oral Daily  . sodium chloride flush  3 mL Intravenous Q12H  . thiamine injection  100 mg Intravenous Q24H    Continuous Infusions: . sodium chloride 10 mL/hr at 05/02/17 0300  . sodium chloride    . heparin 1,600 Units/hr (05/02/17 0643)    PRN Meds: sodium chloride, gi cocktail, levalbuterol, LORazepam, ondansetron, RESOURCE THICKENUP CLEAR, sodium chloride flush, traMADol   Labs   Results for orders placed or performed during the hospital encounter of 04/27/17 (from the past 48 hour(s))  CBC with Differential/Platelet     Status: Abnormal   Collection Time: 04/30/17  3:47 PM  Result Value Ref Range   WBC 10.3 4.0 - 10.5 K/uL    Comment: WHITE COUNT CONFIRMED ON SMEAR   RBC 4.25 4.22 - 5.81 MIL/uL   Hemoglobin 12.3 (L) 13.0 - 17.0 g/dL   HCT 39.4 39.0 - 52.0 %   MCV 92.7 78.0 - 100.0 fL   MCH 28.9 26.0 - 34.0 pg   MCHC 31.2 30.0 -  36.0 g/dL   RDW 17.1 (H) 11.5 - 15.5 %   Platelets 115 (L) 150 - 400 K/uL    Comment: CONSISTENT WITH PREVIOUS RESULT   Neutrophils Relative % 78 %   Lymphocytes Relative 12 %   Monocytes Relative 8 %   Eosinophils Relative 2 %   Basophils Relative 0 %   Neutro Abs 8.1 (H) 1.7 - 7.7 K/uL   Lymphs Abs 1.2 0.7 - 4.0 K/uL   Monocytes Absolute 0.8 0.1 - 1.0 K/uL   Eosinophils Absolute 0.2 0.0 - 0.7 K/uL   Basophils Absolute 0.0 0.0 - 0.1 K/uL   Smear Review MORPHOLOGY UNREMARKABLE   Heparin level (unfractionated)     Status: Abnormal   Collection Time: 05/01/17  3:14 AM  Result Value Ref Range   Heparin Unfractionated 0.23 (L) 0.30 - 0.70 IU/mL    Comment:        IF HEPARIN RESULTS ARE BELOW EXPECTED VALUES, AND PATIENT DOSAGE HAS BEEN CONFIRMED, SUGGEST FOLLOW UP TESTING OF ANTITHROMBIN III LEVELS.   CBC     Status:  Abnormal   Collection Time: 05/01/17  3:14 AM  Result Value Ref Range   WBC 9.3 4.0 - 10.5 K/uL   RBC 4.23 4.22 - 5.81 MIL/uL   Hemoglobin 12.3 (L) 13.0 - 17.0 g/dL   HCT 39.3 39.0 - 52.0 %   MCV 92.9 78.0 - 100.0 fL   MCH 29.1 26.0 - 34.0 pg   MCHC 31.3 30.0 - 36.0 g/dL   RDW 17.1 (H) 11.5 - 15.5 %   Platelets 99 (L) 150 - 400 K/uL    Comment: CONSISTENT WITH PREVIOUS RESULT  Comprehensive metabolic panel     Status: Abnormal   Collection Time: 05/01/17  3:14 AM  Result Value Ref Range   Sodium 137 135 - 145 mmol/L   Potassium 4.5 3.5 - 5.1 mmol/L   Chloride 107 101 - 111 mmol/L   CO2 22 22 - 32 mmol/L   Glucose, Bld 118 (H) 65 - 99 mg/dL   BUN 39 (H) 6 - 20 mg/dL   Creatinine, Ser 2.42 (H) 0.61 - 1.24 mg/dL   Calcium 8.2 (L) 8.9 - 10.3 mg/dL   Total Protein 5.9 (L) 6.5 - 8.1 g/dL   Albumin 3.1 (L) 3.5 - 5.0 g/dL   AST 34 15 - 41 U/L   ALT 20 17 - 63 U/L   Alkaline Phosphatase 59 38 - 126 U/L   Total Bilirubin 1.1 0.3 - 1.2 mg/dL   GFR calc non Af Amer 24 (L) >60 mL/min   GFR calc Af Amer 28 (L) >60 mL/min    Comment: (NOTE) The eGFR has been calculated using the CKD EPI equation. This calculation has not been validated in all clinical situations. eGFR's persistently <60 mL/min signify possible Chronic Kidney Disease.    Anion gap 8 5 - 15  Magnesium     Status: None   Collection Time: 05/01/17  3:14 AM  Result Value Ref Range   Magnesium 1.9 1.7 - 2.4 mg/dL  Phosphorus     Status: None   Collection Time: 05/01/17  3:14 AM  Result Value Ref Range   Phosphorus 3.3 2.5 - 4.6 mg/dL  Heparin level (unfractionated)     Status: None   Collection Time: 05/01/17 11:42 AM  Result Value Ref Range   Heparin Unfractionated 0.51 0.30 - 0.70 IU/mL    Comment:        IF HEPARIN RESULTS ARE BELOW EXPECTED VALUES,  AND PATIENT DOSAGE HAS BEEN CONFIRMED, SUGGEST FOLLOW UP TESTING OF ANTITHROMBIN III LEVELS.   Heparin level (unfractionated)     Status: Abnormal   Collection  Time: 05/02/17  5:40 AM  Result Value Ref Range   Heparin Unfractionated 0.26 (L) 0.30 - 0.70 IU/mL    Comment:        IF HEPARIN RESULTS ARE BELOW EXPECTED VALUES, AND PATIENT DOSAGE HAS BEEN CONFIRMED, SUGGEST FOLLOW UP TESTING OF ANTITHROMBIN III LEVELS.   CBC     Status: Abnormal   Collection Time: 05/02/17  5:40 AM  Result Value Ref Range   WBC 9.2 4.0 - 10.5 K/uL   RBC 4.20 (L) 4.22 - 5.81 MIL/uL   Hemoglobin 12.0 (L) 13.0 - 17.0 g/dL   HCT 39.5 39.0 - 52.0 %   MCV 94.0 78.0 - 100.0 fL   MCH 28.6 26.0 - 34.0 pg   MCHC 30.4 30.0 - 36.0 g/dL   RDW 17.3 (H) 11.5 - 15.5 %   Platelets 104 (L) 150 - 400 K/uL    Comment: REPEATED TO VERIFY CONSISTENT WITH PREVIOUS RESULT   Basic metabolic panel     Status: Abnormal   Collection Time: 05/02/17  5:40 AM  Result Value Ref Range   Sodium 137 135 - 145 mmol/L   Potassium 5.2 (H) 3.5 - 5.1 mmol/L   Chloride 107 101 - 111 mmol/L   CO2 23 22 - 32 mmol/L   Glucose, Bld 87 65 - 99 mg/dL   BUN 30 (H) 6 - 20 mg/dL   Creatinine, Ser 1.95 (H) 0.61 - 1.24 mg/dL   Calcium 8.7 (L) 8.9 - 10.3 mg/dL   GFR calc non Af Amer 31 (L) >60 mL/min   GFR calc Af Amer 36 (L) >60 mL/min    Comment: (NOTE) The eGFR has been calculated using the CKD EPI equation. This calculation has not been validated in all clinical situations. eGFR's persistently <60 mL/min signify possible Chronic Kidney Disease.    Anion gap 7 5 - 15    ECG   None today  Telemetry   A-fib with CVR - Personally Reviewed  Radiology    No results found.  Cardiac Studies   LV EF: 50% -   55%  ------------------------------------------------------------------- Indications:      Atrial fibrillation - 427.31.  ------------------------------------------------------------------- History:   Risk factors:  Chronic kidney disease. Cirrhosis.  ------------------------------------------------------------------- Study Conclusions  - Left ventricle: Endocardium not  well seen. The cavity size was   normal. Wall thickness was increased in a pattern of severe LVH.   Systolic function was normal. The estimated ejection fraction was   in the range of 50% to 55%. The study is not technically   sufficient to allow evaluation of LV diastolic function. - Aortic valve: Calcified non coronary cusp. - Mitral valve: Calcfied anterior leaflet Calcified annulus. There   was mild regurgitation. - Left atrium: The atrium was moderately dilated. - Atrial septum: No defect or patent foramen ovale was identified. - Pulmonary arteries: PA peak pressure: 49 mm Hg (S).  Assessment   Active Problems:   ARF (acute renal failure) (HCC)   A-fib (HCC)   Acute diastolic CHF (congestive heart failure) (HCC)   Acute encephalopathy   Confusion   Non-ST elevation (NSTEMI) myocardial infarction [facility])   Delirium tremens (HCC)   Shock circulatory ([place])   Plan   1. 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 Will  d/w interventional colleagues about PCI options today - possibly schedule for tomorrow. 2. Hypotension - resolved 3. Cirrhosis with portal gastropathy - may not be a good long-term anticoagulation candidate due to this. 4. A-fib- may be chronic, rate-controlled at the moment. On IV heparin. CHADSVASC score of at least 3. Consider long-term anticoagulation when we determine cause of NSTEMI. 5. AKI on CKD - baseline creatinine in 2014 was 2.1 ( now 2.4, improved from up to 3.6) - received lasix, but net positive. LVEF low normal at 50-55%. Creatinine improved further to 1.95 today - potassium is elevated at 5.2. 6. Encephalopathy - likely Wernicke's - resolved. 7. Thrombocytopenia - agree this may be related to liver disease - improved somewhat.  Time Spent Directly with Patient:  25 minutes  Length of Stay:  LOS: 5 days   Pixie Casino, MD, [place]  Attending Cardiologist  Direct Dial:  [phone]  Fax: [phone]  Website:  [email or web address] 05/02/2017, 10:34 AM

## 2017-05-03 ENCOUNTER — Encounter (HOSPITAL_COMMUNITY)
Admission: AD | Disposition: A | Discharge status: Skilled Nursing Facility | Payer: Self-pay | Source: Other Acute Inpatient Hospital | Attending: Internal Medicine

## 2017-05-03 DIAGNOSIS — M1A09X0 Idiopathic chronic gout, multiple sites, without tophus (tophi): Secondary | ICD-10-CM

## 2017-05-03 DIAGNOSIS — I25110 Atherosclerotic heart disease of native coronary artery with unstable angina pectoris: Secondary | ICD-10-CM

## 2017-05-03 DIAGNOSIS — F1099 Alcohol use, unspecified with unspecified alcohol-induced disorder: Secondary | ICD-10-CM

## 2017-05-03 DIAGNOSIS — I25119 Atherosclerotic heart disease of native coronary artery with unspecified angina pectoris: Secondary | ICD-10-CM

## 2017-05-03 HISTORY — PX: CORONARY STENT INTERVENTION: CATH118234

## 2017-05-03 HISTORY — PX: CORONARY ATHERECTOMY: CATH118238

## 2017-05-03 LAB — BASIC METABOLIC PANEL
Anion gap: 7 (ref 5–15)
BUN: 26 mg/dL — AB (ref 6–20)
CO2: 22 mmol/L (ref 22–32)
Calcium: 8.8 mg/dL — ABNORMAL LOW (ref 8.9–10.3)
Chloride: 107 mmol/L (ref 101–111)
Creatinine, Ser: 1.88 mg/dL — ABNORMAL HIGH (ref 0.61–1.24)
GFR calc Af Amer: 38 mL/min — ABNORMAL LOW (ref >60)
GFR, EST NON AFRICAN AMERICAN: 33 mL/min — AB (ref >60)
Glucose, Bld: 82 mg/dL (ref 65–99)
POTASSIUM: 4.9 mmol/L (ref 3.5–5.1)
SODIUM: 136 mmol/L (ref 135–145)

## 2017-05-03 LAB — CBC
HCT: 39.4 % (ref 39.0–52.0)
Hemoglobin: 12.2 g/dL — ABNORMAL LOW (ref 13.0–17.0)
MCH: 29 pg (ref 26.0–34.0)
MCHC: 31 g/dL (ref 30.0–36.0)
MCV: 93.8 fL (ref 78.0–100.0)
PLATELETS: 111 10*3/uL — AB (ref 150–400)
RBC: 4.2 MIL/uL — AB (ref 4.22–5.81)
RDW: 17.1 % — ABNORMAL HIGH (ref 11.5–15.5)
WBC: 8.5 10*3/uL (ref 4.0–10.5)

## 2017-05-03 LAB — PROTIME-INR
INR: 1.2
PROTHROMBIN TIME: 15.3 s — AB (ref 11.4–15.2)

## 2017-05-03 LAB — HEPARIN LEVEL (UNFRACTIONATED): HEPARIN UNFRACTIONATED: 0.43 [IU]/mL (ref 0.30–0.70)

## 2017-05-03 SURGERY — CORONARY ATHERECTOMY
Anesthesia: LOCAL

## 2017-05-03 MED ORDER — TRAMADOL HCL 50 MG PO TABS
50.0000 mg | ORAL_TABLET | Freq: Once | ORAL | Status: AC
  Administered 2017-05-03: 50 mg via ORAL

## 2017-05-03 MED ORDER — ALLOPURINOL 100 MG PO TABS
100.0000 mg | ORAL_TABLET | Freq: Every day | ORAL | Status: DC
  Administered 2017-05-03 – 2017-05-06 (×4): 100 mg via ORAL
  Filled 2017-05-03 (×4): qty 1

## 2017-05-03 MED ORDER — NITROGLYCERIN 1 MG/10 ML FOR IR/CATH LAB
INTRA_ARTERIAL | Status: DC | PRN
  Administered 2017-05-03: 200 ug via INTRACORONARY

## 2017-05-03 MED ORDER — SODIUM CHLORIDE 0.9 % IV SOLN: 250.0000 mL | INTRAVENOUS | Status: DC | PRN

## 2017-05-03 MED ORDER — VERAPAMIL HCL 2.5 MG/ML IV SOLN
INTRAVENOUS | Status: AC
  Filled 2017-05-03: qty 2

## 2017-05-03 MED ORDER — HEPARIN SODIUM (PORCINE) 1000 UNIT/ML IJ SOLN
INTRAMUSCULAR | Status: DC | PRN
  Administered 2017-05-03: 10000 [IU] via INTRAVENOUS

## 2017-05-03 MED ORDER — FUROSEMIDE 10 MG/ML IJ SOLN
80.0000 mg | Freq: Once | INTRAMUSCULAR | Status: AC
  Administered 2017-05-03: 20:00:00 80 mg via INTRAVENOUS
  Filled 2017-05-03: qty 8

## 2017-05-03 MED ORDER — METOPROLOL TARTRATE 5 MG/5ML IV SOLN
INTRAVENOUS | Status: AC
  Filled 2017-05-03: qty 5

## 2017-05-03 MED ORDER — SODIUM CHLORIDE 0.9 % IV SOLN: INTRAVENOUS | Status: AC

## 2017-05-03 MED ORDER — HEPARIN (PORCINE) IN NACL 2-0.9 UNIT/ML-% IJ SOLN
INTRAMUSCULAR | Status: AC
  Filled 2017-05-03: qty 1000

## 2017-05-03 MED ORDER — NITROGLYCERIN 1 MG/10 ML FOR IR/CATH LAB
INTRA_ARTERIAL | Status: AC
  Filled 2017-05-03: qty 10

## 2017-05-03 MED ORDER — SODIUM CHLORIDE 0.9% FLUSH
3.0000 mL | Freq: Two times a day (BID) | INTRAVENOUS | Status: DC
  Administered 2017-05-03 – 2017-05-06 (×2): 3 mL via INTRAVENOUS

## 2017-05-03 MED ORDER — LIDOCAINE HCL (PF) 1 % IJ SOLN
INTRAMUSCULAR | Status: DC | PRN
  Administered 2017-05-03: 2 mL

## 2017-05-03 MED ORDER — PREDNISONE 20 MG PO TABS
20.0000 mg | ORAL_TABLET | Freq: Every day | ORAL | Status: DC
  Administered 2017-05-04 – 2017-05-06 (×3): 20 mg via ORAL
  Filled 2017-05-03 (×3): qty 1

## 2017-05-03 MED ORDER — LORAZEPAM 0.5 MG PO TABS: 2.0000 mg | ORAL_TABLET | ORAL | Status: DC | PRN

## 2017-05-03 MED ORDER — VIPERSLIDE LUBRICANT OPTIME
TOPICAL | Status: DC | PRN
  Administered 2017-05-03: 14:00:00 via SURGICAL_CAVITY

## 2017-05-03 MED ORDER — HEPARIN SODIUM (PORCINE) 1000 UNIT/ML IJ SOLN
INTRAMUSCULAR | Status: AC
  Filled 2017-05-03: qty 1

## 2017-05-03 MED ORDER — IOPAMIDOL (ISOVUE-370) INJECTION 76%
INTRAVENOUS | Status: DC | PRN
  Administered 2017-05-03: 250 mL via INTRA_ARTERIAL

## 2017-05-03 MED ORDER — SODIUM CHLORIDE 0.9% FLUSH: 3.0000 mL | INTRAVENOUS | Status: DC | PRN

## 2017-05-03 MED ORDER — CARVEDILOL 3.125 MG PO TABS
3.1250 mg | ORAL_TABLET | Freq: Two times a day (BID) | ORAL | Status: DC
  Administered 2017-05-03 – 2017-05-06 (×8): 3.125 mg via ORAL
  Filled 2017-05-03 (×8): qty 1

## 2017-05-03 MED ORDER — LIDOCAINE HCL (PF) 1 % IJ SOLN
INTRAMUSCULAR | Status: AC
  Filled 2017-05-03: qty 30

## 2017-05-03 MED ORDER — METOPROLOL TARTRATE 5 MG/5ML IV SOLN
INTRAVENOUS | Status: DC | PRN
  Administered 2017-05-03: 5 mg via INTRAVENOUS

## 2017-05-03 MED ORDER — HEPARIN (PORCINE) IN NACL 2-0.9 UNIT/ML-% IJ SOLN
INTRAMUSCULAR | Status: AC | PRN
  Administered 2017-05-03: 1500 mL

## 2017-05-03 MED ORDER — VERAPAMIL HCL 2.5 MG/ML IV SOLN
INTRAVENOUS | Status: DC | PRN
  Administered 2017-05-03: 10 mL via INTRA_ARTERIAL

## 2017-05-03 SURGICAL SUPPLY — 25 items
BALLN SAPPHIRE 3.5X15 (BALLOONS) ×2
BALLN ~~LOC~~ EUPHORA RX 4.5X8 (BALLOONS) ×2
BALLN ~~LOC~~ EUPHORA RX 5.0X8 (BALLOONS) ×2
BALLOON SAPPHIRE 3.5X15 (BALLOONS) IMPLANT
BALLOON ~~LOC~~ EUPHORA RX 4.5X8 (BALLOONS) IMPLANT
BALLOON ~~LOC~~ EUPHORA RX 5.0X8 (BALLOONS) IMPLANT
CATH INFINITI JR4 5F (CATHETERS) ×1 IMPLANT
CATH LAUNCHER 6FR EBU3.5 (CATHETERS) ×1 IMPLANT
CROWN DIAMONDBACK CLASSIC 1.25 (BURR) ×1 IMPLANT
DEVICE RAD COMP TR BAND LRG (VASCULAR PRODUCTS) ×1 IMPLANT
ELECT DEFIB PAD ADLT CADENCE (PAD) ×1 IMPLANT
GLIDESHEATH SLEND A-KIT 6F 22G (SHEATH) ×1 IMPLANT
GUIDEWIRE INQWIRE 1.5J.035X260 (WIRE) IMPLANT
HOVERMATT SINGLE USE (MISCELLANEOUS) ×1 IMPLANT
INQWIRE 1.5J .035X260CM (WIRE) ×2
KIT ENCORE 26 ADVANTAGE (KITS) ×1 IMPLANT
KIT HEART LEFT (KITS) ×2 IMPLANT
LUBRICANT VIPERSLIDE CORONARY (MISCELLANEOUS) ×1 IMPLANT
PACK CARDIAC CATHETERIZATION (CUSTOM PROCEDURE TRAY) ×2 IMPLANT
STENT SYNERGY DES 4X12 (Permanent Stent) ×1 IMPLANT
TRANSDUCER W/STOPCOCK (MISCELLANEOUS) ×2 IMPLANT
TUBING CIL FLEX 10 FLL-RA (TUBING) ×2 IMPLANT
WIRE ASAHI PROWATER 180CM (WIRE) ×1 IMPLANT
WIRE HI TORQ BMW 190CM (WIRE) ×1 IMPLANT
WIRE VIPER ADVANCE COR .012TIP (WIRE) ×1 IMPLANT

## 2017-05-03 NOTE — Progress Notes (Signed)
 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 Plan for PCI of the LM/LAD today.   Objective   Vitals:   05/02/17 1400 05/02/17 1630 05/02/17 2115 05/03/17 0329  BP: 113/72 117/64 110/74 137/68  Pulse: 89 88 82 84  Resp:   18 18  Temp:  98 F (36.7 C) 97.7 F (36.5 C) 97.9 F (36.6 C)  TempSrc:  Oral Oral Oral  SpO2: 97% 98% 94% 96%  Weight:    283 lb 6.4 oz (128.5 kg)  Height:        Intake/Output Summary (Last 24 hours) at 05/03/17 1122 Last data filed at 05/03/17 0635  Gross per 24 hour  Intake           314.58 ml  Output             1275 ml  Net          -960.42 ml   Filed Weights   05/01/17 0500 05/02/17 0432 05/03/17 0329  Weight: 282 lb 6.4 oz (128.1 kg) 283 lb 1.6 oz (128.4 kg) 283 lb 6.4 oz (128.5 kg)    Physical Exam   General appearance: alert and no distress Lungs: clear to auscultation bilaterally Heart: regular rate and rhythm Extremities: extremities normal, atraumatic, no cyanosis or edema Neurologic: Grossly normal  Inpatient Medications    Scheduled Meds: . allopurinol  100 mg Oral Daily  . [START ON 05/04/2017] aspirin EC  81 mg Oral Daily  . atorvastatin  80 mg Oral q1800  . clopidogrel  75 mg Oral Daily  . folic acid  1 mg Oral Daily  . levothyroxine  50 mcg Oral QAC breakfast  . pantoprazole  40 mg Oral Daily  . [START ON 05/04/2017] predniSONE  20 mg Oral Q breakfast  . sodium  chloride flush  3 mL Intravenous Q12H  . sodium chloride flush  3 mL Intravenous Q12H  . thiamine injection  100 mg Intravenous Q24H    Continuous Infusions: . sodium chloride 10 mL/hr at 05/02/17 0300  . sodium chloride    . sodium chloride    . sodium chloride 75 mL/hr at 05/03/17 0604  . heparin 1,600 Units/hr (05/02/17 0643)    PRN Meds: sodium chloride, sodium chloride, gi cocktail, levalbuterol, LORazepam, ondansetron, RESOURCE THICKENUP CLEAR, sodium chloride flush, sodium chloride flush, traMADol   Labs   Results for orders placed or performed during the hospital encounter of 04/27/17 (from the past 48 hour(s))  Heparin level (unfractionated)     Status: None   Collection Time: 05/01/17 11:42 AM  Result Value Ref Range   Heparin Unfractionated 0.51 0.30 - 0.70 IU/mL    Comment:        IF HEPARIN RESULTS ARE BELOW EXPECTED VALUES, AND PATIENT DOSAGE HAS BEEN CONFIRMED, SUGGEST FOLLOW UP TESTING OF ANTITHROMBIN III LEVELS.   Heparin level (unfractionated)     Status: Abnormal   Collection   Time: 05/02/17  5:40 AM  Result Value Ref Range   Heparin Unfractionated 0.26 (L) 0.30 - 0.70 IU/mL    Comment:        IF HEPARIN RESULTS ARE BELOW EXPECTED VALUES, AND PATIENT DOSAGE HAS BEEN CONFIRMED, SUGGEST FOLLOW UP TESTING OF ANTITHROMBIN III LEVELS.   CBC     Status: Abnormal   Collection Time: 05/02/17  5:40 AM  Result Value Ref Range   WBC 9.2 4.0 - 10.5 K/uL   RBC 4.20 (L) 4.22 - 5.81 MIL/uL   Hemoglobin 12.0 (L) 13.0 - 17.0 g/dL   HCT 39.5 39.0 - 52.0 %   MCV 94.0 78.0 - 100.0 fL   MCH 28.6 26.0 - 34.0 pg   MCHC 30.4 30.0 - 36.0 g/dL   RDW 17.3 (H) 11.5 - 15.5 %   Platelets 104 (L) 150 - 400 K/uL    Comment: REPEATED TO VERIFY CONSISTENT WITH PREVIOUS RESULT   Basic metabolic panel     Status: Abnormal   Collection Time: 05/02/17  5:40 AM  Result Value Ref Range   Sodium 137 135 - 145 mmol/L   Potassium 5.2 (H) 3.5 - 5.1 mmol/L   Chloride 107 101 - 111  mmol/L   CO2 23 22 - 32 mmol/L   Glucose, Bld 87 65 - 99 mg/dL   BUN 30 (H) 6 - 20 mg/dL   Creatinine, Ser 1.95 (H) 0.61 - 1.24 mg/dL   Calcium 8.7 (L) 8.9 - 10.3 mg/dL   GFR calc non Af Amer 31 (L) >60 mL/min   GFR calc Af Amer 36 (L) >60 mL/min    Comment: (NOTE) The eGFR has been calculated using the CKD EPI equation. This calculation has not been validated in all clinical situations. eGFR's persistently <60 mL/min signify possible Chronic Kidney Disease.    Anion gap 7 5 - 15  Heparin level (unfractionated)     Status: None   Collection Time: 05/02/17 12:27 PM  Result Value Ref Range   Heparin Unfractionated 0.47 0.30 - 0.70 IU/mL    Comment:        IF HEPARIN RESULTS ARE BELOW EXPECTED VALUES, AND PATIENT DOSAGE HAS BEEN CONFIRMED, SUGGEST FOLLOW UP TESTING OF ANTITHROMBIN III LEVELS.   Heparin level (unfractionated)     Status: None   Collection Time: 05/03/17  3:35 AM  Result Value Ref Range   Heparin Unfractionated 0.43 0.30 - 0.70 IU/mL    Comment:        IF HEPARIN RESULTS ARE BELOW EXPECTED VALUES, AND PATIENT DOSAGE HAS BEEN CONFIRMED, SUGGEST FOLLOW UP TESTING OF ANTITHROMBIN III LEVELS.   CBC     Status: Abnormal   Collection Time: 05/03/17  3:35 AM  Result Value Ref Range   WBC 8.5 4.0 - 10.5 K/uL   RBC 4.20 (L) 4.22 - 5.81 MIL/uL   Hemoglobin 12.2 (L) 13.0 - 17.0 g/dL   HCT 39.4 39.0 - 52.0 %   MCV 93.8 78.0 - 100.0 fL   MCH 29.0 26.0 - 34.0 pg   MCHC 31.0 30.0 - 36.0 g/dL   RDW 17.1 (H) 11.5 - 15.5 %   Platelets 111 (L) 150 - 400 K/uL    Comment: CONSISTENT WITH PREVIOUS RESULT  Basic metabolic panel     Status: Abnormal   Collection Time: 05/03/17  3:35 AM  Result Value Ref Range   Sodium 136 135 - 145 mmol/L   Potassium 4.9 3.5 - 5.1 mmol/L   Chloride 107 101 - 111   mmol/L   CO2 22 22 - 32 mmol/L   Glucose, Bld 82 65 - 99 mg/dL   BUN 26 (H) 6 - 20 mg/dL   Creatinine, Ser 1.88 (H) 0.61 - 1.24 mg/dL   Calcium 8.8 (L) 8.9 - 10.3 mg/dL   GFR  calc non Af Amer 33 (L) >60 mL/min   GFR calc Af Amer 38 (L) >60 mL/min    Comment: (NOTE) The eGFR has been calculated using the CKD EPI equation. This calculation has not been validated in all clinical situations. eGFR's persistently <60 mL/min signify possible Chronic Kidney Disease.    Anion gap 7 5 - 15  Protime-INR     Status: Abnormal   Collection Time: 05/03/17  3:35 AM  Result Value Ref Range   Prothrombin Time 15.3 (H) 11.4 - 15.2 seconds   INR 1.20     ECG   N/A  Telemetry   A-fib with CVR - Personally Reviewed  Radiology    No results found.  Cardiac Studies   N/A  Assessment   1. Active Problems: 2.   ARF (acute renal failure) (HCC) 3.   A-fib (HCC) 4.   Acute diastolic CHF (congestive heart failure) (HCC) 5.   Acute encephalopathy 6.   Confusion 7.   Non-ST elevation (NSTEMI) myocardial infarction (HCC) 8.   Delirium tremens (HCC) 9.   Shock circulatory (HCC) 10.   Tobacco abuse 11.   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   Alcohol use disorder (HCC) 13.   Plan   1. Creatinine continues to improve - now 1.88 today. Plan for PCI of the LM/LAD today. In a-fib with RVR today - said he felt a little nauseated overnight. Start carvedilol 3.125 mg BID today (was on atenolol at home) - for rate control and CAD treatment.  Time Spent Directly with Patient:  15 minutes  Length of Stay:  LOS: 6 days   Kenneth C. Hilty, MD, FACC  [place]  [facility]  Attending Cardiologist  Direct Dial: [phone]  Fax: [phone]  Website:  [email or web address]  Kenneth C Hilty 05/03/2017, 11:22 AM   

## 2017-05-03 NOTE — Interval H&P Note (Signed)
History and Physical Interval Note:  05/03/2017 12:43 PM  Justin Parsons  has presented today for surgery, with the diagnosis of cad - nstemi.  Diagnostic Cath on 6/13 reviewed. He was seen by Dr. Servando Snare from [place] - turned down for CABG.  Seen by Dr. Burt Knack yesterday. Interventional Note: Reviewed patient's chart in detail, reviewed cath films, discussed case with Dr Debara Pickett and Dr Ellyn Hack. Pt evaluated with his cousin (Medical POA) at bedside. I have reviewed Dr Everrett Coombe cardiac surgical consultation and the patient is not a candidate for CABG with numerous medical issues as outlined.   Plan atherectomy and stenting of the ostial LAD which will require stenting back into the left main and possibly bifurcational approach if the circumflex is compromised. The patient understands the risk of the procedure is significantly higher than a standard PCI because of his comorbid conditions and coronary anatomy. The RCA is unfavorable for PCI with heavy diffuse thrombus and I think best for medical therapy with DAPT. Post-PCI would consider DAPT With ASA and plavix rather than 'triple therapy' as his risk of bleeding in the setting of cirrhosis and portal HTN is excessive.   All questions are answered. Patient scheduled for PCI tomorrow.   Sherren Mocha 05/02/2017 5:54 PM    The various methods of treatment have been discussed with the patient and family. After consideration of risks, benefits and other options for treatment, the patient has consented to  Procedure(s): Coronary Atherectomy (N/A) with STENT Intervention as a surgical intervention .    The patient's history has been reviewed, patient examined, no change in status, stable for surgery.  I have reviewed the patient's chart and labs.  Questions were answered to the patient's satisfaction.    Cath Lab Visit (complete for each Cath Lab visit)  Clinical Evaluation Leading to the Procedure:   ACS: Yes.    Non-ACS:     Anginal Classification: CCS III  Anti-ischemic medical therapy: Minimal Therapy (1 class of medications)  Non-Invasive Test Results: No non-invasive testing performed  - Dx Cath reviewed.  Prior CABG: No previous CABG - turned down for CABG.   [name]

## 2017-05-03 NOTE — Progress Notes (Signed)
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 Cannot sleep. This pain is so great he states he has not been able to get out of bed. Pt adamant about being put back on his allopurinol. States that he had been told it was stopped d/t his AKI. Too early to have q12 Tramadol. Pt states he has used ice in the past to get through the pain between medications, so this RN placed ice packs to affected areas. Pt does state some relief with this intervention, will continue to monitor.

## 2017-05-03 NOTE — Progress Notes (Signed)
ANTICOAGULATION CONSULT NOTE - Follow Up Consult  Pharmacy Consult for Heparin Indication: Afib and NSTEMI  No Known Allergies  Patient Measurements: Height: 5' 8"  (172.7 cm) Weight: 283 lb 6.4 oz (128.5 kg) IBW/kg (Calculated) : 68.4 Heparin Dosing Weight:   Vital Signs: Temp: 97.9 F (36.6 C) (06/15 0329) Temp Source: Oral (06/15 0329) BP: 137/68 (06/15 0329) Pulse Rate: 84 (06/15 0329)  Labs:  Recent Labs  05/01/17 0314  05/02/17 0540 05/02/17 1227 05/03/17 0335  HGB 12.3*  --  12.0*  --  12.2*  HCT 39.3  --  39.5  --  39.4  PLT 99*  --  104*  --  111*  LABPROT  --   --   --   --  15.3*  INR  --   --   --   --  1.20  HEPARINUNFRC 0.23*  < > 0.26* 0.47 0.43  CREATININE 2.42*  --  1.95*  --  1.88*  < > = values in this interval not displayed.  Estimated Creatinine Clearance: 43 mL/min (A) (by C-G formula based on SCr of 1.88 mg/dL (H)).   Assessment:  Anticoag: Heparin for r/o ACS, afib of unknown duration, CHADsVASC score 3 - HL 0.43, Hgb 12.2, plts 111 (up), no bleeding noted - admitted with possible stroke but neuro says no acute infarct, hx afib not anticoagulated d/t portal gastropathy, cirrhosis and ongoing EtOH abuse, will need oral anticoagulation  Goal of Therapy:  Heparin level 0.3-0.7 units/ml Monitor platelets by anticoagulation protocol: Yes   Plan:  Heparin 1600 units/hr Daily HL and CBC F/u plts - MD ok with heparin for now PCI 6/15   Justin Parsons, PharmD, BCPS Clinical Staff Pharmacist Pager [phone]  Eilene Ghazi [place]illinger 05/03/2017,9:29 AM

## 2017-05-03 NOTE — H&P (View-Only) (Signed)
 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 Plan for PCI of the LM/LAD today.   Objective   Vitals:   05/02/17 1400 05/02/17 1630 05/02/17 2115 05/03/17 0329  BP: 113/72 117/64 110/74 137/68  Pulse: 89 88 82 84  Resp:   18 18  Temp:  98 F (36.7 C) 97.7 F (36.5 C) 97.9 F (36.6 C)  TempSrc:  Oral Oral Oral  SpO2: 97% 98% 94% 96%  Weight:    283 lb 6.4 oz (128.5 kg)  Height:        Intake/Output Summary (Last 24 hours) at 05/03/17 1122 Last data filed at 05/03/17 0635  Gross per 24 hour  Intake           314.58 ml  Output             1275 ml  Net          -960.42 ml   Filed Weights   05/01/17 0500 05/02/17 0432 05/03/17 0329  Weight: 282 lb 6.4 oz (128.1 kg) 283 lb 1.6 oz (128.4 kg) 283 lb 6.4 oz (128.5 kg)    Physical Exam   General appearance: alert and no distress Lungs: clear to auscultation bilaterally Heart: regular rate and rhythm Extremities: extremities normal, atraumatic, no cyanosis or edema Neurologic: Grossly normal  Inpatient Medications    Scheduled Meds: . allopurinol  100 mg Oral Daily  . [START ON 05/04/2017] aspirin EC  81 mg Oral Daily  . atorvastatin  80 mg Oral q1800  . clopidogrel  75 mg Oral Daily  . folic acid  1 mg Oral Daily  . levothyroxine  50 mcg Oral QAC breakfast  . pantoprazole  40 mg Oral Daily  . [START ON 05/04/2017] predniSONE  20 mg Oral Q breakfast  . sodium  chloride flush  3 mL Intravenous Q12H  . sodium chloride flush  3 mL Intravenous Q12H  . thiamine injection  100 mg Intravenous Q24H    Continuous Infusions: . sodium chloride 10 mL/hr at 05/02/17 0300  . sodium chloride    . sodium chloride    . sodium chloride 75 mL/hr at 05/03/17 0604  . heparin 1,600 Units/hr (05/02/17 0643)    PRN Meds: sodium chloride, sodium chloride, gi cocktail, levalbuterol, LORazepam, ondansetron, RESOURCE THICKENUP CLEAR, sodium chloride flush, sodium chloride flush, traMADol   Labs   Results for orders placed or performed during the hospital encounter of 04/27/17 (from the past 48 hour(s))  Heparin level (unfractionated)     Status: None   Collection Time: 05/01/17 11:42 AM  Result Value Ref Range   Heparin Unfractionated 0.51 0.30 - 0.70 IU/mL    Comment:        IF HEPARIN RESULTS ARE BELOW EXPECTED VALUES, AND PATIENT DOSAGE HAS BEEN CONFIRMED, SUGGEST FOLLOW UP TESTING OF ANTITHROMBIN III LEVELS.   Heparin level (unfractionated)     Status: Abnormal   Collection   Time: 05/02/17  5:40 AM  Result Value Ref Range   Heparin Unfractionated 0.26 (L) 0.30 - 0.70 IU/mL    Comment:        IF HEPARIN RESULTS ARE BELOW EXPECTED VALUES, AND PATIENT DOSAGE HAS BEEN CONFIRMED, SUGGEST FOLLOW UP TESTING OF ANTITHROMBIN III LEVELS.   CBC     Status: Abnormal   Collection Time: 05/02/17  5:40 AM  Result Value Ref Range   WBC 9.2 4.0 - 10.5 K/uL   RBC 4.20 (L) 4.22 - 5.81 MIL/uL   Hemoglobin 12.0 (L) 13.0 - 17.0 g/dL   HCT 39.5 39.0 - 52.0 %   MCV 94.0 78.0 - 100.0 fL   MCH 28.6 26.0 - 34.0 pg   MCHC 30.4 30.0 - 36.0 g/dL   RDW 17.3 (H) 11.5 - 15.5 %   Platelets 104 (L) 150 - 400 K/uL    Comment: REPEATED TO VERIFY CONSISTENT WITH PREVIOUS RESULT   Basic metabolic panel     Status: Abnormal   Collection Time: 05/02/17  5:40 AM  Result Value Ref Range   Sodium 137 135 - 145 mmol/L   Potassium 5.2 (H) 3.5 - 5.1 mmol/L   Chloride 107 101 - 111  mmol/L   CO2 23 22 - 32 mmol/L   Glucose, Bld 87 65 - 99 mg/dL   BUN 30 (H) 6 - 20 mg/dL   Creatinine, Ser 1.95 (H) 0.61 - 1.24 mg/dL   Calcium 8.7 (L) 8.9 - 10.3 mg/dL   GFR calc non Af Amer 31 (L) >60 mL/min   GFR calc Af Amer 36 (L) >60 mL/min    Comment: (NOTE) The eGFR has been calculated using the CKD EPI equation. This calculation has not been validated in all clinical situations. eGFR's persistently <60 mL/min signify possible Chronic Kidney Disease.    Anion gap 7 5 - 15  Heparin level (unfractionated)     Status: None   Collection Time: 05/02/17 12:27 PM  Result Value Ref Range   Heparin Unfractionated 0.47 0.30 - 0.70 IU/mL    Comment:        IF HEPARIN RESULTS ARE BELOW EXPECTED VALUES, AND PATIENT DOSAGE HAS BEEN CONFIRMED, SUGGEST FOLLOW UP TESTING OF ANTITHROMBIN III LEVELS.   Heparin level (unfractionated)     Status: None   Collection Time: 05/03/17  3:35 AM  Result Value Ref Range   Heparin Unfractionated 0.43 0.30 - 0.70 IU/mL    Comment:        IF HEPARIN RESULTS ARE BELOW EXPECTED VALUES, AND PATIENT DOSAGE HAS BEEN CONFIRMED, SUGGEST FOLLOW UP TESTING OF ANTITHROMBIN III LEVELS.   CBC     Status: Abnormal   Collection Time: 05/03/17  3:35 AM  Result Value Ref Range   WBC 8.5 4.0 - 10.5 K/uL   RBC 4.20 (L) 4.22 - 5.81 MIL/uL   Hemoglobin 12.2 (L) 13.0 - 17.0 g/dL   HCT 39.4 39.0 - 52.0 %   MCV 93.8 78.0 - 100.0 fL   MCH 29.0 26.0 - 34.0 pg   MCHC 31.0 30.0 - 36.0 g/dL   RDW 17.1 (H) 11.5 - 15.5 %   Platelets 111 (L) 150 - 400 K/uL    Comment: CONSISTENT WITH PREVIOUS RESULT  Basic metabolic panel     Status: Abnormal   Collection Time: 05/03/17  3:35 AM  Result Value Ref Range   Sodium 136 135 - 145 mmol/L   Potassium 4.9 3.5 - 5.1 mmol/L   Chloride 107 101 - 111   mmol/L   CO2 22 22 - 32 mmol/L   Glucose, Bld 82 65 - 99 mg/dL   BUN 26 (H) 6 - 20 mg/dL   Creatinine, Ser 1.88 (H) 0.61 - 1.24 mg/dL   Calcium 8.8 (L) 8.9 - 10.3 mg/dL   GFR  calc non Af Amer 33 (L) >60 mL/min   GFR calc Af Amer 38 (L) >60 mL/min    Comment: (NOTE) The eGFR has been calculated using the CKD EPI equation. This calculation has not been validated in all clinical situations. eGFR's persistently <60 mL/min signify possible Chronic Kidney Disease.    Anion gap 7 5 - 15  Protime-INR     Status: Abnormal   Collection Time: 05/03/17  3:35 AM  Result Value Ref Range   Prothrombin Time 15.3 (H) 11.4 - 15.2 seconds   INR 1.20     ECG   N/A  Telemetry   A-fib with CVR - Personally Reviewed  Radiology    No results found.  Cardiac Studies   N/A  Assessment   1. Active Problems: 2.   ARF (acute renal failure) ([place]) 3.   A-fib ([place]) 4.   Acute diastolic CHF (congestive heart failure) ([place]) 5.   Acute encephalopathy 6.   Confusion 7.   Non-ST elevation (NSTEMI) myocardial infarction ([place]) 8.   Delirium tremens ([place]) 9.   Shock circulatory ([place]) 10.   Tobacco abuse 11.   Acute renal failure superimposed on stage 3 chronic kidney disease ([place]) 12.   Alcohol use disorder ([place]) 13.   Plan   1. Creatinine continues to improve - now 1.88 today. Plan for PCI of the LM/LAD today. In a-fib with RVR today - said he felt a little nauseated overnight. Start carvedilol 3.125 mg BID today (was on atenolol at home) - for rate control and CAD treatment.  Time Spent Directly with Patient:  15 minutes  Length of Stay:  LOS: 6 days   Pixie Casino, MD, [place]  Attending Cardiologist  Direct Dial: [phone]  Fax: [phone]  Website:  [email or web address]  Nadean Corwin Evalyse Stroope 05/03/2017, 11:22 AM

## 2017-05-03 NOTE — Progress Notes (Signed)
[facility] PROGRESS NOTE  Justin Parsons BTD:[id number] DOB: 1941/01/06 DOA: 04/27/2017 PCP: Monico Blitz, MD   Interim summary and history of present illness  76 year old man with a notable history of chronic kidney disease stage III, alcoholism, hypertension, hypothyroidism, gout and GERD; who was admitted secondary to presyncope while working outside. Patient was found with acute on chronic renal failure and circulatory shock suspected to be secondary to NSTEMI.   Assessment/Plan: 1-AMS/acute encephalopathy/aphasia -Most likely associated with shock brain due to hypotension and decreased perfusion; also considering associated to alcohol and the use of Neurontin in the presence of acute renal failure. -patient mentation has remained clear and currently AAOX3 -no evidence of stroke radiographically  -patient also with EEG that didn't demonstrate epileptic abnormalities   -VSS overall and no requiring pressors -was on precedex on admission, now off and stable overall.   2-NSTEMI -cardiology on board -not a candidate for CABG; multiple vessel disease appreciated on left heart cath -planning for PCI intervention later today -continue heparin drip for now  3-acute on chronic renal failure stage III at baseline: Creatinine in the range of 2.1 about 3 years ago -Cr continue improving; now down to 1.88  -patient endorses good urine output -no dysuria reported  -will follow electrolytes an renal function; especially with anticipated cath and intervention  -minimize nephrotoxic drugs  4-atrial fibrillation: unknown duration, but most likely chronic  -CHADsVASC score 3 -patient will need long term anticoagulation (maybe eliquis) -will follow cardiology rec's -continue monitoring on telemetry -rate controlled currently  5-alcohol abuse and prior hx of liver cirrhosis with portal gastropathy  -cessation counseling and absolute abstinence encouraged/advised -LFT's and INR  normal -no active bleeding -will continue PPI  6-thrombocytopenia -no acute bleeding appreciated -will continue close monitoring; especially while on heparin drip -numbers has remained stable -most likely associated with alcohol use and underlying mild cirrhosis  7-circulatory shock/hypotension -secondary to NSTEMI -troponin > 40 -no CP -remained hemodynamically stable  -patient had left heart cath demonstrating multiple vessel disease and not a candidate for CABG -will proceed with PCI intervention by cardiology service today 6/15 -continue supportive care  8-physical deconditioning  -will arrange discharge to SNF when medically stable for rehabilitation and conditioning   9-Gout -will resume allopurinol -start treatment with prednisone   Code Status: Full code Family Communication: No family at bedside Disposition Plan: planning PCI intervention later today; will need SNF at discharge. Will resume allopurinol and use prednisone for gout. Continue heparin drip for now. Hemodynamically stable and w/o signs of acute bleeding.  Consultants:  PCCM  Cardiology   Cardiothoracic surgery  Neurology   Procedures:  See below for x-ray reports  2-D echo - Left ventricle: Endocardium not well seen. The cavity size was normal. Wall thickness was increased in a pattern of severe LVH. Systolic function was normal. The estimated ejection fraction was in the range of 50% to 55%. The study is not technically sufficient to allow evaluation of LV diastolic function. - Aortic valve: Calcified non coronary cusp. - Mitral valve: Calcfied anterior leaflet Calcified annulus. There was mild regurgitation. - Left atrium: The atrium was moderately dilated. - Atrial septum: No defect or patent foramen ovale was identified. - Pulmonary arteries: PA peak pressure: 49 mm Hg (S).  Left heart cath: Multiple vessels are a stenosis and thrombotic, not a good candidate for PCI therapy.  Cardiology has discussed with cardiothoracic surgeon for evaluation regarding CABG; not a good candidate for CABG.   Antibiotics:  None  HPI/Subjective:  No CP, no fever, no SOB. Patient reporting pain in his hands, ankles and shoulders from gout.   Objective: Vitals:   05/02/17 2115 05/03/17 0329  BP: 110/74 137/68  Pulse: 82 84  Resp: 18 18  Temp: 97.7 F (36.5 C) 97.9 F (36.6 C)    Intake/Output Summary (Last 24 hours) at 05/03/17 0827 Last data filed at 05/03/17 1025  Gross per 24 hour  Intake           314.58 ml  Output             1275 ml  Net          -960.42 ml   Filed Weights   05/01/17 0500 05/02/17 0432 05/03/17 0329  Weight: 128.1 kg (282 lb 6.4 oz) 128.4 kg (283 lb 1.6 oz) 128.5 kg (283 lb 6.4 oz)    Exam:   General: afebrile, denies CP and endorses breathing much better. Main complaint is related to gout, affecting mainly hands, ankles and shoulders. During my evaluation patient was on RA and maintained good O2 sat.  Cardiovascular: rate controlled, irregular rhythm. No rubs, no gallops and no JVD.  Respiratory: no wheezing, no crackles, good air movement   Abdomen:  Obese, soft, NT, ND, positive BS   Musculoskeletal: trace edema bilaterally, no cyanosis, no clubbing, tenderness on his ankle and hands, decrease ROM.     Data Reviewed: Basic Metabolic Panel:  Recent Labs Lab 04/28/17 0042 04/29/17 0043 04/29/17 1038 04/30/17 0149 05/01/17 0314 05/02/17 0540 05/03/17 0335  NA 138 136 138 137 137 137 136  K 5.5* 4.7 5.0 4.5 4.5 5.2* 4.9  CL 110 104 107 108 107 107 107  CO2 20* 24 19* 22 22 23 22   GLUCOSE 105* 114* 88 82 118* 87 82  BUN 50* 49* 47* 40* 39* 30* 26*  CREATININE 3.36* 3.41* 3.25* 2.42* 2.42* 1.95* 1.88*  CALCIUM 8.0* 8.3* 8.5* 8.3* 8.2* 8.7* 8.8*  MG 1.6* 1.9  --   --  1.9  --   --   PHOS 5.1* 4.6  --   --  3.3  --   --    Liver Function Tests:  Recent Labs Lab 05/01/17 0314  AST 34  ALT 20  ALKPHOS 59  BILITOT 1.1   PROT 5.9*  ALBUMIN 3.1*    Recent Labs Lab 04/27/17 2129  AMMONIA 18   CBC:  Recent Labs Lab 04/28/17 0042  04/30/17 0149 04/30/17 1547 05/01/17 0314 05/02/17 0540 05/03/17 0335  WBC 13.3*  < > 10.0 10.3 9.3 9.2 8.5  NEUTROABS 10.1*  --   --  8.1*  --   --   --   HGB 14.0  < > 12.3* 12.3* 12.3* 12.0* 12.2*  HCT 43.4  < > 38.8* 39.4 39.3 39.5 39.4  MCV 91.6  < > 91.7 92.7 92.9 94.0 93.8  PLT 110*  < > 93* 115* 99* 104* 111*  < > = values in this interval not displayed. Cardiac Enzymes:  Recent Labs Lab 04/28/17 0042 04/28/17 1409 04/28/17 1930 04/29/17 1038  CKTOTAL 935*  --   --  299  CKMB  --   --   --  12.8*  TROPONINI  --  40.36* 35.27*  --    CBG: No results for input(s): GLUCAP in the last 168 hours.  Recent Results (from the past 240 hour(s))  MRSA PCR Screening     Status: None   Collection Time: 04/27/17  6:38 PM  Result  Value Ref Range Status   MRSA by PCR NEGATIVE NEGATIVE Final    Comment:        The GeneXpert MRSA Assay (FDA approved for NASAL specimens only), is one component of a comprehensive MRSA colonization surveillance program. It is not intended to diagnose MRSA infection nor to guide or monitor treatment for MRSA infections.      Studies: No results found.  Scheduled Meds: . allopurinol  100 mg Oral Daily  . [START ON 05/04/2017] aspirin EC  81 mg Oral Daily  . atorvastatin  80 mg Oral q1800  . clopidogrel  75 mg Oral Daily  . folic acid  1 mg Oral Daily  . levothyroxine  50 mcg Oral QAC breakfast  . pantoprazole  40 mg Oral Daily  . [START ON 05/04/2017] predniSONE  20 mg Oral Q breakfast  . sodium chloride flush  3 mL Intravenous Q12H  . sodium chloride flush  3 mL Intravenous Q12H  . thiamine injection  100 mg Intravenous Q24H   Continuous Infusions: . sodium chloride 10 mL/hr at 05/02/17 0300  . sodium chloride    . sodium chloride    . sodium chloride 75 mL/hr at 05/03/17 0604  . heparin 1,600 Units/hr (05/02/17  3167)      ARF (acute renal failure) (HCC)   CKD stage 3   A-fib (HCC)   Acute encephalopathy   Confusion   Non-ST elevation (NSTEMI) myocardial infarction [facility]   Shock circulatory ([place])    Time spent: 35 minutes   Barton Dubois  [facility] Pager [phone] If 7PM-7AM, please contact night-coverage at [email or web address], password [facility] 05/03/2017, 8:27 AM  LOS: 6 days

## 2017-05-03 NOTE — Care Management Important Message (Signed)
Important Message  Patient Details  Name: Justin Parsons MRN: [id number] Date of Birth: 12/20/1940   Medicare Important Message Given:  Yes    Orbie Pyo 05/03/2017, 1:01 PM

## 2017-05-03 NOTE — Progress Notes (Signed)
ANTICOAGULATION CONSULT NOTE - Follow Up Consult  Pharmacy Consult for Heparin Indication: Afib and NSTEMI  No Known Allergies  Patient Measurements: Height: 5' 8"  (172.7 cm) Weight: 283 lb 6.4 oz (128.5 kg) IBW/kg (Calculated) : 68.4 Heparin Dosing Weight:   Vital Signs: Temp: 97.6 F (36.4 C) (06/15 1550) Temp Source: Oral (06/15 1550) BP: 114/76 (06/15 1550) Pulse Rate: 102 (06/15 1550)  Labs:  Recent Labs  05/01/17 0314  05/02/17 0540 05/02/17 1227 05/03/17 0335  HGB 12.3*  --  12.0*  --  12.2*  HCT 39.3  --  39.5  --  39.4  PLT 99*  --  104*  --  111*  LABPROT  --   --   --   --  15.3*  INR  --   --   --   --  1.20  HEPARINUNFRC 0.23*  < > 0.26* 0.47 0.43  CREATININE 2.42*  --  1.95*  --  1.88*  < > = values in this interval not displayed.  Estimated Creatinine Clearance: 43 mL/min (A) (by C-G formula based on SCr of 1.88 mg/dL (H)).   Assessment:  Anticoag: Heparin for r/o ACS, afib of unknown duration, CHADsVASC score 3 -  Heparin to resume 6 hours post sheath removal at 1445 pm   Goal of Therapy:  Heparin level 0.3-0.7 units/ml Monitor platelets by anticoagulation protocol: Yes   Plan:  Heparin 1600 units/hr to restart at 20:45 pm Daily HL and CBC  Thank you Anette Guarneri, PharmD [phone] 05/03/2017,5:00 PM

## 2017-05-03 NOTE — Care Management Note (Signed)
Case Management Note  Patient Details  Name: Justin Parsons MRN: [id number] Date of Birth: [birth year]  Subjective/Objective:   From home, s/p Successful atherectomy  , will be on plavix and asa.  PCP   Monico Blitz              Action/Plan: NCM will follow for dc needs.   Expected Discharge Date:  04/30/17               Expected Discharge Plan:  Home/Self Care  In-House Referral:  Clinical Social Work  Discharge planning Services  CM Consult  Post Acute Care Choice:    Choice offered to:     DME Arranged:    DME Agency:     HH Arranged:    HH Agency:     Status of Service:  Completed, signed off  If discussed at [employer], dates discussed:    Additional Comments:  Zenon Mayo, RN 05/03/2017, 4:34 PM

## 2017-05-03 NOTE — Progress Notes (Signed)
SLP Cancellation Note  Patient Details Name: Justin Parsons MRN: [id number] DOB: 02-02-1941   Cancelled treatment:       Reason Eval/Treat Not Completed: Patient at procedure or test/unavailable (NPO for procedure later today. Will f/u.) [name], CCC-SLP [phone]   [name] 05/03/2017, 11:09 AM

## 2017-05-04 DIAGNOSIS — I25119 Atherosclerotic heart disease of native coronary artery with unspecified angina pectoris: Secondary | ICD-10-CM

## 2017-05-04 DIAGNOSIS — M1000 Idiopathic gout, unspecified site: Secondary | ICD-10-CM

## 2017-05-04 LAB — CBC
HEMATOCRIT: 38.2 % — AB (ref 39.0–52.0)
HEMOGLOBIN: 11.9 g/dL — AB (ref 13.0–17.0)
MCH: 29 pg (ref 26.0–34.0)
MCHC: 31.2 g/dL (ref 30.0–36.0)
MCV: 93.2 fL (ref 78.0–100.0)
Platelets: 110 10*3/uL — ABNORMAL LOW (ref 150–400)
RBC: 4.1 MIL/uL — ABNORMAL LOW (ref 4.22–5.81)
RDW: 17.3 % — ABNORMAL HIGH (ref 11.5–15.5)
WBC: 8.8 10*3/uL (ref 4.0–10.5)

## 2017-05-04 LAB — BASIC METABOLIC PANEL
ANION GAP: 5 (ref 5–15)
BUN: 25 mg/dL — ABNORMAL HIGH (ref 6–20)
CHLORIDE: 102 mmol/L (ref 101–111)
CO2: 28 mmol/L (ref 22–32)
Calcium: 9.1 mg/dL (ref 8.9–10.3)
Creatinine, Ser: 2.13 mg/dL — ABNORMAL HIGH (ref 0.61–1.24)
GFR calc non Af Amer: 28 mL/min — ABNORMAL LOW (ref >60)
GFR, EST AFRICAN AMERICAN: 33 mL/min — AB (ref >60)
Glucose, Bld: 123 mg/dL — ABNORMAL HIGH (ref 65–99)
Potassium: 4.9 mmol/L (ref 3.5–5.1)
Sodium: 135 mmol/L (ref 135–145)

## 2017-05-04 LAB — HEPARIN LEVEL (UNFRACTIONATED): Heparin Unfractionated: 0.41 IU/mL (ref 0.30–0.70)

## 2017-05-04 MED ORDER — HEPARIN (PORCINE) IN NACL 100-0.45 UNIT/ML-% IJ SOLN
1600.0000 [IU]/h | INTRAMUSCULAR | Status: DC
  Administered 2017-05-04 – 2017-05-05 (×3): 1600 [IU]/h via INTRAVENOUS
  Filled 2017-05-04 (×3): qty 250

## 2017-05-04 MED ORDER — POLYETHYLENE GLYCOL 3350 17 G PO PACK
17.0000 g | PACK | Freq: Every day | ORAL | Status: DC
  Administered 2017-05-04 – 2017-05-06 (×3): 17 g via ORAL
  Filled 2017-05-04 (×3): qty 1

## 2017-05-04 MED ORDER — ANGIOPLASTY BOOK
Freq: Once | Status: AC
  Administered 2017-05-04: 03:00:00 1
  Filled 2017-05-04: qty 1

## 2017-05-04 MED ORDER — OFF THE BEAT BOOK
Freq: Once | Status: AC
  Administered 2017-05-04: 03:00:00 1
  Filled 2017-05-04: qty 1

## 2017-05-04 MED ORDER — DOCUSATE SODIUM 100 MG PO CAPS
100.0000 mg | ORAL_CAPSULE | Freq: Two times a day (BID) | ORAL | Status: DC
  Administered 2017-05-04 – 2017-05-06 (×4): 100 mg via ORAL
  Filled 2017-05-04 (×4): qty 1

## 2017-05-04 NOTE — Progress Notes (Signed)
Progress Note  Patient Name: Justin Parsons Date of Encounter: 05/04/2017  Primary Cardiologist: New to [facility] seen by Dr. Martinique initially (will be followed in [place] as he lives there)  Subjective   No complains. No chest pain or dyspnea.   Inpatient Medications    Scheduled Meds: . allopurinol  100 mg Oral Daily  . aspirin EC  81 mg Oral Daily  . atorvastatin  80 mg Oral q1800  . carvedilol  3.125 mg Oral BID WC  . clopidogrel  75 mg Oral Daily  . folic acid  1 mg Oral Daily  . levothyroxine  50 mcg Oral QAC breakfast  . pantoprazole  40 mg Oral Daily  . predniSONE  20 mg Oral Q breakfast  . sodium chloride flush  3 mL Intravenous Q12H  . sodium chloride flush  3 mL Intravenous Q12H  . thiamine injection  100 mg Intravenous Q24H   Continuous Infusions: . sodium chloride 10 mL/hr at 05/02/17 0300  . sodium chloride    . sodium chloride    . heparin 1,600 Units/hr (05/04/17 0600)   PRN Meds: sodium chloride, sodium chloride, gi cocktail, levalbuterol, LORazepam, ondansetron, RESOURCE THICKENUP CLEAR, sodium chloride flush, sodium chloride flush, traMADol   Vital Signs    Vitals:   05/04/17 0400 05/04/17 0500 05/04/17 0655 05/04/17 0700  BP: 131/76 107/63 108/67 128/78  Pulse:   (!) 109 91  Resp: 15 16  18   Temp:    97.7 F (36.5 C)  TempSrc:    Oral  SpO2: 95% 99%  97%  Weight:      Height:        Intake/Output Summary (Last 24 hours) at 05/04/17 0849 Last data filed at 05/04/17 0600  Gross per 24 hour  Intake          1037.17 ml  Output             4575 ml  Net         -3537.83 ml   Filed Weights   05/02/17 0432 05/03/17 0329 05/04/17 0039  Weight: 283 lb 1.6 oz (128.4 kg) 283 lb 6.4 oz (128.5 kg) 277 lb 5.4 oz (125.8 kg)    Telemetry   afib at rate of 90s- Personally Reviewed  ECG    afib at rate of 101 bpm - Personally Reviewed  Physical Exam   GEN: No acute distress.   Neck: No JVD Cardiac: RRR, no murmurs, rubs, or gallops. Right  radial cath site stable. Respiratory: Clear to auscultation bilaterally. GI: Soft, nontender, non-distended  MS: No edema; No deformity. Neuro:  Nonfocal  Psych: Normal affect   Labs    Chemistry Recent Labs Lab 05/01/17 0314 05/02/17 0540 05/03/17 0335 05/04/17 0224  NA 137 137 136 135  K 4.5 5.2* 4.9 4.9  CL 107 107 107 102  CO2 22 23 22 28   GLUCOSE 118* 87 82 123*  BUN 39* 30* 26* 25*  CREATININE 2.42* 1.95* 1.88* 2.13*  CALCIUM 8.2* 8.7* 8.8* 9.1  PROT 5.9*  --   --   --   ALBUMIN 3.1*  --   --   --   AST 34  --   --   --   ALT 20  --   --   --   ALKPHOS 59  --   --   --   BILITOT 1.1  --   --   --   GFRNONAA 24* 31* 33* 28*  GFRAA 28*  36* 38* 33*  ANIONGAP 8 7 7 5      Hematology Recent Labs Lab 05/02/17 0540 05/03/17 0335 05/04/17 0224  WBC 9.2 8.5 8.8  RBC 4.20* 4.20* 4.10*  HGB 12.0* 12.2* 11.9*  HCT 39.5 39.4 38.2*  MCV 94.0 93.8 93.2  MCH 28.6 29.0 29.0  MCHC 30.4 31.0 31.2  RDW 17.3* 17.1* 17.3*  PLT 104* 111* 110*    Cardiac Enzymes Recent Labs Lab 04/28/17 1409 04/28/17 1930  TROPONINI 40.36* 35.27*   No results for input(s): TROPIPOC in the last 168 hours.   BNPNo results for input(s): BNP, PROBNP in the last 168 hours.   DDimer No results for input(s): DDIMER in the last 168 hours.   Radiology    No results found.  Cardiac Studies   Echo 04/29/15 Study Conclusions  - Left ventricle: Endocardium not well seen. The cavity size was   normal. Wall thickness was increased in a pattern of severe LVH.   Systolic function was normal. The estimated ejection fraction was   in the range of 50% to 55%. The study is not technically   sufficient to allow evaluation of LV diastolic function. - Aortic valve: Calcified non coronary cusp. - Mitral valve: Calcfied anterior leaflet Calcified annulus. There   was mild regurgitation. - Left atrium: The atrium was moderately dilated. - Atrial septum: No defect or patent foramen ovale was  identified. - Pulmonary arteries: PA peak pressure: 49 mm Hg (S).  Cath 05/01/17 Left Heart Cath and Coronary Angiography  Conclusion     Ost LAD lesion, 85 %stenosed - apparently thrombotic with TIMI 2 flow distally  1st Diag lesion(major trunk of diagonal), 100 %stenosed.  1st Mrg lesion, 100 %stenosed.  Extensively diseased RCA that is a very large caliber vessel/ectatic with extensive thrombus in the proximal to mid portion with reduced flow distally  Prox RCA to Mid RCA lesion, 75 %stenosed. heavily thrombotic, ulcerated irregular vessel  Mid RCA to Dist RCA lesion, 60 %stenosed. Dist RCA to tandem lesions, 70 %stenosed.  Post Atrio lesion, 80 %stenosed - also appears to be ulcerated and thrombotic with some ectatic segments.  HEMODYNAMICS  LV end diastolic pressure is mildly elevated.  There is no aortic valve stenosis.   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 The 2 occluded vessels being diagonal branch and OM are not very large caliber vessels, however diagonal branch does appear to have the potential cover large area of distribution.  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 He would not be a good candidate for PCI as this would require extensive stents, and elevate the LAD lesion is amenable to PCI. I will restart IV heparin for atrial fibrillation (I think were too far out to start Aggrastat).   The case was discussed with Dr. Debara Pickett who will contact CT surgery for consultation to determine his appropriateness for CABG   Coronary Stent Intervention  05/03/17  Coronary Atherectomy  Conclusion     Ost LAD lesion, 85 %stenosed.  A STENT SYNERGY DES 4X12 drug eluting stent was successfully placed. Post-dilated to 5.1 mm  Post intervention,  there is a 0% residual stenosis.  1st Diag lesion, 100 %stenosed. unable to cross with wire  1st Mrg lesion, 100 %stenosed.  Severely elevated LVEDP of 36 mmHg   Successful atherectomy based PCI of ostial LAD landing a stent  just within the Left Main but not encroaching upon the Circumflex. The stent was postdilated up to 5.1 mm..  The patient required significant amount of contrast simply because of his body habitus and poor visibility trying to place an ostial stent.  An attempt was made to wire the occluded branch of the first diagonal. The guidewire would not cross. However there did appear to be some faint filling further down than the original occlusion site on post-PCI angiography.  Plan: The patient is being transferred to 6 Central post procedure unit for TR band removal and ongoing monitoring. I'll write for least one dose of IV Lasix to be given this evening. He may require further dosing based on his elevated EDP.  He will be given gentle hydration given his CK D.  Plan for now will be to treat the occluded OM branch and diagonal branch medically. I was unable to engage the RCA which was the initial plan.  This can be evaluated at a later date.  He will need to be on aspirin plus Plavix for at least one year. Would not do triple therapy based on his bleeding risk.   Diagnostic Diagram       Post-Intervention Diagram          Patient Profile     76 yo WM w/ h/o cirrhosis of the liver, CKD, HTN, cirrhosis,and h/o TIA, transferred from ED at [facility] for evaluation of chest pain, hypotension and bradycardia. 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  He was noted to be bradycardic with junctional rhythm and Afib. He was hypotensive with BP of 63/49. Given atropine IV and fluids. Stat CT of chest/abdomen/pelvis obtained which showed no acute process/ dissection/or PE. Also with AKI with admission troponin up.   Assessment & Plan      1. NSTEMI - Peak of troponin 40.36. Treated with IV heparin.  Echo with normal LVEF at 50-55%. 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 Seen by CTCA and did not felt good candidate for CABG. Case was reviewed with Dr. Burt Knack who recommended atherectomy and stenting of the ostial LAD which will require stenting back into the left main and possibly bifurcational approach if the circumflex is compromised.  - S/p Successful atherectomy based PCI of ostial LAD landing a stent just within the Left Main but not encroaching upon the Circumflex. The stent was postdilated up to 5.1 mm. Unable to cross wire across diag branch. Unable to engage the RCA which was the initial plan due to large contrast was used.  This can be evaluated at a later date.  - Continue ASA, Plavix , BB and statin.   2. Atrial fibrillation likely chronic - Rate relatively controlled. Plan to treated with DAPT therapy as his risk of bleeding in the setting of cirrhosis and portal HTN is excessive. Tolerating IV heparin well here.   3. Acute on CKD, stage III - Baseline around 2.1. On admission 3.36 that improved to 1.88 yesterday. Post PCI (large contrast used yesterday) Scr of 2.13 today. Follow closely.  4. Liver cirrhosis with portal gastropathy  5. Encephalopahy    Signed, Leanor Kail, PA  05/04/2017, 8:49 AM    Agree with note by Robbie Lis PA-C  Patient had non-STEMI on admission although his troponins rose to 40. He has what appears to be chronic A. fib, chronic renal insufficiency, cirrhosis with portal hypertension. He underwent angiography by Dr. Ellyn Hack revealing three-vessel disease. He has preserved LV function. Because  of his renal deficiency and the complexity of his coronary anatomy he had staged high-risk orbital atherectomy, PCI and drug-eluting stenting of the ostial LAD with an excellent antibiotic result. The stent was postdilated 5.1 mm. He did have residual disease with plans on treating  conservatively at this time. He denies chest pain or shortness of breath. His right radial puncture site is well-healed. Given his portal hypertension I agree the triple therapy would be high-risk for bleeding despite his atrial fibrillation. I suggest to antiplatelet therapy for a month followed by discontinuation of aspirin and beginning a novel oral anticoagulant at that time. He lives in [place] and therefore following up with one of our [facility] cardiologist would be easier for the patient and compliance as well. He has not ambulated size suggest ventilation with cardiac rehabilitation and possible discharge on Monday.  Lorretta Harp, M.D., [place], [facility], Laverta [place] Glasscock [address] [place],   [address]  [phone] 05/04/2017 9:50 AM

## 2017-05-04 NOTE — Progress Notes (Addendum)
PHASE I CARDIAC REHAB  Pt unable to ambulate. Awaiting PT.  Pt with mild confusion.  Pt states unable to feed himself.  Pt given clear liquids from tray while education completed.  Pt instructed to ambulate within home, importance of medication compliance and keeping follow up appointment.  Pt states he uses rolling walker and cane at home. Pt reports he lives with a friend who assists him with meals.  Pt reports he was previously independent with taking his medication at home.  Pt repositioned in bed with cna assistance, call light in reach.  Pt unable to participate in outpatient CR with current condition.  Discharge plans unknown at this time. Phase II Referral sent to [facility].   [id number] Andi Hence, RN, BSN Cardiac Pulmonary Rehab

## 2017-05-04 NOTE — Progress Notes (Signed)
[facility] PROGRESS NOTE  Justin CZERWINSKI GSU:[id number] DOB: [birth year] DOA: 04/27/2017 PCP: Monico Blitz, MD   Interim summary and history of present illness  76 year old man with a notable history of chronic kidney disease stage III, alcoholism, hypertension, hypothyroidism, gout and GERD; who was admitted secondary to presyncope while working outside. Patient was found with acute on chronic renal failure and circulatory shock suspected to be secondary to NSTEMI.   Assessment/Plan: 1-AMS/acute encephalopathy/aphasia -Most likely associated with shock brain due to hypotension and decreased perfusion with circulatory shock; also considering association to alcohol and the use of Neurontin in the presence of acute renal failure. -mentation has now resolved and remained stable > 72 hours; AAOX3 -no evidence of stroke radiographically  -patient also with EEG that didn't demonstrate epileptic abnormalities   -VSS has remained stable and patient is off pressors or any support -was on precedex on admission, now off and stable overall.   2-NSTEMI -cardiology on board -patient was found not a candidate for CABG; multiple vessel disease appreciated on left heart cath on 6/13 -s/p repeat cath and PCI on 6/15 -will continue ASA, plavix, B-blockers and statins -also on heparin as per cardiology rec's  3-acute on chronic renal failure stage III at baseline: Creatinine in the range of 2.1 about 3 years ago -Cr went up after cath; 2.13 today -will monitor closely -patient reports good urine output -no dysuria -continue minimizing/avoiding nephrotoxic agents   4-atrial fibrillation: unknown duration, but most likely chronic  -CHADsVASC score 3 -patient will need long term anticoagulation (maybe eliquis at some point); will follow cardiology rec's -continue telemetry monitoring  -started on low dose coreg on 6/15 -will monitor.  5-alcohol abuse and prior hx of liver cirrhosis with  portal gastropathy  -cessation counseling and absolute abstinence encouraged/advised -LFT's and INR has remained WNL -no signs of active bleeding appreciated  -will continue PPI  6-thrombocytopenia -no signs of acute bleeding  -will continue close monitoring of Hgb trend  -ok to continue heparin  -most likely associated with alcohol use and underlying mild cirrhosis  7-circulatory shock/hypotension -secondary to NSTEMI -troponin > 40 -chest pain free -patient had left heart cath demonstrating multiple vessel disease and not a candidate for CABG -PCI on 6/15, tolerated procedure well and has remained hemodynamically stable   -continue aspirin, plavix, statin and heparin for now -will follow cardiology rec's   8-physical deconditioning  -will need SNF at discharge -most likely ready by 6/18 as per cardiology rec's  9-Gout -continue allopurinol and prednisone  10-constipation -will use miralax and colace -follow BM response   Code Status: Full code Family Communication: No family at bedside Disposition Plan: status post PCI (overall well tolerated). Patient has kept on heparin drip as per cardiology rec's; follow renal function.  Consultants:  PCCM  Cardiology   Cardiothoracic surgery  Neurology   Procedures:  See below for x-ray reports  2-D echo - Left ventricle: Endocardium not well seen. The cavity size was normal. Wall thickness was increased in a pattern of severe LVH. Systolic function was normal. The estimated ejection fraction was in the range of 50% to 55%. The study is not technically sufficient to allow evaluation of LV diastolic function. - Aortic valve: Calcified non coronary cusp. - Mitral valve: Calcfied anterior leaflet Calcified annulus. There was mild regurgitation. - Left atrium: The atrium was moderately dilated. - Atrial septum: No defect or patent foramen ovale was identified. - Pulmonary arteries: PA peak pressure: 49 mm Hg  (S).  Left  heart cath: Multiple vessels are a stenosis and thrombotic, not a good candidate for PCI therapy. Cardiology has discussed with cardiothoracic surgeon for evaluation regarding CABG; not a good candidate for CABG.   Left heart cath repeated with PCI intervention on 6/15  Antibiotics:  None  HPI/Subjective: No CP, no fever, no nausea, no vomiting. Patient reports no SOB.   Objective: Vitals:   05/04/17 1020 05/04/17 1429  BP: 125/68 132/86  Pulse: 96 87  Resp:  16  Temp: 97.7 F (36.5 C) 97.5 F (36.4 C)    Intake/Output Summary (Last 24 hours) at 05/04/17 1650 Last data filed at 05/04/17 1000  Gross per 24 hour  Intake          1517.17 ml  Output             5425 ml  Net         -3907.83 ml   Filed Weights   05/02/17 0432 05/03/17 0329 05/04/17 0039  Weight: 128.4 kg (283 lb 1.6 oz) 128.5 kg (283 lb 6.4 oz) 125.8 kg (277 lb 5.4 oz)    Exam:   General: afebrile, no CP and reports breathing is ok. Continue to have disseminated joint pain, but endorses some improvement with use of prednisone. Denies bleeding and tolerated cath well.  Cardiovascular: rate controlled, no JVD, no rubs, no gallops  Respiratory: no wheezing, no crackles, no using accessory muscles   Abdomen:  Obese, soft, NT, ND, positive BS  Musculoskeletal: trace edema bilaterally, no cyanosis and no clubbing; some stiffness in his hand and ankles appreciated.     Data Reviewed: Basic Metabolic Panel:  Recent Labs Lab 04/28/17 0042 04/29/17 0043  04/30/17 0149 05/01/17 0314 05/02/17 0540 05/03/17 0335 05/04/17 0224  NA 138 136  < > 137 137 137 136 135  K 5.5* 4.7  < > 4.5 4.5 5.2* 4.9 4.9  CL 110 104  < > 108 107 107 107 102  CO2 20* 24  < > 22 22 23 22 28   GLUCOSE 105* 114*  < > 82 118* 87 82 123*  BUN 50* 49*  < > 40* 39* 30* 26* 25*  CREATININE 3.36* 3.41*  < > 2.42* 2.42* 1.95* 1.88* 2.13*  CALCIUM 8.0* 8.3*  < > 8.3* 8.2* 8.7* 8.8* 9.1  MG 1.6* 1.9  --   --  1.9  --   --    --   PHOS 5.1* 4.6  --   --  3.3  --   --   --   < > = values in this interval not displayed. Liver Function Tests:  Recent Labs Lab 05/01/17 0314  AST 34  ALT 20  ALKPHOS 59  BILITOT 1.1  PROT 5.9*  ALBUMIN 3.1*    Recent Labs Lab 04/27/17 2129  AMMONIA 18   CBC:  Recent Labs Lab 04/28/17 0042  04/30/17 1547 05/01/17 0314 05/02/17 0540 05/03/17 0335 05/04/17 0224  WBC 13.3*  < > 10.3 9.3 9.2 8.5 8.8  NEUTROABS 10.1*  --  8.1*  --   --   --   --   HGB 14.0  < > 12.3* 12.3* 12.0* 12.2* 11.9*  HCT 43.4  < > 39.4 39.3 39.5 39.4 38.2*  MCV 91.6  < > 92.7 92.9 94.0 93.8 93.2  PLT 110*  < > 115* 99* 104* 111* 110*  < > = values in this interval not displayed. Cardiac Enzymes:  Recent Labs Lab 04/28/17 0042 04/28/17 1409 04/28/17 1930  04/29/17 1038  CKTOTAL 935*  --   --  299  CKMB  --   --   --  12.8*  TROPONINI  --  40.36* 35.27*  --    CBG: No results for input(s): GLUCAP in the last 168 hours.  Recent Results (from the past 240 hour(s))  MRSA PCR Screening     Status: None   Collection Time: 04/27/17  6:38 PM  Result Value Ref Range Status   MRSA by PCR NEGATIVE NEGATIVE Final    Comment:        The GeneXpert MRSA Assay (FDA approved for NASAL specimens only), is one component of a comprehensive MRSA colonization surveillance program. It is not intended to diagnose MRSA infection nor to guide or monitor treatment for MRSA infections.      Studies: No results found.  Scheduled Meds: . allopurinol  100 mg Oral Daily  . aspirin EC  81 mg Oral Daily  . atorvastatin  80 mg Oral q1800  . carvedilol  3.125 mg Oral BID WC  . clopidogrel  75 mg Oral Daily  . folic acid  1 mg Oral Daily  . levothyroxine  50 mcg Oral QAC breakfast  . pantoprazole  40 mg Oral Daily  . predniSONE  20 mg Oral Q breakfast  . sodium chloride flush  3 mL Intravenous Q12H  . sodium chloride flush  3 mL Intravenous Q12H  . thiamine injection  100 mg Intravenous Q24H    Continuous Infusions: . sodium chloride 10 mL/hr at 05/02/17 0300  . sodium chloride    . sodium chloride    . heparin 1,600 Units/hr (05/04/17 0600)      ARF (acute renal failure) (HCC)   CKD stage 3   A-fib (HCC)   Acute encephalopathy   Confusion   Non-ST elevation (NSTEMI) myocardial infarction [facility])   Shock circulatory ([place])    Time spent: 35 minutes   Barton Dubois  [facility] Pager [phone] If 7PM-7AM, please contact night-coverage at [email or web address], password [facility] 05/04/2017, 4:50 PM  LOS: 7 days

## 2017-05-04 NOTE — Progress Notes (Signed)
Called report to [place] on 2W, patient transferred with all belongings.

## 2017-05-04 NOTE — Progress Notes (Signed)
TR BAND REMOVAL  LOCATION:    R radial  DEFLATED PER PROTOCOL:   Yes  TIME BAND OFF / DRESSING APPLIED:   21:00  SITE UPON ARRIVAL:    Level 0  SITE AFTER BAND REMOVAL:    Level 0  CIRCULATION SENSATION AND MOVEMENT:    Within Normal Limits Yes  COMMENTS:   Pt tolerated procedure well. VSS  Post TR Band instructions given.Will continue to monitor and reinforce instructions

## 2017-05-04 NOTE — Progress Notes (Signed)
ANTICOAGULATION CONSULT NOTE - Follow Up Consult  Pharmacy Consult for Heparin Indication: Afib and NSTEMI  No Known Allergies  Patient Measurements: Height: 5' 8"  (172.7 cm) Weight: 277 lb 5.4 oz (125.8 kg) IBW/kg (Calculated) : 68.4 Heparin Dosing Weight:   Vital Signs: Temp: 97.7 F (36.5 C) (06/16 1020) Temp Source: Oral (06/16 0700) BP: 125/68 (06/16 1020) Pulse Rate: 96 (06/16 1020)  Labs:  Recent Labs  05/02/17 0540  05/03/17 0335 05/04/17 0224 05/04/17 1058  HGB 12.0*  --  12.2* 11.9*  --   HCT 39.5  --  39.4 38.2*  --   PLT 104*  --  111* 110*  --   LABPROT  --   --  15.3*  --   --   INR  --   --  1.20  --   --   HEPARINUNFRC 0.26*  < > 0.43 <0.10* 0.41  CREATININE 1.95*  --  1.88* 2.13*  --   < > = values in this interval not displayed.  Estimated Creatinine Clearance: 37.5 mL/min (A) (by C-G formula based on SCr of 2.13 mg/dL (H)).   Assessment:  Anticoag: Anticoag: Heparin for r/o ACS, afib of unknown duration, CHADsVASC score 3 - Hgb 11.9 plts 110 stable, no bleeding noted - admitted with possible stroke but neuro says no acute infarct, hx afib not anticoagulated d/t portal gastropathy, cirrhosis and ongoing EtOH abuse.  (HL <0.1?? This AM) now HL 0.41 back in goal range.   Goal of Therapy:  Heparin level 0.3-0.7 units/ml Monitor platelets by anticoagulation protocol: Yes   Plan:  Heparin 1600 units/hr Daily HL and CBC F/u plts - MD ok with heparin for now PCI 6/15   Beryle Bagsby S. Alford [place], PharmD, BCPS Clinical Staff Pharmacist Pager [phone]  Justin Parsons 05/04/2017,12:54 PM

## 2017-05-05 DIAGNOSIS — Z720 Tobacco use: Secondary | ICD-10-CM

## 2017-05-05 LAB — BASIC METABOLIC PANEL
Anion gap: 8 (ref 5–15)
BUN: 26 mg/dL — AB (ref 6–20)
CALCIUM: 9.2 mg/dL (ref 8.9–10.3)
CHLORIDE: 99 mmol/L — AB (ref 101–111)
CO2: 30 mmol/L (ref 22–32)
CREATININE: 2.21 mg/dL — AB (ref 0.61–1.24)
GFR calc Af Amer: 31 mL/min — ABNORMAL LOW (ref >60)
GFR calc non Af Amer: 27 mL/min — ABNORMAL LOW (ref >60)
Glucose, Bld: 104 mg/dL — ABNORMAL HIGH (ref 65–99)
Potassium: 4.1 mmol/L (ref 3.5–5.1)
Sodium: 137 mmol/L (ref 135–145)

## 2017-05-05 LAB — CBC
HEMATOCRIT: 36.9 % — AB (ref 39.0–52.0)
HEMOGLOBIN: 11.4 g/dL — AB (ref 13.0–17.0)
MCH: 28.7 pg (ref 26.0–34.0)
MCHC: 30.9 g/dL (ref 30.0–36.0)
MCV: 92.9 fL (ref 78.0–100.0)
Platelets: 114 10*3/uL — ABNORMAL LOW (ref 150–400)
RBC: 3.97 MIL/uL — ABNORMAL LOW (ref 4.22–5.81)
RDW: 17 % — AB (ref 11.5–15.5)
WBC: 11.8 10*3/uL — ABNORMAL HIGH (ref 4.0–10.5)

## 2017-05-05 LAB — HEPARIN LEVEL (UNFRACTIONATED): Heparin Unfractionated: 0.67 IU/mL (ref 0.30–0.70)

## 2017-05-05 LAB — POCT ACTIVATED CLOTTING TIME
Activated Clotting Time: 318 seconds
Activated Clotting Time: 340 seconds

## 2017-05-05 MED ORDER — VITAMIN B-1 100 MG PO TABS
100.0000 mg | ORAL_TABLET | Freq: Every day | ORAL | Status: DC
  Administered 2017-05-06: 100 mg via ORAL
  Filled 2017-05-05: qty 1

## 2017-05-05 NOTE — Progress Notes (Signed)
Progress Note  Patient Name: Justin Parsons Date of Encounter: 05/05/2017  Primary Cardiologist: New to [facility] seen by Dr. [place]ique initially (will be followed in [place] as he lives there)  Subjective   No complains. No chest pain or dyspnea. Postop day #2 LAD orbital atherectomy, PCI and drug-eluting stenting with residual CAD treated medically.  Inpatient Medications    Scheduled Meds: . allopurinol  100 mg Oral Daily  . aspirin EC  81 mg Oral Daily  . atorvastatin  80 mg Oral q1800  . carvedilol  3.125 mg Oral BID WC  . clopidogrel  75 mg Oral Daily  . docusate sodium  100 mg Oral BID  . folic acid  1 mg Oral Daily  . levothyroxine  50 mcg Oral QAC breakfast  . pantoprazole  40 mg Oral Daily  . polyethylene glycol  17 g Oral Daily  . predniSONE  20 mg Oral Q breakfast  . sodium chloride flush  3 mL Intravenous Q12H  . sodium chloride flush  3 mL Intravenous Q12H  . thiamine injection  100 mg Intravenous Q24H   Continuous Infusions: . sodium chloride 10 mL/hr at 05/02/17 0300  . sodium chloride    . sodium chloride    . heparin 1,600 Units/hr (05/04/17 2005)   PRN Meds: sodium chloride, sodium chloride, gi cocktail, levalbuterol, LORazepam, ondansetron, RESOURCE THICKENUP CLEAR, sodium chloride flush, sodium chloride flush, traMADol   Vital Signs    Vitals:   05/04/17 1027 05/04/17 1429 05/04/17 2007 05/05/17 0445  BP:  132/86 113/74 109/70  Pulse:  87 95 88  Resp:  16 18 20   Temp:  97.5 F (36.4 C) 97.6 F (36.4 C) 97.4 F (36.3 C)  TempSrc:  Oral Oral Oral  SpO2: 92% 99% 100% 92%  Weight:    274 lb 11.1 oz (124.6 kg)  Height:        Intake/Output Summary (Last 24 hours) at 05/05/17 0933 Last data filed at 05/05/17 0422  Gross per 24 hour  Intake           597.87 ml  Output             2150 ml  Net         -1552.13 ml   Filed Weights   05/03/17 0329 05/04/17 0039 05/05/17 0445  Weight: 283 lb 6.4 oz (128.5 kg) 277 lb 5.4 oz (125.8 kg) 274 lb 11.1  oz (124.6 kg)    Telemetry   afib at rate of 90s, occasional PVCs versus aberrantly conducted beats- Personally Reviewed  ECG    Not performed today - Personally Reviewed  Physical Exam   GEN: No acute distress.   Neck: No JVD Cardiac: RRR, no murmurs, rubs, or gallops. Right radial cath site stable. Respiratory: Clear to auscultation bilaterally. GI: Soft, nontender, non-distended  MS: No edema; No deformity. Neuro:  Nonfocal  Psych: Normal affect   Labs    Chemistry  Recent Labs Lab 05/01/17 0314 05/02/17 0540 05/03/17 0335 05/04/17 0224  NA 137 137 136 135  K 4.5 5.2* 4.9 4.9  CL 107 107 107 102  CO2 22 23 22 28   GLUCOSE 118* 87 82 123*  BUN 39* 30* 26* 25*  CREATININE 2.42* 1.95* 1.88* 2.13*  CALCIUM 8.2* 8.7* 8.8* 9.1  PROT 5.9*  --   --   --   ALBUMIN 3.1*  --   --   --   AST 34  --   --   --  ALT 20  --   --   --   ALKPHOS 59  --   --   --   BILITOT 1.1  --   --   --   GFRNONAA 24* 31* 33* 28*  GFRAA 28* 36* 38* 33*  ANIONGAP 8 7 7 5      Hematology  Recent Labs Lab 05/03/17 0335 05/04/17 0224 05/05/17 0231  WBC 8.5 8.8 11.8*  RBC 4.20* 4.10* 3.97*  HGB 12.2* 11.9* 11.4*  HCT 39.4 38.2* 36.9*  MCV 93.8 93.2 92.9  MCH 29.0 29.0 28.7  MCHC 31.0 31.2 30.9  RDW 17.1* 17.3* 17.0*  PLT 111* 110* 114*    Cardiac Enzymes  Recent Labs Lab 04/28/17 1409 04/28/17 1930  TROPONINI 40.36* 35.27*   No results for input(s): TROPIPOC in the last 168 hours.   BNPNo results for input(s): BNP, PROBNP in the last 168 hours.   DDimer No results for input(s): DDIMER in the last 168 hours.   Radiology    No results found.  Cardiac Studies   Echo 04/29/15 Study Conclusions  - Left ventricle: Endocardium not well seen. The cavity size was   normal. Wall thickness was increased in a pattern of severe LVH.   Systolic function was normal. The estimated ejection fraction was   in the range of 50% to 55%. The study is not technically   sufficient  to allow evaluation of LV diastolic function. - Aortic valve: Calcified non coronary cusp. - Mitral valve: Calcfied anterior leaflet Calcified annulus. There   was mild regurgitation. - Left atrium: The atrium was moderately dilated. - Atrial septum: No defect or patent foramen ovale was identified. - Pulmonary arteries: PA peak pressure: 49 mm Hg (S).  Cath 05/01/17 Left Heart Cath and Coronary Angiography  Conclusion     Ost LAD lesion, 85 %stenosed - apparently thrombotic with TIMI 2 flow distally  1st Diag lesion(major trunk of diagonal), 100 %stenosed.  1st Mrg lesion, 100 %stenosed.  Extensively diseased RCA that is a very large caliber vessel/ectatic with extensive thrombus in the proximal to mid portion with reduced flow distally  Prox RCA to Mid RCA lesion, 75 %stenosed. heavily thrombotic, ulcerated irregular vessel  Mid RCA to Dist RCA lesion, 60 %stenosed. Dist RCA to tandem lesions, 70 %stenosed.  Post Atrio lesion, 80 %stenosed - also appears to be ulcerated and thrombotic with some ectatic segments.  HEMODYNAMICS  LV end diastolic pressure is mildly elevated.  There is no aortic valve stenosis.   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 The 2 occluded vessels being diagonal branch and OM are not very large caliber vessels, however diagonal branch does appear to have the potential cover large area of distribution.  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 He would not be a good candidate for PCI as this would require extensive stents, and elevate the LAD lesion is amenable to PCI. I will restart IV heparin for atrial fibrillation (I think were too far out to start Aggrastat).   The case was discussed with Dr. Debara Pickett who will contact CT surgery  for consultation to determine his appropriateness for CABG   Coronary Stent Intervention  05/03/17  Coronary Atherectomy  Conclusion     Ost LAD lesion, 85 %stenosed.  A STENT SYNERGY DES 4X12 drug eluting stent was successfully placed. Post-dilated to 5.1  mm  Post intervention, there is a 0% residual stenosis.  1st Diag lesion, 100 %stenosed. unable to cross with wire  1st Mrg lesion, 100 %stenosed.  Severely elevated LVEDP of 36 mmHg   Successful atherectomy based PCI of ostial LAD landing a stent just within the Left Main but not encroaching upon the Circumflex. The stent was postdilated up to 5.1 mm..  The patient required significant amount of contrast simply because of his body habitus and poor visibility trying to place an ostial stent.  An attempt was made to wire the occluded branch of the first diagonal. The guidewire would not cross. However there did appear to be some faint filling further down than the original occlusion site on post-PCI angiography.  Plan: The patient is being transferred to 6 Central post procedure unit for TR band removal and ongoing monitoring. I'll write for least one dose of IV Lasix to be given this evening. He may require further dosing based on his elevated EDP.  He will be given gentle hydration given his CK D.  Plan for now will be to treat the occluded OM branch and diagonal branch medically. I was unable to engage the RCA which was the initial plan.  This can be evaluated at a later date.  He will need to be on aspirin plus Plavix for at least one year. Would not do triple therapy based on his bleeding risk.   Diagnostic Diagram       Post-Intervention Diagram          Patient Profile     76 yo WM w/ h/o cirrhosis of the liver, CKD, HTN, cirrhosis,and h/o TIA, transferred from ED at [facility] for evaluation of chest pain, hypotension and bradycardia. 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   He was noted to be bradycardic with junctional rhythm and Afib. He was hypotensive with BP of 63/49. Given atropine IV and fluids. Stat CT of chest/abdomen/pelvis obtained which showed no acute process/ dissection/or PE. Also with AKI with admission troponin up.   Assessment & Plan    1. NSTEMI - Peak of troponin 40.36. Treated with IV heparin.  Echo with normal LVEF at 50-55%. 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 Seen by CTCA and did not felt good candidate for CABG. Case was reviewed with Dr. Burt Knack who recommended atherectomy and stenting of the ostial LAD which will require stenting back into the left main and possibly bifurcational approach if the circumflex is compromised.  - S/p Successful atherectomy based PCI of ostial LAD landing a stent just within the Left Main but not encroaching upon the Circumflex. The stent was postdilated up to 5.1 mm. Unable to cross wire across diag branch. Unable to engage the RCA which was the initial plan due to large contrast was used.  This can be evaluated at a later date.  - Continue ASA, Plavix , BB and statin.   2. Atrial fibrillation likely chronic - Rate relatively controlled. Plan to treated with DAPT therapy as his risk of bleeding in the setting of cirrhosis and portal HTN is excessive. Tolerating IV heparin well here.   3. Acute on CKD, stage III - Baseline around 2.1. On admission 3.36 that improved to 1.88 yesterday. Post PCI (large contrast used yesterday) Scr of 2.13 today. Follow closely.  4. Liver cirrhosis with portal gastropathy  5. Justin Ahmadi, MD  05/05/2017, 9:33 AM    Agree with note by  Vin Bhagat PA-C  Patient had non-STEMI on admission although his troponins rose to 40. He has what appears to be chronic A. fib, chronic renal insufficiency, cirrhosis with portal hypertension. He underwent angiography by Dr. Ellyn Hack revealing three-vessel disease. He has preserved LV function. Because of his  renal deficiency and the complexity of his coronary anatomy he had staged high-risk orbital atherectomy, PCI and drug-eluting stenting of the ostial LAD with an excellent angiographic result. The stent was postdilated 5.1 mm. He did have residual disease with plans on treating conservatively at this time. He denies chest pain or shortness of breath. His right radial puncture site is well-healed. Given his portal hypertension I agree the triple therapy would be high-risk for bleeding despite his atrial fibrillation. I suggest to antiplatelet therapy for a month followed by discontinuation of aspirin and beginning a novel oral anticoagulant at that time. He lives in [place] and therefore following up with one of our [facility] cardiologist would be easier for the patient and compliance as well. He has not ambulated and continues to have a Foley catheter. He has gotten out of bed into a chair. I suggest discontinuing the Foley, cardiac rehabilitation. I'm not sure that he is able to be discharged home where he lives alone and has little family support. He may benefit from an interim rehabilitation facility.  Lorretta Harp, M.D., [place], [facility], [place] [address] [place], [place]  [address]  [phone] 05/05/2017 9:33 AM

## 2017-05-05 NOTE — Progress Notes (Signed)
Foley pulled at 11 AM, pt has not urinated yet. Bladder scan revealed 0 ml. MD on call notified.  No new orders given, will continue to monitor.    Justin Parsons M

## 2017-05-05 NOTE — Progress Notes (Signed)
Pt voided 225 ml of urine.

## 2017-05-05 NOTE — Progress Notes (Signed)
[facility] PROGRESS NOTE  Justin Parsons JYN:[id number] DOB: 05-26-1941 DOA: 04/27/2017 PCP: Monico Blitz, MD   Interim summary and history of present illness  76 year old man with a notable history of chronic kidney disease stage III, alcoholism, hypertension, hypothyroidism, gout and GERD; who was admitted secondary to presyncope while working outside. Patient was found with acute on chronic renal failure and circulatory shock suspected to be secondary to NSTEMI.   Assessment/Plan: 1-AMS/acute encephalopathy/aphasia -Most likely associated with shock brain due to hypotension and decreased perfusion with circulatory shock; also considering association to alcohol and the use of Neurontin in the presence of acute renal failure. -patient with sundowning overnight; most likely associated with steroids now. This morning AAOX3. Will continue constant reorientation.  -no evidence of stroke radiographically  -patient also with EEG that didn't demonstrate epileptic abnormalities   -VSS has remained stable and patient is off pressors or any support -was on precedex on admission, now off and stable overall.   2-NSTEMI -cardiology on board -patient was found not a candidate for CABG; multiple vessel disease appreciated on left heart cath on 6/13 -s/p repeat cath and PCI on 6/15 -will continue ASA, plavix, B-blockers and statins -also on heparin as per cardiology rec's; most likely will be able to stop today.  3-acute on chronic renal failure stage III at baseline: Creatinine in the range of 2.1 about 3 years ago -Cr went up after cath; 2.13 last; labs pending today -will monitor closely and avoid nephrotoxic agents if possible. -patient reporting good urine output -no dysuria -continue minimizing/avoiding nephrotoxic agents   4-atrial fibrillation: unknown duration, but most likely chronic  -CHADsVASC score 3 -patient will need long term anticoagulation (maybe eliquis at some point);  will follow cardiology rec's -continue telemetry monitoring  -started on low dose coreg on 6/15 and well tolerated. -continue monitoring and will adjust medications as needed   5-alcohol abuse and prior hx of liver cirrhosis with portal gastropathy  -once again, cessation counseling and absolute abstinence encouraged/advised today 6/17 -LFT's and INR has remained WNL; will recheck in am -no signs of active bleeding appreciated  -will continue PPI  6-thrombocytopenia -counts has remained stable.  -no signs of acute bleeding  -will continue monitoring of CBC (especially while on heparin) -most likely associated with alcohol use and underlying mild cirrhosis  7-circulatory shock/hypotension -due to NSTEMI -troponin > 40 -patient has remained chest pain free -patient had left heart cath demonstrating multiple vessel disease and not a candidate for CABG -PCI on 6/15, tolerated procedure well and has remained hemodynamically stable   -will continue aspirin, plavix, statin and heparin for now -will follow cardiology rec's   8-physical deconditioning  -planning SNF at discharge -SW is aware and if stable from cardiology stand point, will pursuit transfer to facility tomorrow.   9-Gout -some improvement reported -will continue prednisone and allopurinol  10-constipation -continue miralax and colace   Code Status: Full code Family Communication: No family at bedside Disposition Plan: status post PCI (overall well tolerated). Patient has kept on heparin drip as per cardiology rec's; follow renal function.  Consultants:  PCCM  Cardiology   Cardiothoracic surgery  Neurology   Procedures:  See below for x-ray reports  2-D echo - Left ventricle: Endocardium not well seen. The cavity size was normal. Wall thickness was increased in a pattern of severe LVH. Systolic function was normal. The estimated ejection fraction was in the range of 50% to 55%. The study is not  technically sufficient to allow evaluation  of LV diastolic function. - Aortic valve: Calcified non coronary cusp. - Mitral valve: Calcfied anterior leaflet Calcified annulus. There was mild regurgitation. - Left atrium: The atrium was moderately dilated. - Atrial septum: No defect or patent foramen ovale was identified. - Pulmonary arteries: PA peak pressure: 49 mm Hg (S).  Left heart cath: Multiple vessels are a stenosis and thrombotic, not a good candidate for PCI therapy. Cardiology has discussed with cardiothoracic surgeon for evaluation regarding CABG; not a good candidate for CABG.   Left heart cath repeated with PCI intervention on 6/15  Antibiotics:  None  HPI/Subjective: No CP, no SOB, no nausea, no vomiting. Patient is still on heparin drip as per cardiology rec's. Sundowning reported yesterday. Still complaining of joint pain.  Objective: Vitals:   05/04/17 2007 05/05/17 0445  BP: 113/74 109/70  Pulse: 95 88  Resp: 18 20  Temp: 97.6 F (36.4 C) 97.4 F (36.3 C)    Intake/Output Summary (Last 24 hours) at 05/05/17 0848 Last data filed at 05/05/17 0422  Gross per 24 hour  Intake           837.87 ml  Output             2650 ml  Net         -1812.13 ml   Filed Weights   05/03/17 0329 05/04/17 0039 05/05/17 0445  Weight: 128.5 kg (283 lb 6.4 oz) 125.8 kg (277 lb 5.4 oz) 124.6 kg (274 lb 11.1 oz)    Exam:   General: no fever, no CP and endorses breathing is stab;e. Still using oxygen supplementation (especially on exertion). Patient continue to have pain in his joints, but endorses improvement and increase range of motion. No bleeding appreciated/reported. Some sundowning reported yesterday (most likely associated with use of prednisone); oriented X 3 currently.  Cardiovascular: rate controlled; A. Fib on telemetry. There is no JVD, no murmurs, no gallops on exam.   Respiratory: no crackles. No wheezing, good air movement, no using accessory  muscles.  Abdomen: obese. Soft, NT, ND, positive BS  Musculoskeletal: trace edema bilaterally appreciated. Stiffness on his ankles and complaining of knees and hands pain. No cyanosis or clubbing appreciated.  Data Reviewed: Basic Metabolic Panel:  Recent Labs Lab 04/29/17 0043  04/30/17 0149 05/01/17 0314 05/02/17 0540 05/03/17 0335 05/04/17 0224  NA 136  < > 137 137 137 136 135  K 4.7  < > 4.5 4.5 5.2* 4.9 4.9  CL 104  < > 108 107 107 107 102  CO2 24  < > 22 22 23 22 28   GLUCOSE 114*  < > 82 118* 87 82 123*  BUN 49*  < > 40* 39* 30* 26* 25*  CREATININE 3.41*  < > 2.42* 2.42* 1.95* 1.88* 2.13*  CALCIUM 8.3*  < > 8.3* 8.2* 8.7* 8.8* 9.1  MG 1.9  --   --  1.9  --   --   --   PHOS 4.6  --   --  3.3  --   --   --   < > = values in this interval not displayed. Liver Function Tests:  Recent Labs Lab 05/01/17 0314  AST 34  ALT 20  ALKPHOS 59  BILITOT 1.1  PROT 5.9*  ALBUMIN 3.1*   CBC:  Recent Labs Lab 04/30/17 1547 05/01/17 0314 05/02/17 0540 05/03/17 0335 05/04/17 0224 05/05/17 0231  WBC 10.3 9.3 9.2 8.5 8.8 11.8*  NEUTROABS 8.1*  --   --   --   --   --  HGB 12.3* 12.3* 12.0* 12.2* 11.9* 11.4*  HCT 39.4 39.3 39.5 39.4 38.2* 36.9*  MCV 92.7 92.9 94.0 93.8 93.2 92.9  PLT 115* 99* 104* 111* 110* 114*   Cardiac Enzymes:  Recent Labs Lab 04/28/17 1409 04/28/17 1930 04/29/17 1038  CKTOTAL  --   --  299  CKMB  --   --  12.8*  TROPONINI 40.36* 35.27*  --    CBG: No results for input(s): GLUCAP in the last 168 hours.  Recent Results (from the past 240 hour(s))  MRSA PCR Screening     Status: None   Collection Time: 04/27/17  6:38 PM  Result Value Ref Range Status   MRSA by PCR NEGATIVE NEGATIVE Final    Comment:        The GeneXpert MRSA Assay (FDA approved for NASAL specimens only), is one component of a comprehensive MRSA colonization surveillance program. It is not intended to diagnose MRSA infection nor to guide or monitor treatment  for MRSA infections.      Studies: No results found.  Scheduled Meds: . allopurinol  100 mg Oral Daily  . aspirin EC  81 mg Oral Daily  . atorvastatin  80 mg Oral q1800  . carvedilol  3.125 mg Oral BID WC  . clopidogrel  75 mg Oral Daily  . docusate sodium  100 mg Oral BID  . folic acid  1 mg Oral Daily  . levothyroxine  50 mcg Oral QAC breakfast  . pantoprazole  40 mg Oral Daily  . polyethylene glycol  17 g Oral Daily  . predniSONE  20 mg Oral Q breakfast  . sodium chloride flush  3 mL Intravenous Q12H  . sodium chloride flush  3 mL Intravenous Q12H  . thiamine injection  100 mg Intravenous Q24H   Continuous Infusions: . sodium chloride 10 mL/hr at 05/02/17 0300  . sodium chloride    . sodium chloride    . heparin 1,600 Units/hr (05/04/17 2005)      ARF (acute renal failure) (HCC)   CKD stage 3   A-fib (HCC)   Acute encephalopathy   Confusion   Non-ST elevation (NSTEMI) myocardial infarction [facility])   Shock circulatory ([place])    Time spent: 35 minutes   Barton Dubois  [facility] Pager [phone] If 7PM-7AM, please contact night-coverage at [email or web address], password [facility] 05/05/2017, 8:48 AM  LOS: 8 days

## 2017-05-06 ENCOUNTER — Encounter (HOSPITAL_COMMUNITY): Payer: Self-pay | Admitting: Cardiology

## 2017-05-06 DIAGNOSIS — K7030 Alcoholic cirrhosis of liver without ascites: Secondary | ICD-10-CM

## 2017-05-06 LAB — CBC
HEMATOCRIT: 36.8 % — AB (ref 39.0–52.0)
HEMOGLOBIN: 11.5 g/dL — AB (ref 13.0–17.0)
MCH: 28.8 pg (ref 26.0–34.0)
MCHC: 31.3 g/dL (ref 30.0–36.0)
MCV: 92.2 fL (ref 78.0–100.0)
Platelets: 132 10*3/uL — ABNORMAL LOW (ref 150–400)
RBC: 3.99 MIL/uL — AB (ref 4.22–5.81)
RDW: 16.6 % — ABNORMAL HIGH (ref 11.5–15.5)
WBC: 11.9 10*3/uL — ABNORMAL HIGH (ref 4.0–10.5)

## 2017-05-06 LAB — HEPARIN LEVEL (UNFRACTIONATED): Heparin Unfractionated: <0.1 IU/mL — ABNORMAL LOW (ref 0.30–0.70)

## 2017-05-06 MED ORDER — FOLIC ACID 1 MG PO TABS: 1.0000 mg | ORAL_TABLET | Freq: Every day | ORAL | Status: DC

## 2017-05-06 MED ORDER — TRAMADOL HCL 50 MG PO TABS: 50.0000 mg | ORAL_TABLET | Freq: Three times a day (TID) | ORAL | 0 refills | Status: DC | PRN

## 2017-05-06 MED ORDER — DOCUSATE SODIUM 100 MG PO CAPS: 100.0000 mg | ORAL_CAPSULE | Freq: Two times a day (BID) | ORAL | Status: DC

## 2017-05-06 MED ORDER — CARVEDILOL 6.25 MG PO TABS: 6.2500 mg | ORAL_TABLET | Freq: Two times a day (BID) | ORAL | Status: DC

## 2017-05-06 MED ORDER — PREDNISONE 20 MG PO TABS: 20.0000 mg | ORAL_TABLET | Freq: Every day | ORAL | Status: DC

## 2017-05-06 MED ORDER — THIAMINE HCL 100 MG PO TABS: 100.0000 mg | ORAL_TABLET | Freq: Every day | ORAL | Status: DC

## 2017-05-06 MED ORDER — ATORVASTATIN CALCIUM 80 MG PO TABS: 80.0000 mg | ORAL_TABLET | Freq: Every day | ORAL | Status: DC

## 2017-05-06 MED ORDER — POLYETHYLENE GLYCOL 3350 17 G PO PACK: 17.0000 g | PACK | Freq: Every day | ORAL | Status: DC | PRN

## 2017-05-06 MED ORDER — CLOPIDOGREL BISULFATE 75 MG PO TABS: 75.0000 mg | ORAL_TABLET | Freq: Every day | ORAL | Status: DC

## 2017-05-06 MED ORDER — ASPIRIN 81 MG PO TBEC: 81.0000 mg | DELAYED_RELEASE_TABLET | Freq: Every day | ORAL | Status: DC

## 2017-05-06 NOTE — Care Management Note (Signed)
Case Management Note Previous CM note initiated by Justin Labrador, RN 05/02/2017, 9:30 AM   Patient Details  Name: Justin Parsons MRN: [id number] Date of Birth: [birth year]  Subjective/Objective:   Pt admitted with acute encep                  Action/Plan:   PTA independent from home.  ETOH - will consult CSW   Expected Discharge Date:  05/06/17               Expected Discharge Plan:  Skilled Nursing Facility  In-House Referral:  Clinical Social Work  Discharge planning Services  CM Consult  Post Acute Care Choice:    Choice offered to:     DME Arranged:    DME Agency:     HH Arranged:    [place] Agency:     Status of Service:  Completed, signed off  If discussed at [employer], dates discussed:    Additional Comments:  05/06/17- 1420- Justin Jacuinde RN, CM- pt s/p PCI and DES- note plan for Plavix and ASA-  CSW following for SNF placement- pt stable for d/c to SNF today - [facility].    Justin Labrador, RN 05/02/2017, 9:30 AM--SNF recommended - CSW consulted.  Pt remains on heparin drip and not candidate for CABG.     Justin Parsons [place], RN 05/06/2017, 2:27 PM [phone]

## 2017-05-06 NOTE — Progress Notes (Signed)
Pt discharge education and instructions completed. Pt discharge to [facility] SN[facility]) and report called off to nurse Jacquenette Shone at the facility. Pt IV and telemetry removed; pt awaiting on PTAR to be transported off unit to disposition. Right radial remains level 0 at discharge. Will continue to monitor pt closely till pt pick up. P. Angelica Pou RN

## 2017-05-06 NOTE — Clinical Social Work Note (Signed)
Clinical Social Work Assessment  Patient Details  Name: Justin Parsons MRN: [id number] Date of Birth: [birth year]  Date of referral:  05/06/17               Reason for consult:  Discharge Planning, Facility Placement                Permission sought to share information with:  Family Supports Permission granted to share information::  Yes, Verbal Permission Granted  Name::     Bertram Haddix  Agency::  [facility]  Relationship::  relative  Contact Information:  [phone]  Housing/Transportation Living arrangements for the past 2 months:  [place] of Information:  Patient Patient Interpreter Needed:  None Criminal Activity/Legal Involvement Pertinent to Current Situation/Hospitalization:  No - Comment as needed Significant Relationships:  Other Family Members, Friend Lives with:  Self, Friends Do you feel safe going back to the place where you live?  Yes Need for family participation in patient care:  No (Coment)  Care giving concerns:  No family at bedside. Patient stated he lives in a one bedroom place and has his worker living near him. Patient stated he has family in the area  Social Worker assessment / plan:  Clinical Social Worker met patient at bedside to offer support and discuss SNF placement. Patient stated he has been in a SNF before and if he has to go back he will. Patient stated he would prefer [facility] in [place] since its close to home. CSW reached out to admissions coordinator Mardene Celeste and she stated they are able to take patient at facility.  Employment status:    Insurance information:  Medicare PT Recommendations:  [place] / Referral to community resources:  [place]  Patient/Family's Response to care:  Patient verbalized understanding for CSW role and involvement in care. Patient agreeable to go to SNF and would prefer [facility]  Patient/Family's Understanding of and Emotional Response to Diagnosis, Current Treatment, and Prognosis: Patient understands that he needs more rehab before returning back home   Emotional Assessment Appearance:  Appears stated age Attitude/Demeanor/Rapport:  Other (appropriate) Affect (typically observed):  Appropriate Orientation:  Oriented to Place, Oriented to  Time, Oriented to Situation, Oriented to Self Alcohol / Substance use:  Alcohol Use Psych involvement (Current and /or in the community):  No (Comment)  Discharge Needs  Concerns to be addressed:  No discharge needs identified Readmission within the last 30 days:  No Current discharge risk:  Physical Impairment Barriers to Discharge:  No Barriers Identified   Wende Neighbors, LCSW 05/06/2017, 11:56 AM

## 2017-05-06 NOTE — Progress Notes (Signed)
Pt picked up by PT[facility] via stretcher. He's alert and oriented.

## 2017-05-06 NOTE — Discharge Summary (Signed)
Physician Discharge Summary  Justin Parsons QIW:[id number] DOB: [birth year] DOA: 04/27/2017  PCP: Monico Blitz, MD  Admit date: 04/27/2017 Discharge date: 05/06/2017  Time spent: 35 minutes  Recommendations for Outpatient Follow-up:  Repeat CBC to follow platelets count and Hgb trend Repeat BMET in 5 days to follow electrolytes and renal function  Please repeat LFT's and PT/INR in 2 weeks to follow hepatic function and INR level; given use of dual therapy and hx of cirrhosis  In 1 month, stop aspirin and start eliquis    Discharge Diagnoses:  Active Problems:   ARF (acute renal failure) (HCC)   A-fib (HCC)   Acute diastolic CHF (congestive heart failure) (HCC)   Acute encephalopathy   Confusion   Non-ST elevation (NSTEMI) myocardial infarction (HCC)   Delirium tremens (HCC)   Shock circulatory (HCC)   Tobacco abuse   Acute renal failure superimposed on stage 3 chronic kidney disease (HCC)   Alcohol use disorder (HCC)   Coronary artery disease involving native coronary artery of native heart with angina pectoris [facility])   Discharge Condition: stable and improved. Will discharge to SNF for rehabilitation. Follow up with PCP in 2 weeks after discharge from facility and follow up with cardiology service as instructed.  Diet recommendation: heart healthy and low sodium diet.  Filed Weights   05/04/17 0039 05/05/17 0445 05/06/17 0504  Weight: 125.8 kg (277 lb 5.4 oz) 124.6 kg (274 lb 11.1 oz) 125.9 kg (277 lb 8 oz)    Brief History of present illness:   76 year old man with a notable history of chronic kidney disease stage III, alcoholism, hypertension, hypothyroidism, gout and GERD; who was admitted secondary to presyncope while working outside. Patient was found with acute on chronic renal failure and circulatory shock suspected to be secondary to NSTEMI.   Hospital Course:  1-AMS/acute encephalopathy/aphasia -Most likely associated with shock brain due to hypotension and  decreased perfusion with circulatory shock; also considering association to alcohol and the use of Neurontin in the presence of acute renal failure. -mentation has now resolved and remained stable; AAOX3 -no evidence of stroke radiographically  -patient also with EEG that didn't demonstrate epileptic abnormalities   -VSS has remained stable and patient is off pressors or any other support over 96 hours -was on precedex on admission, now off and stable overall.   2-NSTEMI -cardiology on board -patient was found not a candidate for CABG; multiple vessel disease appreciated on left heart cath on 6/13 -s/p repeat cath and PCI on 6/15 -will continue ASA, plavix, B-blockers and statins -also on heparin as per cardiology rec's  3-acute on chronic renal failure stage III at baseline: Creatinine in the range of 2.1 about 3 years ago -Cr went up after cath; 2.2 -will recommend close monitoring -patient reports good urine output -no dysuria reported  -continue minimizing/avoiding nephrotoxic agents   4-atrial fibrillation: unknown duration, but most likely chronic  -CHADsVASC score 3 -patient will need long term anticoagulation (plan is for eliquis in 1 month after stopping aspirin at that time); will follow further cardiology rec's -started on low dose coreg on 6/15, so far well tolerated   5-alcohol abuse and prior hx of liver cirrhosis with portal gastropathy  -cessation counseling and absolute abstinence encouraged/advised multiple times -LFT's and INR has remained WNL -no signs of active bleeding appreciated  -will continue PPI twice a day   6-thrombocytopenia -no signs of acute bleeding  -will recommend CBC at follow up with Check Hgb trend and platelets count -most  likely associated with alcohol use and underlying mild cirrhosis  7-circulatory shock/hypotension -secondary to NSTEMI -troponin > 40 -chest pain free -patient had left heart cath on 6/13 demonstrating multiple  vessel disease and was found not a candidate for CABG -PCI on 6/15, tolerated procedure well and has remained hemodynamically stable   -continue aspirin, plavix for now; in month plan is to drop aspirin and continue plavix and start novel anticoagulation. (eliquis) -will follow up with cardiology in [place] as an outpatient   8-physical deconditioning  -will discharge to SNF for rehabilitation   9-Gout -continue allopurinol and prednisone -improving -plan is to treat for 5 days with prednisone   10-constipation -will continue PRN use miralax and continue colace -advise to maintain adequate hydration -had BM prior to discharge   Procedures:  See below for x-ray reports  2-D echo - Left ventricle: Endocardium not well seen. The cavity size was normal. Wall thickness was increased in a pattern of severe LVH. Systolic function was normal. The estimated ejection fraction was in the range of 50% to 55%. The study is not technically sufficient to allow evaluation of LV diastolic function. - Aortic valve: Calcified non coronary cusp. - Mitral valve: Calcfied anterior leaflet Calcified annulus. There was mild regurgitation. - Left atrium: The atrium was moderately dilated. - Atrial septum: No defect or patent foramen ovale was identified. - Pulmonary arteries: PA peak pressure: 49 mm Hg (S).  Left heart cath: Multiple vessels are a stenosis and thrombotic, not a good candidate for PCI therapy. Cardiology has discussed with cardiothoracic surgeon for evaluation regarding CABG; not a good candidate for CABG.   Left heart cath repeated with PCI intervention on 6/15  Consultations:  Cardiology  PCCM  Neurology  Cardiothoracic surgery   Discharge Exam: Vitals:   05/06/17 0504 05/06/17 0832  BP: 121/73 111/71  Pulse: 77 83  Resp: 18 16  Temp: 97.5 F (36.4 C) 98.5 F (36.9 C)    General: afebrile, no CP and reports breathing is ok. Continue to have disseminated  joint pain, but endorses some improvement with use of prednisone. Denies signs of acute bleeding and was able to pee w/o foley and had BM prior to discharge.  Cardiovascular: rate controlled, no JVD, no rubs, no gallops  Respiratory: no wheezing, no crackles, no using accessory muscles   Abdomen:  Obese, soft, NT, ND, positive BS  Musculoskeletal: trace edema bilaterally, no cyanosis and no clubbing; some stiffness in his hand and ankles appreciated on exam.   Discharge Instructions   Discharge Instructions    (HEART FAILURE PATIENTS) Call MD:  Anytime you have any of the following symptoms: 1) 3 pound weight gain in 24 hours or 5 pounds in 1 week 2) shortness of breath, with or without a dry hacking cough 3) swelling in the hands, feet or stomach 4) if you have to sleep on extra pillows at night in order to breathe.    Complete by:  As directed    AMB Referral to Cardiac Rehabilitation - Phase II    Complete by:  As directed    Diagnosis:   NSTEMI Coronary Stents     Diet - low sodium heart healthy    Complete by:  As directed    Discharge instructions    Complete by:  As directed    Maintain adequate hydration Repeat CBC to follow platelets count and Hgb trend Repeat BMET in 5 days to follow electrolytes and renal function  Please repeat LFT's and PT/INR  in 2 weeks to follow hepatic function and INR level; given use of dual therapy and hx of cirrhosis  In 1 months, stop aspirin and start eliquis  Rehabilitation as per nursing facility protocol     Current Discharge Medication List    START taking these medications   Details  atorvastatin (LIPITOR) 80 MG tablet Take 1 tablet (80 mg total) by mouth daily at 6 PM.    carvedilol (COREG) 6.25 MG tablet Take 1 tablet (6.25 mg total) by mouth 2 (two) times daily with a meal.    clopidogrel (PLAVIX) 75 MG tablet Take 1 tablet (75 mg total) by mouth daily.    docusate sodium (COLACE) 100 MG capsule Take 1 capsule (100 mg total)  by mouth 2 (two) times daily.    folic acid (FOLVITE) 1 MG tablet Take 1 tablet (1 mg total) by mouth daily.    polyethylene glycol (MIRALAX / GLYCOLAX) packet Take 17 g by mouth daily as needed for mild constipation.    predniSONE (DELTASONE) 20 MG tablet Take 1 tablet (20 mg total) by mouth daily with breakfast. For 5 days    thiamine 100 MG tablet Take 1 tablet (100 mg total) by mouth daily.      CONTINUE these medications which have CHANGED   Details  aspirin EC 81 MG EC tablet Take 1 tablet (81 mg total) by mouth daily. Treatment for one month only.    traMADol (ULTRAM) 50 MG tablet Take 1 tablet (50 mg total) by mouth every 8 (eight) hours as needed for severe pain. Qty: 15 tablet, Refills: 0      CONTINUE these medications which have NOT CHANGED   Details  allopurinol (ZYLOPRIM) 100 MG tablet Take 100 mg by mouth daily.      Ascorbic Acid (VITAMIN C PO) Take 1 tablet by mouth daily.    diclofenac (VOLTAREN) 75 MG EC tablet Take 75 mg by mouth 2 (two) times daily.    levothyroxine (SYNTHROID, LEVOTHROID) 50 MCG tablet Take 50 mcg by mouth daily.    omeprazole (PRILOSEC) 40 MG capsule Take 1 capsule (40 mg total) by mouth 2 (two) times daily. Qty: 60 capsule, Refills: 5      STOP taking these medications     atenolol (TENORMIN) 50 MG tablet      cephALEXin (KEFLEX) 500 MG capsule      furosemide (LASIX) 40 MG tablet      amLODipine (NORVASC) 2.5 MG tablet      oxymetazoline (AFRIN) 0.05 % nasal spray        No Known Allergies  Contact information for follow-up providers    Satira Sark, MD Follow up.   Specialty:  Cardiology Why:  On July 5th at 2:00 for cardiology hospital follow up.  Contact information: [place] [address] [phone]        Monico Blitz, MD. Schedule an appointment as soon as possible for a visit in 2 week(s).   Specialty:  Internal Medicine Why:  after discharge from facility  Contact information: [place] [address] [phone]            Contact information for after-discharge care    [facility] Follow up.   Specialty:  [place] information: [address] [place] [phone]                  The results of  significant diagnostics from this hospitalization (including imaging, microbiology, ancillary and laboratory) are listed below for reference.    Significant Diagnostic Studies: Dg Shoulder 1v Right  Result Date: 04/30/2017 CLINICAL DATA:  right shoulder tenderness for several days. EXAM: RIGHT SHOULDER - 1 VIEW COMPARISON:  None. FINDINGS: Moderate degenerative changes are noted involving the acromioclavicular and glenohumeral joint. There is no fracture or subluxation identified. No radiopaque foreign bodies are soft tissue calcifications. IMPRESSION: 1. Acromioclavicular and glenohumeral joint osteoarthritis. Electronically Signed   By: Kerby Moors M.D.   On: 04/30/2017 10:05   Mr Jodene Nam Head Wo Contrast  Result Date: 04/27/2017 CLINICAL DATA:  Acute onset of altered mental status and aphasia. EXAM: MRI HEAD WITHOUT CONTRAST MRA HEAD WITHOUT CONTRAST TECHNIQUE: Multiplanar, multiecho pulse sequences of the brain and surrounding structures were obtained without intravenous contrast. Angiographic images of the head were obtained using MRA technique without contrast. COMPARISON:  CT head without contrast from the same day. FINDINGS: MRI HEAD FINDINGS Brain: The diffusion-weighted images demonstrate no acute or subacute infarction. The study is moderately degraded by patient motion. This limits detection of small lesions. Mild to moderate periventricular white matter changes are noted bilaterally. The brainstem and cerebellum are normal. Vascular: Flow is present in the major intracranial arteries release the level of the MCA bifurcations and basilar tip. Skull and upper cervical  spine: The skullbase is within normal limits. The craniocervical junction is unremarkable. Sinuses/Orbits: A remote left orbital floor fracture is noted. The paranasal sinuses are otherwise clear. A prosthetic right globe is noted. MRA HEAD FINDINGS Time-of-flight images are significantly degraded by patient motion. Flow is present in the internal carotid artery is new the MCA bifurcations bilaterally. Flow is present in both vertebral arteries the basilar tip. Branch vessel signal is obscured by patient motion. IMPRESSION: 1. No acute infarct. 2. Mild-to-moderate white matter disease likely reflects the sequela of chronic microvascular ischemia. 3. The study is moderately degraded by patient motion. 4. No significant proximal stenosis or occlusion at the circle of Willis. Branch vessel evaluation cannot be performed secondary to significant patient motion on the time-of-flight images. These results were discussed in person at the time of interpretation on 04/27/2017 at 8:35 Pm to Dr. Roland Rack , who verbally acknowledged these results. Electronically Signed   By: San Morelle M.D.   On: 04/27/2017 21:09   Mr Brain Wo Contrast  Result Date: 04/27/2017 CLINICAL DATA:  Acute onset of altered mental status and aphasia. EXAM: MRI HEAD WITHOUT CONTRAST MRA HEAD WITHOUT CONTRAST TECHNIQUE: Multiplanar, multiecho pulse sequences of the brain and surrounding structures were obtained without intravenous contrast. Angiographic images of the head were obtained using MRA technique without contrast. COMPARISON:  CT head without contrast from the same day. FINDINGS: MRI HEAD FINDINGS Brain: The diffusion-weighted images demonstrate no acute or subacute infarction. The study is moderately degraded by patient motion. This limits detection of small lesions. Mild to moderate periventricular white matter changes are noted bilaterally. The brainstem and cerebellum are normal. Vascular: Flow is present in the major  intracranial arteries release the level of the MCA bifurcations and basilar tip. Skull and upper cervical spine: The skullbase is within normal limits. The craniocervical junction is unremarkable. Sinuses/Orbits: A remote left orbital floor fracture is noted. The paranasal sinuses are otherwise clear. A prosthetic right globe is noted. MRA HEAD FINDINGS Time-of-flight images are significantly degraded by patient motion. Flow is present in the internal carotid artery is new the MCA bifurcations bilaterally. Flow is present  in both vertebral arteries the basilar tip. Branch vessel signal is obscured by patient motion. IMPRESSION: 1. No acute infarct. 2. Mild-to-moderate white matter disease likely reflects the sequela of chronic microvascular ischemia. 3. The study is moderately degraded by patient motion. 4. No significant proximal stenosis or occlusion at the circle of Willis. Branch vessel evaluation cannot be performed secondary to significant patient motion on the time-of-flight images. These results were discussed in person at the time of interpretation on 04/27/2017 at 8:35 Pm to Dr. Roland Rack , who verbally acknowledged these results. Electronically Signed   By: San Morelle M.D.   On: 04/27/2017 21:09   Dg Chest Port 1 View  Result Date: 04/29/2017 CLINICAL DATA:  76 year old male with confusion. Acute renal failure. Hypertension. Subsequent encounter. EXAM: PORTABLE CHEST 1 VIEW COMPARISON:  04/28/2017 chest x-ray.  04/27/2017 chest CT. FINDINGS: Cardiomegaly Pulmonary vascular congestion greatest centrally. Bibasilar subsegmental atelectasis. Prominent mediastinum unchanged and evaluated on recent CT. IMPRESSION: Cardiomegaly. Pulmonary vascular congestion similar to prior exam. Bibasilar subsegmental atelectasis. Electronically Signed   By: Genia Del M.D.   On: 04/29/2017 07:15   Dg Chest Port 1 View  Result Date: 04/28/2017 CLINICAL DATA:  Renal failure EXAM: PORTABLE CHEST 1  VIEW COMPARISON:  Yesterday FINDINGS: Mediastinum remains prominent. Heart remains enlarged. Low volumes and bibasilar atelectasis. No pneumothorax. No pleural effusion. IMPRESSION: Cardiomegaly and bibasilar atelectasis. Electronically Signed   By: Marybelle Killings M.D.   On: 04/28/2017 07:23   Ct Head Code Stroke Wo Contrast  Result Date: 04/27/2017 CLINICAL DATA:  Code stroke.  Intermittent aphasia. EXAM: CT HEAD WITHOUT CONTRAST TECHNIQUE: Contiguous axial images were obtained from the base of the skull through the vertex without intravenous contrast. COMPARISON:  CT head from the same day at [facility]. CT head without contrast 06/04/2012 at [place]: Brain: No acute infarct, hemorrhage, or mass lesion is present. Mild atrophy and white matter changes are stable. No focal cortical defect is evident. The basal ganglia and insular ribbon are intact. Vascular: Vessels are somewhat hyperdense diffusely without significant asymmetry. Atherosclerotic calcifications are present in the cavernous internal carotid artery's. Skull: Calvarium is intact. No focal lytic or blastic lesions are present. Sinuses/Orbits: Paranasal sinuses are clear. A remote left orbital floor fracture is again noted. There is no evidence for entrapment of the inferior rectus muscle. The globes and orbits are otherwise within normal limits. ASPECTS [facility] Stroke Program Early CT Score) - Ganglionic level infarction (caudate, lentiform nuclei, internal capsule, insula, M1-M3 cortex): 7/7 - Supraganglionic infarction (M4-M6 cortex): 3/3 Total score (0-10 with 10 being normal): 10/10 IMPRESSION: 1. No acute intracranial abnormality or significant interval change. 2. Stable atrophy and white matter disease. 3. ASPECTS is 10/10 These results were pager texted at the time of interpretation on 04/27/2017 at 7:14 pm to Dr. Leonel Ramsay. Electronically Signed   By: San Morelle M.D.   On: 04/27/2017 19:14     Microbiology: Recent Results (from the past 240 hour(s))  MRSA PCR Screening     Status: None   Collection Time: 04/27/17  6:38 PM  Result Value Ref Range Status   MRSA by PCR NEGATIVE NEGATIVE Final    Comment:        The GeneXpert MRSA Assay (FDA approved for NASAL specimens only), is one component of a comprehensive MRSA colonization surveillance program. It is not intended to diagnose MRSA infection nor to guide or monitor treatment for MRSA infections.      Labs: Basic Metabolic  Panel:  Recent Labs Lab 05/01/17 0314 05/02/17 0540 05/03/17 0335 05/04/17 0224 05/05/17 0918  NA 137 137 136 135 137  K 4.5 5.2* 4.9 4.9 4.1  CL 107 107 107 102 99*  CO2 22 23 22 28 30   GLUCOSE 118* 87 82 123* 104*  BUN 39* 30* 26* 25* 26*  CREATININE 2.42* 1.95* 1.88* 2.13* 2.21*  CALCIUM 8.2* 8.7* 8.8* 9.1 9.2  MG 1.9  --   --   --   --   PHOS 3.3  --   --   --   --    Liver Function Tests:  Recent Labs Lab 05/01/17 0314  AST 34  ALT 20  ALKPHOS 59  BILITOT 1.1  PROT 5.9*  ALBUMIN 3.1*   CBC:  Recent Labs Lab 04/30/17 1547  05/02/17 0540 05/03/17 0335 05/04/17 0224 05/05/17 0231 05/06/17 0516  WBC 10.3  < > 9.2 8.5 8.8 11.8* 11.9*  NEUTROABS 8.1*  --   --   --   --   --   --   HGB 12.3*  < > 12.0* 12.2* 11.9* 11.4* 11.5*  HCT 39.4  < > 39.5 39.4 38.2* 36.9* 36.8*  MCV 92.7  < > 94.0 93.8 93.2 92.9 92.2  PLT 115*  < > 104* 111* 110* 114* 132*  < > = values in this interval not displayed.   Signed:  Barton Dubois MD.  [facility] 05/06/2017, 1:06 PM

## 2017-05-06 NOTE — Progress Notes (Signed)
Progress Note  Patient Name: Justin Parsons Date of Encounter: 05/06/2017  Primary Cardiologist: New to [facility] seen by Dr. [place] initially (will be followed in [place] as he lives there)  Subjective   No dyspnea. He had an atypical RUQ pain yesterday that was mild and has resolved. Postop day #3 LAD orbital atherectomy, PCI and drug-eluting stenting with residual CAD treated medically.  Inpatient Medications    Scheduled Meds: . allopurinol  100 mg Oral Daily  . aspirin EC  81 mg Oral Daily  . atorvastatin  80 mg Oral q1800  . carvedilol  3.125 mg Oral BID WC  . clopidogrel  75 mg Oral Daily  . docusate sodium  100 mg Oral BID  . folic acid  1 mg Oral Daily  . levothyroxine  50 mcg Oral QAC breakfast  . pantoprazole  40 mg Oral Daily  . polyethylene glycol  17 g Oral Daily  . predniSONE  20 mg Oral Q breakfast  . sodium chloride flush  3 mL Intravenous Q12H  . sodium chloride flush  3 mL Intravenous Q12H  . thiamine  100 mg Oral Daily   Continuous Infusions: . sodium chloride 10 mL/hr at 05/02/17 0300  . sodium chloride    . sodium chloride     PRN Meds: sodium chloride, sodium chloride, gi cocktail, levalbuterol, LORazepam, ondansetron, RESOURCE THICKENUP CLEAR, sodium chloride flush, sodium chloride flush, traMADol   Vital Signs    Vitals:   05/05/17 1324 05/05/17 1957 05/06/17 0504 05/06/17 0832  BP: 111/74 115/79 121/73 111/71  Pulse: 87 87 77 83  Resp: 18 19 18 16   Temp: 97.6 F (36.4 C) 97.5 F (36.4 C) 97.5 F (36.4 C) 98.5 F (36.9 C)  TempSrc: Oral Oral Oral Oral  SpO2: 100% 100% 100% 100%  Weight:   277 lb 8 oz (125.9 kg)   Height:        Intake/Output Summary (Last 24 hours) at 05/06/17 0941 Last data filed at 05/06/17 0646  Gross per 24 hour  Intake              240 ml  Output             1725 ml  Net            -1485 ml   Filed Weights   05/04/17 0039 05/05/17 0445 05/06/17 0504  Weight: 277 lb 5.4 oz (125.8 kg) 274 lb 11.1 oz (124.6  kg) 277 lb 8 oz (125.9 kg)    Telemetry   afib at rate of 90s, occasional PVCs versus aberrantly conducted beats- Personally Reviewed  ECG    Not performed today - Personally Reviewed  Physical Exam   GEN: No acute distress.   Neck: No JVD Cardiac: RRR, no murmurs, rubs, or gallops. Right radial cath site stable. Respiratory: Clear to auscultation bilaterally. GI: Soft, nontender, non-distended  MS: No edema; No deformity. Neuro:  Nonfocal  Psych: Normal affect   Labs    Chemistry  Recent Labs Lab 05/01/17 0314  05/03/17 0335 05/04/17 0224 05/05/17 0918  NA 137  < > 136 135 137  K 4.5  < > 4.9 4.9 4.1  CL 107  < > 107 102 99*  CO2 22  < > 22 28 30   GLUCOSE 118*  < > 82 123* 104*  BUN 39*  < > 26* 25* 26*  CREATININE 2.42*  < > 1.88* 2.13* 2.21*  CALCIUM 8.2*  < > 8.8* 9.1 9.2  PROT 5.9*  --   --   --   --   ALBUMIN 3.1*  --   --   --   --   AST 34  --   --   --   --   ALT 20  --   --   --   --   ALKPHOS 59  --   --   --   --   BILITOT 1.1  --   --   --   --   GFRNONAA 24*  < > 33* 28* 27*  GFRAA 28*  < > 38* 33* 31*  ANIONGAP 8  < > 7 5 8   < > = values in this interval not displayed.   Hematology  Recent Labs Lab 05/04/17 0224 05/05/17 0231 05/06/17 0516  WBC 8.8 11.8* 11.9*  RBC 4.10* 3.97* 3.99*  HGB 11.9* 11.4* 11.5*  HCT 38.2* 36.9* 36.8*  MCV 93.2 92.9 92.2  MCH 29.0 28.7 28.8  MCHC 31.2 30.9 31.3  RDW 17.3* 17.0* 16.6*  PLT 110* 114* 132*    Cardiac Enzymes No results for input(s): TROPONINI in the last 168 hours. No results for input(s): TROPIPOC in the last 168 hours.   BNPNo results for input(s): BNP, PROBNP in the last 168 hours.   DDimer No results for input(s): DDIMER in the last 168 hours.   Radiology    No results found.  Cardiac Studies   Echo 04/29/15 Study Conclusions  - Left ventricle: Endocardium not well seen. The cavity size was   normal. Wall thickness was increased in a pattern of severe LVH.   Systolic  function was normal. The estimated ejection fraction was   in the range of 50% to 55%. The study is not technically   sufficient to allow evaluation of LV diastolic function. - Aortic valve: Calcified non coronary cusp. - Mitral valve: Calcfied anterior leaflet Calcified annulus. There   was mild regurgitation. - Left atrium: The atrium was moderately dilated. - Atrial septum: No defect or patent foramen ovale was identified. - Pulmonary arteries: PA peak pressure: 49 mm Hg (S).  Cath 05/01/17 Left Heart Cath and Coronary Angiography  Conclusion     Ost LAD lesion, 85 %stenosed - apparently thrombotic with TIMI 2 flow distally  1st Diag lesion(major trunk of diagonal), 100 %stenosed.  1st Mrg lesion, 100 %stenosed.  Extensively diseased RCA that is a very large caliber vessel/ectatic with extensive thrombus in the proximal to mid portion with reduced flow distally  Prox RCA to Mid RCA lesion, 75 %stenosed. heavily thrombotic, ulcerated irregular vessel  Mid RCA to Dist RCA lesion, 60 %stenosed. Dist RCA to tandem lesions, 70 %stenosed.  Post Atrio lesion, 80 %stenosed - also appears to be ulcerated and thrombotic with some ectatic segments.  HEMODYNAMICS  LV end diastolic pressure is mildly elevated.  There is no aortic valve stenosis.   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 The 2 occluded vessels being diagonal branch and OM are not very large caliber vessels, however diagonal branch does appear to have the potential cover large area of distribution.  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 He would not be a good candidate for PCI as this would require extensive stents, and elevate the LAD lesion is amenable to PCI. I will restart IV heparin for atrial  fibrillation (  I think were too far out to start Aggrastat).   The case was discussed with Dr. Debara Pickett who will contact CT surgery for consultation to determine his appropriateness for CABG   Coronary Stent Intervention  05/03/17  Coronary Atherectomy  Conclusion     Ost LAD lesion, 85 %stenosed.  A STENT SYNERGY DES 4X12 drug eluting stent was successfully placed. Post-dilated to 5.1 mm  Post intervention, there is a 0% residual stenosis.  1st Diag lesion, 100 %stenosed. unable to cross with wire  1st Mrg lesion, 100 %stenosed.  Severely elevated LVEDP of 36 mmHg   Successful atherectomy based PCI of ostial LAD landing a stent just within the Left Main but not encroaching upon the Circumflex. The stent was postdilated up to 5.1 mm..  The patient required significant amount of contrast simply because of his body habitus and poor visibility trying to place an ostial stent.  An attempt was made to wire the occluded branch of the first diagonal. The guidewire would not cross. However there did appear to be some faint filling further down than the original occlusion site on post-PCI angiography.  Plan: The patient is being transferred to 6 Central post procedure unit for TR band removal and ongoing monitoring. I'll write for least one dose of IV Lasix to be given this evening. He may require further dosing based on his elevated EDP.  He will be given gentle hydration given his CK D.  Plan for now will be to treat the occluded OM branch and diagonal branch medically. I was unable to engage the RCA which was the initial plan.  This can be evaluated at a later date.  He will need to be on aspirin plus Plavix for at least one year. Would not do triple therapy based on his bleeding risk.   Diagnostic Diagram       Post-Intervention Diagram          Patient Profile     76 yo WM w/ h/o cirrhosis of the liver, CKD, HTN, cirrhosis,and h/o TIA, transferred from ED at [facility] for evaluation of chest pain, hypotension and bradycardia. 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  He was noted to be bradycardic with junctional rhythm and Afib. He was hypotensive with BP of 63/49. Given atropine IV and fluids. Stat CT of chest/abdomen/pelvis obtained which showed no acute process/ dissection/or PE. Also with AKI with admission troponin up.   Assessment & Plan    1. NSTEMI - Peak of troponin 40.36. Treated with IV heparin.  Echo with normal LVEF at 50-55%. 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 Seen by CTCA and did not felt good candidate for CABG. Case was reviewed with Dr. Burt Knack who recommended atherectomy and stenting of the ostial LAD which will require stenting back into the left main and possibly bifurcational approach if the circumflex is compromised.  - S/p Successful atherectomy based PCI of ostial LAD landing a stent just within the Left Main but not encroaching upon the Circumflex. The stent was postdilated up to 5.1 mm. Unable to cross wire across diag branch. Unable to engage the RCA which was the initial plan due to large contrast was used.  This can be evaluated at a later date.  - Continue ASA, Plavix , BB and statin.  - Given his portal hypertension I agree the triple therapy would be high-risk for bleeding despite his atrial fibrillation. I suggest to antiplatelet therapy for a month followed by  discontinuation of aspirin and beginning a novel oral anticoagulant at that time. He lives in [place] and therefore following up with one of our [facility] cardiologist would be easier for the patient and compliance as well. We will arrange a follow up in [place]. - He may benefit from an interim rehabilitation facility.  2. Atrial fibrillation likely chronic - Rate relatively controlled. Plan to treated with DAPT therapy as his risk of bleeding in the setting of cirrhosis and portal HTN is excessive. Tolerating IV heparin well here.   3.  Acute on CKD, stage III - Baseline around 2.1. On admission 3.36 that improved to 1.88 yesterday. Post PCI (large contrast used yesterday) Scr of 2.13 today. Follow closely.  4. Liver cirrhosis with portal gastropathy  5. Encephalopahy  Signed, Ena Dawley, MD  05/06/2017, 9:41 AM

## 2017-05-06 NOTE — Progress Notes (Signed)
Clinical Social Worker facilitated patient discharge including contacting patient family and facility to confirm patient discharge plans.  Clinical information faxed to facility and family agreeable with plan.  CSW arranged ambulance transport via [facility] to [place]  .  RN to call [phone] and ask for RN Jacquenette Shone  (pt will be placed in [facility] hall in rm#162 bed 2) report prior to discharge.  Clinical Social Worker will sign off for now as social work intervention is no longer needed. Please consult [place] again if new need arises.  Rhea Pink, MSW, [place]

## 2017-05-06 NOTE — Progress Notes (Signed)
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 Found to have NSTEMI. s/p LAD orbital atherectomy, PCI and drug-eluting stenting 6/15. Reportedly drinks moonshine aka "creek water," suspect metabolic encephalopathy +/- withdrawal. Plan for cardiac cath 6/13. Head MRI, CT-unremarkable. PMH includes HTN, CKD, alcoholism, cirrhosis of liver, TIA.    PT Comments    Patient progressing well with mobility. Tolerated short distance ambulation with Min guard assist for safety. Pt scared of using RW due to the wheels. Sp02 dropped to 87% on 2L/min 02. HR stable. Worked on LE strengthening via sit to stands from low recliner. Pt limited by gout in bil feet- wants to be put back on his medicine. Will continue to follow and progress as tolerated.    Follow Up Recommendations  SNF;Supervision for mobility/OOB     Equipment Recommendations  None recommended by PT    Recommendations for Other Services       Precautions / Restrictions Precautions Precautions: Fall Precaution Comments: watch 02 Restrictions Weight Bearing Restrictions: No    Mobility  Bed Mobility Overal bed mobility: Needs Assistance Bed Mobility: Rolling;Sidelying to Sit Rolling: Min guard Sidelying to sit: Min guard;HOB elevated       General bed mobility comments: Heavy use of rail and HOB elevated, no assist needed but increased time and close guard for safety.   Transfers Overall transfer level: Needs assistance Equipment used: Rolling walker (2 wheeled) Transfers: Sit to/from Stand Sit to Stand: Min guard Stand pivot transfers: Min guard       General transfer comment: Min guard to steady in standing with cues for hand  placment/technique. SPT to chair.  Ambulation/Gait Ambulation/Gait assistance: Min guard Ambulation Distance (Feet): 12 Feet (x2 bouts) Assistive device: Rolling walker (2 wheeled) Gait Pattern/deviations: Step-to pattern;Step-through pattern;Trunk flexed;Decreased stride length Gait velocity: decreased   General Gait Details: Slow, mildly unsteady gait. Able to walk 12' forwards and backwards with RW. Sp02 dropped to 87% but recovered during rest break. HR stable.   Stairs            Wheelchair Mobility    Modified Rankin (Stroke Patients Only)       Balance Overall balance assessment: Needs assistance Sitting-balance support: Feet supported;No upper extremity supported Sitting balance-Leahy Scale: Good     Standing balance support: During functional activity;Bilateral upper extremity supported Standing balance-Leahy Scale: Poor Standing balance comment: relies on RW                            Cognition Arousal/Alertness: Awake/alert Behavior During Therapy: WFL for tasks assessed/performed Overall Cognitive Status: Within Functional Limits for tasks assessed                                        Exercises Other Exercises Other Exercises: Sit to stand x6 with emphasizr on slow descent from chair.    General Comments        Pertinent Vitals/Pain Pain Assessment: Faces Faces Pain Scale: Hurts a little bit Pain Location: bil feet Pain Descriptors / Indicators: Sore Pain Intervention(s): Monitored during session;Repositioned    Home Living  Prior Function            PT Goals (current goals can now be found in the care plan section) Progress towards PT goals: Progressing toward goals    Frequency    Min 4X/week      PT Plan Current plan remains appropriate    Co-evaluation              AM-PAC PT "6 Clicks" Daily Activity  Outcome Measure  Difficulty turning over in bed  (including adjusting bedclothes, sheets and blankets)?: None Difficulty moving from lying on back to sitting on the side of the bed? : None Difficulty sitting down on and standing up from a chair with arms (e.g., wheelchair, bedside commode, etc,.)?: None Help needed moving to and from a bed to chair (including a wheelchair)?: None Help needed walking in hospital room?: A Little Help needed climbing 3-5 steps with a railing? : A Lot 6 Click Score: 21    End of Session Equipment Utilized During Treatment: Gait belt;Oxygen Activity Tolerance: Patient tolerated treatment well Patient left: in chair;with call bell/phone within reach;with chair alarm set Nurse Communication: Mobility status PT Visit Diagnosis: Unsteadiness on feet (R26.81);Muscle weakness (generalized) (M62.81)     Time: 1116-1140 PT Time Calculation (min) (ACUTE ONLY): 24 min  Charges:  $Gait Training: 8-22 mins $Therapeutic Activity: 8-22 mins                    G Codes:       Wray Kearns, PT, DPT [phone]     [name] 05/06/2017, 11:54 AM

## 2017-05-07 DIAGNOSIS — I2510 Atherosclerotic heart disease of native coronary artery without angina pectoris: Secondary | ICD-10-CM | POA: Diagnosis not present

## 2017-05-07 DIAGNOSIS — M109 Gout, unspecified: Secondary | ICD-10-CM | POA: Diagnosis not present

## 2017-05-07 DIAGNOSIS — I4891 Unspecified atrial fibrillation: Secondary | ICD-10-CM | POA: Diagnosis not present

## 2017-05-07 DIAGNOSIS — Z7901 Long term (current) use of anticoagulants: Secondary | ICD-10-CM | POA: Diagnosis not present

## 2017-05-14 DIAGNOSIS — I2510 Atherosclerotic heart disease of native coronary artery without angina pectoris: Secondary | ICD-10-CM | POA: Diagnosis not present

## 2017-05-14 DIAGNOSIS — Z7901 Long term (current) use of anticoagulants: Secondary | ICD-10-CM | POA: Diagnosis not present

## 2017-05-14 DIAGNOSIS — I482 Chronic atrial fibrillation: Secondary | ICD-10-CM | POA: Diagnosis not present

## 2017-05-14 DIAGNOSIS — M109 Gout, unspecified: Secondary | ICD-10-CM | POA: Diagnosis not present

## 2017-05-21 ENCOUNTER — Encounter: Payer: Self-pay | Admitting: Cardiology

## 2017-05-21 NOTE — Progress Notes (Deleted)
Cardiology Office Note  Date: 05/21/2017   ID: Justin Parsons, Justin Parsons July 19, 1941, MRN [id number]  PCP: Monico Blitz, MD  Evaluating Cardiologist: Rozann Lesches, MD   No chief complaint on file.   History of Present Illness: Justin Parsons is a medically complex and high risk 76 y.o. male that I am meeting for the first time in clinic today. I reviewed extensive records and updated his chart. He was recently hospitalized at [facility] with encephalopathy, acute on chronic renal failure with CKD stage III at baseline, NSTEMI with troponin I greater than 40, atrial fibrillation, and multivessel CAD. He did ultimately undergo PCI with incomplete revascularization overall (was turned down for CABG), report reviewed below.  In terms of his documented atrial fibrillation, decision was made by cardiology team at [facility] for him to stay on DAPT for one month in light of DES intervention, then consider transitioning to Plavix and DOAC.    Past Medical History:  Diagnosis Date  . Adenomatous polyp of colon   . Alcoholism ([place])    [place]  . Arthritis   . Atrial fibrillation ([place])   . Blindness of right eye 1997  . CAD (coronary artery disease)    Multivessel disease at cardiac catheterization June 2018, DES to LAD June 2018 with significant residual disease including diagonal and obtuse marginal vessels as well as RCA  . Cirrhosis of liver ([place])    Completed Hep A/B vaccinations 2015/2016  . CKD (chronic kidney disease) stage 3, GFR 30-59 ml/min   . Essential hypertension   . Hiatal hernia   . Hypothyroidism   . 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 TIA (transient ischemic attack)     Past Surgical History:  Procedure Laterality Date  . CARDIAC CATHETERIZATION  2003  . COLONOSCOPY  08/2008   normal, repeat exam in 5-7 years  . COLONOSCOPY  2004   rectal adenomatous polyp  . COLONOSCOPY N/A 12/01/2014   VEL:FYBOFB rectum/elongated colon  . CORONARY ATHERECTOMY N/A  05/03/2017   Procedure: Coronary Atherectomy;  Surgeon: Leonie Man, MD;  Location: [place] [facility];  Service: Cardiovascular;  Laterality: N/A;  . CORONARY STENT INTERVENTION N/A 05/03/2017   Procedure: Coronary Stent Intervention;  Surgeon: Leonie Man, MD;  Location: [place] [facility];  Service: Cardiovascular;  Laterality: N/A;  . ESOPHAGEAL DILATION N/A 02/07/2015   Procedure: ESOPHAGEAL DILATION;  Surgeon: Daneil Dolin, MD;  [facility];  Service: Endoscopy;  Laterality: N/A;  . ESOPHAGOGASTRODUODENOSCOPY  02/11/2012   Dr. Gala Romney: portal gastropathy, gastric erosions, esophageal ulcerations likely pill-induced, surveillance in 2 years  . ESOPHAGOGASTRODUODENOSCOPY N/A 12/01/2014   Dr. Volney American esophageal stricture dilated with the scope passage, portal gastropathy, negative H.pylori on gastric biopsies, esophageal biopsies benign  . ESOPHAGOGASTRODUODENOSCOPY N/A 02/07/2015   Procedure: ESOPHAGOGASTRODUODENOSCOPY (EGD);  Surgeon: Daneil Dolin, MD;  [facility];  Service: Endoscopy;  Laterality: N/A;  1115  . EYE SURGERY    . JOINT REPLACEMENT    . LEFT HEART CATH AND CORONARY ANGIOGRAPHY N/A 05/01/2017   Procedure: Left Heart Cath and Coronary Angiography;  Surgeon: Leonie Man, MD;  Location: [place] [facility];  Service: Cardiovascular;  Laterality: N/A;  . s/p eye implant  1997   artificial eye, right  . TOTAL HIP ARTHROPLASTY  2002  . TOTAL HIP ARTHROPLASTY  2006/2012   revision in 2012  . TOTAL KNEE ARTHROPLASTY  1999/2003   left/right    Current Outpatient Prescriptions  Medication Sig Dispense Refill  . allopurinol (ZYLOPRIM) 100 MG tablet Take 100 mg by mouth daily.      . Ascorbic Acid (VITAMIN C PO) Take 1 tablet by mouth daily.    Marland Kitchen aspirin EC 81 MG EC tablet Take 1 tablet (81 mg total) by mouth daily. Treatment for one month only.    Marland Kitchen atorvastatin (LIPITOR) 80 MG tablet Take 1 tablet (80 mg total) by mouth daily at 6 PM.      . carvedilol (COREG) 6.25 MG tablet Take 1 tablet (6.25 mg total) by mouth 2 (two) times daily with a meal.    . clopidogrel (PLAVIX) 75 MG tablet Take 1 tablet (75 mg total) by mouth daily.    . diclofenac (VOLTAREN) 75 MG EC tablet Take 75 mg by mouth 2 (two) times daily.    Marland Kitchen docusate sodium (COLACE) 100 MG capsule Take 1 capsule (100 mg total) by mouth 2 (two) times daily.    . folic acid (FOLVITE) 1 MG tablet Take 1 tablet (1 mg total) by mouth daily.    Marland Kitchen levothyroxine (SYNTHROID, LEVOTHROID) 50 MCG tablet Take 50 mcg by mouth daily.    Marland Kitchen omeprazole (PRILOSEC) 40 MG capsule Take 1 capsule (40 mg total) by mouth 2 (two) times daily. (Patient taking differently: Take 40 mg by mouth 2 (two) times daily as needed. ) 60 capsule 5  . polyethylene glycol (MIRALAX / GLYCOLAX) packet Take 17 g by mouth daily as needed for mild constipation.    . predniSONE (DELTASONE) 20 MG tablet Take 1 tablet (20 mg total) by mouth daily with breakfast. For 5 days    . thiamine 100 MG tablet Take 1 tablet (100 mg total) by mouth daily.    . traMADol (ULTRAM) 50 MG tablet Take 1 tablet (50 mg total) by mouth every 8 (eight) hours as needed for severe pain. 15 tablet 0   No current facility-administered medications for this visit.    Allergies:  Patient has no known allergies.   Social History: The patient  reports that he has never smoked. He has never used smokeless tobacco. He reports that he drinks alcohol. He reports that he does not use drugs.   Family History: The patient's family history includes Ovarian cancer in his sister.   ROS:  Please see the history of present illness. Otherwise, complete review of systems is positive for {NONE DEFAULTED:18576::"none"}.  All other systems are reviewed and negative.   Physical Exam: VS:  There were no vitals taken for this visit., BMI There is no height or weight on file to calculate BMI.  Wt Readings from Last 3 Encounters:  05/06/17 277 lb 8 oz (125.9 kg)   01/20/15 282 lb 12.8 oz (128.3 kg)  12/01/14 300 lb (136.1 kg)    General: Patient appears comfortable at rest. HEENT: Conjunctiva and lids normal, oropharynx clear with moist mucosa. Neck: Supple, no elevated JVP or carotid bruits, no thyromegaly. Lungs: Clear to auscultation, nonlabored breathing at rest. Cardiac: Regular rate and rhythm, no S3 or significant systolic murmur, no pericardial rub. Abdomen: Soft, nontender, no hepatomegaly, bowel sounds present, no guarding or rebound. Extremities: No pitting edema, distal pulses 2+. Skin: Warm and dry. Musculoskeletal: No kyphosis. Neuropsychiatric: Alert and oriented x3, affect grossly appropriate.  ECG: I personally reviewed the tracing from 05/03/2017 which showed atrial fibrillation with leftward axis and low voltage.  Recent Labwork: 04/27/2017: TSH 4.853 05/01/2017: ALT 20; AST 34; Magnesium 1.9 05/05/2017: BUN 26; Creatinine, Ser 2.21;  Potassium 4.1; Sodium 137 05/06/2017: Hemoglobin 11.5; Platelets 132     Component Value Date/Time   CHOL 123 04/30/2017 0149   TRIG 85 04/30/2017 0149   HDL 35 (L) 04/30/2017 0149   CHOLHDL 3.5 04/30/2017 0149   VLDL 17 04/30/2017 0149   LDLCALC 71 04/30/2017 0149    Other Studies Reviewed Today:  Cardiac catheterization 05/01/2017:  Colon [place] LAD lesion, 85 %stenosed - apparently thrombotic with TIMI 2 flow distally  1st Diag lesion(major trunk of diagonal), 100 %stenosed.  1st Mrg lesion, 100 %stenosed.  Extensively diseased RCA that is a very large caliber vessel/ectatic with extensive thrombus in the proximal to mid portion with reduced flow distally  Prox RCA to Mid RCA lesion, 75 %stenosed. heavily thrombotic, ulcerated irregular vessel  Mid RCA to Dist RCA lesion, 60 %stenosed. Dist RCA to tandem lesions, 70 %stenosed.  Post Atrio lesion, 80 %stenosed - also appears to be ulcerated and thrombotic with some ectatic segments.  HEMODYNAMICS  LV end diastolic pressure is mildly  elevated.  There is no aortic valve stenosis.  Coronary PCI 05/03/2017:  Ost LAD lesion, 85 %stenosed.  A STENT SYNERGY DES 4X12 drug eluting stent was successfully placed. Post-dilated to 5.1 mm  Post intervention, there is a 0% residual stenosis.  1st Diag lesion, 100 %stenosed. unable to cross with wire  1st Mrg lesion, 100 %stenosed.  Severely elevated LVEDP of 36 mmHg   Successful atherectomy based PCI of ostial LAD landing a stent just within the Left Main but not encroaching upon the Circumflex. The stent was postdilated up to 5.1 mm..  The patient required significant amount of contrast simply because of his body habitus and poor visibility trying to place an ostial stent.  An attempt was made to wire the occluded branch of the first diagonal. The guidewire would not cross. However there did appear to be some faint filling further down than the original occlusion site on post-PCI angiography.  Echocardiogram 04/28/2017: Study Conclusions  - Left ventricle: Endocardium not well seen. The cavity size was   normal. Wall thickness was increased in a pattern of severe LVH.   Systolic function was normal. The estimated ejection fraction was   in the range of 50% to 55%. The study is not technically   sufficient to allow evaluation of LV diastolic function. - Aortic valve: Calcified non coronary cusp. - Mitral valve: Calcfied anterior leaflet Calcified annulus. There   was mild regurgitation. - Left atrium: The atrium was moderately dilated. - Atrial septum: No defect or patent foramen ovale was identified. - Pulmonary arteries: PA peak pressure: 49 mm Hg (S).  Assessment and Plan:    Current medicines were reviewed with the patient today.  No orders of the defined types were placed in this encounter.   Disposition:  Signed, Satira Sark, MD, [facility] 05/21/2017 11:29 AM    [place] [facility] [place], [place], [place]  [address] Phone: [phone]; Fax: [phone]

## 2017-05-23 ENCOUNTER — Ambulatory Visit: Payer: Medicare Other | Admitting: Cardiology

## 2017-05-23 ENCOUNTER — Encounter: Payer: Self-pay | Admitting: Cardiology

## 2017-06-04 DIAGNOSIS — G9340 Encephalopathy, unspecified: Secondary | ICD-10-CM | POA: Diagnosis not present

## 2017-06-04 DIAGNOSIS — I4891 Unspecified atrial fibrillation: Secondary | ICD-10-CM | POA: Diagnosis not present

## 2017-06-04 DIAGNOSIS — N183 Chronic kidney disease, stage 3 (moderate): Secondary | ICD-10-CM | POA: Diagnosis not present

## 2017-06-04 DIAGNOSIS — I2510 Atherosclerotic heart disease of native coronary artery without angina pectoris: Secondary | ICD-10-CM | POA: Diagnosis not present

## 2017-06-05 DIAGNOSIS — M79675 Pain in left toe(s): Secondary | ICD-10-CM | POA: Diagnosis not present

## 2017-06-05 DIAGNOSIS — B351 Tinea unguium: Secondary | ICD-10-CM | POA: Diagnosis not present

## 2017-06-05 DIAGNOSIS — M79674 Pain in right toe(s): Secondary | ICD-10-CM | POA: Diagnosis not present

## 2017-06-05 DIAGNOSIS — I739 Peripheral vascular disease, unspecified: Secondary | ICD-10-CM | POA: Diagnosis not present

## 2017-06-05 DIAGNOSIS — L603 Nail dystrophy: Secondary | ICD-10-CM | POA: Diagnosis not present

## 2017-07-03 DIAGNOSIS — N19 Unspecified kidney failure: Secondary | ICD-10-CM | POA: Diagnosis not present

## 2017-07-03 DIAGNOSIS — Z6841 Body Mass Index (BMI) 40.0 and over, adult: Secondary | ICD-10-CM | POA: Diagnosis not present

## 2017-07-03 DIAGNOSIS — K7030 Alcoholic cirrhosis of liver without ascites: Secondary | ICD-10-CM | POA: Diagnosis present

## 2017-07-03 DIAGNOSIS — Z951 Presence of aortocoronary bypass graft: Secondary | ICD-10-CM | POA: Diagnosis not present

## 2017-07-03 DIAGNOSIS — I130 Hypertensive heart and chronic kidney disease with heart failure and stage 1 through stage 4 chronic kidney disease, or unspecified chronic kidney disease: Secondary | ICD-10-CM | POA: Diagnosis not present

## 2017-07-03 DIAGNOSIS — I959 Hypotension, unspecified: Secondary | ICD-10-CM | POA: Diagnosis not present

## 2017-07-03 DIAGNOSIS — Z8673 Personal history of transient ischemic attack (TIA), and cerebral infarction without residual deficits: Secondary | ICD-10-CM | POA: Diagnosis not present

## 2017-07-03 DIAGNOSIS — R112 Nausea with vomiting, unspecified: Secondary | ICD-10-CM | POA: Diagnosis not present

## 2017-07-03 DIAGNOSIS — Z955 Presence of coronary angioplasty implant and graft: Secondary | ICD-10-CM | POA: Diagnosis not present

## 2017-07-03 DIAGNOSIS — R069 Unspecified abnormalities of breathing: Secondary | ICD-10-CM | POA: Diagnosis not present

## 2017-07-03 DIAGNOSIS — N400 Enlarged prostate without lower urinary tract symptoms: Secondary | ICD-10-CM | POA: Diagnosis present

## 2017-07-03 DIAGNOSIS — M109 Gout, unspecified: Secondary | ICD-10-CM | POA: Diagnosis present

## 2017-07-03 DIAGNOSIS — E876 Hypokalemia: Secondary | ICD-10-CM | POA: Diagnosis not present

## 2017-07-03 DIAGNOSIS — E7800 Pure hypercholesterolemia, unspecified: Secondary | ICD-10-CM | POA: Diagnosis present

## 2017-07-03 DIAGNOSIS — E860 Dehydration: Secondary | ICD-10-CM | POA: Diagnosis not present

## 2017-07-03 DIAGNOSIS — I2510 Atherosclerotic heart disease of native coronary artery without angina pectoris: Secondary | ICD-10-CM | POA: Diagnosis present

## 2017-07-03 DIAGNOSIS — I482 Chronic atrial fibrillation: Secondary | ICD-10-CM | POA: Diagnosis present

## 2017-07-03 DIAGNOSIS — E875 Hyperkalemia: Secondary | ICD-10-CM | POA: Diagnosis not present

## 2017-07-03 DIAGNOSIS — E039 Hypothyroidism, unspecified: Secondary | ICD-10-CM | POA: Diagnosis present

## 2017-07-03 DIAGNOSIS — Z7401 Bed confinement status: Secondary | ICD-10-CM | POA: Diagnosis not present

## 2017-07-03 DIAGNOSIS — J90 Pleural effusion, not elsewhere classified: Secondary | ICD-10-CM | POA: Diagnosis not present

## 2017-07-03 DIAGNOSIS — N179 Acute kidney failure, unspecified: Secondary | ICD-10-CM | POA: Diagnosis not present

## 2017-07-03 DIAGNOSIS — I252 Old myocardial infarction: Secondary | ICD-10-CM | POA: Diagnosis not present

## 2017-07-03 DIAGNOSIS — H5440 Blindness, one eye, unspecified eye: Secondary | ICD-10-CM | POA: Diagnosis present

## 2017-07-03 DIAGNOSIS — J189 Pneumonia, unspecified organism: Secondary | ICD-10-CM | POA: Diagnosis not present

## 2017-07-03 DIAGNOSIS — I5033 Acute on chronic diastolic (congestive) heart failure: Secondary | ICD-10-CM | POA: Diagnosis not present

## 2017-07-03 DIAGNOSIS — J984 Other disorders of lung: Secondary | ICD-10-CM | POA: Diagnosis not present

## 2017-07-03 DIAGNOSIS — N184 Chronic kidney disease, stage 4 (severe): Secondary | ICD-10-CM | POA: Diagnosis present

## 2017-07-03 DIAGNOSIS — R9430 Abnormal result of cardiovascular function study, unspecified: Secondary | ICD-10-CM | POA: Diagnosis not present

## 2017-07-03 DIAGNOSIS — M898X9 Other specified disorders of bone, unspecified site: Secondary | ICD-10-CM | POA: Diagnosis present

## 2017-07-03 DIAGNOSIS — R05 Cough: Secondary | ICD-10-CM | POA: Diagnosis not present

## 2017-07-03 DIAGNOSIS — R0602 Shortness of breath: Secondary | ICD-10-CM | POA: Diagnosis not present

## 2017-07-03 DIAGNOSIS — M1990 Unspecified osteoarthritis, unspecified site: Secondary | ICD-10-CM | POA: Diagnosis present

## 2017-07-03 DIAGNOSIS — R279 Unspecified lack of coordination: Secondary | ICD-10-CM | POA: Diagnosis not present

## 2017-07-11 DIAGNOSIS — N189 Chronic kidney disease, unspecified: Secondary | ICD-10-CM | POA: Diagnosis not present

## 2017-07-11 DIAGNOSIS — M1000 Idiopathic gout, unspecified site: Secondary | ICD-10-CM | POA: Diagnosis not present

## 2017-07-11 DIAGNOSIS — Z8679 Personal history of other diseases of the circulatory system: Secondary | ICD-10-CM | POA: Diagnosis not present

## 2017-07-29 DIAGNOSIS — Z8679 Personal history of other diseases of the circulatory system: Secondary | ICD-10-CM | POA: Diagnosis not present

## 2017-07-29 DIAGNOSIS — N189 Chronic kidney disease, unspecified: Secondary | ICD-10-CM | POA: Diagnosis not present

## 2017-07-29 DIAGNOSIS — M109 Gout, unspecified: Secondary | ICD-10-CM | POA: Diagnosis not present

## 2017-08-05 DIAGNOSIS — K219 Gastro-esophageal reflux disease without esophagitis: Secondary | ICD-10-CM | POA: Diagnosis present

## 2017-08-05 DIAGNOSIS — I509 Heart failure, unspecified: Secondary | ICD-10-CM | POA: Diagnosis not present

## 2017-08-05 DIAGNOSIS — N184 Chronic kidney disease, stage 4 (severe): Secondary | ICD-10-CM | POA: Diagnosis not present

## 2017-08-05 DIAGNOSIS — M545 Low back pain: Secondary | ICD-10-CM | POA: Diagnosis not present

## 2017-08-05 DIAGNOSIS — Z955 Presence of coronary angioplasty implant and graft: Secondary | ICD-10-CM | POA: Diagnosis not present

## 2017-08-05 DIAGNOSIS — M1990 Unspecified osteoarthritis, unspecified site: Secondary | ICD-10-CM | POA: Diagnosis present

## 2017-08-05 DIAGNOSIS — Z9981 Dependence on supplemental oxygen: Secondary | ICD-10-CM | POA: Diagnosis not present

## 2017-08-05 DIAGNOSIS — K7030 Alcoholic cirrhosis of liver without ascites: Secondary | ICD-10-CM | POA: Diagnosis present

## 2017-08-05 DIAGNOSIS — R531 Weakness: Secondary | ICD-10-CM | POA: Diagnosis not present

## 2017-08-05 DIAGNOSIS — N400 Enlarged prostate without lower urinary tract symptoms: Secondary | ICD-10-CM | POA: Diagnosis present

## 2017-08-05 DIAGNOSIS — I5033 Acute on chronic diastolic (congestive) heart failure: Secondary | ICD-10-CM | POA: Diagnosis not present

## 2017-08-05 DIAGNOSIS — H54413A Blindness right eye category 3, normal vision left eye: Secondary | ICD-10-CM | POA: Diagnosis not present

## 2017-08-05 DIAGNOSIS — M6281 Muscle weakness (generalized): Secondary | ICD-10-CM | POA: Diagnosis not present

## 2017-08-05 DIAGNOSIS — E039 Hypothyroidism, unspecified: Secondary | ICD-10-CM | POA: Diagnosis present

## 2017-08-05 DIAGNOSIS — Z7901 Long term (current) use of anticoagulants: Secondary | ICD-10-CM | POA: Diagnosis not present

## 2017-08-05 DIAGNOSIS — R0602 Shortness of breath: Secondary | ICD-10-CM | POA: Diagnosis not present

## 2017-08-05 DIAGNOSIS — R2681 Unsteadiness on feet: Secondary | ICD-10-CM | POA: Diagnosis not present

## 2017-08-05 DIAGNOSIS — Z7902 Long term (current) use of antithrombotics/antiplatelets: Secondary | ICD-10-CM | POA: Diagnosis not present

## 2017-08-05 DIAGNOSIS — I4891 Unspecified atrial fibrillation: Secondary | ICD-10-CM | POA: Diagnosis not present

## 2017-08-05 DIAGNOSIS — G459 Transient cerebral ischemic attack, unspecified: Secondary | ICD-10-CM | POA: Diagnosis not present

## 2017-08-05 DIAGNOSIS — R42 Dizziness and giddiness: Secondary | ICD-10-CM | POA: Diagnosis not present

## 2017-08-05 DIAGNOSIS — K5909 Other constipation: Secondary | ICD-10-CM | POA: Diagnosis not present

## 2017-08-05 DIAGNOSIS — R1314 Dysphagia, pharyngoesophageal phase: Secondary | ICD-10-CM | POA: Diagnosis not present

## 2017-08-05 DIAGNOSIS — E7800 Pure hypercholesterolemia, unspecified: Secondary | ICD-10-CM | POA: Diagnosis present

## 2017-08-05 DIAGNOSIS — I5032 Chronic diastolic (congestive) heart failure: Secondary | ICD-10-CM | POA: Diagnosis not present

## 2017-08-05 DIAGNOSIS — I130 Hypertensive heart and chronic kidney disease with heart failure and stage 1 through stage 4 chronic kidney disease, or unspecified chronic kidney disease: Secondary | ICD-10-CM | POA: Diagnosis not present

## 2017-08-05 DIAGNOSIS — M109 Gout, unspecified: Secondary | ICD-10-CM | POA: Diagnosis present

## 2017-08-05 DIAGNOSIS — R0682 Tachypnea, not elsewhere classified: Secondary | ICD-10-CM | POA: Diagnosis not present

## 2017-08-05 DIAGNOSIS — R1311 Dysphagia, oral phase: Secondary | ICD-10-CM | POA: Diagnosis not present

## 2017-08-05 DIAGNOSIS — Z79899 Other long term (current) drug therapy: Secondary | ICD-10-CM | POA: Diagnosis not present

## 2017-08-05 DIAGNOSIS — Z8673 Personal history of transient ischemic attack (TIA), and cerebral infarction without residual deficits: Secondary | ICD-10-CM | POA: Diagnosis not present

## 2017-08-09 DIAGNOSIS — E119 Type 2 diabetes mellitus without complications: Secondary | ICD-10-CM | POA: Diagnosis not present

## 2017-08-09 DIAGNOSIS — I5032 Chronic diastolic (congestive) heart failure: Secondary | ICD-10-CM | POA: Diagnosis not present

## 2017-08-09 DIAGNOSIS — I9589 Other hypotension: Secondary | ICD-10-CM | POA: Diagnosis not present

## 2017-08-09 DIAGNOSIS — I959 Hypotension, unspecified: Secondary | ICD-10-CM | POA: Diagnosis not present

## 2017-08-09 DIAGNOSIS — M6281 Muscle weakness (generalized): Secondary | ICD-10-CM | POA: Diagnosis not present

## 2017-08-09 DIAGNOSIS — K5909 Other constipation: Secondary | ICD-10-CM | POA: Diagnosis not present

## 2017-08-09 DIAGNOSIS — I480 Paroxysmal atrial fibrillation: Secondary | ICD-10-CM | POA: Diagnosis not present

## 2017-08-09 DIAGNOSIS — E038 Other specified hypothyroidism: Secondary | ICD-10-CM | POA: Diagnosis not present

## 2017-08-09 DIAGNOSIS — Z79899 Other long term (current) drug therapy: Secondary | ICD-10-CM | POA: Diagnosis not present

## 2017-08-09 DIAGNOSIS — D518 Other vitamin B12 deficiency anemias: Secondary | ICD-10-CM | POA: Diagnosis not present

## 2017-08-09 DIAGNOSIS — E871 Hypo-osmolality and hyponatremia: Secondary | ICD-10-CM | POA: Diagnosis present

## 2017-08-09 DIAGNOSIS — I4891 Unspecified atrial fibrillation: Secondary | ICD-10-CM | POA: Diagnosis not present

## 2017-08-09 DIAGNOSIS — R740 Nonspecific elevation of levels of transaminase and lactic acid dehydrogenase [LDH]: Secondary | ICD-10-CM | POA: Diagnosis not present

## 2017-08-09 DIAGNOSIS — I2510 Atherosclerotic heart disease of native coronary artery without angina pectoris: Secondary | ICD-10-CM | POA: Diagnosis present

## 2017-08-09 DIAGNOSIS — E7800 Pure hypercholesterolemia, unspecified: Secondary | ICD-10-CM | POA: Diagnosis not present

## 2017-08-09 DIAGNOSIS — R1311 Dysphagia, oral phase: Secondary | ICD-10-CM | POA: Diagnosis not present

## 2017-08-09 DIAGNOSIS — E860 Dehydration: Secondary | ICD-10-CM | POA: Diagnosis not present

## 2017-08-09 DIAGNOSIS — K219 Gastro-esophageal reflux disease without esophagitis: Secondary | ICD-10-CM | POA: Diagnosis not present

## 2017-08-09 DIAGNOSIS — H54413A Blindness right eye category 3, normal vision left eye: Secondary | ICD-10-CM | POA: Diagnosis not present

## 2017-08-09 DIAGNOSIS — Z7901 Long term (current) use of anticoagulants: Secondary | ICD-10-CM | POA: Diagnosis not present

## 2017-08-09 DIAGNOSIS — E782 Mixed hyperlipidemia: Secondary | ICD-10-CM | POA: Diagnosis not present

## 2017-08-09 DIAGNOSIS — D696 Thrombocytopenia, unspecified: Secondary | ICD-10-CM | POA: Diagnosis not present

## 2017-08-09 DIAGNOSIS — I447 Left bundle-branch block, unspecified: Secondary | ICD-10-CM | POA: Diagnosis present

## 2017-08-09 DIAGNOSIS — N184 Chronic kidney disease, stage 4 (severe): Secondary | ICD-10-CM | POA: Diagnosis not present

## 2017-08-09 DIAGNOSIS — R748 Abnormal levels of other serum enzymes: Secondary | ICD-10-CM | POA: Diagnosis not present

## 2017-08-09 DIAGNOSIS — K7030 Alcoholic cirrhosis of liver without ascites: Secondary | ICD-10-CM | POA: Diagnosis not present

## 2017-08-09 DIAGNOSIS — N179 Acute kidney failure, unspecified: Secondary | ICD-10-CM | POA: Diagnosis not present

## 2017-08-09 DIAGNOSIS — R7989 Other specified abnormal findings of blood chemistry: Secondary | ICD-10-CM | POA: Diagnosis not present

## 2017-08-09 DIAGNOSIS — K311 Adult hypertrophic pyloric stenosis: Secondary | ICD-10-CM | POA: Diagnosis not present

## 2017-08-09 DIAGNOSIS — R339 Retention of urine, unspecified: Secondary | ICD-10-CM | POA: Diagnosis present

## 2017-08-09 DIAGNOSIS — M545 Low back pain: Secondary | ICD-10-CM | POA: Diagnosis not present

## 2017-08-09 DIAGNOSIS — R1110 Vomiting, unspecified: Secondary | ICD-10-CM | POA: Diagnosis not present

## 2017-08-09 DIAGNOSIS — N400 Enlarged prostate without lower urinary tract symptoms: Secondary | ICD-10-CM | POA: Diagnosis not present

## 2017-08-09 DIAGNOSIS — E039 Hypothyroidism, unspecified: Secondary | ICD-10-CM | POA: Diagnosis not present

## 2017-08-09 DIAGNOSIS — K222 Esophageal obstruction: Secondary | ICD-10-CM | POA: Diagnosis present

## 2017-08-09 DIAGNOSIS — E876 Hypokalemia: Secondary | ICD-10-CM | POA: Diagnosis present

## 2017-08-09 DIAGNOSIS — Z23 Encounter for immunization: Secondary | ICD-10-CM | POA: Diagnosis not present

## 2017-08-09 DIAGNOSIS — K2970 Gastritis, unspecified, without bleeding: Secondary | ICD-10-CM | POA: Diagnosis present

## 2017-08-09 DIAGNOSIS — R1314 Dysphagia, pharyngoesophageal phase: Secondary | ICD-10-CM | POA: Diagnosis not present

## 2017-08-09 DIAGNOSIS — E861 Hypovolemia: Secondary | ICD-10-CM | POA: Diagnosis not present

## 2017-08-09 DIAGNOSIS — G459 Transient cerebral ischemic attack, unspecified: Secondary | ICD-10-CM | POA: Diagnosis not present

## 2017-08-09 DIAGNOSIS — I482 Chronic atrial fibrillation: Secondary | ICD-10-CM | POA: Diagnosis not present

## 2017-08-09 DIAGNOSIS — M109 Gout, unspecified: Secondary | ICD-10-CM | POA: Diagnosis not present

## 2017-08-09 DIAGNOSIS — I248 Other forms of acute ischemic heart disease: Secondary | ICD-10-CM | POA: Diagnosis present

## 2017-08-09 DIAGNOSIS — R945 Abnormal results of liver function studies: Secondary | ICD-10-CM | POA: Diagnosis not present

## 2017-08-09 DIAGNOSIS — I639 Cerebral infarction, unspecified: Secondary | ICD-10-CM | POA: Diagnosis not present

## 2017-08-09 DIAGNOSIS — E559 Vitamin D deficiency, unspecified: Secondary | ICD-10-CM | POA: Diagnosis not present

## 2017-08-09 DIAGNOSIS — I252 Old myocardial infarction: Secondary | ICD-10-CM | POA: Diagnosis not present

## 2017-08-09 DIAGNOSIS — R2681 Unsteadiness on feet: Secondary | ICD-10-CM | POA: Diagnosis not present

## 2017-08-09 DIAGNOSIS — R112 Nausea with vomiting, unspecified: Secondary | ICD-10-CM | POA: Diagnosis not present

## 2017-08-09 DIAGNOSIS — I129 Hypertensive chronic kidney disease with stage 1 through stage 4 chronic kidney disease, or unspecified chronic kidney disease: Secondary | ICD-10-CM | POA: Diagnosis present

## 2017-08-09 DIAGNOSIS — K3184 Gastroparesis: Secondary | ICD-10-CM | POA: Diagnosis present

## 2017-08-09 DIAGNOSIS — J9611 Chronic respiratory failure with hypoxia: Secondary | ICD-10-CM | POA: Diagnosis present

## 2017-08-09 DIAGNOSIS — N183 Chronic kidney disease, stage 3 (moderate): Secondary | ICD-10-CM | POA: Diagnosis not present

## 2017-08-12 DIAGNOSIS — M109 Gout, unspecified: Secondary | ICD-10-CM | POA: Diagnosis not present

## 2017-08-12 DIAGNOSIS — I5032 Chronic diastolic (congestive) heart failure: Secondary | ICD-10-CM | POA: Diagnosis not present

## 2017-08-12 DIAGNOSIS — E038 Other specified hypothyroidism: Secondary | ICD-10-CM | POA: Diagnosis not present

## 2017-08-12 DIAGNOSIS — D518 Other vitamin B12 deficiency anemias: Secondary | ICD-10-CM | POA: Diagnosis not present

## 2017-08-12 DIAGNOSIS — E039 Hypothyroidism, unspecified: Secondary | ICD-10-CM | POA: Diagnosis not present

## 2017-08-12 DIAGNOSIS — I4891 Unspecified atrial fibrillation: Secondary | ICD-10-CM | POA: Diagnosis not present

## 2017-08-12 DIAGNOSIS — E782 Mixed hyperlipidemia: Secondary | ICD-10-CM | POA: Diagnosis not present

## 2017-08-12 DIAGNOSIS — Z79899 Other long term (current) drug therapy: Secondary | ICD-10-CM | POA: Diagnosis not present

## 2017-08-12 DIAGNOSIS — E119 Type 2 diabetes mellitus without complications: Secondary | ICD-10-CM | POA: Diagnosis not present

## 2017-08-14 DIAGNOSIS — Z79899 Other long term (current) drug therapy: Secondary | ICD-10-CM | POA: Diagnosis not present

## 2017-08-14 DIAGNOSIS — E038 Other specified hypothyroidism: Secondary | ICD-10-CM | POA: Diagnosis not present

## 2017-08-14 DIAGNOSIS — E559 Vitamin D deficiency, unspecified: Secondary | ICD-10-CM | POA: Diagnosis not present

## 2017-08-14 DIAGNOSIS — E782 Mixed hyperlipidemia: Secondary | ICD-10-CM | POA: Diagnosis not present

## 2017-08-14 DIAGNOSIS — D518 Other vitamin B12 deficiency anemias: Secondary | ICD-10-CM | POA: Diagnosis not present

## 2017-08-14 DIAGNOSIS — E119 Type 2 diabetes mellitus without complications: Secondary | ICD-10-CM | POA: Diagnosis not present

## 2017-08-15 ENCOUNTER — Ambulatory Visit (INDEPENDENT_AMBULATORY_CARE_PROVIDER_SITE_OTHER): Payer: Medicare Other | Admitting: Nurse Practitioner

## 2017-08-15 ENCOUNTER — Inpatient Hospital Stay (HOSPITAL_COMMUNITY)
Admission: EM | Admit: 2017-08-15 | Discharge: 2017-08-19 | DRG: 683 | Disposition: A | Discharge status: Skilled Nursing Facility | Payer: Medicare Other | Attending: Internal Medicine | Admitting: Internal Medicine

## 2017-08-15 ENCOUNTER — Emergency Department (HOSPITAL_COMMUNITY): Payer: Medicare Other

## 2017-08-15 ENCOUNTER — Inpatient Hospital Stay (HOSPITAL_COMMUNITY): Payer: Medicare Other

## 2017-08-15 ENCOUNTER — Encounter (HOSPITAL_COMMUNITY): Payer: Self-pay | Admitting: Emergency Medicine

## 2017-08-15 ENCOUNTER — Encounter: Payer: Self-pay | Admitting: Nurse Practitioner

## 2017-08-15 VITALS — BP 88/68 | HR 90 | Temp 97.0°F | Ht 68.0 in

## 2017-08-15 DIAGNOSIS — D696 Thrombocytopenia, unspecified: Secondary | ICD-10-CM | POA: Diagnosis present

## 2017-08-15 DIAGNOSIS — K3184 Gastroparesis: Secondary | ICD-10-CM | POA: Diagnosis not present

## 2017-08-15 DIAGNOSIS — I4891 Unspecified atrial fibrillation: Secondary | ICD-10-CM | POA: Diagnosis not present

## 2017-08-15 DIAGNOSIS — K30 Functional dyspepsia: Secondary | ICD-10-CM | POA: Diagnosis present

## 2017-08-15 DIAGNOSIS — K5909 Other constipation: Secondary | ICD-10-CM | POA: Diagnosis not present

## 2017-08-15 DIAGNOSIS — I959 Hypotension, unspecified: Secondary | ICD-10-CM | POA: Diagnosis not present

## 2017-08-15 DIAGNOSIS — N179 Acute kidney failure, unspecified: Secondary | ICD-10-CM | POA: Diagnosis not present

## 2017-08-15 DIAGNOSIS — R740 Nonspecific elevation of levels of transaminase and lactic acid dehydrogenase [LDH]: Secondary | ICD-10-CM

## 2017-08-15 DIAGNOSIS — Z86718 Personal history of other venous thrombosis and embolism: Secondary | ICD-10-CM

## 2017-08-15 DIAGNOSIS — I252 Old myocardial infarction: Secondary | ICD-10-CM | POA: Diagnosis not present

## 2017-08-15 DIAGNOSIS — I248 Other forms of acute ischemic heart disease: Secondary | ICD-10-CM | POA: Diagnosis present

## 2017-08-15 DIAGNOSIS — R1314 Dysphagia, pharyngoesophageal phase: Secondary | ICD-10-CM | POA: Diagnosis present

## 2017-08-15 DIAGNOSIS — Z7901 Long term (current) use of anticoagulants: Secondary | ICD-10-CM | POA: Diagnosis not present

## 2017-08-15 DIAGNOSIS — J9611 Chronic respiratory failure with hypoxia: Secondary | ICD-10-CM | POA: Diagnosis present

## 2017-08-15 DIAGNOSIS — K7030 Alcoholic cirrhosis of liver without ascites: Secondary | ICD-10-CM | POA: Diagnosis present

## 2017-08-15 DIAGNOSIS — I482 Chronic atrial fibrillation, unspecified: Secondary | ICD-10-CM

## 2017-08-15 DIAGNOSIS — R748 Abnormal levels of other serum enzymes: Secondary | ICD-10-CM

## 2017-08-15 DIAGNOSIS — H54413A Blindness right eye category 3, normal vision left eye: Secondary | ICD-10-CM | POA: Diagnosis not present

## 2017-08-15 DIAGNOSIS — R279 Unspecified lack of coordination: Secondary | ICD-10-CM | POA: Diagnosis not present

## 2017-08-15 DIAGNOSIS — K2970 Gastritis, unspecified, without bleeding: Secondary | ICD-10-CM | POA: Diagnosis present

## 2017-08-15 DIAGNOSIS — E860 Dehydration: Secondary | ICD-10-CM

## 2017-08-15 DIAGNOSIS — R339 Retention of urine, unspecified: Secondary | ICD-10-CM | POA: Diagnosis present

## 2017-08-15 DIAGNOSIS — D631 Anemia in chronic kidney disease: Secondary | ICD-10-CM | POA: Diagnosis present

## 2017-08-15 DIAGNOSIS — N183 Chronic kidney disease, stage 3 unspecified: Secondary | ICD-10-CM | POA: Diagnosis present

## 2017-08-15 DIAGNOSIS — K219 Gastro-esophageal reflux disease without esophagitis: Secondary | ICD-10-CM | POA: Diagnosis present

## 2017-08-15 DIAGNOSIS — R112 Nausea with vomiting, unspecified: Secondary | ICD-10-CM

## 2017-08-15 DIAGNOSIS — K222 Esophageal obstruction: Secondary | ICD-10-CM | POA: Diagnosis present

## 2017-08-15 DIAGNOSIS — K311 Adult hypertrophic pyloric stenosis: Secondary | ICD-10-CM | POA: Diagnosis not present

## 2017-08-15 DIAGNOSIS — I639 Cerebral infarction, unspecified: Secondary | ICD-10-CM

## 2017-08-15 DIAGNOSIS — E039 Hypothyroidism, unspecified: Secondary | ICD-10-CM | POA: Diagnosis present

## 2017-08-15 DIAGNOSIS — Z23 Encounter for immunization: Secondary | ICD-10-CM | POA: Diagnosis not present

## 2017-08-15 DIAGNOSIS — I2510 Atherosclerotic heart disease of native coronary artery without angina pectoris: Secondary | ICD-10-CM | POA: Diagnosis present

## 2017-08-15 DIAGNOSIS — M109 Gout, unspecified: Secondary | ICD-10-CM | POA: Diagnosis present

## 2017-08-15 DIAGNOSIS — R262 Difficulty in walking, not elsewhere classified: Secondary | ICD-10-CM | POA: Diagnosis not present

## 2017-08-15 DIAGNOSIS — Z8041 Family history of malignant neoplasm of ovary: Secondary | ICD-10-CM

## 2017-08-15 DIAGNOSIS — I447 Left bundle-branch block, unspecified: Secondary | ICD-10-CM | POA: Diagnosis present

## 2017-08-15 DIAGNOSIS — R1311 Dysphagia, oral phase: Secondary | ICD-10-CM | POA: Diagnosis not present

## 2017-08-15 DIAGNOSIS — R1310 Dysphagia, unspecified: Secondary | ICD-10-CM | POA: Diagnosis not present

## 2017-08-15 DIAGNOSIS — E876 Hypokalemia: Secondary | ICD-10-CM | POA: Diagnosis present

## 2017-08-15 DIAGNOSIS — R7401 Elevation of levels of liver transaminase levels: Secondary | ICD-10-CM

## 2017-08-15 DIAGNOSIS — I9589 Other hypotension: Secondary | ICD-10-CM | POA: Diagnosis not present

## 2017-08-15 DIAGNOSIS — R29898 Other symptoms and signs involving the musculoskeletal system: Secondary | ICD-10-CM | POA: Diagnosis not present

## 2017-08-15 DIAGNOSIS — I480 Paroxysmal atrial fibrillation: Secondary | ICD-10-CM | POA: Diagnosis not present

## 2017-08-15 DIAGNOSIS — M6281 Muscle weakness (generalized): Secondary | ICD-10-CM | POA: Diagnosis not present

## 2017-08-15 DIAGNOSIS — N184 Chronic kidney disease, stage 4 (severe): Secondary | ICD-10-CM | POA: Diagnosis not present

## 2017-08-15 DIAGNOSIS — R945 Abnormal results of liver function studies: Secondary | ICD-10-CM | POA: Diagnosis not present

## 2017-08-15 DIAGNOSIS — Z955 Presence of coronary angioplasty implant and graft: Secondary | ICD-10-CM

## 2017-08-15 DIAGNOSIS — R739 Hyperglycemia, unspecified: Secondary | ICD-10-CM | POA: Diagnosis present

## 2017-08-15 DIAGNOSIS — R197 Diarrhea, unspecified: Secondary | ICD-10-CM | POA: Diagnosis present

## 2017-08-15 DIAGNOSIS — R1110 Vomiting, unspecified: Secondary | ICD-10-CM | POA: Diagnosis not present

## 2017-08-15 DIAGNOSIS — E871 Hypo-osmolality and hyponatremia: Secondary | ICD-10-CM | POA: Diagnosis present

## 2017-08-15 DIAGNOSIS — R7989 Other specified abnormal findings of blood chemistry: Secondary | ICD-10-CM | POA: Diagnosis not present

## 2017-08-15 DIAGNOSIS — Z8673 Personal history of transient ischemic attack (TIA), and cerebral infarction without residual deficits: Secondary | ICD-10-CM

## 2017-08-15 DIAGNOSIS — Z7401 Bed confinement status: Secondary | ICD-10-CM | POA: Diagnosis not present

## 2017-08-15 DIAGNOSIS — I129 Hypertensive chronic kidney disease with stage 1 through stage 4 chronic kidney disease, or unspecified chronic kidney disease: Secondary | ICD-10-CM | POA: Diagnosis present

## 2017-08-15 DIAGNOSIS — K110 Atrophy of salivary gland: Secondary | ICD-10-CM | POA: Diagnosis not present

## 2017-08-15 DIAGNOSIS — E861 Hypovolemia: Secondary | ICD-10-CM | POA: Diagnosis not present

## 2017-08-15 DIAGNOSIS — I5032 Chronic diastolic (congestive) heart failure: Secondary | ICD-10-CM | POA: Diagnosis not present

## 2017-08-15 DIAGNOSIS — M545 Low back pain: Secondary | ICD-10-CM | POA: Diagnosis not present

## 2017-08-15 LAB — COMPREHENSIVE METABOLIC PANEL
ALT: 96 U/L — AB (ref 17–63)
AST: 132 U/L — ABNORMAL HIGH (ref 15–41)
Albumin: 3.9 g/dL (ref 3.5–5.0)
Alkaline Phosphatase: 138 U/L — ABNORMAL HIGH (ref 38–126)
Anion gap: 14 (ref 5–15)
BILIRUBIN TOTAL: 1.3 mg/dL — AB (ref 0.3–1.2)
BUN: 67 mg/dL — AB (ref 6–20)
CO2: 33 mmol/L — ABNORMAL HIGH (ref 22–32)
CREATININE: 3.75 mg/dL — AB (ref 0.61–1.24)
Calcium: 10 mg/dL (ref 8.9–10.3)
Chloride: 86 mmol/L — ABNORMAL LOW (ref 101–111)
GFR, EST AFRICAN AMERICAN: 17 mL/min — AB (ref >60)
GFR, EST NON AFRICAN AMERICAN: 14 mL/min — AB (ref >60)
Glucose, Bld: 142 mg/dL — ABNORMAL HIGH (ref 65–99)
Potassium: 3.1 mmol/L — ABNORMAL LOW (ref 3.5–5.1)
Sodium: 133 mmol/L — ABNORMAL LOW (ref 135–145)
TOTAL PROTEIN: 8.2 g/dL — AB (ref 6.5–8.1)

## 2017-08-15 LAB — CBC WITH DIFFERENTIAL/PLATELET
Basophils Absolute: 0 10*3/uL (ref 0.0–0.1)
Basophils Relative: 0 %
EOS ABS: 0.2 10*3/uL (ref 0.0–0.7)
EOS PCT: 4 %
HCT: 41.1 % (ref 39.0–52.0)
Hemoglobin: 13.4 g/dL (ref 13.0–17.0)
LYMPHS ABS: 1.5 10*3/uL (ref 0.7–4.0)
Lymphocytes Relative: 26 %
MCH: 28.9 pg (ref 26.0–34.0)
MCHC: 32.6 g/dL (ref 30.0–36.0)
MCV: 88.6 fL (ref 78.0–100.0)
Monocytes Absolute: 0.7 10*3/uL (ref 0.1–1.0)
Monocytes Relative: 13 %
Neutro Abs: 3.2 10*3/uL (ref 1.7–7.7)
Neutrophils Relative %: 57 %
PLATELETS: 170 10*3/uL (ref 150–400)
RBC: 4.64 MIL/uL (ref 4.22–5.81)
RDW: 17.4 % — AB (ref 11.5–15.5)
WBC: 5.7 10*3/uL (ref 4.0–10.5)

## 2017-08-15 LAB — VITAMIN B12: Vitamin B-12: 1333 pg/mL — ABNORMAL HIGH (ref 180–914)

## 2017-08-15 LAB — TROPONIN I
Troponin I: 0.04 ng/mL (ref <0.03)
Troponin I: 0.03 ng/mL (ref <0.03)

## 2017-08-15 LAB — LACTIC ACID, PLASMA: Lactic Acid, Venous: 1 mmol/L (ref 0.5–1.9)

## 2017-08-15 LAB — HEMOGLOBIN A1C
Hgb A1c MFr Bld: 5.1 % (ref 4.8–5.6)
Mean Plasma Glucose: 99.67 mg/dL

## 2017-08-15 LAB — MRSA PCR SCREENING: MRSA by PCR: NEGATIVE

## 2017-08-15 LAB — LIPASE, BLOOD: LIPASE: 45 U/L (ref 11–51)

## 2017-08-15 LAB — TSH: TSH: 6.239 u[IU]/mL — AB (ref 0.350–4.500)

## 2017-08-15 LAB — PROTIME-INR
INR: 1.08
PROTHROMBIN TIME: 13.9 s (ref 11.4–15.2)

## 2017-08-15 MED ORDER — HEPARIN SODIUM (PORCINE) 5000 UNIT/ML IJ SOLN
5000.0000 [IU] | Freq: Three times a day (TID) | INTRAMUSCULAR | Status: DC
  Administered 2017-08-16 (×2): 5000 [IU] via SUBCUTANEOUS
  Filled 2017-08-15 (×2): qty 1

## 2017-08-15 MED ORDER — ONDANSETRON HCL 4 MG/2ML IJ SOLN
4.0000 mg | Freq: Four times a day (QID) | INTRAMUSCULAR | Status: DC | PRN
Start: 2017-08-15 — End: 2017-08-19
  Administered 2017-08-15: 4 mg via INTRAVENOUS
  Filled 2017-08-15: qty 2

## 2017-08-15 MED ORDER — ACETAMINOPHEN 650 MG RE SUPP: 650.0000 mg | Freq: Four times a day (QID) | RECTAL | Status: DC | PRN

## 2017-08-15 MED ORDER — ADULT MULTIVITAMIN W/MINERALS CH
1.0000 | ORAL_TABLET | Freq: Every day | ORAL | Status: DC
  Administered 2017-08-16 – 2017-08-19 (×4): 1 via ORAL
  Filled 2017-08-15 (×7): qty 1

## 2017-08-15 MED ORDER — VITAMIN C 500 MG PO TABS
500.0000 mg | ORAL_TABLET | Freq: Every day | ORAL | Status: DC
  Administered 2017-08-16 – 2017-08-19 (×4): 500 mg via ORAL
  Filled 2017-08-15 (×7): qty 1

## 2017-08-15 MED ORDER — FOLIC ACID 1 MG PO TABS
1.0000 mg | ORAL_TABLET | Freq: Every day | ORAL | Status: DC
  Administered 2017-08-16 – 2017-08-19 (×4): 1 mg via ORAL
  Filled 2017-08-15 (×4): qty 1

## 2017-08-15 MED ORDER — FAMOTIDINE 20 MG PO TABS
20.0000 mg | ORAL_TABLET | Freq: Every day | ORAL | Status: DC
  Administered 2017-08-15 – 2017-08-18 (×4): 20 mg via ORAL
  Filled 2017-08-15 (×4): qty 1

## 2017-08-15 MED ORDER — CHOLECALCIFEROL 10 MCG (400 UNIT) PO TABS
400.0000 [IU] | ORAL_TABLET | Freq: Two times a day (BID) | ORAL | Status: DC
  Administered 2017-08-16 – 2017-08-19 (×8): 400 [IU] via ORAL
  Filled 2017-08-15 (×8): qty 1

## 2017-08-15 MED ORDER — VITAMIN D2 10 MCG (400 UNIT) PO TABS: 1.0000 | ORAL_TABLET | Freq: Two times a day (BID) | ORAL | Status: DC

## 2017-08-15 MED ORDER — INFLUENZA VAC SPLIT HIGH-DOSE 0.5 ML IM SUSY
0.5000 mL | PREFILLED_SYRINGE | INTRAMUSCULAR | Status: AC
  Administered 2017-08-16: 0.5 mL via INTRAMUSCULAR
  Filled 2017-08-15: qty 0.5

## 2017-08-15 MED ORDER — TRAMADOL HCL 50 MG PO TABS
50.0000 mg | ORAL_TABLET | Freq: Four times a day (QID) | ORAL | Status: DC | PRN
  Administered 2017-08-15 – 2017-08-19 (×6): 50 mg via ORAL
  Filled 2017-08-15 (×6): qty 1

## 2017-08-15 MED ORDER — PANTOPRAZOLE SODIUM 40 MG IV SOLR
40.0000 mg | Freq: Two times a day (BID) | INTRAVENOUS | Status: DC
  Administered 2017-08-15 – 2017-08-16 (×2): 40 mg via INTRAVENOUS
  Filled 2017-08-15 (×2): qty 40

## 2017-08-15 MED ORDER — LEVOTHYROXINE SODIUM 75 MCG PO TABS
75.0000 ug | ORAL_TABLET | Freq: Every day | ORAL | Status: DC
  Administered 2017-08-16 – 2017-08-19 (×4): 75 ug via ORAL
  Filled 2017-08-15 (×4): qty 1

## 2017-08-15 MED ORDER — ONDANSETRON HCL 4 MG PO TABS: 4.0000 mg | ORAL_TABLET | Freq: Four times a day (QID) | ORAL | Status: DC | PRN

## 2017-08-15 MED ORDER — SODIUM CHLORIDE 0.9 % IV BOLUS (SEPSIS)
1000.0000 mL | Freq: Once | INTRAVENOUS | Status: AC
  Administered 2017-08-15: 1000 mL via INTRAVENOUS

## 2017-08-15 MED ORDER — MELATONIN 3 MG PO TABS: 1.0000 | ORAL_TABLET | Freq: Every day | ORAL | Status: DC

## 2017-08-15 MED ORDER — PNEUMOCOCCAL VAC POLYVALENT 25 MCG/0.5ML IJ INJ
0.5000 mL | INJECTION | INTRAMUSCULAR | Status: AC
  Administered 2017-08-16: 0.5 mL via INTRAMUSCULAR
  Filled 2017-08-15: qty 0.5

## 2017-08-15 MED ORDER — ONDANSETRON HCL 4 MG/2ML IJ SOLN
4.0000 mg | Freq: Once | INTRAMUSCULAR | Status: AC
  Administered 2017-08-15: 4 mg via INTRAVENOUS
  Filled 2017-08-15: qty 2

## 2017-08-15 MED ORDER — POTASSIUM CHLORIDE IN NACL 40-0.9 MEQ/L-% IV SOLN
INTRAVENOUS | Status: DC
  Administered 2017-08-15: 100 mL/h via INTRAVENOUS
  Administered 2017-08-17 – 2017-08-18 (×2): 50 mL/h via INTRAVENOUS

## 2017-08-15 MED ORDER — VITAMIN B-1 100 MG PO TABS
100.0000 mg | ORAL_TABLET | Freq: Every day | ORAL | Status: DC
  Administered 2017-08-15 – 2017-08-19 (×5): 100 mg via ORAL
  Filled 2017-08-15 (×5): qty 1

## 2017-08-15 MED ORDER — BOOST / RESOURCE BREEZE PO LIQD
1.0000 | Freq: Three times a day (TID) | ORAL | Status: DC
  Administered 2017-08-15 – 2017-08-16 (×2): 1 via ORAL

## 2017-08-15 MED ORDER — CLOPIDOGREL BISULFATE 75 MG PO TABS
75.0000 mg | ORAL_TABLET | Freq: Every day | ORAL | Status: DC
  Administered 2017-08-15 – 2017-08-19 (×5): 75 mg via ORAL
  Filled 2017-08-15 (×5): qty 1

## 2017-08-15 MED ORDER — ACETAMINOPHEN 325 MG PO TABS: 650.0000 mg | ORAL_TABLET | Freq: Four times a day (QID) | ORAL | Status: DC | PRN

## 2017-08-15 NOTE — ED Notes (Signed)
NS liter bolus started due to low BP

## 2017-08-15 NOTE — Assessment & Plan Note (Signed)
Patient has a history of cirrhosis. He has not been seen by [place] for over 2 years. Reviewing his recent labs indicates he might have some decompensation of his liver. He was previously drinking. Is not drinking currently at his rehabilitation Center. He is due for abdominal imaging and liver labs including CBC, CMP, PTT/INR, AFP. I will into these orders in the system and hopefully they can be drawn while he is admitted, if he is admitted. I will also order a right upper quadrant ultrasound to have this on board for him to complete as she is able to either as inpatient or outpatient. We'll need six-month follow-up related to cirrhosis.

## 2017-08-15 NOTE — ED Notes (Signed)
Troponin 0.04 reported to EDP.

## 2017-08-15 NOTE — ED Provider Notes (Signed)
[facility] DEPT Provider Note   CSN: [id number] Arrival date & time: 08/15/17  1101     History   Chief Complaint Chief Complaint  Patient presents with  . Emesis    HPI Justin Parsons is a 76 y.o. male.  HPI 76 year old male who presents with nausea and vomiting. 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 Reports 2 months of ongoing intractable nausea, vomiting, and dry heaves.Has had a 50 pound weight loss during this period of time. Was seen by GI today, which he has been lost to follow-up for about 2 years now, and subsequently sent to the ED for hypotension and intractable vomiting. Denies any melena, hematochezia, diarrhea, abdominal pain, fevers or chills, cough, difficulty breathing, or chest pain. Has not tried any treatments or medications for symptoms.   Past Medical History:  Diagnosis Date  . Adenomatous polyp of colon   . Alcoholism ([place])    [place]  . Arthritis   . Atrial fibrillation ([place])   . Blindness of right eye 1997  . CAD (coronary artery disease)    Multivessel disease at cardiac catheterization June 2018, DES to LAD June 2018 with significant residual disease including diagonal and obtuse marginal vessels as well as RCA  . Cirrhosis of liver ([facility])    Associated portal gastropathy  . CKD (chronic kidney disease) stage 3, GFR 30-59 ml/min   . Essential hypertension   . Hiatal hernia   . Hypothyroidism   . 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 TIA (transient ischemic attack)     Patient Active Problem List   Diagnosis Date Noted  . Nausea with vomiting 08/15/2017  . Coronary artery disease involving native coronary artery of native heart with angina pectoris ([place])   . Tobacco abuse   . Acute renal failure superimposed on stage 3 chronic kidney disease ([place])   . Alcohol use disorder ([place])   . Non-ST  elevation (NSTEMI) myocardial infarction ([place]) 04/29/2017  . Delirium tremens ([place])   . Shock circulatory ([place])   . Confusion   . Acute encephalopathy 04/27/2017  . Schatzki's ring   . Esophageal varices ([place])   . History of esophageal stricture 01/20/2015  . Esophageal stricture   . History of colonic polyps   . Dysphagia, pharyngoesophageal phase 11/04/2014  . Suicidal ideation 11/17/2013  . Acute renal failure ([place]) 11/17/2013  . Acute diastolic CHF (congestive heart failure) ([place]) 11/13/2013  . Acute respiratory failure ([place]) 11/13/2013  . Community acquired pneumonia 11/12/2013  . A-fib ([place]) 11/12/2013  . CKD (chronic kidney disease), stage III 11/12/2013  . Unspecified hypothyroidism 11/12/2013  . Dyspnea 11/12/2013  . Thrombocytopenia ([place]) 06/05/2012  . ARF (acute renal failure) ([place]) 06/04/2012  . Obesity 06/04/2012  . Cirrhosis ([place]) 01/29/2012  . Adenomatous polyp 01/29/2012  . Renal insufficiency 05/01/2011  . Anemia 05/01/2011  . Rectal bleed 05/01/2011  . GERD (gastroesophageal reflux disease) 05/01/2011    Past Surgical History:  Procedure Laterality Date  . CARDIAC CATHETERIZATION  2003  . COLONOSCOPY  08/2008   normal, repeat exam in 5-7 years  . COLONOSCOPY  2004   rectal adenomatous polyp  . COLONOSCOPY N/A 12/01/2014   VQM:GQQPYP rectum/elongated colon  . CORONARY ATHERECTOMY N/A 05/03/2017   Procedure: Coronary Atherectomy;  Surgeon: Leonie Man, MD;  Location: [place] [facility];  Service: Cardiovascular;  Laterality: N/A;  . CORONARY STENT INTERVENTION N/A 05/03/2017  Procedure: Coronary Stent Intervention;  Surgeon: Leonie Man, MD;  Location: [place] [facility];  Service: Cardiovascular;  Laterality: N/A;  . ESOPHAGEAL DILATION N/A 02/07/2015   Procedure: ESOPHAGEAL DILATION;  Surgeon: Daneil Dolin, MD;  [facility];  Service: Endoscopy;  Laterality: N/A;  . ESOPHAGOGASTRODUODENOSCOPY  02/11/2012   Dr. Gala Romney: portal  gastropathy, gastric erosions, esophageal ulcerations likely pill-induced, surveillance in 2 years  . ESOPHAGOGASTRODUODENOSCOPY N/A 12/01/2014   Dr. Volney American esophageal stricture dilated with the scope passage, portal gastropathy, negative H.pylori on gastric biopsies, esophageal biopsies benign  . ESOPHAGOGASTRODUODENOSCOPY N/A 02/07/2015   Procedure: ESOPHAGOGASTRODUODENOSCOPY (EGD);  Surgeon: Daneil Dolin, MD;  [facility];  Service: Endoscopy;  Laterality: N/A;  1115  . EYE SURGERY    . JOINT REPLACEMENT    . LEFT HEART CATH AND CORONARY ANGIOGRAPHY N/A 05/01/2017   Procedure: Left Heart Cath and Coronary Angiography;  Surgeon: Leonie Man, MD;  Location: [facility] [facility];  Service: Cardiovascular;  Laterality: N/A;  . s/p eye implant  1997   artificial eye, right  . TOTAL HIP ARTHROPLASTY  2002  . TOTAL HIP ARTHROPLASTY  2006/2012   revision in 2012  . TOTAL KNEE ARTHROPLASTY  1999/2003   left/right       Home Medications    Prior to Admission medications   Medication Sig Start Date End Date Taking? Authorizing Provider  calcium carbonate (TUMS - DOSED IN MG ELEMENTAL CALCIUM) 500 MG chewable tablet Chew 1 tablet by mouth 2 (two) times daily.   Yes [provider]  carvedilol (COREG) 6.25 MG tablet Take 1 tablet (6.25 mg total) by mouth 2 (two) times daily with a meal. 05/06/17  Yes Barton Dubois, MD  clopidogrel (PLAVIX) 75 MG tablet Take 1 tablet (75 mg total) by mouth daily. 05/07/17  Yes Barton Dubois, MD  colchicine 0.6 MG tablet Take 0.6 mg by mouth 2 (two) times daily.   Yes [provider]  docusate sodium (COLACE) 100 MG capsule Take 1 capsule (100 mg total) by mouth 2 (two) times daily. Patient taking differently: Take 100 mg by mouth daily.  05/06/17  Yes Barton Dubois, MD  Ergocalciferol (VITAMIN D2) 400 units TABS Take 1 tablet by mouth 2 (two) times daily.   Yes [provider]  folic acid (FOLVITE) 1 MG tablet Take  1 tablet (1 mg total) by mouth daily. 05/07/17  Yes Barton Dubois, MD  furosemide (LASIX) 40 MG tablet Take 40 mg by mouth 2 (two) times daily.   Yes [provider]  levothyroxine (SYNTHROID, LEVOTHROID) 75 MCG tablet Take 75 mcg by mouth daily before breakfast.   Yes [provider]  Melatonin 3 MG TABS Take 1 tablet by mouth at bedtime.   Yes [provider]  Multiple Vitamin (MULTIVITAMIN) tablet Take 1 tablet by mouth daily.   Yes [provider]  omeprazole (PRILOSEC) 40 MG capsule Take 1 capsule (40 mg total) by mouth 2 (two) times daily. 03/12/16  Yes Carlis Stable, NP  ondansetron (ZOFRAN) 4 MG tablet Take 4 mg by mouth every 8 (eight) hours as needed for nausea or vomiting.   Yes [provider]  polyethylene glycol (MIRALAX / GLYCOLAX) packet Take 17 g by mouth daily as needed for mild constipation. Patient taking differently: Take 17 g by mouth daily.  05/06/17  Yes Barton Dubois, MD  ranitidine (ZANTAC) 75 MG tablet Take 75 mg by mouth 2 (two) times daily.  Yes [provider]  thiamine 100 MG tablet Take 1 tablet (100 mg total) by mouth daily. 05/07/17  Yes Barton Dubois, MD  traMADol (ULTRAM) 50 MG tablet Take 50 mg by mouth every 6 (six) hours as needed.   Yes [provider]  vitamin C (ASCORBIC ACID) 500 MG tablet Take 500 mg by mouth daily.   Yes [provider]    Family History Family History  Problem Relation Age of Onset  . Ovarian cancer Sister   . Colon cancer Neg Hx     Social History Social History  Substance Use Topics  . Smoking status: Never Smoker  . Smokeless tobacco: Never Used     Comment: used to chew tobacco, none in 15 years  . Alcohol use 0.0 oz/week     Comment: no alcohol at present-in nursing home; half a gallon moonshine per week     Allergies   Patient has no known allergies.   Review of Systems Review of Systems  Constitutional: Negative for fever.  Respiratory:  Negative for shortness of breath.   Cardiovascular: Negative for chest pain.  Gastrointestinal: Positive for nausea and vomiting. Negative for abdominal pain.  All other systems reviewed and are negative.    Physical Exam Updated Vital Signs BP (!) 87/62   Pulse 84   Temp 98.7 F (37.1 C) (Oral)   Resp (!) 22   Ht 5' 9"  (1.753 m)   Wt 106.1 kg (234 lb)   SpO2 100%   BMI 34.56 kg/m   Physical Exam Physical Exam  Nursing note and vitals reviewed. Constitutional: Pale, chronically ill appearing, non-toxic, and in no acute distress Head: Normocephalic and atraumatic.  Mouth/Throat: Oropharynx is clear and moist.  Neck: Normal range of motion. Neck supple.  Cardiovascular: Normal rate and regular rhythm.   Pulmonary/Chest: Effort normal and breath sounds normal.  Abdominal: Soft. Moderate distension. There is no tenderness. There is no rebound and no guarding.  Musculoskeletal: Normal range of motion.  Neurological: Alert, no facial droop, fluent speech, moves all extremities symmetrically Skin: Skin is warm and dry.  Psychiatric: Cooperative   ED Treatments / Results  Labs (all labs ordered are listed, but only abnormal results are displayed) Labs Reviewed  CBC WITH DIFFERENTIAL/PLATELET - Abnormal; Notable for the following:       Result Value   RDW 17.4 (*)    All other components within normal limits  COMPREHENSIVE METABOLIC PANEL - Abnormal; Notable for the following:    Sodium 133 (*)    Potassium 3.1 (*)    Chloride 86 (*)    CO2 33 (*)    Glucose, Bld 142 (*)    BUN 67 (*)    Creatinine, Ser 3.75 (*)    Total Protein 8.2 (*)    AST 132 (*)    ALT 96 (*)    Alkaline Phosphatase 138 (*)    Total Bilirubin 1.3 (*)    GFR calc non Af Amer 14 (*)    GFR calc Af Amer 17 (*)    All other components within normal limits  TROPONIN I - Abnormal; Notable for the following:    Troponin I 0.04 (*)    All other components within normal limits  LIPASE, BLOOD    PROTIME-INR  URINALYSIS, ROUTINE W REFLEX MICROSCOPIC  I-STAT TROPONIN, ED    EKG  EKG Interpretation  Date/Time:  Thursday August 15 2017 11:18:33 EDT Ventricular Rate:  133 PR Interval:    QRS Duration:  123 QT Interval:  410 QTC Calculation: 610 R Axis:   -38 Text Interpretation:  Atrial fibrillation Paired ventricular premature complexes Aberrant conduction of SV complex(es) Left bundle branch block TECHNICALLY DIFFICULT likely sinus rhythm Baseline wander Confirmed by Brantley Stage [phone]) on 08/15/2017 11:35:32 AM       Radiology No results found.  Procedures Procedures (including critical care time) CRITICAL CARE Performed by: Forde Dandy   Total critical care time: 31 minutes  Critical care time was exclusive of separately billable procedures and treating other patients.  Critical care was necessary to treat or prevent imminent or life-threatening deterioration.  Critical care was time spent personally by me on the following activities: development of treatment plan with patient and/or surrogate as well as nursing, discussions with consultants, evaluation of patient's response to treatment, examination of patient, obtaining history from patient or surrogate, ordering and performing treatments and interventions, ordering and review of laboratory studies, ordering and review of radiographic studies, pulse oximetry and re-evaluation of patient's condition.  Medications Ordered in ED Medications  sodium chloride 0.9 % bolus 1,000 mL (0 mLs Intravenous Stopped 08/15/17 1354)  ondansetron (ZOFRAN) injection 4 mg (4 mg Intravenous Given 08/15/17 1238)  sodium chloride 0.9 % bolus 1,000 mL (1,000 mLs Intravenous New Bag/Given 08/15/17 1354)     Initial Impression / Assessment and Plan / ED Course  I have reviewed the triage vital signs and the nursing notes.  Pertinent labs & imaging results that were available during my care of the patient were reviewed by me and  considered in my medical decision making (see chart for details).     Assessment hypotensive in the emergency department systolic blood pressures in the 80s to 90s. Appears very dehydrated on exam and on blood work. Mentation is normal. With evidence of worsening renal function, mild electrolyte derangements. Abdomen is soft and benign, but with mildly elevated LFTs. Right upper quadrant ultrasound is ordered and pending, per GI request.I will continue to resuscitate with IV fluids given hypotension is likely related to dehydration. Will likely require form GI consultation in the hospital. Discussed with Dr. Carles Collet who will admit for ongoing work-up and management.  Final Clinical Impressions(s) / ED Diagnoses   Final diagnoses:  Abnormal liver enzymes  Intractable vomiting with nausea, unspecified vomiting type  Acute kidney injury [facility])    New Prescriptions New Prescriptions   No medications on file     Forde Dandy, MD 08/15/17 1359

## 2017-08-15 NOTE — Patient Instructions (Signed)
1. Proceed to the emergency department. 2. I put in orders for labs and imaging related to your liver disease which will be either completed while here in the hospital or after you get out. 3. Call if you have any questions or concerns.

## 2017-08-15 NOTE — Progress Notes (Signed)
Referring Provider: Monico Blitz, MD Primary Care Physician:  Monico Blitz, MD Primary GI:  Dr. Gala Romney  Chief Complaint  Patient presents with  . Emesis    HPI:   Justin Parsons is a 76 y.o. male who presents From the nursing home with complaints of vomiting. The patient was last seen in our office 01/20/2015 for history of esophageal stricture and alcoholic cirrhosis without ascites. Per previous note, history of Karlene Lineman cirrhosis plus/minus EtOH. Recently underwent EGD found distal esophageal stricture likely secondary to reflux or pill-induced esophagitis, no varices noted, portal gastropathy present. Dilation was performed. At his last visit he was on Prilosec daily and instructed to increase to twice daily but has not done so. One episode of choking but much improved overall. At that time he slacked up with quit drinking moonshine but still drinks. Still on Voltaren twice daily at that time despite strong recommendations to stop taking it. In the process of completing hepatitis A and B vaccinations. He was due for ultrasound and blood work at that time. Recommended repeat upper endoscopy with dilation, continue Prilosec, liver labs and imaging.  Repeat EGD completed 02/07/2015 which found Schatzki's Ring with proximal peptic stricture status post dilation, hiatal hernia, portal gastropathy. Recommended stop drinking alcohol and using NSAIDs. Continue PPI, follow up in 3 months.  Laboratory results reviewed. INR 1.05, acute hepatitis panel negative, AFP normal, CMP with elevated creatinine at 1.87, mildly elevated ALT at 54, otherwise AST, alkaline phosphatase, bilirubin normal. Based on labs his MELD score was 13, child Pugh class A.  Liver ultrasound completed 01/25/2015 which found cirrhosis but no focal hepatic lesion.  He was recently admitted for stroke from 04/27/2017 through 05/06/2017. He was discharged to [facility] for [facility] and recommended follow-up with primary care and  cardiology.  Labs after his discharge do not appear to been completed. Most recent CMP showed sodium 137, acute kidney injury type creatinine 2.42 (improved from 3.41), decline in albumin to 3.1, normal AST/ALT/alkaline phosphatase/bilirubin. Most recent INR 1.20. Most recent CBC with hemoglobin 11.5, platelets 132.  He is a poor historian. Today he states he's not doing well. States he didn't have a stroke. He is having significant vomiting after eating. Has had some N/V for the past 2 months but at this point unable to keep down food or fluids. Still with some swallowing problems but significantly improved. Dysphagia about once a month. Denies hematemesis. Asked if he gets nauseated prior to vomiting and states  I feel like I am going to pass out when it happens." I asked again and he says "Its all along the same trail. I got dry heaves now." Denies hematochezia, melena. Feels like he's lost 50 lbs unintentionally. States he's completed his Hepatitis vaccine series. Unable to answer about any GERD symptoms ("Aint nothing but my damn gut ain't right.) States he cannot eat or drink anything.  No notes provided by facility to indicate frequency or duration of vomiting.  Past Medical History:  Diagnosis Date  . Adenomatous polyp of colon   . 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 Arthritis   . Atrial fibrillation ([place])   . Blindness of right eye 1997  . CAD (coronary artery disease)    Multivessel disease at cardiac catheterization June 2018, DES to LAD June 2018 with significant residual disease including diagonal and obtuse marginal vessels as well as RCA  . Cirrhosis of liver (HCC)    Associated portal gastropathy  . CKD (chronic kidney disease)  stage 3, GFR 30-59 ml/min   . Essential hypertension   . Hiatal hernia   . Hypothyroidism   . 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 TIA (transient ischemic attack)     Past Surgical History:  Procedure Laterality Date  . CARDIAC CATHETERIZATION  2003    . COLONOSCOPY  08/2008   normal, repeat exam in 5-7 years  . COLONOSCOPY  2004   rectal adenomatous polyp  . COLONOSCOPY N/A 12/01/2014   [id number] rectum/elongated colon  . CORONARY ATHERECTOMY N/A 05/03/2017   Procedure: Coronary Atherectomy;  Surgeon: Leonie Man, MD;  Location: [facility];  Service: Cardiovascular;  Laterality: N/A;  . CORONARY STENT INTERVENTION N/A 05/03/2017   Procedure: Coronary Stent Intervention;  Surgeon: Leonie Man, MD;  Location: [facility];  Service: Cardiovascular;  Laterality: N/A;  . ESOPHAGEAL DILATION N/A 02/07/2015   Procedure: ESOPHAGEAL DILATION;  Surgeon: Daneil Dolin, MD;  [facility];  Service: Endoscopy;  Laterality: N/A;  . ESOPHAGOGASTRODUODENOSCOPY  02/11/2012   Dr. Gala Romney: portal gastropathy, gastric erosions, esophageal ulcerations likely pill-induced, surveillance in 2 years  . ESOPHAGOGASTRODUODENOSCOPY N/A 12/01/2014   Dr. Volney American esophageal stricture dilated with the scope passage, portal gastropathy, negative H.pylori on gastric biopsies, esophageal biopsies benign  . ESOPHAGOGASTRODUODENOSCOPY N/A 02/07/2015   Procedure: ESOPHAGOGASTRODUODENOSCOPY (EGD);  Surgeon: Daneil Dolin, MD;  [facility];  Service: Endoscopy;  Laterality: N/A;  1115  . EYE SURGERY    . JOINT REPLACEMENT    . LEFT HEART CATH AND CORONARY ANGIOGRAPHY N/A 05/01/2017   Procedure: Left Heart Cath and Coronary Angiography;  Surgeon: Leonie Man, MD;  Location: [facility];  Service: Cardiovascular;  Laterality: N/A;  . s/p eye implant  1997   artificial eye, right  . TOTAL HIP ARTHROPLASTY  2002  . TOTAL HIP ARTHROPLASTY  2006/2012   revision in 2012  . TOTAL KNEE ARTHROPLASTY  1999/2003   left/right    Current Outpatient Prescriptions  Medication Sig Dispense Refill  . allopurinol (ZYLOPRIM) 100 MG tablet Take 100 mg by mouth daily.      . Ascorbic Acid (VITAMIN C PO) Take 1 tablet by mouth  daily.    . Calcium Oyster Shell 500 MG TABS Take 1 tablet by mouth 2 (two) times daily.    . carvedilol (COREG) 6.25 MG tablet Take 1 tablet (6.25 mg total) by mouth 2 (two) times daily with a meal.    . clopidogrel (PLAVIX) 75 MG tablet Take 1 tablet (75 mg total) by mouth daily.    . colchicine 0.6 MG tablet Take 0.6 mg by mouth every 6 (six) hours as needed.    . docusate sodium (COLACE) 100 MG capsule Take 1 capsule (100 mg total) by mouth 2 (two) times daily. (Patient taking differently: Take 100 mg by mouth daily. )    . Ergocalciferol (VITAMIN D2) 400 units TABS Take 1 tablet by mouth 2 (two) times daily.    . folic acid (FOLVITE) 1 MG tablet Take 1 tablet (1 mg total) by mouth daily.    . furosemide (LASIX) 40 MG tablet Take 40 mg by mouth 2 (two) times daily.    Marland Kitchen levothyroxine (SYNTHROID, LEVOTHROID) 75 MCG tablet Take 75 mcg by mouth daily before breakfast.    . Melatonin 3 MG TABS Take 1 tablet by mouth at bedtime.    . Multiple Vitamin (MULTIVITAMIN) tablet Take 1 tablet by mouth daily.    Marland Kitchen omeprazole (  PRILOSEC) 40 MG capsule Take 1 capsule (40 mg total) by mouth 2 (two) times daily. 60 capsule 5  . ondansetron (ZOFRAN) 4 MG tablet Take 4 mg by mouth every 8 (eight) hours as needed for nausea or vomiting.    . polyethylene glycol (MIRALAX / GLYCOLAX) packet Take 17 g by mouth daily as needed for mild constipation. (Patient taking differently: Take 17 g by mouth daily. )    . ranitidine (ZANTAC) 75 MG tablet Take 75 mg by mouth 2 (two) times daily.    Marland Kitchen thiamine 100 MG tablet Take 1 tablet (100 mg total) by mouth daily.     No current facility-administered medications for this visit.     Allergies as of 08/15/2017  . (No Known Allergies)    Family History  Problem Relation Age of Onset  . Ovarian cancer Sister   . Colon cancer Neg Hx     Social History   Social History  . Marital status: Divorced    Spouse name: N/A  . Number of children: N/A  . Years of education:  N/A   Social History Main Topics  . Smoking status: Never Smoker  . Smokeless tobacco: Never Used     Comment: used to chew tobacco, none in 15 years  . Alcohol use 0.0 oz/week     Comment: no alcohol at present-in nursing home; half a gallon moonshine per week  . Drug use: No  . Sexual activity: Not Currently   Other Topics Concern  . None   Social History Narrative   One son deceased while in prison, drug addiction.    Review of Systems: General: Negative for anorexia. Admits subjective weight loss and weakness. ENT: Negative for hoarseness. Some rare/intermittent dysphagia. CV: Negative for chest pain, angina, palpitations, peripheral edema.  Respiratory: Negative for dyspnea at rest, cough, sputum, wheezing.  GI: See history of present illness. Endo: Negative for unusual weight change.  Heme: Negative for bruising or bleeding. Allergy: Negative for rash or hives.   Physical Exam: BP (!) 88/68   Pulse 90   Temp (!) 97 F (36.1 C) (Oral)   Ht 5' 8"  (1.727 m)  General:   Alert and oriented. Pleasant and cooperative. Well-nourished and well-developed. In a wheelchair. Head:  Normocephalic and atraumatic. Eyes:  Without icterus, sclera clear and conjunctiva pink.  Ears:  Normal auditory acuity. Cardiovascular:  S1, S2 present without murmurs appreciated. Normal pulses noted. Extremities without clubbing or edema. Respiratory:  Clear to auscultation bilaterally. No wheezes, rales, or rhonchi. No distress.  Gastrointestinal:  +BS, soft, non-tender and non-distended. No HSM noted. No guarding or rebound. No masses appreciated. Noted to be dry heaving on and off for the duration of the visit. Rectal:  Deferred  Musculoskalatal:  Symmetrical without gross deformities. In a wheelchair. Skin:  Intact without significant lesions or rashes. Neurologic:  Alert and oriented x4;  grossly normal neurologically. Psych:  Alert and cooperative. Normal mood and affect. Heme/Lymph/Immune:  No excessive bruising noted.    08/15/2017 10:45 AM   Disclaimer: This note was dictated with voice recognition software. Similar sounding words can inadvertently be transcribed and may not be corrected upon review.

## 2017-08-15 NOTE — Assessment & Plan Note (Signed)
The patient describes significant and persistent nausea and vomiting. States she is unable to eat or drink anything. He feels he has lost 50 pounds, unable to stand to assess his weight. He is having some weakness as well. He was noted to be dry heaving throughout the duration of his visit. His blood pressure is a bit soft at 88/68. His heart rate is 90.   Attempted to contact the facility to find obliteration of his symptoms, frequency of his symptoms and they have observed it, and any oral intake is been able to have. I was unable to be connected to a nurse. No records provided from the facility to indicate the answers to these questions.  At this point given intractable nausea and vomiting, unable to keep down food or fluids, and no response from the facility related to their assessment of his symptoms I will contact the hospitalist service to attempt to have him directly admitted for intractable nausea and vomiting, dehydration, inability to tolerate food and fluids.

## 2017-08-15 NOTE — ED Notes (Signed)
ECG given to Dr. Oleta Mouse

## 2017-08-15 NOTE — ED Notes (Signed)
ED Provider at bedside. 

## 2017-08-15 NOTE — H&P (Signed)
History and Physical  Justin Parsons VEH:[id number] DOB: 1941-11-03 DOA: 08/15/2017   PCP: Monico Blitz, MD   Patient coming from: SNF Chief Complaint: vomiting diarrhea  HPI:  Justin Parsons is a 76 y.o. male with medical history of coronary artery disease with NSTEMI June 2018, DES to ostial LAD 05/03/17, atrial fibrillation, CKD stage III, esophageal stricture, alcoholic cirrhosis, portal gastropathy, hypothyroidism presenting from the GI office secondary to hypotension. The patient has been complaining of two-month history of nausea and vomiting. The patient has had difficulty with liquids as well as solids. He has intermittent dysphagia. He states that he vomits shortly after drinking or eating anything. He has lost 50 pounds over the past 2-3 months. He complains of increasing generalized weakness to the point where he has had to use a walker and wheelchair. He denies any fevers, chills, chest pain, worsening shortness of breath, dysuria, hematuria, hematemesis, hematochezia, melena, abdominal pain. However, he states that his stomach does feel "funny and grinding".  He denies any recent travels or eating any exotic foods. The patient had an EGD performed on 02/07/2015 which revealed a Schatzki's ring with a proximal peptic stricture status post dilatation and portal gastropathy. The patient was lost to follow-up with regard to his gastroenterologist until 08/15/2017. In the office, the patient was noted to have generalized weakness with hypotension. As result, the patient was sent to the emergency department for further evaluation. He states that he quit using alcohol 1 year ago. He has never smoked. The patient was admitted to the hospital from 04/27/2017 through 05/06/2017 when the patient was treated for NSTEMI and hypotension.  DES was placed in ostial LAD at that time, the patient was started with DAPT. He states that he was discharged with 2 L nasal cannula that time and has  remained on 2 L since then.  In the emergency room, the patient was afebrile, but was mildly hypotensive with systolic blood pressures in the upper 80s and low 90s. The patient was given 2 L NS. WBC was 5.7 with hemoglobin 13.4, platelets 170,000. AST 132, ALT 96, alkaline phosphatase is 130, total bilirubin 1.3, albumin 3.9, lipase 45. BMP showed a sodium 133, potassium 3.1, serum creatinine 3.75 which is above his normal baseline.  Assessment/Plan: Hypotension -Likely secondary to volume depletion and intractable vomiting -Continue IV fluids -Check lactic acid -Blood cultures 2 sets -UA and urine culture -hold coreg and lasix  Intractable nausea and vomiting and diarrhea -Consult GI -prn zofran -clear liquids -check Cdiff -stool pathogen panel -start IV protonix -EGD 02/07/15 as discussed above  Acute on chronic renal failure--CKD stage 3 -Secondary to volume depletion -Continue IV fluids -d/c colchicine -Baseline creatinine 1.8-2.2 -Presenting creatinine 3.75  Transaminasemia -likely due to hypotension -RUQ [place] -hep B surface antigen -hep c antibody  Coronary artery disease with history NSTEMI/Elevated troponin -no angina -finish cycling troponins -elevated troponins likely demand ischemia -EKG--atrial fibrillation, LBBB -04/28/2017 echo EF 50-55%, PASP 49  Atrial fibrillation -The patient was a poor candidate for AC due to his portal gastropathy -The decision was made to continue the patient on DAPT at time of d/c in June 2018 -rate controlled -continue DAPT  Chronic respiratory failure with hypoxia -stable on his baseline 2L -wean if possible for sat >92%  Hyperglycemia -Check hemoglobin A1c  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 Adenomatous polyp of colon   . Alcoholism (  [place])    Moonshine  . Arthritis   . Atrial fibrillation ([place])   . Blindness of right eye 1997  . CAD (coronary  artery disease)    Multivessel disease at cardiac catheterization June 2018, DES to LAD June 2018 with significant residual disease including diagonal and obtuse marginal vessels as well as RCA  . Cirrhosis of liver (HCC)    Associated portal gastropathy  . CKD (chronic kidney disease) stage 3, GFR 30-59 ml/min   . Essential hypertension   . Hiatal hernia   . Hypothyroidism   . 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 TIA (transient ischemic attack)    Past Surgical History:  Procedure Laterality Date  . CARDIAC CATHETERIZATION  2003  . COLONOSCOPY  08/2008   normal, repeat exam in 5-7 years  . COLONOSCOPY  2004   rectal adenomatous polyp  . COLONOSCOPY N/A 12/01/2014   [id number] rectum/elongated colon  . CORONARY ATHERECTOMY N/A 05/03/2017   Procedure: Coronary Atherectomy;  Surgeon: Leonie Man, MD;  Location: [facility];  Service: Cardiovascular;  Laterality: N/A;  . CORONARY STENT INTERVENTION N/A 05/03/2017   Procedure: Coronary Stent Intervention;  Surgeon: Leonie Man, MD;  Location: [facility];  Service: Cardiovascular;  Laterality: N/A;  . ESOPHAGEAL DILATION N/A 02/07/2015   Procedure: ESOPHAGEAL DILATION;  Surgeon: Daneil Dolin, MD;  [facility];  Service: Endoscopy;  Laterality: N/A;  . ESOPHAGOGASTRODUODENOSCOPY  02/11/2012   Dr. Gala Romney: portal gastropathy, gastric erosions, esophageal ulcerations likely pill-induced, surveillance in 2 years  . ESOPHAGOGASTRODUODENOSCOPY N/A 12/01/2014   Dr. Volney American esophageal stricture dilated with the scope passage, portal gastropathy, negative H.pylori on gastric biopsies, esophageal biopsies benign  . ESOPHAGOGASTRODUODENOSCOPY N/A 02/07/2015   Procedure: ESOPHAGOGASTRODUODENOSCOPY (EGD);  Surgeon: Daneil Dolin, MD;  [facility];  Service: Endoscopy;  Laterality: N/A;  1115  . EYE SURGERY    . JOINT REPLACEMENT    . LEFT HEART CATH AND CORONARY ANGIOGRAPHY N/A 05/01/2017   Procedure: Left  Heart Cath and Coronary Angiography;  Surgeon: Leonie Man, MD;  Location: [facility];  Service: Cardiovascular;  Laterality: N/A;  . s/p eye implant  1997   artificial eye, right  . TOTAL HIP ARTHROPLASTY  2002  . TOTAL HIP ARTHROPLASTY  2006/2012   revision in 2012  . TOTAL KNEE ARTHROPLASTY  1999/2003   left/right   Social History:  reports that he has never smoked. He has never used smokeless tobacco. He reports that he drinks alcohol. He reports that he does not use drugs.   Family History  Problem Relation Age of Onset  . Ovarian cancer Sister   . Colon cancer Neg Hx      No Known Allergies   Prior to Admission medications   Medication Sig Start Date End Date Taking? Authorizing Provider  calcium carbonate (TUMS - DOSED IN MG ELEMENTAL CALCIUM) 500 MG chewable tablet Chew 1 tablet by mouth 2 (two) times daily.   Yes [provider]  carvedilol (COREG) 6.25 MG tablet Take 1 tablet (6.25 mg total) by mouth 2 (two) times daily with a meal. 05/06/17  Yes Barton Dubois, MD  clopidogrel (PLAVIX) 75 MG tablet Take 1 tablet (75 mg total) by mouth daily. 05/07/17  Yes Barton Dubois, MD  colchicine 0.6 MG tablet Take 0.6 mg by mouth 2 (two) times daily.   Yes [provider]  docusate sodium (COLACE) 100 MG capsule Take 1 capsule (100 mg total)  by mouth 2 (two) times daily. Patient taking differently: Take 100 mg by mouth daily.  05/06/17  Yes Barton Dubois, MD  Ergocalciferol (VITAMIN D2) 400 units TABS Take 1 tablet by mouth 2 (two) times daily.   Yes [provider]  folic acid (FOLVITE) 1 MG tablet Take 1 tablet (1 mg total) by mouth daily. 05/07/17  Yes Barton Dubois, MD  furosemide (LASIX) 40 MG tablet Take 40 mg by mouth 2 (two) times daily.   Yes [provider]  levothyroxine (SYNTHROID, LEVOTHROID) 75 MCG tablet Take 75 mcg by mouth daily before breakfast.   Yes [provider]  Melatonin 3 MG TABS Take 1 tablet by  mouth at bedtime.   Yes [provider]  Multiple Vitamin (MULTIVITAMIN) tablet Take 1 tablet by mouth daily.   Yes [provider]  omeprazole (PRILOSEC) 40 MG capsule Take 1 capsule (40 mg total) by mouth 2 (two) times daily. 03/12/16  Yes Carlis Stable, NP  ondansetron (ZOFRAN) 4 MG tablet Take 4 mg by mouth every 8 (eight) hours as needed for nausea or vomiting.   Yes [provider]  polyethylene glycol (MIRALAX / GLYCOLAX) packet Take 17 g by mouth daily as needed for mild constipation. Patient taking differently: Take 17 g by mouth daily.  05/06/17  Yes Barton Dubois, MD  ranitidine (ZANTAC) 75 MG tablet Take 75 mg by mouth 2 (two) times daily.   Yes [provider]  thiamine 100 MG tablet Take 1 tablet (100 mg total) by mouth daily. 05/07/17  Yes Barton Dubois, MD  traMADol (ULTRAM) 50 MG tablet Take 50 mg by mouth every 6 (six) hours as needed.   Yes [provider]  vitamin C (ASCORBIC ACID) 500 MG tablet Take 500 mg by mouth daily.   Yes [provider]    Review of Systems:  Constitutional:  No weight loss, night sweats, Fevers, chills Head&Eyes: No headache.  No vision loss.  No eye pain or scotoma ENT:  No Difficulty swallowing,Tooth/dental problems,Sore throat,  No ear ache, post nasal drip,  Cardio-vascular:  No Orthopnea, PND, swelling in lower extremities,  dizziness, palpitations  GI:  No  abdominal pain,  loss of appetite, hematochezia, melena, heartburn, indigestion, Resp:  No shortness of breath with exertion or at rest. No cough. No coughing up of blood .No wheezing.No chest wall deformity  Skin:  no rash or lesions.  GU:  no dysuria, change in color of urine, no urgency or frequency. No flank pain.  Musculoskeletal:  No joint pain or swelling. No decreased range of motion. No back pain.  Psych:  No change in mood or affect. No depression or anxiety. Neurologic: No headache, no dysesthesia, no focal  weakness, no vision loss. No syncope  Physical Exam: Vitals:   08/15/17 1345 08/15/17 1352 08/15/17 1400 08/15/17 1415  BP:   93/69   Pulse: 86 84  79  Resp: (!) 21 (!) 22 19 18   Temp:      TempSrc:      SpO2: 100% 100%  97%  Weight:      Height:       General:  A&O x 3, NAD, nontoxic, pleasant/cooperative Head/Eye: No conjunctival hemorrhage, no icterus, [place]/AT, No nystagmus ENT:  No icterus,  No thrush, good dentition, no pharyngeal exudate Neck:  No masses, no lymphadenpathy, no bruits CV:  IRRR, no rub, no gallop, no S3 Lung:  CTAB, good air movement, no wheeze, no rhonchi Abdomen: soft/NT, +BS,  nondistended, no peritoneal signs Ext: No cyanosis, No rashes, No petechiae, No lymphangitis, trace LE edema Neuro: CNII-XII intact, strength 4/5 in bilateral upper and lower extremities, no dysmetria  Labs on Admission:  Basic Metabolic Panel:  Recent Labs Lab 08/15/17 1132  NA 133*  K 3.1*  CL 86*  CO2 33*  GLUCOSE 142*  BUN 67*  CREATININE 3.75*  CALCIUM 10.0   Liver Function Tests:  Recent Labs Lab 08/15/17 1132  AST 132*  ALT 96*  ALKPHOS 138*  BILITOT 1.3*  PROT 8.2*  ALBUMIN 3.9    Recent Labs Lab 08/15/17 1132  LIPASE 45   No results for input(s): AMMONIA in the last 168 hours. CBC:  Recent Labs Lab 08/15/17 1132  WBC 5.7  NEUTROABS 3.2  HGB 13.4  HCT 41.1  MCV 88.6  PLT 170   Coagulation Profile:  Recent Labs Lab 08/15/17 1130  INR 1.08   Cardiac Enzymes:  Recent Labs Lab 08/15/17 1130  TROPONINI 0.04*   BNP: Invalid input(s): POCBNP CBG: No results for input(s): GLUCAP in the last 168 hours. Urine analysis:    Component Value Date/Time   COLORURINE YELLOW 11/12/2013 [place] 11/12/2013 2315   LABSPEC <1.005 (L) 11/12/2013 2315   PHURINE 5.5 11/12/2013 2315   GLUCOSEU NEGATIVE 11/12/2013 2315   HGBUR NEGATIVE 11/12/2013 2315   BILIRUBINUR NEGATIVE 11/12/2013 2315   KETONESUR NEGATIVE 11/12/2013 2315     PROTEINUR NEGATIVE 11/12/2013 2315   UROBILINOGEN 0.2 11/12/2013 2315   NITRITE NEGATIVE 11/12/2013 2315   LEUKOCYTESUR NEGATIVE 11/12/2013 2315   Sepsis Labs: @LABRCNTIP (procalcitonin:4,lacticidven:4) )No results found for this or any previous visit (from the past 240 hour(s)).   Radiological Exams on Admission: No results found.  EKG: Independently reviewed. Afib, nonspecific ST changes    Time spent:60 minutes Code Status:   FULL Family Communication:  No Family at bedside Disposition Plan: expect 2-3 day hospitalization Consults called: GI DVT Prophylaxis:  Heparin     Buzz Axel, DO  [facility] Pager [phone]  If 7PM-7AM, please contact night-coverage [email or web address] Password [email or web address] 08/15/2017, 2:22 PM

## 2017-08-15 NOTE — Assessment & Plan Note (Signed)
Is unable to discriminate if he is having GERD symptoms. He is a poor historian. He is on Prilosec twice daily and before his nausea and vomiting we have not heard of any issues with recurrent GERD symptoms. Recommended continue PPI. Return for follow-up in 6 months for routine liver care.

## 2017-08-15 NOTE — ED Triage Notes (Signed)
Pt sent for GI MD for intractable n/v. Pt c/o weight loss and fatigue.

## 2017-08-16 ENCOUNTER — Inpatient Hospital Stay (HOSPITAL_COMMUNITY): Payer: Medicare Other

## 2017-08-16 DIAGNOSIS — I480 Paroxysmal atrial fibrillation: Secondary | ICD-10-CM

## 2017-08-16 DIAGNOSIS — R112 Nausea with vomiting, unspecified: Secondary | ICD-10-CM

## 2017-08-16 DIAGNOSIS — N179 Acute kidney failure, unspecified: Secondary | ICD-10-CM

## 2017-08-16 DIAGNOSIS — R748 Abnormal levels of other serum enzymes: Secondary | ICD-10-CM

## 2017-08-16 DIAGNOSIS — K7030 Alcoholic cirrhosis of liver without ascites: Secondary | ICD-10-CM

## 2017-08-16 DIAGNOSIS — N183 Chronic kidney disease, stage 3 (moderate): Secondary | ICD-10-CM

## 2017-08-16 DIAGNOSIS — K219 Gastro-esophageal reflux disease without esophagitis: Secondary | ICD-10-CM

## 2017-08-16 DIAGNOSIS — E860 Dehydration: Secondary | ICD-10-CM

## 2017-08-16 LAB — URINALYSIS, COMPLETE (UACMP) WITH MICROSCOPIC
BILIRUBIN URINE: NEGATIVE
Glucose, UA: NEGATIVE mg/dL
Hgb urine dipstick: NEGATIVE
KETONES UR: NEGATIVE mg/dL
Nitrite: NEGATIVE
PH: 5 (ref 5.0–8.0)
Protein, ur: NEGATIVE mg/dL
SPECIFIC GRAVITY, URINE: 1.011 (ref 1.005–1.030)

## 2017-08-16 LAB — BASIC METABOLIC PANEL
ANION GAP: 9 (ref 5–15)
BUN: 58 mg/dL — ABNORMAL HIGH (ref 6–20)
CALCIUM: 8.7 mg/dL — AB (ref 8.9–10.3)
CO2: 30 mmol/L (ref 22–32)
CREATININE: 3.09 mg/dL — AB (ref 0.61–1.24)
Chloride: 98 mmol/L — ABNORMAL LOW (ref 101–111)
GFR, EST AFRICAN AMERICAN: 21 mL/min — AB (ref >60)
GFR, EST NON AFRICAN AMERICAN: 18 mL/min — AB (ref >60)
Glucose, Bld: 98 mg/dL (ref 65–99)
Potassium: 3.5 mmol/L (ref 3.5–5.1)
SODIUM: 137 mmol/L (ref 135–145)

## 2017-08-16 LAB — CBC
HCT: 33.6 % — ABNORMAL LOW (ref 39.0–52.0)
HEMOGLOBIN: 10.7 g/dL — AB (ref 13.0–17.0)
MCH: 28.6 pg (ref 26.0–34.0)
MCHC: 31.8 g/dL (ref 30.0–36.0)
MCV: 89.8 fL (ref 78.0–100.0)
PLATELETS: 137 10*3/uL — AB (ref 150–400)
RBC: 3.74 MIL/uL — AB (ref 4.22–5.81)
RDW: 17.5 % — ABNORMAL HIGH (ref 11.5–15.5)
WBC: 4 10*3/uL (ref 4.0–10.5)

## 2017-08-16 LAB — RAPID URINE DRUG SCREEN, HOSP PERFORMED
AMPHETAMINES: NOT DETECTED
BARBITURATES: NOT DETECTED
BENZODIAZEPINES: NOT DETECTED
COCAINE: NOT DETECTED
Opiates: NOT DETECTED
Tetrahydrocannabinol: NOT DETECTED

## 2017-08-16 LAB — HEPATIC FUNCTION PANEL
ALT: 72 U/L — ABNORMAL HIGH (ref 17–63)
AST: 88 U/L — AB (ref 15–41)
Albumin: 3 g/dL — ABNORMAL LOW (ref 3.5–5.0)
Alkaline Phosphatase: 101 U/L (ref 38–126)
BILIRUBIN DIRECT: 0.4 mg/dL (ref 0.1–0.5)
Indirect Bilirubin: 0.7 mg/dL (ref 0.3–0.9)
TOTAL PROTEIN: 6.1 g/dL — AB (ref 6.5–8.1)
Total Bilirubin: 1.1 mg/dL (ref 0.3–1.2)

## 2017-08-16 LAB — MAGNESIUM: MAGNESIUM: 1.3 mg/dL — AB (ref 1.7–2.4)

## 2017-08-16 LAB — HEPATITIS C ANTIBODY

## 2017-08-16 LAB — HEPATITIS B SURFACE ANTIGEN: HEP B S AG: NEGATIVE

## 2017-08-16 LAB — TROPONIN I: Troponin I: 0.03 ng/mL (ref <0.03)

## 2017-08-16 MED ORDER — MAGNESIUM SULFATE 2 GM/50ML IV SOLN
2.0000 g | Freq: Once | INTRAVENOUS | Status: AC
  Administered 2017-08-16: 2 g via INTRAVENOUS
  Filled 2017-08-16: qty 50

## 2017-08-16 MED ORDER — ONDANSETRON HCL 4 MG/2ML IJ SOLN
4.0000 mg | Freq: Four times a day (QID) | INTRAMUSCULAR | Status: DC
  Administered 2017-08-16 – 2017-08-19 (×11): 4 mg via INTRAVENOUS
  Filled 2017-08-16 (×11): qty 2

## 2017-08-16 MED ORDER — HEPARIN SODIUM (PORCINE) 5000 UNIT/ML IJ SOLN
5000.0000 [IU] | Freq: Three times a day (TID) | INTRAMUSCULAR | Status: DC
  Administered 2017-08-17 – 2017-08-19 (×7): 5000 [IU] via SUBCUTANEOUS
  Filled 2017-08-16 (×7): qty 1

## 2017-08-16 MED ORDER — TECHNETIUM TC 99M SULFUR COLLOID: 2.0000 | Freq: Once | INTRAVENOUS | Status: DC | PRN

## 2017-08-16 MED ORDER — PANTOPRAZOLE SODIUM 40 MG PO TBEC
40.0000 mg | DELAYED_RELEASE_TABLET | Freq: Two times a day (BID) | ORAL | Status: DC
  Administered 2017-08-17 – 2017-08-19 (×4): 40 mg via ORAL
  Filled 2017-08-16 (×4): qty 1

## 2017-08-16 MED ORDER — BOOST / RESOURCE BREEZE PO LIQD
1.0000 | Freq: Four times a day (QID) | ORAL | Status: DC
  Administered 2017-08-16: 1 via ORAL
  Administered 2017-08-17: 22:00:00 via ORAL
  Administered 2017-08-17 – 2017-08-19 (×4): 1 via ORAL

## 2017-08-16 MED ORDER — PROMETHAZINE HCL 25 MG/ML IJ SOLN: 12.5000 mg | Freq: Three times a day (TID) | INTRAMUSCULAR | Status: DC | PRN

## 2017-08-16 NOTE — Progress Notes (Signed)
Initial Nutrition Assessment  DOCUMENTATION CODES:      INTERVENTION:  RD will follow up once diet is advanced   NUTRITION DIAGNOSIS:   Inadequate oral intake related to nausea, vomiting as evidenced by  (chart H&P) -documented wt loss and intractable nausea and vomiting prior to admission.    GOAL:   Patient will meet greater than or equal to 90% of their needs   MONITOR:   Diet advancement, PO intake, Weight trends  REASON FOR ASSESSMENT:   Malnutrition Screening Tool    ASSESSMENT: The patient presents with c/o nausea, vomiting and wt loss. He has a hx of cirrhosis, esophageal varices, CKD-3, CAD, and TIA. He is blind in the right eye. Chart reviewed. Discussed pt with his nurse today. He is out of room for Gastric Emptying study.  Diet hx: home diet is regular. Currently he was tolerating clear liquids but did not want his diet to advance further.  Weight hx: based on hospital records his wt is down 35-40 lbs (16%) in the past 3 months which is severe.   Unable to complete Nutrition-Focused physical exam at this time. The patient is out of room for procedure. The patient is likely malnourished. RD will follow up to assess further.    Recent Labs Lab 08/15/17 1132 08/16/17 0406  NA 133* 137  K 3.1* 3.5  CL 86* 98*  CO2 33* 30  BUN 67* 58*  CREATININE 3.75* 3.09*  CALCIUM 10.0 8.7*  MG  --  1.3*  GLUCOSE 142* 98   Labs: BUN 58, Cr 3.09, Mag 1.3   Meds/vitamin therapy: thiamine, folic acid, vitamin D, MVI, Vitamin C, PPI, Zofran  Diet Order:  Diet NPO time specified  Skin:  Reviewed, no issues  Last BM:  9/26  Height:   Ht Readings from Last 1 Encounters:  08/15/17 5' 9"  (1.753 m)    Weight:   Wt Readings from Last 1 Encounters:  08/15/17 234 lb (106.1 kg)    Ideal Body Weight:  73 kg  BMI:  Body mass index is 34.56 kg/m.  Estimated Nutritional Needs:   Kcal:  0721-8288  Protein:  70-80 gr  Fluid:  1.9-2.2 liters daily  EDUCATION  NEEDS:   No education needs identified at this time  Colman Cater MS,RD,CSG,LDN Office: #[phone] Pager: [phone]

## 2017-08-16 NOTE — Progress Notes (Signed)
Subjective: Feels better than yesterday. Some intermittent nausea persists, no further vomiting. Keeping down fluids well. Still weak. Denies abdominal pain. No bowel movement since admission. Mild and intermittent/rare dysphagia, history of esophageal stricture. No other GI complaints.  Objective: Vital signs in last 24 hours: Temp:  [97.4 F (36.3 C)-98.7 F (37.1 C)] 98 F (36.7 C) (09/28 0400) Pulse Rate:  [61-89] 76 (09/28 0900) Resp:  [10-23] 18 (09/28 0900) BP: (70-120)/(48-84) 88/64 (09/28 0900) SpO2:  [87 %-100 %] 98 % (09/28 0900) FiO2 (%):  [28 %] 28 % (09/27 2000) Weight:  [234 lb (106.1 kg)] 234 lb (106.1 kg) (09/27 1110) Last BM Date: 08/14/17 General:   Alert and oriented, pleasant Head:  Normocephalic and atraumatic. Eyes:  No icterus, sclera clear. Conjuctiva pink.  Heart:  S1, S2 present, no murmurs noted.  Lungs: Clear to auscultation bilaterally, without wheezing, rales, or rhonchi.  Abdomen:  Bowel sounds present, soft, non-tender, non-distended. No HSM or hernias noted. No rebound or guarding. No masses appreciated  Msk:  Symmetrical without gross deformities. Pulses:  Normal bilateral DP pulses noted. Extremities:  Without clubbing or edema. Neurologic:  Alert and  oriented x4;  grossly normal neurologically. Psych:  Alert and cooperative. Normal mood and affect.  Intake/Output from previous day: 09/27 0701 - 09/28 0700 In: 2498.3 [I.V.:1498.3; IV Piggyback:1000] Out: 650 [Urine:650] Intake/Output this shift: No intake/output data recorded.  Lab Results:  Recent Labs  08/15/17 1132 08/16/17 0406  WBC 5.7 4.0  HGB 13.4 10.7*  HCT 41.1 33.6*  PLT 170 137*   BMET  Recent Labs  08/15/17 1132 08/16/17 0406  NA 133* 137  K 3.1* 3.5  CL 86* 98*  CO2 33* 30  GLUCOSE 142* 98  BUN 67* 58*  CREATININE 3.75* 3.09*  CALCIUM 10.0 8.7*   LFT  Recent Labs  08/15/17 1132 08/16/17 0406  PROT 8.2* 6.1*  ALBUMIN 3.9 3.0*  AST 132* 88*   ALT 96* 72*  ALKPHOS 138* 101  BILITOT 1.3* 1.1  BILIDIR  --  0.4  IBILI  --  0.7   PT/INR  Recent Labs  08/15/17 1130  LABPROT 13.9  INR 1.08   Hepatitis Panel  Recent Labs  08/15/17 1547  HEPBSAG Negative  HCVAB <0.1     Studies/Results: US Abdomen Limited Ruq  Result Date: 08/15/2017 CLINICAL DATA:  Abnormal LFTs EXAM: ULTRASOUND ABDOMEN LIMITED RIGHT UPPER QUADRANT COMPARISON:  04/27/2017 FINDINGS: Gallbladder: Gallbladder is well distended with evidence of tumefactive sludge. No wall thickening or pericholecystic fluid is noted. Negative sonographic Murphy's sign is seen. Common bile duct: Diameter: 3.5 mm. Liver: No focal lesion identified. Within normal limits in parenchymal echogenicity. Portal vein is patent on color Doppler imaging with normal direction of blood flow towards the liver. Echogenic lesion is noted between the margin of the right lobe of the liver and the right kidney consistent with the fatty lesion seen on prior CT examination within the right adrenal gland. IMPRESSION: Tumefactive sludge without complicating factors. No other focal abnormality is noted. Electronically Signed   By: Inez Catalina M.D.   On: 08/15/2017 15:23    Assessment: 76 year old male currently at a nursing home for rehabilitation status post NSTEMI and stent placement approximately June 2018. Has atrial fibrillation, chronic kidney disease stage III. 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 Noted portal gastropathy. He presented to our office yesterday which point he was having dry heaving and retching  for the duration of the visit, complained of 2 months of progressive intractable nausea and vomiting and was found to be hypotensive in our office. He was referred to the emergency department and subsequently admitted to ICU for acute on chronic kidney injury, intractable nausea and vomiting, hypotension. He has been treated with when  necessary antibiotics and fluids. He is receiving electrolyte replacement.  Yesterday his labs included INR 1.08, CBC essentially normal, CMP with multiple derangements including hyponatremia at 133, hypokalemia at 3.1, creatinine elevated to 3.75, elevated liver functions with AST/ALT 132/96, alkaline phosphatase 138, bilirubin 1.3. His GFR was calculated to 14. Lipase was normal. C. difficile in GI panel pending. Blood cultures with no gross in less than 24 hours. His TSH was elevated at 6.239. Hepatitis B surface antigen negative, hepatitis C antibody negative.   His troponins were initially elevated 0.04 and improved to 0.03. This is deemed demand ischemia although the patient missed his post stent discharge follow-up visit with cardiology and will likely need a follow-up appointment scheduled for after discharge.  His labs today showing significant improvement including normalized sodium, potassium. Creatinine improved to 3.09. Hemoglobin declined to 10.7, likely rehydration and affect and multifactorial anemia including chronic kidney disease stage III and multiple comorbidities. No signs of bleeding subjectively or objectively. His magnesium is low at 1.3. His hepatic function panel improved with AST/ALT declined to 88/72, normal alkaline phosphatase, bilirubin.  He is clinically much improved today with some transient persistent nausea although no vomiting. Overall his labs are improved. There will likely continue to improve with gentle hydration. He does describe some intermittent/rare dysphagia with a history of esophageal stricture. He may benefit from repeat EGD with possible dilation as an outpatient. No urgent need at this time unless more significant dysphagia reveals itself.   Plan: 1. Schedule Zofran around the clock for persistent nausea. 2. Phenergan as needed. 3. Continue Protonix twice daily. 4. Continue clear liquid diet, advance as tolerated. 5. Will likely need cardiology  appointment as an outpatient. 6. Will need a follow-up office visit with our practice after discharge. 7. Supportive measures.   Thank you for allowing [place] to participate in the care of Justin Parsons Gross, DNP, AGNP-C Adult & Gerontological Nurse Practitioner [facility]     LOS: 1 day    08/16/2017, 10:10 AM

## 2017-08-16 NOTE — NC FL2 (Signed)
[place] LEVEL OF CARE SCREENING TOOL     IDENTIFICATION  Patient Name: Justin Parsons Birthdate: Mar 23, 1941 Sex: male Admission Date (Current Location): 08/15/2017  [facility] and [place] Number:  [employer] and Address:  [place] [facility] [place] [address], [place]      Provider Number: [id number]  Attending Physician Name and Address:  Orson Eva, MD  Relative Name and Phone Number:       Current Level of Care: Hospital Recommended Level of Care: [place] Prior Approval Number:    Date Approved/Denied:   PASRR Number:    Discharge Plan: SNF    Current Diagnoses: Patient Active Problem List   Diagnosis Date Noted  . Acute kidney injury ([place])   . Nausea with vomiting 08/15/2017  . Hypotension 08/15/2017  . Dehydration 08/15/2017  . Intractable vomiting with nausea 08/15/2017  . Transaminasemia 08/15/2017  . AF (paroxysmal atrial fibrillation) ([place]) 08/15/2017  . Alcoholic cirrhosis of liver without ascites ([place]) 08/15/2017  . Abnormal liver enzymes   . Coronary artery disease involving native coronary artery of native heart with angina pectoris ([place])   . Tobacco abuse   . Acute renal failure superimposed on stage 3 chronic kidney disease ([place])   . Alcohol use disorder ([place])   . Non-ST elevation (NSTEMI) myocardial infarction ([place]) 04/29/2017  . Delirium tremens ([place])   . Shock circulatory ([place])   . Confusion   . Acute encephalopathy 04/27/2017  . Schatzki's ring   . Esophageal varices ([place])   . History of esophageal stricture 01/20/2015  . Esophageal stricture   . History of colonic polyps   . Dysphagia, pharyngoesophageal phase 11/04/2014  . Suicidal ideation 11/17/2013  . Acute renal failure ([place]) 11/17/2013  . Acute diastolic CHF (congestive heart failure) ([place]) 11/13/2013  . Acute respiratory failure ([place]) 11/13/2013  . Community acquired pneumonia 11/12/2013  . A-fib ([place]) 11/12/2013  .  CKD (chronic kidney disease), stage III 11/12/2013  . Unspecified hypothyroidism 11/12/2013  . Dyspnea 11/12/2013  . Thrombocytopenia ([place]) 06/05/2012  . ARF (acute renal failure) ([place]) 06/04/2012  . Obesity 06/04/2012  . Cirrhosis ([place]) 01/29/2012  . Adenomatous polyp 01/29/2012  . Renal insufficiency 05/01/2011  . Anemia 05/01/2011  . Rectal bleed 05/01/2011  . GERD (gastroesophageal reflux disease) 05/01/2011    Orientation RESPIRATION BLADDER Height & Weight     Self, Time, Situation, Place  Normal Incontinent Weight: 106.1 kg (234 lb) Height:  5' 9"  (175.3 cm)  BEHAVIORAL SYMPTOMS/MOOD NEUROLOGICAL BOWEL NUTRITION STATUS      Continent Diet (Please see DC Summary)  AMBULATORY STATUS COMMUNICATION OF NEEDS Skin   Limited Assist Verbally Normal                       Personal Care Assistance Level of Assistance  Bathing, Feeding, Dressing Bathing Assistance: Limited assistance Feeding assistance: Independent Dressing Assistance: Limited assistance     Functional Limitations Info  Sight Sight Info: Impaired        SPECIAL CARE FACTORS FREQUENCY                       Contractures      Additional Factors Info  Code Status, Allergies Code Status Info: Full Allergies Info: NKA           Current Medications (08/16/2017):  This is the current hospital active medication list Current Facility-Administered Medications  Medication Dose Route Frequency Provider Last  Rate Last Dose  . 0.9 % NaCl with KCl 40 mEq / L  infusion   Intravenous Continuous Tat, David, MD 100 mL/hr at 08/16/17 0600    . acetaminophen (TYLENOL) tablet 650 mg  650 mg Oral Q6H PRN Tat, David, MD       Or  . acetaminophen (TYLENOL) suppository 650 mg  650 mg Rectal Q6H PRN Tat, Shanon Brow, MD      . cholecalciferol (VITAMIN D) tablet 400 Units  400 Units Oral BID Orson Eva, MD   400 Units at 08/16/17 0900  . clopidogrel (PLAVIX) tablet 75 mg  75 mg Oral Daily Tat, David, MD   75 mg at  08/16/17 0900  . famotidine (PEPCID) tablet 20 mg  20 mg Oral Daily Tat, David, MD   20 mg at 08/15/17 1630  . feeding supplement (BOOST / RESOURCE BREEZE) liquid 1 Container  1 Container Oral TID BM Orson Eva, MD   1 Container at 08/16/17 1000  . folic acid (FOLVITE) tablet 1 mg  1 mg Oral Daily Tat, David, MD   1 mg at 08/16/17 0900  . heparin injection 5,000 Units  5,000 Units Subcutaneous Franco Collet, MD   5,000 Units at 08/16/17 0535  . levothyroxine (SYNTHROID, LEVOTHROID) tablet 75 mcg  75 mcg Oral QAC breakfast Tat, Shanon Brow, MD   75 mcg at 08/16/17 0834  . multivitamin with minerals tablet 1 tablet  1 tablet Oral Daily Tat, David, MD   1 tablet at 08/16/17 0900  . ondansetron (ZOFRAN) tablet 4 mg  4 mg Oral Q6H PRN Tat, David, MD       Or  . ondansetron (ZOFRAN) injection 4 mg  4 mg Intravenous Q6H PRN Orson Eva, MD   4 mg at 08/15/17 1958  . ondansetron (ZOFRAN) injection 4 mg  4 mg Intravenous Q6H Carlis Stable, NP   Stopped at 08/16/17 1200  . pantoprazole (PROTONIX) injection 40 mg  40 mg Intravenous Therisa Doyne, MD   40 mg at 08/16/17 0900  . promethazine (PHENERGAN) injection 12.5 mg  12.5 mg Intravenous Q8H PRN Walden Field A, NP      . technetium sulfur colloid (NYCOMED-Yellow Medicine) injection solution 2 millicurie  2 millicurie Oral Once PRN Register, Anola Gurney., MD      . thiamine (VITAMIN B-1) tablet 100 mg  100 mg Oral Daily Tat, David, MD   100 mg at 08/16/17 0900  . traMADol (ULTRAM) tablet 50 mg  50 mg Oral Q6H PRN Orson Eva, MD   50 mg at 08/16/17 0856  . vitamin C (ASCORBIC ACID) tablet 500 mg  500 mg Oral Daily Tat, David, MD   500 mg at 08/16/17 0900     Discharge Medications: Please see discharge summary for a list of discharge medications.  Relevant Imaging Results:  Relevant Lab Results:   Additional Information SSN: [id number]  Benard Halsted, LCSWA

## 2017-08-16 NOTE — Care Management Important Message (Signed)
Important Message  Patient Details  Name: Justin Parsons MRN: [id number] Date of Birth: Nov 24, 1940   Medicare Important Message Given:  Yes    Katya Rolston, Chauncey Reading, RN 08/16/2017, 12:49 PM

## 2017-08-16 NOTE — Evaluation (Signed)
Physical Therapy Evaluation Patient Details Name: Justin Parsons MRN: [id number] DOB: Apr 27, 1941 Today's Date: 08/16/2017   History of Present Illness  76 yo male with onset of nausea and vomiting with dramatic weight loss was admitted and ck for enteric infection ongoing.  Was hypotensive, had acute renal failure and recent NSTEMI this summer.  PMHx:  CKD3, cirrhosis, a-fib, TIA, R eye blind, chronic respiratory failure on home O2, CAD  Clinical Impression  Pt is up to side of bed with max assist and is very limited in effort and ability to assist his balance and attempt to stand.  Declines to try standing and very reluctant to scoot up side of bed.  His plan is to see acutely to strengthen as tolerated and try to reduce the burden of time needed in SNF before returning home with his family.    Follow Up Recommendations SNF    Equipment Recommendations  None recommended by PT    Recommendations for Other Services       Precautions / Restrictions Precautions Precautions: Fall (telemetry) Restrictions Weight Bearing Restrictions: No      Mobility  Bed Mobility Overal bed mobility: Needs Assistance Bed Mobility: Supine to Sit;Sit to Supine     Supine to sit: Max assist Sit to supine: Mod assist   General bed mobility comments: Pt did not help to pull up with his trunk, was not able to assist with most mobility including side of bed scooting up the bed  Transfers Overall transfer level: Needs assistance Equipment used: 1 person hand held assist Transfers: Sit to/from Stand Sit to Stand: Total assist;Max assist         General transfer comment: pt extends very limited effort to attempt to stand, declined to try with walker  Ambulation/Gait             General Gait Details: unable  Stairs            Wheelchair Mobility    Modified Rankin (Stroke Patients Only)       Balance Overall balance assessment: History of Falls;Needs  assistance Sitting-balance support: Feet supported;Bilateral upper extremity supported Sitting balance-Leahy Scale: Poor     Standing balance support: Bilateral upper extremity supported;During functional activity Standing balance-Leahy Scale: Zero                               Pertinent Vitals/Pain Pain Assessment: Faces Faces Pain Scale: Hurts whole lot Pain Location: general symptoms Pain Descriptors / Indicators: Aching;Sore;Tender Pain Intervention(s): Monitored during session;Premedicated before session;Repositioned    Home Living Family/patient expects to be discharged to:: Skilled nursing facility                      Prior Function Level of Independence: Independent with assistive device(s)         Comments: previous cane walker due to pain in joints     Hand Dominance        Extremity/Trunk Assessment   Upper Extremity Assessment Upper Extremity Assessment: Generalized weakness    Lower Extremity Assessment Lower Extremity Assessment: Generalized weakness    Cervical / Trunk Assessment Cervical / Trunk Assessment: Kyphotic  Communication   Communication: No difficulties  Cognition Arousal/Alertness: Awake/alert Behavior During Therapy: Agitated Overall Cognitive Status: Difficult to assess  General Comments      Exercises     Assessment/Plan    PT Assessment Patient needs continued PT services  PT Problem List Decreased strength;Decreased range of motion;Decreased activity tolerance;Decreased balance;Decreased mobility;Decreased coordination;Decreased knowledge of use of DME;Decreased safety awareness;Cardiopulmonary status limiting activity;Obesity;Decreased skin integrity;Pain       PT Treatment Interventions DME instruction;Gait training;Functional mobility training;Therapeutic activities;Therapeutic exercise;Balance training;Neuromuscular  re-education;Patient/family education    PT Goals (Current goals can be found in the Care Plan section)  Acute Rehab PT Goals Patient Stated Goal: to get stronger PT Goal Formulation: With patient Time For Goal Achievement: 08/30/17 Potential to Achieve Goals: Good    Frequency Min 2X/week   Barriers to discharge Inaccessible home environment;Decreased caregiver support needs 2 person assist to move    Co-evaluation               AM-PAC PT "6 Clicks" Daily Activity  Outcome Measure Difficulty turning over in bed (including adjusting bedclothes, sheets and blankets)?: Unable Difficulty moving from lying on back to sitting on the side of the bed? : Unable Difficulty sitting down on and standing up from a chair with arms (e.g., wheelchair, bedside commode, etc,.)?: Unable Help needed moving to and from a bed to chair (including a wheelchair)?: Total Help needed walking in hospital room?: Total Help needed climbing 3-5 steps with a railing? : Total 6 Click Score: 6    End of Session Equipment Utilized During Treatment: Oxygen;Gait belt Activity Tolerance: Patient limited by fatigue;Patient limited by lethargy;Treatment limited secondary to medical complications (Comment) (O2 sats dropped to 80% while scooting up in bed) Patient left: in bed;with call bell/phone within reach;with bed alarm set;with nursing/sitter in room Nurse Communication: Mobility status PT Visit Diagnosis: Unsteadiness on feet (R26.81);Muscle weakness (generalized) (M62.81);Difficulty in walking, not elsewhere classified (R26.2)    Time: 6295-2841 PT Time Calculation (min) (ACUTE ONLY): 28 min   Charges:   PT Evaluation $PT Eval Moderate Complexity: 1 Mod PT Treatments $Therapeutic Activity: 8-22 mins   PT G Codes:   PT G-Codes **NOT FOR INPATIENT CLASS** Functional Assessment Tool Used: AM-PAC 6 Clicks Basic Mobility    Ramond Dial 08/16/2017, 6:05 PM   6:07 PM, 08/16/17 Mee Hives, PT,  MS Physical Therapist - [place] [phone] (Office)

## 2017-08-16 NOTE — Progress Notes (Signed)
Despite a few attempts at trying to void in the urinal, this patient has been unable to successfully pass any urine.  Patient was bladder scanned and it revealed 525 cc of urine in the bladder. MD paged to make aware and order was given to place foley catheter.

## 2017-08-16 NOTE — Progress Notes (Signed)
PROGRESS NOTE  Justin Parsons OZH:[id number] DOB: 08-04-1941 DOA: 08/15/2017 PCP: Monico Blitz, MD  Brief History:   76 y.o. male with medical history of coronary artery disease with NSTEMI June 2018, DES to ostial LAD 05/03/17, atrial fibrillation, CKD stage III, esophageal stricture, alcoholic cirrhosis, portal gastropathy, hypothyroidism presenting from the GI office secondary to hypotension. The patient has been complaining of two-month history of nausea and vomiting. The patient has had difficulty with liquids as well as solids. He has intermittent dysphagia. He states that he vomits shortly after drinking or eating anything. He has lost 50 pounds over the past 2-3 months. He complains of increasing generalized weakness to the point where he has had to use a walker and wheelchair. He denies any fevers, chills, chest pain, worsening shortness of breath, dysuria, hematuria, hematemesis, hematochezia, melena, abdominal pain. However, he states that his stomach does feel "funny and grinding".  He denies any recent travels or eating any exotic foods. The patient had an EGD performed on 02/07/2015 which revealed a Schatzki's ring with a proximal peptic stricture status post dilatation and portal gastropathy. The patient was lost to follow-up with regard to his gastroenterologist until 08/15/2017. In the office, the patient was noted to have generalized weakness with hypotension. As result, the patient was sent to the emergency department for further evaluation.  Assessment/Plan: Hypotension -Likely secondary to volume depletion and intractable vomiting -Continue IV fluids -Check lactic acid--1.0 -Blood cultures 2 sets--pending -UA and urine culture--pending -hold coreg and lasix  Intractable nausea and vomiting and diarrhea -Consult GI -prn zofran -clear liquids--tolerating presently -check Cdiff--no BM since admission to collect -stool pathogen panel--no BM since admission to  collect -continue IV protonix -EGD 02/07/15 as discussed above  Acute on chronic renal failure--CKD stage 3 -Secondary to volume depletion -Continue IV fluids -d/c colchicine -Baseline creatinine 1.8-2.2 -Presenting creatinine 3.75 -am BMP  Transaminasemia -likely due to hypotension -RUQ US--normal liver parenchyma, GB sludge without thickening -hep B surface antigen--negative -hep c antibody--negative -am LFTs  Coronary artery disease with history NSTEMI/Elevated troponin -no angina -finish cycling troponins--flat, 0.04>>0.03>>0.03 -elevated troponins likely demand ischemia -EKG--atrial fibrillation, LBBB -04/28/2017 echo EF 50-55%, PASP 49  Atrial fibrillation -The patient was a poor candidate for AC due to his portal gastropathy -The decision was made to continue the patient on DAPT at time of d/c in June 2018 -rate controlled -continue DAPT  Chronic respiratory failure with hypoxia -stable on his baseline 2L -wean if possible for sat >92%  Hyperglycemia -Check hemoglobin A1c--5.1  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  Greater than 50% spent face to face counseling and coordinating care.   Consultants:  GI  Code Status:  FULL  DVT Prophylaxis:  [place] Heparin   Procedures: As Listed in Progress Note Above  Antibiotics: None    Subjective: Patient is here follow-up. He is tolerating clear liquids. No episodes of nausea or vomiting overnight. Denies any fevers, chills, chest pain, shortness breath, abdominal pain, dysuria, hematuria, headache or neck pain.  Objective: Vitals:   08/16/17 0300 08/16/17 0400 08/16/17 0500 08/16/17 0600  BP: 97/69 99/78 96/66  95/70  Pulse: 74 79 78 79  Resp: 15 17 16 15   Temp:  98 F (36.7  C)    TempSrc:  Oral    SpO2: 99% 100% 99% 99%  Weight:      Height:        Intake/Output Summary (Last 24 hours) at 08/16/17 0846 Last data filed at 08/16/17 0600  Gross per 24 hour  Intake          2498.33 ml  Output              650 ml  Net          1848.33 ml   Weight change:  Exam:   General:  Pt is alert, follows commands appropriately, not in acute distress  HEENT: No icterus, No thrush, No neck mass, [place]/AT  Cardiovascular: IRRR, S1/S2, no rubs, no gallops  Respiratory: CTA bilaterally, no wheezing, no crackles, no rhonchi  Abdomen: Soft/+BS, non tender, non distended, no guarding  Extremities: No edema, No lymphangitis, No petechiae, No rashes, no synovitis   Data Reviewed: I have personally reviewed following labs and imaging studies Basic Metabolic Panel:  Recent Labs Lab 08/15/17 1132 08/16/17 0406  NA 133* 137  K 3.1* 3.5  CL 86* 98*  CO2 33* 30  GLUCOSE 142* 98  BUN 67* 58*  CREATININE 3.75* 3.09*  CALCIUM 10.0 8.7*  MG  --  1.3*   Liver Function Tests:  Recent Labs Lab 08/15/17 1132 08/16/17 0406  AST 132* 88*  ALT 96* 72*  ALKPHOS 138* 101  BILITOT 1.3* 1.1  PROT 8.2* 6.1*  ALBUMIN 3.9 3.0*    Recent Labs Lab 08/15/17 1132  LIPASE 45   No results for input(s): AMMONIA in the last 168 hours. Coagulation Profile:  Recent Labs Lab 08/15/17 1130  INR 1.08   CBC:  Recent Labs Lab 08/15/17 1132 08/16/17 0406  WBC 5.7 4.0  NEUTROABS 3.2  --   HGB 13.4 10.7*  HCT 41.1 33.6*  MCV 88.6 89.8  PLT 170 137*   Cardiac Enzymes:  Recent Labs Lab 08/15/17 1130 08/15/17 1632 08/15/17 2307  TROPONINI 0.04* 0.03* 0.03*   BNP: Invalid input(s): POCBNP CBG: No results for input(s): GLUCAP in the last 168 hours. HbA1C:  Recent Labs  08/15/17 1546  HGBA1C 5.1   Urine analysis:    Component Value Date/Time   COLORURINE YELLOW 11/12/2013 [place] 11/12/2013 2315   LABSPEC <1.005 (L) 11/12/2013  2315   PHURINE 5.5 11/12/2013 2315   GLUCOSEU NEGATIVE 11/12/2013 2315   HGBUR NEGATIVE 11/12/2013 2315   BILIRUBINUR NEGATIVE 11/12/2013 2315   KETONESUR NEGATIVE 11/12/2013 2315   PROTEINUR NEGATIVE 11/12/2013 2315   UROBILINOGEN 0.2 11/12/2013 2315   NITRITE NEGATIVE 11/12/2013 2315   LEUKOCYTESUR NEGATIVE 11/12/2013 2315   Sepsis Labs: @LABRCNTIP (procalcitonin:4,lacticidven:4) ) Recent Results (from the past 240 hour(s))  Culture, blood (Routine X 2) w Reflex to ID Panel     Status: None (Preliminary result)   Collection Time: 08/15/17  3:41 PM  Result Value Ref Range Status   Specimen Description RIGHT ANTECUBITAL  Final   Special Requests   Final    BOTTLES DRAWN AEROBIC AND ANAEROBIC Blood Culture adequate volume   Culture NO GROWTH < 24 HOURS  Final   Report Status PENDING  Incomplete  MRSA PCR Screening     Status: None   Collection Time: 08/15/17  3:53 PM  Result Value Ref Range Status   MRSA by PCR NEGATIVE NEGATIVE Final    Comment:        The GeneXpert MRSA Assay (FDA approved for NASAL specimens only), is one  component of a comprehensive MRSA colonization surveillance program. It is not intended to diagnose MRSA infection nor to guide or monitor treatment for MRSA infections.   Culture, blood (Routine X 2) w Reflex to ID Panel     Status: None (Preliminary result)   Collection Time: 08/15/17  4:26 PM  Result Value Ref Range Status   Specimen Description BLOOD RIGHT HAND  Final   Special Requests   Final    BOTTLES DRAWN AEROBIC AND ANAEROBIC Blood Culture adequate volume   Culture NO GROWTH < 24 HOURS  Final   Report Status PENDING  Incomplete     Scheduled Meds: . cholecalciferol  400 Units Oral BID  . clopidogrel  75 mg Oral Daily  . famotidine  20 mg Oral Daily  . feeding supplement  1 Container Oral TID BM  . folic acid  1 mg Oral Daily  . heparin  5,000 Units Subcutaneous Q8H  . [COMPLETED] Influenza vac split quadrivalent PF  0.5 mL  Intramuscular Tomorrow-1000  . levothyroxine  75 mcg Oral QAC breakfast  . multivitamin with minerals  1 tablet Oral Daily  . pantoprazole (PROTONIX) IV  40 mg Intravenous Q12H  . [COMPLETED] pneumococcal 23 valent vaccine  0.5 mL Intramuscular Tomorrow-1000  . thiamine  100 mg Oral Daily  . vitamin C  500 mg Oral Daily   Continuous Infusions: . 0.9 % NaCl with KCl 40 mEq / L 100 mL/hr at 08/16/17 0600  . magnesium sulfate 1 - 4 g bolus IVPB Stopped (08/16/17 0934)    Procedures/Studies: US Abdomen Limited Ruq  Result Date: 08/15/2017 CLINICAL DATA:  Abnormal LFTs EXAM: ULTRASOUND ABDOMEN LIMITED RIGHT UPPER QUADRANT COMPARISON:  04/27/2017 FINDINGS: Gallbladder: Gallbladder is well distended with evidence of tumefactive sludge. No wall thickening or pericholecystic fluid is noted. Negative sonographic Murphy's sign is seen. Common bile duct: Diameter: 3.5 mm. Liver: No focal lesion identified. Within normal limits in parenchymal echogenicity. Portal vein is patent on color Doppler imaging with normal direction of blood flow towards the liver. Echogenic lesion is noted between the margin of the right lobe of the liver and the right kidney consistent with the fatty lesion seen on prior CT examination within the right adrenal gland. IMPRESSION: Tumefactive sludge without complicating factors. No other focal abnormality is noted. Electronically Signed   By: Inez Catalina M.D.   On: 08/15/2017 15:23    Saiya Crist, DO  [facility] Pager [phone]  If 7PM-7AM, please contact night-coverage [email or web address] Password [email or web address] 08/16/2017, 8:46 AM   LOS: 1 day

## 2017-08-16 NOTE — Progress Notes (Signed)
cc'ed to pcp °

## 2017-08-17 ENCOUNTER — Encounter (HOSPITAL_COMMUNITY)
Admission: EM | Disposition: A | Discharge status: Skilled Nursing Facility | Payer: Self-pay | Source: Home / Self Care | Attending: Internal Medicine

## 2017-08-17 ENCOUNTER — Telehealth: Payer: Self-pay | Admitting: Gastroenterology

## 2017-08-17 ENCOUNTER — Encounter (HOSPITAL_COMMUNITY): Payer: Self-pay | Admitting: Gastroenterology

## 2017-08-17 DIAGNOSIS — R1314 Dysphagia, pharyngoesophageal phase: Secondary | ICD-10-CM

## 2017-08-17 DIAGNOSIS — K3184 Gastroparesis: Secondary | ICD-10-CM

## 2017-08-17 DIAGNOSIS — I959 Hypotension, unspecified: Secondary | ICD-10-CM

## 2017-08-17 DIAGNOSIS — K2970 Gastritis, unspecified, without bleeding: Secondary | ICD-10-CM

## 2017-08-17 DIAGNOSIS — K222 Esophageal obstruction: Secondary | ICD-10-CM

## 2017-08-17 DIAGNOSIS — K311 Adult hypertrophic pyloric stenosis: Secondary | ICD-10-CM

## 2017-08-17 HISTORY — PX: ESOPHAGOGASTRODUODENOSCOPY: SHX5428

## 2017-08-17 HISTORY — PX: ESOPHAGEAL DILATION: SHX303

## 2017-08-17 LAB — CBC
HCT: 33.1 % — ABNORMAL LOW (ref 39.0–52.0)
Hemoglobin: 10.5 g/dL — ABNORMAL LOW (ref 13.0–17.0)
MCH: 28.8 pg (ref 26.0–34.0)
MCHC: 31.7 g/dL (ref 30.0–36.0)
MCV: 90.7 fL (ref 78.0–100.0)
PLATELETS: 138 10*3/uL — AB (ref 150–400)
RBC: 3.65 MIL/uL — AB (ref 4.22–5.81)
RDW: 17.8 % — AB (ref 11.5–15.5)
WBC: 4.6 10*3/uL (ref 4.0–10.5)

## 2017-08-17 LAB — COMPREHENSIVE METABOLIC PANEL
ALT: 65 U/L — AB (ref 17–63)
ANION GAP: 9 (ref 5–15)
AST: 67 U/L — ABNORMAL HIGH (ref 15–41)
Albumin: 2.9 g/dL — ABNORMAL LOW (ref 3.5–5.0)
Alkaline Phosphatase: 100 U/L (ref 38–126)
BUN: 50 mg/dL — ABNORMAL HIGH (ref 6–20)
CHLORIDE: 100 mmol/L — AB (ref 101–111)
CO2: 30 mmol/L (ref 22–32)
CREATININE: 2.55 mg/dL — AB (ref 0.61–1.24)
Calcium: 9.1 mg/dL (ref 8.9–10.3)
GFR, EST AFRICAN AMERICAN: 26 mL/min — AB (ref >60)
GFR, EST NON AFRICAN AMERICAN: 23 mL/min — AB (ref >60)
Glucose, Bld: 113 mg/dL — ABNORMAL HIGH (ref 65–99)
Potassium: 3.4 mmol/L — ABNORMAL LOW (ref 3.5–5.1)
SODIUM: 139 mmol/L (ref 135–145)
Total Bilirubin: 0.8 mg/dL (ref 0.3–1.2)
Total Protein: 6.1 g/dL — ABNORMAL LOW (ref 6.5–8.1)

## 2017-08-17 LAB — URINE CULTURE: Culture: <10000 — AB

## 2017-08-17 LAB — MAGNESIUM: Magnesium: 1.6 mg/dL — ABNORMAL LOW (ref 1.7–2.4)

## 2017-08-17 SURGERY — EGD (ESOPHAGOGASTRODUODENOSCOPY)
Anesthesia: Moderate Sedation

## 2017-08-17 MED ORDER — MIDAZOLAM HCL 5 MG/5ML IJ SOLN
INTRAMUSCULAR | Status: DC | PRN
  Administered 2017-08-17: 2 mg via INTRAVENOUS
  Administered 2017-08-17 (×2): 1 mg via INTRAVENOUS

## 2017-08-17 MED ORDER — STERILE WATER FOR IRRIGATION IR SOLN
Status: DC | PRN
  Administered 2017-08-17: 2.5 mL

## 2017-08-17 MED ORDER — MAGNESIUM SULFATE 2 GM/50ML IV SOLN
2.0000 g | Freq: Once | INTRAVENOUS | Status: AC
  Administered 2017-08-17: 2 g via INTRAVENOUS
  Filled 2017-08-17: qty 50

## 2017-08-17 MED ORDER — MIDAZOLAM HCL 5 MG/5ML IJ SOLN
INTRAMUSCULAR | Status: DC | PRN
  Administered 2017-08-17: 1 mg via INTRAVENOUS

## 2017-08-17 MED ORDER — ONDANSETRON HCL 4 MG/2ML IJ SOLN
INTRAMUSCULAR | Status: DC | PRN
  Administered 2017-08-17: 4 mg via INTRAVENOUS

## 2017-08-17 MED ORDER — NALOXONE HCL 0.4 MG/ML IJ SOLN
INTRAMUSCULAR | Status: DC
Start: 2017-08-17 — End: 2017-08-17
  Filled 2017-08-17: qty 1

## 2017-08-17 MED ORDER — FENTANYL CITRATE (PF) 100 MCG/2ML IJ SOLN
INTRAMUSCULAR | Status: DC | PRN
Start: 2017-08-17 — End: 2017-08-17
  Administered 2017-08-17 (×2): 25 ug via INTRAVENOUS

## 2017-08-17 MED ORDER — POTASSIUM CHLORIDE 10 MEQ/100ML IV SOLN
10.0000 meq | INTRAVENOUS | Status: AC
  Administered 2017-08-17 (×2): 10 meq via INTRAVENOUS
  Filled 2017-08-17 (×2): qty 100

## 2017-08-17 MED ORDER — LIDOCAINE VISCOUS 2 % MT SOLN
OROMUCOSAL | Status: DC | PRN
  Administered 2017-08-17: 1 via OROMUCOSAL

## 2017-08-17 MED ORDER — FLUMAZENIL 0.5 MG/5ML IV SOLN
INTRAVENOUS | Status: AC
  Filled 2017-08-17: qty 5

## 2017-08-17 MED ORDER — MIDAZOLAM HCL 5 MG/5ML IJ SOLN
INTRAMUSCULAR | Status: AC
  Filled 2017-08-17: qty 10

## 2017-08-17 MED ORDER — LIDOCAINE VISCOUS 2 % MT SOLN
OROMUCOSAL | Status: AC
  Filled 2017-08-17: qty 15

## 2017-08-17 MED ORDER — ATROPINE SULFATE 1 MG/ML IJ SOLN
INTRAMUSCULAR | Status: DC
Start: 2017-08-17 — End: 2017-08-17
  Filled 2017-08-17: qty 1

## 2017-08-17 MED ORDER — MINERAL OIL PO OIL
TOPICAL_OIL | ORAL | Status: AC
  Filled 2017-08-17: qty 30

## 2017-08-17 MED ORDER — EPINEPHRINE PF 1 MG/10ML IJ SOSY
PREFILLED_SYRINGE | INTRAMUSCULAR | Status: AC
  Filled 2017-08-17: qty 10

## 2017-08-17 MED ORDER — FENTANYL CITRATE (PF) 100 MCG/2ML IJ SOLN
INTRAMUSCULAR | Status: AC
  Filled 2017-08-17: qty 2

## 2017-08-17 MED ORDER — ONDANSETRON HCL 4 MG/2ML IJ SOLN
INTRAMUSCULAR | Status: AC
  Filled 2017-08-17: qty 2

## 2017-08-17 NOTE — Telephone Encounter (Signed)
REFER PT TO RHEUMATOLOGY, DX: SALIVARY GLAND DYSFUNCTION.

## 2017-08-17 NOTE — Progress Notes (Signed)
Patient alert and oriented. Resting in bed. Visitor at the bedside. IV patent. No complaints of any distress. Vital signs are stable. Report called to Lilia Pro, Therapist, sports.

## 2017-08-17 NOTE — Interval H&P Note (Signed)
History and Physical Interval Note:  08/17/2017 12:12 PM  Justin Parsons  has presented today for surgery, with the diagnosis of DYSPHAGIA  The various methods of treatment have been discussed with the patient and family. After consideration of risks, benefits and other options for treatment, the patient has consented to  Procedure(s): ESOPHAGOGASTRODUODENOSCOPY (EGD) (N/A) ESOPHAGEAL DILATION (N/A) as a surgical intervention .  The patient's history has been reviewed, patient examined, no change in status, stable for surgery.  I have reviewed the patient's chart and labs.  Questions were answered to the patient's satisfaction.     [employer]

## 2017-08-17 NOTE — Progress Notes (Addendum)
PROGRESS NOTE  Justin Parsons HUT:[id number] DOB: 1941/07/05 DOA: 08/15/2017 PCP: Monico Blitz, MD  Brief History:  76 y.o.malewith medical history of coronary artery disease with NSTEMI June 2018, DES to ostial LAD 05/03/17, atrial fibrillation, CKD stage III, esophageal stricture, alcoholic cirrhosis, portal gastropathy, hypothyroidism presenting from the GI office secondary to hypotension. The patient has been complaining of two-month history of nausea and vomiting. The patient has had difficulty with liquids as well as solids. He has intermittent dysphagia. He states that he vomits shortly after drinking or eating anything. He has lost 50 pounds over the past 2-3 months. He complains of increasing generalized weakness to the point where he has had to use a walker and wheelchair. He denies any fevers, chills, chest pain, worsening shortness of breath, dysuria, hematuria, hematemesis, hematochezia, melena, abdominal pain. However, he states that his stomach does feel "funny and grinding". He denies any recent travels or eating any exotic foods. The patient had an EGD performed on 02/07/2015 which revealed a Schatzki's ring with a proximal peptic stricture status post dilatation and portal gastropathy. The patient was lost to follow-up with regard to his gastroenterologist until 08/15/2017. In the office, the patient was noted to have generalized weakness with hypotension. As result, the patient was sent to the emergency department for further evaluation.  Assessment/Plan: Hypotension -Likely secondary to volume depletion and intractable vomiting -Continue IV fluids -Check lactic acid--1.0 -Blood cultures 2 sets--pending -UA and urine culture<10K colony growth -hold coreg and lasix  Intractable nausea and vomiting and diarrhea -Consulted GI-->endoscopy 08/17/17 -NM gastic emptying-->delayed gastric emptying -CT neck--patent airway. 6 mm LUL lung nodule, atrophic salivary  glands, negative pharynx and larynx -prn zofran -clear liquids--tolerating presently -check Cdiff--no BM since admission to collect -stool pathogen panel--no BM since admission to collect -continue IV protonix -EGD 02/07/15 as discussed above  Acute on chronic renal failure--CKD stage 3 -Secondary to volume depletion -Continue IV fluids-->improving -d/c colchicine -Baseline creatinine 1.8-2.2 -Presenting creatinine 3.75 -am BMP  Transaminasemia -likely due to hypotension -RUQ US--normal liver parenchyma, GB sludge without thickening -hep B surface antigen--negative -hep c antibody--negative -am LFTs--improving  Delayed gastric emptying -confirmed by NM gastric emptying study  Coronary artery disease with history NSTEMI/Elevated troponin -no angina -finish cycling troponins--flat, 0.04>>0.03>>0.03 -elevated troponins likely demand ischemia -EKG--atrial fibrillation, LBBB -04/28/2017 echo EF 50-55%, PASP 49  Chronic Atrial fibrillation -The patient was a poor candidate for ACdue to his portal gastropathy -The decision was made to continue the patient on DAPT at time of d/c in June 2018 -rate controlled -continue DAPT  Chronic respiratory failure with hypoxia -stable on his baseline 2L -wean if possible for sat >92%  Hyperglycemia -Check hemoglobin A1c--5.1  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 He is able to tolerate clear liquids without any vomiting. Denies any fevers, chills,  chest pain, shortness breath, nausea, vomiting, abdominal pain. No dysuria or hematuria. No headache or neck pain.   Objective: Vitals:   08/17/17 0726 08/17/17 0800 08/17/17 0900 08/17/17 1129  BP:  112/79 114/81   Pulse: 81 78 86 88  Resp: (!) 21 16 (!) 22 17  Temp: (!) 97.1 F (36.2 C)   (!) 97.4 F (36.3 C)  TempSrc: Oral   Oral  SpO2: 100% 99% 99% 99%  Weight:      Height:        Intake/Output Summary (Last 24 hours) at 08/17/17 1148 Last data filed at 08/17/17 0600  Gross per 24 hour  Intake             1290 ml  Output             1000 ml  Net              290 ml   Weight change: 5.058 kg (11 lb 2.4 oz) Exam:   General:  Pt is alert, follows commands appropriately, not in acute distress  HEENT: No icterus, No thrush, No neck mass, [place]/AT  Cardiovascular: IRRR, S1/S2, no rubs, no gallops  Respiratory: bibasilar crackles, no wheeze  Abdomen: Soft/+BS, non tender, non distended, no guarding  Extremities: No edema, No lymphangitis, No petechiae, No rashes, no synovitis   Data Reviewed: I have personally reviewed following labs and imaging studies Basic Metabolic Panel:  Recent Labs Lab 08/15/17 1132 08/16/17 0406 08/17/17 0449  NA 133* 137 139  K 3.1* 3.5 3.4*  CL 86* 98* 100*  CO2 33* 30 30  GLUCOSE 142* 98 113*  BUN 67* 58* 50*  CREATININE 3.75* 3.09* 2.55*  CALCIUM 10.0 8.7* 9.1  MG  --  1.3* 1.6*   Liver Function Tests:  Recent Labs Lab 08/15/17 1132 08/16/17 0406 08/17/17 0449  AST 132* 88* 67*  ALT 96* 72* 65*  ALKPHOS 138* 101 100  BILITOT 1.3* 1.1 0.8  PROT 8.2* 6.1* 6.1*  ALBUMIN 3.9 3.0* 2.9*    Recent Labs Lab 08/15/17 1132  LIPASE 45   No results for input(s): AMMONIA in the last 168 hours. Coagulation Profile:  Recent Labs Lab 08/15/17 1130  INR 1.08   CBC:  Recent Labs Lab 08/15/17 1132 08/16/17 0406 08/17/17 0449  WBC 5.7 4.0 4.6  NEUTROABS 3.2  --   --   HGB 13.4 10.7* 10.5*  HCT 41.1 33.6* 33.1*  MCV  88.6 89.8 90.7  PLT 170 137* 138*   Cardiac Enzymes:  Recent Labs Lab 08/15/17 1130 08/15/17 1632 08/15/17 2307  TROPONINI 0.04* 0.03* 0.03*   BNP: Invalid input(s): POCBNP CBG: No results for input(s): GLUCAP in the last 168 hours. HbA1C:  Recent Labs  08/15/17 1546  HGBA1C 5.1   Urine analysis:    Component Value Date/Time   COLORURINE YELLOW 08/15/2017 1521   APPEARANCEUR HAZY (A) 08/15/2017 1521   LABSPEC 1.011 08/15/2017 1521   PHURINE 5.0 08/15/2017 1521   GLUCOSEU NEGATIVE 08/15/2017 1521   HGBUR NEGATIVE 08/15/2017 1521   BILIRUBINUR NEGATIVE 08/15/2017 1521   KETONESUR NEGATIVE 08/15/2017 1521   PROTEINUR NEGATIVE 08/15/2017 1521   UROBILINOGEN 0.2 11/12/2013 2315   NITRITE NEGATIVE 08/15/2017 1521   LEUKOCYTESUR LARGE (A) 08/15/2017 1521   Sepsis Labs: @LABRCNTIP (procalcitonin:4,lacticidven:4) ) Recent Results (from the past 240 hour(s))  Culture, Urine     Status: Abnormal   Collection Time: 08/15/17  3:21 PM  Result Value Ref Range Status   Specimen Description URINE, CLEAN CATCH  Final   Special Requests NONE  Final   Culture (A)  Final    <10,000 COLONIES/mL INSIGNIFICANT GROWTH Performed at [facility], [address] [place], [place] [address]    Report Status 08/17/2017 FINAL  Final  Culture, blood (Routine X 2) w Reflex to ID Panel     Status: None (Preliminary result)   Collection Time: 08/15/17  3:41 PM  Result Value Ref Range Status   Specimen Description RIGHT ANTECUBITAL  Final   Special Requests   Final    BOTTLES DRAWN AEROBIC AND ANAEROBIC Blood Culture adequate volume   Culture NO GROWTH 2 DAYS  Final   Report Status PENDING  Incomplete  MRSA PCR Screening     Status: None   Collection Time: 08/15/17  3:53 PM  Result Value Ref Range Status   MRSA by PCR NEGATIVE NEGATIVE Final    Comment:        The GeneXpert MRSA Assay (FDA approved for NASAL specimens only), is one component of a comprehensive MRSA  colonization surveillance program. It is not intended to diagnose MRSA infection nor to guide or monitor treatment for MRSA infections.   Culture, blood (Routine X 2) w Reflex to ID Panel     Status: None (Preliminary result)   Collection Time: 08/15/17  4:26 PM  Result Value Ref Range Status   Specimen Description BLOOD RIGHT HAND  Final   Special Requests   Final    BOTTLES DRAWN AEROBIC AND ANAEROBIC Blood Culture adequate volume   Culture NO GROWTH 2 DAYS  Final   Report Status PENDING  Incomplete     Scheduled Meds: . cholecalciferol  400 Units Oral BID  . clopidogrel  75 mg Oral Daily  . famotidine  20 mg Oral Daily  . feeding supplement  1 Container Oral QID  . folic acid  1 mg Oral Daily  . heparin  5,000 Units Subcutaneous Q8H  . levothyroxine  75 mcg Oral QAC breakfast  . multivitamin with minerals  1 tablet Oral Daily  . ondansetron (ZOFRAN) IV  4 mg Intravenous Q6H  . pantoprazole  40 mg Oral BID AC  . thiamine  100 mg Oral Daily  . vitamin C  500 mg Oral Daily   Continuous Infusions: . 0.9 % NaCl with KCl 40 mEq / L 50 mL/hr (08/17/17 0155)    Procedures/Studies: Ct Soft Tissue Neck Wo Contrast  Result Date: 08/16/2017 CLINICAL DATA:  Sore throat, difficulty swallowing. CV of syndrome. History of cirrhosis, alcoholism, esophageal dilatation. EXAM: CT NECK WITHOUT CONTRAST TECHNIQUE: Multidetector CT imaging of the neck was performed following the standard protocol without intravenous contrast. COMPARISON:  MRI of the head April 27, 2017 in CT chest April 27, 2017 FINDINGS: PHARYNX AND LARYNX: Negative larynx and pharynx. Preservation of parapharyngeal fat planes. SALIVARY GLANDS: Atrophic submandibular glands. Atrophic sublingual glands. Negative noncontrast parotid glands. THYROID: Normal. LYMPH NODES: 9 mm RIGHT superior mediastinal reniform lymph with adjacent smaller lymph nodes. VASCULAR: Moderate calcific atherosclerosis carotid siphons. Mild calcific  atherosclerosis aortic arch and carotid bifurcations. LIMITED INTRACRANIAL: Nonacute. VISUALIZED ORBITS: RIGHT globe prosthesis. Status post LEFT ocular lens implant. Old LEFT medial orbital blowout fracture with external herniation of extraconal fat, no extraocular muscle entrapment. MASTOIDS AND VISUALIZED PARANASAL SINUSES: Well-aerated. SKELETON: Nonacute. 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 Multilevel severe facet arthropathy. Severe RIGHT C4-5 and moderate to severe RIGHT C6-7 neural foraminal narrowing. UPPER CHEST: 6 mm LEFT upper lobe sub solid pulmonary nodule (series 4, image 106/110). OTHER: None. IMPRESSION: 1. No acute process in the neck.  Patent airway. 2. **An incidental finding of potential clinical significance has  been found. 6 mm LEFT upper lobe pulmonary nodule, new from prior CT though, this could represent confluence of vessels. Recommend follow-up CT chest at 6-12 months. This recommendation follows the consensus statement: Guidelines for Management of Small Pulmonary Nodules Detected on CT Scans: A Statement from the [place] as published in Radiology 2005; 237:395-400. ** Aortic Atherosclerosis (ICD10-I70.0). Electronically Signed   By: Elon Alas M.D.   On: 08/16/2017 22:14   Nm Gastric Emptying  Result Date: 08/16/2017 CLINICAL DATA:  76 year old male with nausea and vomiting for several months. EXAM: NUCLEAR MEDICINE GASTRIC EMPTYING SCAN TECHNIQUE: After oral ingestion of radiolabeled meal, sequential abdominal images were obtained for 120 minutes. Residual percentage of activity remaining within the stomach was calculated at 60 and 120 minutes. RADIOPHARMACEUTICALS:  2.1 mCi Tc-105msulfur colloid in standardized meal COMPARISON:  None. FINDINGS: Expected location of the stomach in the left upper quadrant. Ingested meal empties the stomach in the limited fashion with 96% retention at 60 min and 88% retention at 120 min (normal  retention less than 30% at a 120 min). IMPRESSION: Delayed gastric emptying. Electronically Signed   By: JMargarette CanadaM.D.   On: 08/16/2017 15:46   [place]Abdomen Limited Ruq  Result Date: 08/15/2017 CLINICAL DATA:  Abnormal LFTs EXAM: ULTRASOUND ABDOMEN LIMITED RIGHT UPPER QUADRANT COMPARISON:  04/27/2017 FINDINGS: Gallbladder: Gallbladder is well distended with evidence of tumefactive sludge. No wall thickening or pericholecystic fluid is noted. Negative sonographic Murphy's sign is seen. Common bile duct: Diameter: 3.5 mm. Liver: No focal lesion identified. Within normal limits in parenchymal echogenicity. Portal vein is patent on color Doppler imaging with normal direction of blood flow towards the liver. Echogenic lesion is noted between the margin of the right lobe of the liver and the right kidney consistent with the fatty lesion seen on prior CT examination within the right adrenal gland. IMPRESSION: Tumefactive sludge without complicating factors. No other focal abnormality is noted. Electronically Signed   By: MInez CatalinaM.D.   On: 08/15/2017 15:23    Nadirah Socorro, DO  [facility] Pager [phone] If 7PM-7AM, please contact night-coverage [email or web address] Password [email or web address] 08/17/2017, 11:48 AM   LOS: 2 days

## 2017-08-17 NOTE — Op Note (Addendum)
[facility] Patient Name: Justin Parsons Procedure Date: 08/17/2017 3:08 PM MRN: [id number] Date of Birth: 10/21/1941 Attending MD: Barney Drain MD, MD CSN: [id number] Age: 76 Admit Type: Inpatient Procedure:                Upper GI endoscopy WITH BALLOON AND SAVARY                            ESOPHAGEAL DILATION Indications:              Dysphagia/DRY MOUTH, Gastroparesis ON GES SEP 2018.                            ABLE TO TOLERATE LIQUIDS. Providers:                Barney Drain MD, MD, Lurline Del, RN, Bonnetta Barry,                            Technician Referring MD:             Fuller Canada Manuella Ghazi MD, MD Medicines:                Ondansetron 4 mg IV, Fentanyl 50 micrograms IV,                            Midazolam 5 mg IV Complications:            No immediate complications. Estimated Blood Loss:     Estimated blood loss was minimal. Procedure:                Pre-Anesthesia Assessment:                           - Prior to the procedure, a History and Physical                            was performed, and patient medications and                            allergies were reviewed. The patient's tolerance of                            previous anesthesia was also reviewed. The risks                            and benefits of the procedure and the sedation                            options and risks were discussed with the patient.                            All questions were answered, and informed consent                            was obtained. Prior Anticoagulants: The patient has  taken Plavix (clopidogrel), last dose was 1 day                            prior to procedure. ASA Grade Assessment: II - A                            patient with mild systemic disease. After reviewing                            the risks and benefits, the patient was deemed in                            satisfactory condition to undergo the procedure.      After obtaining informed consent, the endoscope was                            passed under direct vision. Throughout the                            procedure, the patient's blood pressure, pulse, and                            oxygen saturations were monitored continuously. The                            EG-[id number] ([id number]) scope was introduced through the                            mouth, and advanced to the second part of duodenum.                            The upper GI endoscopy was technically difficult                            and complex due to narrowing and the patient's                            agitation. Successful completion of the procedure                            was aided by increasing the dose of sedation                            medication and withdrawing the scope and replacing                            with the pediatric endoscope. The patient tolerated                            the procedure fairly well. Scope In: 3:20:25 PM Scope Out: 3:51:51 PM Total Procedure Duration: 0 hours 31 minutes 26 seconds  Findings:      One severe (stenosis; an endoscope cannot pass) benign-appearing,  intrinsic stenosis was found. This measured 9 mm (inner diameter) and       was traversed after downsizing scope and dilating. A guidewire was       placed and the scope was withdrawn. Dilation was performed with a Savary       dilator with mild resistance at 10 mm and moderate resistance at 11 mm       and 12 mm.      Diffuse mild inflammation characterized by congestion (edema) and       erythema was found in the entire examined stomach.      A benign-appearing, intrinsic moderate stenosis was found at the       pylorus. This was non-traversed. A TTS dilator was passed through the       scope. Dilation with a 08-30-11 mm pyloric balloon dilator was       performed. The dilation site was examined following endoscope       reinsertion and showed moderate improvement in  luminal narrowing.      The examined duodenum was normal. Impression:               - Benign-appearing esophageal stenosis.                           - MILD Gastritis.                           - Gastric stenosis was found at the pylorus. Moderate Sedation:      Moderate (conscious) sedation was administered by the endoscopy nurse       and supervised by the endoscopist. The following parameters were       monitored: oxygen saturation, heart rate, blood pressure, and response       to care. Total physician intraservice time was 36 minutes. Recommendation:           - Return patient to hospital ward. NEEDS                            RHEUMATOLOGY CONSULT AS AN OUTPATIENT FOR SALIVARY                            GLAND DYSFUNCTION.                           - Full liquid diet.                           - Continue present medications.                           - Repeat upper endoscopy in 1 month for retreatment                            WITH DR. Gala Romney.                           - Return to GI office in 6 months. Procedure Code(s):        --- Professional ---  46803, Esophagogastroduodenoscopy, flexible,                            transoral; with dilation of gastric/duodenal                            stricture(s) (eg, balloon, bougie)                           43248, Esophagogastroduodenoscopy, flexible,                            transoral; with insertion of guide wire followed by                            passage of dilator(s) through esophagus over guide                            wire                           99152, Moderate sedation services provided by the                            same physician or other qualified health care                            professional performing the diagnostic or                            therapeutic service that the sedation supports,                            requiring the presence of an independent trained                             observer to assist in the monitoring of the                            patient's level of consciousness and physiological                            status; initial 15 minutes of intraservice time,                            patient age 83 years or older                           [phone], Moderate sedation services; each additional                            15 minutes intraservice time Diagnosis Code(s):        --- Professional ---                           K22.2, Esophageal obstruction  K29.70, Gastritis, unspecified, without bleeding                           K31.1, Adult hypertrophic pyloric stenosis                           R13.10, Dysphagia, unspecified                           K31.84, Gastroparesis CPT copyright 2016 [employer]. All rights reserved. The codes documented in this report are preliminary and upon coder review may  be revised to meet current compliance requirements. Barney Drain, MD Barney Drain MD, MD 08/17/2017 4:13:24 PM This report has been signed electronically. Number of Addenda: 0

## 2017-08-17 NOTE — H&P (View-Only) (Signed)
Subjective: Feels better than yesterday. Some intermittent nausea persists, no further vomiting. Keeping down fluids well. Still weak. Denies abdominal pain. No bowel movement since admission. Mild and intermittent/rare dysphagia, history of esophageal stricture. No other GI complaints.  Objective: Vital signs in last 24 hours: Temp:  [97.4 F (36.3 C)-98.7 F (37.1 C)] 98 F (36.7 C) (09/28 0400) Pulse Rate:  [61-89] 76 (09/28 0900) Resp:  [10-23] 18 (09/28 0900) BP: (70-120)/(48-84) 88/64 (09/28 0900) SpO2:  [87 %-100 %] 98 % (09/28 0900) FiO2 (%):  [28 %] 28 % (09/27 2000) Weight:  [234 lb (106.1 kg)] 234 lb (106.1 kg) (09/27 1110) Last BM Date: 08/14/17 General:   Alert and oriented, pleasant Head:  Normocephalic and atraumatic. Eyes:  No icterus, sclera clear. Conjuctiva pink.  Heart:  S1, S2 present, no murmurs noted.  Lungs: Clear to auscultation bilaterally, without wheezing, rales, or rhonchi.  Abdomen:  Bowel sounds present, soft, non-tender, non-distended. No HSM or hernias noted. No rebound or guarding. No masses appreciated  Msk:  Symmetrical without gross deformities. Pulses:  Normal bilateral DP pulses noted. Extremities:  Without clubbing or edema. Neurologic:  Alert and  oriented x4;  grossly normal neurologically. Psych:  Alert and cooperative. Normal mood and affect.  Intake/Output from previous day: 09/27 0701 - 09/28 0700 In: 2498.3 [I.V.:1498.3; IV Piggyback:1000] Out: 650 [Urine:650] Intake/Output this shift: No intake/output data recorded.  Lab Results:  Recent Labs  08/15/17 1132 08/16/17 0406  WBC 5.7 4.0  HGB 13.4 10.7*  HCT 41.1 33.6*  PLT 170 137*   BMET  Recent Labs  08/15/17 1132 08/16/17 0406  NA 133* 137  K 3.1* 3.5  CL 86* 98*  CO2 33* 30  GLUCOSE 142* 98  BUN 67* 58*  CREATININE 3.75* 3.09*  CALCIUM 10.0 8.7*   LFT  Recent Labs  08/15/17 1132 08/16/17 0406  PROT 8.2* 6.1*  ALBUMIN 3.9 3.0*  AST 132* 88*   ALT 96* 72*  ALKPHOS 138* 101  BILITOT 1.3* 1.1  BILIDIR  --  0.4  IBILI  --  0.7   PT/INR  Recent Labs  08/15/17 1130  LABPROT 13.9  INR 1.08   Hepatitis Panel  Recent Labs  08/15/17 1547  HEPBSAG Negative  HCVAB <0.1     Studies/Results: US Abdomen Limited Ruq  Result Date: 08/15/2017 CLINICAL DATA:  Abnormal LFTs EXAM: ULTRASOUND ABDOMEN LIMITED RIGHT UPPER QUADRANT COMPARISON:  04/27/2017 FINDINGS: Gallbladder: Gallbladder is well distended with evidence of tumefactive sludge. No wall thickening or pericholecystic fluid is noted. Negative sonographic Murphy's sign is seen. Common bile duct: Diameter: 3.5 mm. Liver: No focal lesion identified. Within normal limits in parenchymal echogenicity. Portal vein is patent on color Doppler imaging with normal direction of blood flow towards the liver. Echogenic lesion is noted between the margin of the right lobe of the liver and the right kidney consistent with the fatty lesion seen on prior CT examination within the right adrenal gland. IMPRESSION: Tumefactive sludge without complicating factors. No other focal abnormality is noted. Electronically Signed   By: Inez Catalina M.D.   On: 08/15/2017 15:23    Assessment: 76 year old male currently at a nursing home for rehabilitation status post NSTEMI and stent placement approximately June 2018. Has atrial fibrillation, chronic kidney disease stage III. 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 Noted portal gastropathy. He presented to our office yesterday which point he was having dry heaving and retching  for the duration of the visit, complained of 2 months of progressive intractable nausea and vomiting and was found to be hypotensive in our office. He was referred to the emergency department and subsequently admitted to ICU for acute on chronic kidney injury, intractable nausea and vomiting, hypotension. He has been treated with when  necessary antibiotics and fluids. He is receiving electrolyte replacement.  Yesterday his labs included INR 1.08, CBC essentially normal, CMP with multiple derangements including hyponatremia at 133, hypokalemia at 3.1, creatinine elevated to 3.75, elevated liver functions with AST/ALT 132/96, alkaline phosphatase 138, bilirubin 1.3. His GFR was calculated to 14. Lipase was normal. C. difficile in GI panel pending. Blood cultures with no gross in less than 24 hours. His TSH was elevated at 6.239. Hepatitis B surface antigen negative, hepatitis C antibody negative.   His troponins were initially elevated 0.04 and improved to 0.03. This is deemed demand ischemia although the patient missed his post stent discharge follow-up visit with cardiology and will likely need a follow-up appointment scheduled for after discharge.  His labs today showing significant improvement including normalized sodium, potassium. Creatinine improved to 3.09. Hemoglobin declined to 10.7, likely rehydration and affect and multifactorial anemia including chronic kidney disease stage III and multiple comorbidities. No signs of bleeding subjectively or objectively. His magnesium is low at 1.3. His hepatic function panel improved with AST/ALT declined to 88/72, normal alkaline phosphatase, bilirubin.  He is clinically much improved today with some transient persistent nausea although no vomiting. Overall his labs are improved. There will likely continue to improve with gentle hydration. He does describe some intermittent/rare dysphagia with a history of esophageal stricture. He may benefit from repeat EGD with possible dilation as an outpatient. No urgent need at this time unless more significant dysphagia reveals itself.   Plan: 1. Schedule Zofran around the clock for persistent nausea. 2. Phenergan as needed. 3. Continue Protonix twice daily. 4. Continue clear liquid diet, advance as tolerated. 5. Will likely need cardiology  appointment as an outpatient. 6. Will need a follow-up office visit with our practice after discharge. 7. Supportive measures.   Thank you for allowing [place] to participate in the care of Justin Parsons Joyce Gross, DNP, AGNP-C Adult & Gerontological Nurse Practitioner [facility]     LOS: 1 day    08/16/2017, 10:10 AM

## 2017-08-17 NOTE — Plan of Care (Signed)
Problem: Nutrition: Goal: Adequate nutrition will be maintained Outcome: Not Progressing Clear liquid diet and NPO after midnight for studies in the morning. Pt does not express desire to eat, but does state his mouth is dry.

## 2017-08-18 DIAGNOSIS — I482 Chronic atrial fibrillation, unspecified: Secondary | ICD-10-CM

## 2017-08-18 DIAGNOSIS — R1110 Vomiting, unspecified: Secondary | ICD-10-CM

## 2017-08-18 LAB — BASIC METABOLIC PANEL
Anion gap: 7 (ref 5–15)
BUN: 41 mg/dL — ABNORMAL HIGH (ref 6–20)
CALCIUM: 9.1 mg/dL (ref 8.9–10.3)
CO2: 31 mmol/L (ref 22–32)
CREATININE: 2.19 mg/dL — AB (ref 0.61–1.24)
Chloride: 102 mmol/L (ref 101–111)
GFR calc Af Amer: 32 mL/min — ABNORMAL LOW (ref >60)
GFR calc non Af Amer: 27 mL/min — ABNORMAL LOW (ref >60)
GLUCOSE: 119 mg/dL — AB (ref 65–99)
Potassium: 4.3 mmol/L (ref 3.5–5.1)
Sodium: 140 mmol/L (ref 135–145)

## 2017-08-18 LAB — MAGNESIUM: Magnesium: 1.8 mg/dL (ref 1.7–2.4)

## 2017-08-18 MED ORDER — SODIUM CHLORIDE 0.9 % IV SOLN
INTRAVENOUS | Status: AC
  Administered 2017-08-18: 18:00:00 via INTRAVENOUS

## 2017-08-18 MED ORDER — METOPROLOL TARTRATE 25 MG PO TABS
12.5000 mg | ORAL_TABLET | Freq: Two times a day (BID) | ORAL | Status: DC
  Administered 2017-08-18 – 2017-08-19 (×2): 12.5 mg via ORAL
  Filled 2017-08-18 (×2): qty 1

## 2017-08-18 MED ORDER — TRAMADOL HCL 50 MG PO TABS: 50.0000 mg | ORAL_TABLET | Freq: Four times a day (QID) | ORAL | 0 refills | Status: AC | PRN

## 2017-08-18 MED ORDER — PANTOPRAZOLE SODIUM 40 MG PO TBEC: 40.0000 mg | DELAYED_RELEASE_TABLET | Freq: Two times a day (BID) | ORAL | 1 refills | Status: DC

## 2017-08-18 MED ORDER — FAMOTIDINE 20 MG PO TABS: 20.0000 mg | ORAL_TABLET | Freq: Every day | ORAL | 0 refills | Status: DC

## 2017-08-18 MED ORDER — METOPROLOL TARTRATE 25 MG PO TABS: 12.5000 mg | ORAL_TABLET | Freq: Two times a day (BID) | ORAL | 0 refills | Status: DC

## 2017-08-18 MED ORDER — BOOST / RESOURCE BREEZE PO LIQD: 1.0000 | Freq: Four times a day (QID) | ORAL | 0 refills | Status: DC

## 2017-08-18 NOTE — Progress Notes (Signed)
Patient ID: Justin Parsons, male   DOB: Oct 28, 1941, 76 y.o.   MRN: [id number]    Assessment/Plan: ADMITTED WITH NAUSEA/VOMITING/DIARRHEA/ABDOMINAL PAIN. CLINICALLY IMPROVED. EGD/DILATION OF ESOPHAGUS AND PYLORUS.   PLAN: 1. ADVANCE TO DYSPHAGIA 2 DIET. 2. NEED REPEAT EGD/DIL IN 1 MO 3. PROTONIX BID 4. DISCUSSED EGD/FINDINGS AND PLAN. PATIENT VOICED HIS UNDERSTANDING.    Subjective: Since I last evaluated the patient HE DENIES ABDOMINAL PAIN OR DIARRHEA. HE FEELS LIKE HIS SWALLOWING IS IMPROVED. TOLERATED OATMEAL THIS MORNING.  Objective: Vital signs in last 24 hours: Vitals:   08/17/17 2135 08/18/17 0510  BP: 108/78 109/71  Pulse: 91 97  Resp: 18 16  Temp: 98.3 F (36.8 C) 98.9 F (37.2 C)  SpO2: 100% 100%   General appearance: alert, cooperative and no distress Resp: clear to auscultation bilaterally Cardio: irregularly irregular rhythm GI: soft, non-tender; bowel sounds normal;   Lab Results:  K 4.3 Cr 2.19   Studies/Results: No results found.  Medications: I have reviewed the patient's current medications.   LOS: 5 days   Barney Drain 04/29/2014, 2:23 PM

## 2017-08-18 NOTE — Progress Notes (Signed)
PROGRESS NOTE  Justin Parsons JFH:[id number] DOB: 1941/01/18 DOA: 08/15/2017 PCP: Monico Blitz, MD  Brief History: 76 y.o.malewith medical history of coronary artery disease with NSTEMI June 2018, DES to ostial LAD 05/03/17, atrial fibrillation, CKD stage III, esophageal stricture, alcoholic cirrhosis, portal gastropathy, hypothyroidism presenting from the GI office secondary to hypotension. The patient has been complaining of two-month history of nausea and vomiting. The patient has had difficulty with liquids as well as solids. He has intermittent dysphagia. He states that he vomits shortly after drinking or eating anything. He has lost 50 pounds over the past 2-3 months. He complains of increasing generalized weakness to the point where he has had to use a walker and wheelchair. He denies any fevers, chills, chest pain, worsening shortness of breath, dysuria, hematuria, hematemesis, hematochezia, melena, abdominal pain. However, he states that his stomach does feel "funny and grinding". He denies any recent travels or eating any exotic foods. The patient had an EGD performed on 02/07/2015 which revealed a Schatzki's ring with a proximal peptic stricture status post dilatation and portal gastropathy. The patient was lost to follow-up with regard to his gastroenterologist until 08/15/2017. In the office, the patient was noted to have generalized weakness with hypotension. As result, the patient was sent to the emergency department for further evaluation.  Assessment/Plan: Hypotension -Likely secondary to volume depletion and intractable vomiting -Continue IV fluids-->improved -Check lactic acid--1.0 -Blood cultures 2 sets--neg -UA and urine culture<10K colony growth -hold coreg and lasix  Intractable nausea and vomiting and diarrhea -Consulted GI-->endoscopy 08/17/17 -NM gastic emptying-->delayed gastric emptying -CT neck--patent airway. 6 mm LUL lung nodule, atrophic  salivary glands, negative pharynx and larynx -prn zofran -check Cdiff--no BM since admission to collect -stool pathogen panel--no BM since admission to collect -continueIV protonix-->po -diet advanced to dysphagia 2 diet after EGD with dilt-->tolerating -08/17/17 EGD--diffuse mild inflammation with edema of the entire stomach. Severe intrinsic stenosis of the esophagus--dilated; gastric stenosis at the pylorus  Acute on chronic renal failure--CKD stage 3 -Secondary to volume depletion -Continue IV fluids-->improved -d/c colchicine -Baseline creatinine 1.8-2.2 -Presenting creatinine 3.75 -am BMP  Transaminasemia -likely due to hypotension -RUQ US--normal liver parenchyma, GBsludge without thickening -hep B surface antigen--negative -hep c antibody--negative -am LFTs--improving  Delayed gastric emptying -confirmed by NM gastric emptying study  Coronary artery disease with history NSTEMI/Elevated troponin -no angina -finish cycling troponins--flat, 0.04>>0.03>>0.03 -elevated troponins likely demand ischemia -EKG--atrial fibrillation, LBBB -04/28/2017 echo EF 50-55%, PASP 49  Chronic Atrial fibrillation -The patient was a poor candidate for ACdue to his portal gastropathy -The decision was made to continue the patient on DAPT at time of d/c in June 2018 -rate controlled -continue DAPT  Chronic respiratory failure with hypoxia -stable on his baseline 2L -wean if possible for sat >92%  Hyperglycemia -Check hemoglobin A1c--5.1  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  Hypothyroidism -TSH--6.239 -continue present dose synthroid -repeat TSH in 3-4 weeks  Hypokalemia/Hypomagenesemia -replete  Acute urinary retention -foley placed 9/28 -foley removed 9/30-->voiding spontaneously   Disposition Plan: [facility] 10/01 Family Communication: No family present   Consultants: GI  Code Status: FULL  DVT Prophylaxis: [place]  Heparin   Procedures: As Listed in Progress Note Above  Antibiotics: None   Subjective: Patient denies fevers, chills, headache, chest pain, dyspnea, nausea, vomiting, diarrhea, abdominal pain, dysuria, hematuria, hematochezia, and melena.   Objective: Vitals:   08/17/17 1655 08/17/17 2135 08/18/17 0510 08/18/17 1519  BP: (!) 106/92 108/78 109/71 Marland Kitchen)  100/57  Pulse:  91 97 (!) 105  Resp:  18 16 16   Temp:  98.3 F (36.8 C) 98.9 F (37.2 C) 98.5 F (36.9 C)  TempSrc:  Oral Oral Oral  SpO2:  100% 100% 100%  Weight:      Height:        Intake/Output Summary (Last 24 hours) at 08/18/17 1645 Last data filed at 08/18/17 1617  Gross per 24 hour  Intake             3100 ml  Output              700 ml  Net             2400 ml   Weight change:  Exam:   General:  Pt is alert, follows commands appropriately, not in acute distress  HEENT: No icterus, No thrush, No neck mass, [place]/AT  Cardiovascular: IRRR, S1/S2, no rubs, no gallops  Respiratory: Diminished breath sounds at the bases but clear to auscultation. No wheezing  Abdomen: Soft/+BS, non tender, non distended, no guarding  Extremities: trace LE edema, No lymphangitis, No petechiae, No rashes, no synovitis   Data Reviewed: I have personally reviewed following labs and imaging studies Basic Metabolic Panel:  Recent Labs Lab 08/15/17 1132 08/16/17 0406 08/17/17 0449 08/18/17 0556  NA 133* 137 139 140  K 3.1* 3.5 3.4* 4.3  CL 86* 98* 100* 102  CO2 33* 30 30 31   GLUCOSE 142* 98 113* 119*  BUN 67* 58* 50* 41*  CREATININE 3.75* 3.09* 2.55* 2.19*  CALCIUM 10.0 8.7* 9.1 9.1  MG  --  1.3* 1.6* 1.8   Liver Function Tests:  Recent Labs Lab 08/15/17 1132 08/16/17 0406 08/17/17 0449  AST 132* 88* 67*  ALT 96* 72* 65*  ALKPHOS 138* 101 100  BILITOT 1.3* 1.1 0.8  PROT 8.2* 6.1* 6.1*  ALBUMIN 3.9 3.0* 2.9*    Recent Labs Lab 08/15/17 1132  LIPASE 45   No results for input(s): AMMONIA in the last  168 hours. Coagulation Profile:  Recent Labs Lab 08/15/17 1130  INR 1.08   CBC:  Recent Labs Lab 08/15/17 1132 08/16/17 0406 08/17/17 0449  WBC 5.7 4.0 4.6  NEUTROABS 3.2  --   --   HGB 13.4 10.7* 10.5*  HCT 41.1 33.6* 33.1*  MCV 88.6 89.8 90.7  PLT 170 137* 138*   Cardiac Enzymes:  Recent Labs Lab 08/15/17 1130 08/15/17 1632 08/15/17 2307  TROPONINI 0.04* 0.03* 0.03*   BNP: Invalid input(s): POCBNP CBG: No results for input(s): GLUCAP in the last 168 hours. HbA1C: No results for input(s): HGBA1C in the last 72 hours. Urine analysis:    Component Value Date/Time   COLORURINE YELLOW 08/15/2017 1521   APPEARANCEUR HAZY (A) 08/15/2017 1521   LABSPEC 1.011 08/15/2017 1521   PHURINE 5.0 08/15/2017 1521   GLUCOSEU NEGATIVE 08/15/2017 1521   HGBUR NEGATIVE 08/15/2017 1521   BILIRUBINUR NEGATIVE 08/15/2017 1521   KETONESUR NEGATIVE 08/15/2017 1521   PROTEINUR NEGATIVE 08/15/2017 1521   UROBILINOGEN 0.2 11/12/2013 2315   NITRITE NEGATIVE 08/15/2017 1521   LEUKOCYTESUR LARGE (A) 08/15/2017 1521   Sepsis Labs: @LABRCNTIP (procalcitonin:4,lacticidven:4) ) Recent Results (from the past 240 hour(s))  Culture, Urine     Status: Abnormal   Collection Time: 08/15/17  3:21 PM  Result Value Ref Range Status   Specimen Description URINE, CLEAN CATCH  Final   Special Requests NONE  Final   Culture (A)  Final    <10,000  COLONIES/mL INSIGNIFICANT GROWTH Performed at [facility] [address] [place], [place] [address]    Report Status 08/17/2017 FINAL  Final  Culture, blood (Routine X 2) w Reflex to ID Panel     Status: None (Preliminary result)   Collection Time: 08/15/17  3:41 PM  Result Value Ref Range Status   Specimen Description RIGHT ANTECUBITAL  Final   Special Requests   Final    BOTTLES DRAWN AEROBIC AND ANAEROBIC Blood Culture adequate volume   Culture NO GROWTH 3 DAYS  Final   Report Status PENDING  Incomplete  MRSA PCR Screening     Status:  None   Collection Time: 08/15/17  3:53 PM  Result Value Ref Range Status   MRSA by PCR NEGATIVE NEGATIVE Final    Comment:        The GeneXpert MRSA Assay (FDA approved for NASAL specimens only), is one component of a comprehensive MRSA colonization surveillance program. It is not intended to diagnose MRSA infection nor to guide or monitor treatment for MRSA infections.   Culture, blood (Routine X 2) w Reflex to ID Panel     Status: None (Preliminary result)   Collection Time: 08/15/17  4:26 PM  Result Value Ref Range Status   Specimen Description BLOOD RIGHT HAND  Final   Special Requests   Final    BOTTLES DRAWN AEROBIC AND ANAEROBIC Blood Culture adequate volume   Culture NO GROWTH 3 DAYS  Final   Report Status PENDING  Incomplete     Scheduled Meds: . cholecalciferol  400 Units Oral BID  . clopidogrel  75 mg Oral Daily  . famotidine  20 mg Oral Daily  . feeding supplement  1 Container Oral QID  . folic acid  1 mg Oral Daily  . heparin  5,000 Units Subcutaneous Q8H  . levothyroxine  75 mcg Oral QAC breakfast  . multivitamin with minerals  1 tablet Oral Daily  . ondansetron (ZOFRAN) IV  4 mg Intravenous Q6H  . pantoprazole  40 mg Oral BID AC  . thiamine  100 mg Oral Daily  . vitamin C  500 mg Oral Daily   Continuous Infusions: . 0.9 % NaCl with KCl 40 mEq / L 50 mL/hr (08/18/17 0230)    Procedures/Studies: Ct Soft Tissue Neck Wo Contrast  Result Date: 08/16/2017 CLINICAL DATA:  Sore throat, difficulty swallowing. CV of syndrome. History of cirrhosis, alcoholism, esophageal dilatation. EXAM: CT NECK WITHOUT CONTRAST TECHNIQUE: Multidetector CT imaging of the neck was performed following the standard protocol without intravenous contrast. COMPARISON:  MRI of the head April 27, 2017 in CT chest April 27, 2017 FINDINGS: PHARYNX AND LARYNX: Negative larynx and pharynx. Preservation of parapharyngeal fat planes. SALIVARY GLANDS: Atrophic submandibular glands. Atrophic sublingual  glands. Negative noncontrast parotid glands. THYROID: Normal. LYMPH NODES: 9 mm RIGHT superior mediastinal reniform lymph with adjacent smaller lymph nodes. VASCULAR: Moderate calcific atherosclerosis carotid siphons. Mild calcific atherosclerosis aortic arch and carotid bifurcations. LIMITED INTRACRANIAL: Nonacute. VISUALIZED ORBITS: RIGHT globe prosthesis. Status post LEFT ocular lens implant. Old LEFT medial orbital blowout fracture with external herniation of extraconal fat, no extraocular muscle entrapment. MASTOIDS AND VISUALIZED PARANASAL SINUSES: Well-aerated. SKELETON: Nonacute. 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 Multilevel severe facet arthropathy. Severe RIGHT C4-5 and moderate to severe RIGHT C6-7 neural foraminal narrowing. UPPER CHEST: 6 mm LEFT upper lobe sub solid pulmonary nodule (series 4, image 106/110). OTHER: None. IMPRESSION: 1. No acute process in the neck.  Patent  airway. 2. **An incidental finding of potential clinical significance has been found. 6 mm LEFT upper lobe pulmonary nodule, new from prior CT though, this could represent confluence of vessels. Recommend follow-up CT chest at 6-12 months. This recommendation follows the consensus statement: Guidelines for Management of Small Pulmonary Nodules Detected on CT Scans: A Statement from the [employer] as published in Radiology 2005; 237:395-400. ** Aortic Atherosclerosis (ICD10-I70.0). Electronically Signed   By: Elon Alas M.D.   On: 08/16/2017 22:14   Nm Gastric Emptying  Result Date: 08/16/2017 CLINICAL DATA:  76 year old male with nausea and vomiting for several months. EXAM: NUCLEAR MEDICINE GASTRIC EMPTYING SCAN TECHNIQUE: After oral ingestion of radiolabeled meal, sequential abdominal images were obtained for 120 minutes. Residual percentage of activity remaining within the stomach was calculated at 60 and 120 minutes. RADIOPHARMACEUTICALS:  2.1 mCi Tc-15msulfur colloid in  standardized meal COMPARISON:  None. FINDINGS: Expected location of the stomach in the left upper quadrant. Ingested meal empties the stomach in the limited fashion with 96% retention at 60 min and 88% retention at 120 min (normal retention less than 30% at a 120 min). IMPRESSION: Delayed gastric emptying. Electronically Signed   By: JMargarette CanadaM.D.   On: 08/16/2017 15:46   [place]domen Limited Ruq  Result Date: 08/15/2017 CLINICAL DATA:  Abnormal LFTs EXAM: ULTRASOUND ABDOMEN LIMITED RIGHT UPPER QUADRANT COMPARISON:  04/27/2017 FINDINGS: Gallbladder: Gallbladder is well distended with evidence of tumefactive sludge. No wall thickening or pericholecystic fluid is noted. Negative sonographic Murphy's sign is seen. Common bile duct: Diameter: 3.5 mm. Liver: No focal lesion identified. Within normal limits in parenchymal echogenicity. Portal vein is patent on color Doppler imaging with normal direction of blood flow towards the liver. Echogenic lesion is noted between the margin of the right lobe of the liver and the right kidney consistent with the fatty lesion seen on prior CT examination within the right adrenal gland. IMPRESSION: Tumefactive sludge without complicating factors. No other focal abnormality is noted. Electronically Signed   By: MInez CatalinaM.D.   On: 08/15/2017 15:23    Kinser Fellman, DO  [facility] Pager [phone] If 7PM-7AM, please contact night-coverage [email or web address] Password [email or web address] 08/18/2017, 4:45 PM   LOS: 3 days

## 2017-08-18 NOTE — Discharge Summary (Signed)
Physician Discharge Summary  Justin Parsons MPN:[id number] DOB: [birth year] DOA: 08/15/2017  PCP: Monico Blitz, MD  Admit date: 08/15/2017 Discharge date: 08/19/2017  Admitted From: SNF Disposition:  SNF  Recommendations for Outpatient Follow-up:  1. Follow up with PCP in 1-2 weeks 2. Please obtain BMP/CBC in one week    Discharge Condition: Stable CODE STATUS: FULL Diet recommendation: Dysphagia 2 with thin liquids; heart healthy   Brief/Interim Summary: 76 y.o.malewith medical history of coronary artery disease with NSTEMI June 2018, DES to ostial LAD 05/03/17, atrial fibrillation, CKD stage III, esophageal stricture, alcoholic cirrhosis, portal gastropathy, hypothyroidism presenting from the GI office secondary to hypotension. The patient has been complaining of two-month history of nausea and vomiting. The patient has had difficulty with liquids as well as solids. He has intermittent dysphagia. He states that he vomits shortly after drinking or eating anything. He has lost 50 pounds over the past 2-3 months. He complains of increasing generalized weakness to the point where he has had to use a walker and wheelchair. He denies any fevers, chills, chest pain, worsening shortness of breath, dysuria, hematuria, hematemesis, hematochezia, melena, abdominal pain. However, he states that his stomach does feel "funny and grinding". He denies any recent travels or eating any exotic foods. The patient had an EGD performed on 02/07/2015 which revealed a Schatzki's ring with a proximal peptic stricture status post dilatation and portal gastropathy. The patient was lost to follow-up with regard to his gastroenterologist until 08/15/2017. In the office, the patient was noted to have generalized weakness with hypotension. As result, the patient was sent to the emergency department for further evaluation.  Discharge Diagnoses:  Hypotension -Likely secondary to volume depletion and intractable  vomiting -Continue IV fluids-->improved -Check lactic acid--1.0 -Blood cultures 2 sets--neg -UA and urine culture<10K colony growth -hold coreg and lasix  Intractable nausea and vomiting and diarrhea/Esophageal and pyloric stricture -ConsultedGI-->endoscopy 08/17/17 -NM gastic emptying-->delayed gastric emptying -CT neck--patent airway. 6 mm LULlung nodule, atrophic salivary glands, negative pharynx and larynx -prn zofran -check Cdiff--no BM since admission to collect -stool pathogen panel--no BM since admission to collect -continueIV protonix-->po -diet advanced to dysphagia 2 diet after EGD with dilitation-->tolerating -08/17/17 EGD--diffuse mild inflammation with edema of the entire stomach. Severe intrinsic stenosis of the esophagus--dilated; gastric stenosis at the pylorus  Acute on chronic renal failure--CKD stage 3 -Secondary to volume depletion -Continue IV fluids-->improved -d/c colchicine -Baseline creatinine 1.8-2.2 -Presenting creatinine 3.75 -serum creatinine 1.85 on day of d/c  Gouty arthritis -d/c colchicine due to renal failure -prednisone 50 mg po daily x 3 days -start allopurinol 100 mg daily  Transaminasemia -likely due to hypotension -RUQ US--normal liver parenchyma, GBsludge without thickening -hep B surface antigen--negative -hep c antibody--negative -am LFTs--improving  Delayed gastric emptying -confirmed by NM gastric emptying study  Coronary artery disease with history NSTEMI/Elevated troponin -no angina -finish cycling troponins--flat, 0.04>>0.03>>0.03 -elevated troponins likely demand ischemia -EKG--atrial fibrillation, LBBB -04/28/2017 echo EF 50-55%, PASP 49  Chronic Atrial fibrillation -The patient was a poor candidate for ACdue to his portal gastropathy -The decision was made to continue the patient on DAPT at time of d/c in June 2018 -rate controlled -continue DAPT -start metoprolol tartrate  Chronic respiratory failure  with hypoxia -stable on his baseline 2L -wean if possible for sat >92%  Hyperglycemia -Check hemoglobin A1c--5.1  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  Hypothyroidism -TSH--6.239 -continue present dose synthroid -repeat TSH in 3-4 weeks  Hypokalemia/Hypomagenesemia -replete  Acute urinary retention -foley placed 9/28 -foley removed  9/30-->voiding spontaneously   Discharge Instructions  Discharge Instructions    Diet - low sodium heart healthy    Complete by:  As directed    Increase activity slowly    Complete by:  As directed      Allergies as of 08/19/2017   No Known Allergies     Medication List    STOP taking these medications   carvedilol 6.25 MG tablet Commonly known as:  COREG   colchicine 0.6 MG tablet   furosemide 40 MG tablet Commonly known as:  LASIX   ranitidine 75 MG tablet Commonly known as:  ZANTAC Replaced by:  famotidine 20 MG tablet     TAKE these medications   allopurinol 100 MG tablet Commonly known as:  ZYLOPRIM Take 1 tablet (100 mg total) by mouth daily.   calcium carbonate 500 MG chewable tablet Commonly known as:  TUMS - dosed in mg elemental calcium Chew 1 tablet by mouth 2 (two) times daily.   clopidogrel 75 MG tablet Commonly known as:  PLAVIX Take 1 tablet (75 mg total) by mouth daily.   docusate sodium 100 MG capsule Commonly known as:  COLACE Take 1 capsule (100 mg total) by mouth 2 (two) times daily. What changed:  when to take this   famotidine 20 MG tablet Commonly known as:  PEPCID Take 1 tablet (20 mg total) by mouth daily. Replaces:  ranitidine 75 MG tablet   feeding supplement Liqd Take 1 Container by mouth 4 (four) times daily.   folic acid 1 MG tablet Commonly known as:  FOLVITE Take 1 tablet (1 mg total) by mouth daily.   levothyroxine 75 MCG tablet Commonly known as:  SYNTHROID, LEVOTHROID Take 75 mcg by mouth daily before breakfast.   Melatonin 3 MG Tabs Take 1 tablet by  mouth at bedtime.   metoprolol tartrate 25 MG tablet Commonly known as:  LOPRESSOR Take 0.5 tablets (12.5 mg total) by mouth 2 (two) times daily.   multivitamin tablet Take 1 tablet by mouth daily.   omeprazole 40 MG capsule Commonly known as:  PRILOSEC Take 1 capsule (40 mg total) by mouth 2 (two) times daily.   ondansetron 4 MG tablet Commonly known as:  ZOFRAN Take 4 mg by mouth every 8 (eight) hours as needed for nausea or vomiting.   pantoprazole 40 MG tablet Commonly known as:  PROTONIX Take 1 tablet (40 mg total) by mouth 2 (two) times daily before a meal.   polyethylene glycol packet Commonly known as:  MIRALAX / GLYCOLAX Take 17 g by mouth daily as needed for mild constipation. What changed:  when to take this   predniSONE 50 MG tablet Commonly known as:  DELTASONE Take 1 tablet (50 mg total) by mouth daily with breakfast.   thiamine 100 MG tablet Take 1 tablet (100 mg total) by mouth daily.   traMADol 50 MG tablet Commonly known as:  ULTRAM Take 1 tablet (50 mg total) by mouth every 6 (six) hours as needed.   vitamin C 500 MG tablet Commonly known as:  ASCORBIC ACID Take 500 mg by mouth daily.   Vitamin D2 400 units Tabs Take 1 tablet by mouth 2 (two) times daily.            Discharge Care Instructions        Start     Ordered   08/19/17 0000  allopurinol (ZYLOPRIM) 100 MG tablet  Daily     08/19/17 0745   08/19/17 0000  predniSONE (DELTASONE) 50 MG tablet  Daily with breakfast     08/19/17 0745   08/19/17 0000  Increase activity slowly     08/19/17 0745   08/19/17 0000  Diet - low sodium heart healthy     08/19/17 0745   08/18/17 0000  feeding supplement (BOOST / RESOURCE BREEZE) LIQD  4 times daily     08/18/17 1717   08/18/17 0000  metoprolol tartrate (LOPRESSOR) 25 MG tablet  2 times daily     08/18/17 1717   08/18/17 0000  pantoprazole (PROTONIX) 40 MG tablet  2 times daily before meals     08/18/17 1717   08/18/17 0000  famotidine  (PEPCID) 20 MG tablet  Daily     08/18/17 1717   08/18/17 0000  traMADol (ULTRAM) 50 MG tablet  Every 6 hours PRN     08/18/17 1717      No Known Allergies  Consultations:  GI--Fields   Procedures/Studies: Ct Soft Tissue Neck Wo Contrast  Result Date: 08/16/2017 CLINICAL DATA:  Sore throat, difficulty swallowing. CV of syndrome. History of cirrhosis, alcoholism, esophageal dilatation. EXAM: CT NECK WITHOUT CONTRAST TECHNIQUE: Multidetector CT imaging of the neck was performed following the standard protocol without intravenous contrast. COMPARISON:  MRI of the head April 27, 2017 in CT chest April 27, 2017 FINDINGS: PHARYNX AND LARYNX: Negative larynx and pharynx. Preservation of parapharyngeal fat planes. SALIVARY GLANDS: Atrophic submandibular glands. Atrophic sublingual glands. Negative noncontrast parotid glands. THYROID: Normal. LYMPH NODES: 9 mm RIGHT superior mediastinal reniform lymph with adjacent smaller lymph nodes. VASCULAR: Moderate calcific atherosclerosis carotid siphons. Mild calcific atherosclerosis aortic arch and carotid bifurcations. LIMITED INTRACRANIAL: Nonacute. VISUALIZED ORBITS: RIGHT globe prosthesis. Status post LEFT ocular lens implant. Old LEFT medial orbital blowout fracture with external herniation of extraconal fat, no extraocular muscle entrapment. MASTOIDS AND VISUALIZED PARANASAL SINUSES: Well-aerated. SKELETON: Nonacute. 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 Multilevel severe facet arthropathy. Severe RIGHT C4-5 and moderate to severe RIGHT C6-7 neural foraminal narrowing. UPPER CHEST: 6 mm LEFT upper lobe sub solid pulmonary nodule (series 4, image 106/110). OTHER: None. IMPRESSION: 1. No acute process in the neck.  Patent airway. 2. **An incidental finding of potential clinical significance has been found. 6 mm LEFT upper lobe pulmonary nodule, new from prior CT though, this could represent confluence of vessels. Recommend follow-up CT  chest at 6-12 months. This recommendation follows the consensus statement: Guidelines for Management of Small Pulmonary Nodules Detected on CT Scans: A Statement from the [place] as published in Radiology 2005; 237:395-400. ** Aortic Atherosclerosis (ICD10-I70.0). Electronically Signed   By: Elon Alas M.D.   On: 08/16/2017 22:14   Nm Gastric Emptying  Result Date: 08/16/2017 CLINICAL DATA:  76 year old male with nausea and vomiting for several months. EXAM: NUCLEAR MEDICINE GASTRIC EMPTYING SCAN TECHNIQUE: After oral ingestion of radiolabeled meal, sequential abdominal images were obtained for 120 minutes. Residual percentage of activity remaining within the stomach was calculated at 60 and 120 minutes. RADIOPHARMACEUTICALS:  2.1 mCi Tc-54msulfur colloid in standardized meal COMPARISON:  None. FINDINGS: Expected location of the stomach in the left upper quadrant. Ingested meal empties the stomach in the limited fashion with 96% retention at 60 min and 88% retention at 120 min (normal retention less than 30% at a 120 min). IMPRESSION: Delayed gastric emptying. Electronically Signed   By: JMargarette CanadaM.D.   On: 08/16/2017 15:46   [place]domen Limited Ruq  Result Date: 08/15/2017 CLINICAL DATA:  Abnormal LFTs EXAM: ULTRASOUND ABDOMEN LIMITED RIGHT UPPER QUADRANT COMPARISON:  04/27/2017 FINDINGS: Gallbladder: Gallbladder is well distended with evidence of tumefactive sludge. No wall thickening or pericholecystic fluid is noted. Negative sonographic Murphy's sign is seen. Common bile duct: Diameter: 3.5 mm. Liver: No focal lesion identified. Within normal limits in parenchymal echogenicity. Portal vein is patent on color Doppler imaging with normal direction of blood flow towards the liver. Echogenic lesion is noted between the margin of the right lobe of the liver and the right kidney consistent with the fatty lesion seen on prior CT examination within the right adrenal gland. IMPRESSION:  Tumefactive sludge without complicating factors. No other focal abnormality is noted. Electronically Signed   By: Inez Catalina M.D.   On: 08/15/2017 15:23        Discharge Exam: Vitals:   08/18/17 2131 08/19/17 0453  BP: 121/85 (!) 101/56  Pulse: (!) 117 93  Resp: 16 16  Temp: 98.8 F (37.1 C) 98.5 F (36.9 C)  SpO2: 100% 100%   Vitals:   08/18/17 0510 08/18/17 1519 08/18/17 2131 08/19/17 0453  BP: 109/71 (!) 100/57 121/85 (!) 101/56  Pulse: 97 (!) 105 (!) 117 93  Resp: 16 16 16 16   Temp: 98.9 F (37.2 C) 98.5 F (36.9 C) 98.8 F (37.1 C) 98.5 F (36.9 C)  TempSrc: Oral Oral Oral Oral  SpO2: 100% 100% 100% 100%  Weight:      Height:        General: Pt is alert, awake, not in acute distress Cardiovascular: IRRR, S1/S2 +, no rubs, no gallops Respiratory: bibasilar crackles, no wheeze Abdominal: Soft, NT, ND, bowel sounds + Extremities: trace LE  edema, no cyanosis   The results of significant diagnostics from this hospitalization (including imaging, microbiology, ancillary and laboratory) are listed below for reference.    Significant Diagnostic Studies: Ct Soft Tissue Neck Wo Contrast  Result Date: 08/16/2017 CLINICAL DATA:  Sore throat, difficulty swallowing. CV of syndrome. History of cirrhosis, alcoholism, esophageal dilatation. EXAM: CT NECK WITHOUT CONTRAST TECHNIQUE: Multidetector CT imaging of the neck was performed following the standard protocol without intravenous contrast. COMPARISON:  MRI of the head April 27, 2017 in CT chest April 27, 2017 FINDINGS: PHARYNX AND LARYNX: Negative larynx and pharynx. Preservation of parapharyngeal fat planes. SALIVARY GLANDS: Atrophic submandibular glands. Atrophic sublingual glands. Negative noncontrast parotid glands. THYROID: Normal. LYMPH NODES: 9 mm RIGHT superior mediastinal reniform lymph with adjacent smaller lymph nodes. VASCULAR: Moderate calcific atherosclerosis carotid siphons. Mild calcific atherosclerosis aortic arch  and carotid bifurcations. LIMITED INTRACRANIAL: Nonacute. VISUALIZED ORBITS: RIGHT globe prosthesis. Status post LEFT ocular lens implant. Old LEFT medial orbital blowout fracture with external herniation of extraconal fat, no extraocular muscle entrapment. MASTOIDS AND VISUALIZED PARANASAL SINUSES: Well-aerated. SKELETON: Nonacute. 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 Multilevel severe facet arthropathy. Severe RIGHT C4-5 and moderate to severe RIGHT C6-7 neural foraminal narrowing. UPPER CHEST: 6 mm LEFT upper lobe sub solid pulmonary nodule (series 4, image 106/110). OTHER: None. IMPRESSION: 1. No acute process in the neck.  Patent airway. 2. **An incidental finding of potential clinical significance has been found. 6 mm LEFT upper lobe pulmonary nodule, new from prior CT though, this could represent confluence of vessels. Recommend follow-up CT chest at 6-12 months. This recommendation follows the consensus statement: Guidelines for Management of Small Pulmonary Nodules Detected on CT Scans: A Statement from the [place] as published in Radiology 2005; 237:395-400. ** Aortic Atherosclerosis (ICD10-I70.0). Electronically Signed  By: Elon Alas M.D.   On: 08/16/2017 22:14   Nm Gastric Emptying  Result Date: 08/16/2017 CLINICAL DATA:  76 year old male with nausea and vomiting for several months. EXAM: NUCLEAR MEDICINE GASTRIC EMPTYING SCAN TECHNIQUE: After oral ingestion of radiolabeled meal, sequential abdominal images were obtained for 120 minutes. Residual percentage of activity remaining within the stomach was calculated at 60 and 120 minutes. RADIOPHARMACEUTICALS:  2.1 mCi Tc-27msulfur colloid in standardized meal COMPARISON:  None. FINDINGS: Expected location of the stomach in the left upper quadrant. Ingested meal empties the stomach in the limited fashion with 96% retention at 60 min and 88% retention at 120 min (normal retention less than 30% at a 120  min). IMPRESSION: Delayed gastric emptying. Electronically Signed   By: JMargarette CanadaM.D.   On: 08/16/2017 15:46   [place]domen Limited Ruq  Result Date: 08/15/2017 CLINICAL DATA:  Abnormal LFTs EXAM: ULTRASOUND ABDOMEN LIMITED RIGHT UPPER QUADRANT COMPARISON:  04/27/2017 FINDINGS: Gallbladder: Gallbladder is well distended with evidence of tumefactive sludge. No wall thickening or pericholecystic fluid is noted. Negative sonographic Murphy's sign is seen. Common bile duct: Diameter: 3.5 mm. Liver: No focal lesion identified. Within normal limits in parenchymal echogenicity. Portal vein is patent on color Doppler imaging with normal direction of blood flow towards the liver. Echogenic lesion is noted between the margin of the right lobe of the liver and the right kidney consistent with the fatty lesion seen on prior CT examination within the right adrenal gland. IMPRESSION: Tumefactive sludge without complicating factors. No other focal abnormality is noted. Electronically Signed   By: MInez CatalinaM.D.   On: 08/15/2017 15:23     Microbiology: Recent Results (from the past 240 hour(s))  Culture, Urine     Status: Abnormal   Collection Time: 08/15/17  3:21 PM  Result Value Ref Range Status   Specimen Description URINE, CLEAN CATCH  Final   Special Requests NONE  Final   Culture (A)  Final    <10,000 COLONIES/mL INSIGNIFICANT GROWTH [facility] [address] [place] [address]   Report Status 08/17/2017 FINAL  Final  Culture, blood (Routine X 2) w Reflex to ID Panel     Status: None (Preliminary result)   Collection Time: 08/15/17  3:41 PM  Result Value Ref Range Status   Specimen Description RIGHT ANTECUBITAL  Final   Special Requests   Final    BOTTLES DRAWN AEROBIC AND ANAEROBIC Blood Culture adequate volume   Culture NO GROWTH 4 DAYS  Final   Report Status PENDING  Incomplete  MRSA PCR Screening     Status: None   Collection Time: 08/15/17  3:53 PM  Result  Value Ref Range Status   MRSA by PCR NEGATIVE NEGATIVE Final    Comment:        The GeneXpert MRSA Assay (FDA approved for NASAL specimens only), is one component of a comprehensive MRSA colonization surveillance program. It is not intended to diagnose MRSA infection nor to guide or monitor treatment for MRSA infections.   Culture, blood (Routine X 2) w Reflex to ID Panel     Status: None (Preliminary result)   Collection Time: 08/15/17  4:26 PM  Result Value Ref Range Status   Specimen Description BLOOD RIGHT HAND  Final   Special Requests   Final    BOTTLES DRAWN AEROBIC AND ANAEROBIC Blood Culture adequate volume   Culture NO GROWTH 4 DAYS  Final   Report  Status PENDING  Incomplete     Labs: Basic Metabolic Panel:  Recent Labs Lab 08/15/17 1132 08/16/17 0406 08/17/17 0449 08/18/17 0556 08/19/17 0610  NA 133* 137 139 140 137  K 3.1* 3.5 3.4* 4.3 4.3  CL 86* 98* 100* 102 102  CO2 33* 30 30 31 28   GLUCOSE 142* 98 113* 119* 118*  BUN 67* 58* 50* 41* 33*  CREATININE 3.75* 3.09* 2.55* 2.19* 1.85*  CALCIUM 10.0 8.7* 9.1 9.1 8.9  MG  --  1.3* 1.6* 1.8  --    Liver Function Tests:  Recent Labs Lab 08/15/17 1132 08/16/17 0406 08/17/17 0449  AST 132* 88* 67*  ALT 96* 72* 65*  ALKPHOS 138* 101 100  BILITOT 1.3* 1.1 0.8  PROT 8.2* 6.1* 6.1*  ALBUMIN 3.9 3.0* 2.9*    Recent Labs Lab 08/15/17 1132  LIPASE 45   No results for input(s): AMMONIA in the last 168 hours. CBC:  Recent Labs Lab 08/15/17 1132 08/16/17 0406 08/17/17 0449  WBC 5.7 4.0 4.6  NEUTROABS 3.2  --   --   HGB 13.4 10.7* 10.5*  HCT 41.1 33.6* 33.1*  MCV 88.6 89.8 90.7  PLT 170 137* 138*   Cardiac Enzymes:  Recent Labs Lab 08/15/17 1130 08/15/17 1632 08/15/17 2307  TROPONINI 0.04* 0.03* 0.03*   BNP: Invalid input(s): POCBNP CBG: No results for input(s): GLUCAP in the last 168 hours.  Time coordinating discharge:  Greater than 30 minutes  Signed:  Dominic Rhome, DO [facility] Pager: [phone] 08/19/2017, 7:48 AM

## 2017-08-18 NOTE — Telephone Encounter (Signed)
PT NEED EGD/DIL W/ RMR PT, Dx: DYSPHAGIA.

## 2017-08-19 ENCOUNTER — Other Ambulatory Visit: Payer: Self-pay

## 2017-08-19 ENCOUNTER — Encounter (HOSPITAL_COMMUNITY): Payer: Self-pay | Admitting: Gastroenterology

## 2017-08-19 DIAGNOSIS — Z658 Other specified problems related to psychosocial circumstances: Secondary | ICD-10-CM | POA: Diagnosis not present

## 2017-08-19 DIAGNOSIS — D696 Thrombocytopenia, unspecified: Secondary | ICD-10-CM | POA: Diagnosis not present

## 2017-08-19 DIAGNOSIS — N183 Chronic kidney disease, stage 3 (moderate): Secondary | ICD-10-CM | POA: Diagnosis not present

## 2017-08-19 DIAGNOSIS — R112 Nausea with vomiting, unspecified: Secondary | ICD-10-CM | POA: Diagnosis not present

## 2017-08-19 DIAGNOSIS — M6281 Muscle weakness (generalized): Secondary | ICD-10-CM | POA: Diagnosis not present

## 2017-08-19 DIAGNOSIS — K222 Esophageal obstruction: Secondary | ICD-10-CM | POA: Diagnosis not present

## 2017-08-19 DIAGNOSIS — F4323 Adjustment disorder with mixed anxiety and depressed mood: Secondary | ICD-10-CM | POA: Diagnosis not present

## 2017-08-19 DIAGNOSIS — R748 Abnormal levels of other serum enzymes: Secondary | ICD-10-CM | POA: Diagnosis not present

## 2017-08-19 DIAGNOSIS — K7030 Alcoholic cirrhosis of liver without ascites: Secondary | ICD-10-CM | POA: Diagnosis not present

## 2017-08-19 DIAGNOSIS — I4891 Unspecified atrial fibrillation: Secondary | ICD-10-CM | POA: Diagnosis not present

## 2017-08-19 DIAGNOSIS — M109 Gout, unspecified: Secondary | ICD-10-CM | POA: Diagnosis not present

## 2017-08-19 DIAGNOSIS — I959 Hypotension, unspecified: Secondary | ICD-10-CM | POA: Diagnosis not present

## 2017-08-19 DIAGNOSIS — R29898 Other symptoms and signs involving the musculoskeletal system: Secondary | ICD-10-CM | POA: Diagnosis not present

## 2017-08-19 DIAGNOSIS — H54413A Blindness right eye category 3, normal vision left eye: Secondary | ICD-10-CM | POA: Diagnosis not present

## 2017-08-19 DIAGNOSIS — N184 Chronic kidney disease, stage 4 (severe): Secondary | ICD-10-CM | POA: Diagnosis not present

## 2017-08-19 DIAGNOSIS — E860 Dehydration: Secondary | ICD-10-CM | POA: Diagnosis not present

## 2017-08-19 DIAGNOSIS — I10 Essential (primary) hypertension: Secondary | ICD-10-CM | POA: Diagnosis not present

## 2017-08-19 DIAGNOSIS — K219 Gastro-esophageal reflux disease without esophagitis: Secondary | ICD-10-CM | POA: Diagnosis not present

## 2017-08-19 DIAGNOSIS — K119 Disease of salivary gland, unspecified: Secondary | ICD-10-CM

## 2017-08-19 DIAGNOSIS — R1314 Dysphagia, pharyngoesophageal phase: Secondary | ICD-10-CM | POA: Diagnosis not present

## 2017-08-19 DIAGNOSIS — I5032 Chronic diastolic (congestive) heart failure: Secondary | ICD-10-CM | POA: Diagnosis not present

## 2017-08-19 DIAGNOSIS — K311 Adult hypertrophic pyloric stenosis: Secondary | ICD-10-CM | POA: Diagnosis not present

## 2017-08-19 DIAGNOSIS — R279 Unspecified lack of coordination: Secondary | ICD-10-CM | POA: Diagnosis not present

## 2017-08-19 DIAGNOSIS — J9611 Chronic respiratory failure with hypoxia: Secondary | ICD-10-CM | POA: Diagnosis not present

## 2017-08-19 DIAGNOSIS — E039 Hypothyroidism, unspecified: Secondary | ICD-10-CM | POA: Diagnosis not present

## 2017-08-19 DIAGNOSIS — Z23 Encounter for immunization: Secondary | ICD-10-CM | POA: Diagnosis not present

## 2017-08-19 DIAGNOSIS — R1311 Dysphagia, oral phase: Secondary | ICD-10-CM | POA: Diagnosis not present

## 2017-08-19 DIAGNOSIS — R262 Difficulty in walking, not elsewhere classified: Secondary | ICD-10-CM | POA: Diagnosis not present

## 2017-08-19 DIAGNOSIS — K3184 Gastroparesis: Secondary | ICD-10-CM | POA: Diagnosis not present

## 2017-08-19 DIAGNOSIS — K110 Atrophy of salivary gland: Secondary | ICD-10-CM | POA: Diagnosis not present

## 2017-08-19 DIAGNOSIS — I9589 Other hypotension: Secondary | ICD-10-CM | POA: Diagnosis not present

## 2017-08-19 DIAGNOSIS — Z7401 Bed confinement status: Secondary | ICD-10-CM | POA: Diagnosis not present

## 2017-08-19 DIAGNOSIS — K5909 Other constipation: Secondary | ICD-10-CM | POA: Diagnosis not present

## 2017-08-19 DIAGNOSIS — N179 Acute kidney failure, unspecified: Secondary | ICD-10-CM | POA: Diagnosis not present

## 2017-08-19 DIAGNOSIS — E861 Hypovolemia: Secondary | ICD-10-CM

## 2017-08-19 DIAGNOSIS — I482 Chronic atrial fibrillation: Secondary | ICD-10-CM | POA: Diagnosis not present

## 2017-08-19 DIAGNOSIS — Z7901 Long term (current) use of anticoagulants: Secondary | ICD-10-CM | POA: Diagnosis not present

## 2017-08-19 DIAGNOSIS — M545 Low back pain: Secondary | ICD-10-CM | POA: Diagnosis not present

## 2017-08-19 LAB — BASIC METABOLIC PANEL
ANION GAP: 7 (ref 5–15)
BUN: 33 mg/dL — ABNORMAL HIGH (ref 6–20)
CO2: 28 mmol/L (ref 22–32)
Calcium: 8.9 mg/dL (ref 8.9–10.3)
Chloride: 102 mmol/L (ref 101–111)
Creatinine, Ser: 1.85 mg/dL — ABNORMAL HIGH (ref 0.61–1.24)
GFR calc Af Amer: 39 mL/min — ABNORMAL LOW (ref >60)
GFR, EST NON AFRICAN AMERICAN: 33 mL/min — AB (ref >60)
Glucose, Bld: 118 mg/dL — ABNORMAL HIGH (ref 65–99)
POTASSIUM: 4.3 mmol/L (ref 3.5–5.1)
SODIUM: 137 mmol/L (ref 135–145)

## 2017-08-19 MED ORDER — PREDNISONE 50 MG PO TABS: 50.0000 mg | ORAL_TABLET | Freq: Every day | ORAL | 0 refills | Status: DC

## 2017-08-19 MED ORDER — ALLOPURINOL 100 MG PO TABS
100.0000 mg | ORAL_TABLET | Freq: Every day | ORAL | Status: DC
  Administered 2017-08-19: 100 mg via ORAL
  Filled 2017-08-19: qty 1

## 2017-08-19 MED ORDER — ALLOPURINOL 100 MG PO TABS
100.0000 mg | ORAL_TABLET | Freq: Every day | ORAL | 1 refills | Status: DC
Start: 2017-08-19 — End: 2019-11-18

## 2017-08-19 MED ORDER — PREDNISONE 20 MG PO TABS
50.0000 mg | ORAL_TABLET | Freq: Every day | ORAL | Status: DC
  Administered 2017-08-19: 50 mg via ORAL
  Filled 2017-08-19: qty 2

## 2017-08-19 NOTE — Clinical Social Work Note (Signed)
Clinical Social Work Assessment  Patient Details  Name: Justin Parsons MRN: [id number] Date of Birth: Jun 24, 1941  Date of referral:  08/19/17               Reason for consult:  Discharge Planning                Permission sought to share information with:    Permission granted to share information::     Name::        Agency::  Gerald Stabs, at [facility]   Relationship::     Contact Information:     Housing/Transportation Living arrangements for the past 2 months:  [place] of Information:  Patient Patient Interpreter Needed:  None Criminal Activity/Legal Involvement Pertinent to Current Situation/Hospitalization:  No - Comment as needed Significant Relationships:  Other Family Members Lives with:  Facility Resident Do you feel safe going back to the place where you live?  Yes Need for family participation in patient care:  No (Coment)  Care giving concerns:  No care giving concerns identified.    Social Worker assessment / plan:  Patient had been at [facility] for 2-3 days prior to hospital admission. Prior to that he was at [facility]. Patient was not pleased at [facility] and transferred to [facility]. Patient is placed for short term rehab. Patient return at discharge. Patient ambulates with a rolling walker and receives some assistance with some ADLs.    Employment status:  Retired Forensic scientist:  Medicare PT Recommendations:  Not assessed at this time, [place] / Referral to community resources:     Patient/Family's Response to care:  Patient is agreeable to return to [facility].   Patient/Family's Understanding of and Emotional Response to Diagnosis, Current Treatment, and Prognosis:  Patient understands his diagnosis, treatment and prognosis.   Emotional Assessment Appearance:  Appears stated age Attitude/Demeanor/Rapport:    Affect (typically observed):  Unable to Assess Orientation:  Oriented to  Self, Oriented to Place, Oriented to  Time, Oriented to Situation Alcohol / Substance use:    Psych involvement (Current and /or in the community):  No (Comment)  Discharge Needs  Concerns to be addressed:  No discharge needs identified Readmission within the last 30 days:  No Current discharge risk:  None Barriers to Discharge:  No Barriers Identified   Ihor Gully, LCSW 08/19/2017, 9:37 AM

## 2017-08-19 NOTE — Clinical Social Work Note (Signed)
LCSW notified Gerald Stabs at [facility] and [place], relative, of patient's discharge and that he would be transported by [facility] back to [facility].  Clinicals were sent via [employer] and transport arranged with [facility].   LCSW signing off.     Catalyna Reilly, Clydene Pugh, LCSW

## 2017-08-19 NOTE — Progress Notes (Signed)
Report called to [facility].

## 2017-08-19 NOTE — Care Management Important Message (Signed)
Important Message  Patient Details  Name: Justin Parsons MRN: [id number] Date of Birth: 01-19-1941   Medicare Important Message Given:  Yes    Sherald Barge, RN 08/19/2017, 9:13 AM

## 2017-08-19 NOTE — Progress Notes (Signed)
Nutrition Follow-up  DOCUMENTATION CODES:   Severe malnutrition in context of chronic illness  INTERVENTION:  Ensure Enlive po TID, between meals due to poor oral, intake, early satiety and malnutrition  C/o dry mouth and loss of taste acuity - MD please address. RD provided suggestions to improve intake such as adding gravy to foods for moisture, eating fruits canned in juice and melon-type   Provide 6 small meals daily or liquid supplments (ensure will suffice as a mini meal)  Add MVI daily   NUTRITION DIAGNOSIS:   Malnutrition (severe) related to chronic illness (liver disease, poor oral intake-early satiety, mouth pain-dryness and taste changes) as evidenced by percent weight loss 16% in 3 months based on wt on  9/27-of  234#. He was dehydrated on admission.    GOAL:   Patient will meet greater than or equal to 90% of their needs   MONITOR:   Supplement acceptance, PO intake, Labs, Weight trends  REASON FOR ASSESSMENT:   Malnutrition Screening Tool/ Consulted to assess pt status and recommend supplements    ASSESSMENT:  The patient presents with c/o nausea, vomiting and wt loss. He has a hx of cirrhosis, esophageal varices, swallow problem, CKD-3, CAD, and TIA. He is blind in the right eye. 9/27 US abdomen- no acute results-sludge in gallbladder 9/29 EGD/possible pyloric dialtion-findings- 9/28- Gastric emptying study- findings delayed emptying  Patient says his has been eating very little the past several months due to nausea and vomiting with food.  His intake is still poor this morning-foodwise but he has drank well- 100% of Ensure Enlive, apple juice, tea. 1-2 bites of oatmeal. Tolerated well and no c/o nausea this morning. The patient complains of dry mouth and taste changes and says his mouth "skin peel off" and makes it difficult to eat. Spent time with him educating on the importance of frequent small meals and offer ideas to add moisture to his foods.   Severe  wt loss of 16% in 3 months due to lack of appetite. Increased weakness, per pt. He had been working some with the therapy at [facility].   Nutrition-Focused physical exam completed. Findings are moderate orbital upper arms fat depletion, severe clavicle and acormion muscle depletion moderate loss to  Anterior thigh, scapula and posterior calf, and mild edema to bilateral lower extremities and hands.   Labs and meds reviewed.  Diet Order:  DIET DYS 2 Room service appropriate? Yes; Fluid consistency: Thin Diet - low sodium heart healthy  Skin:  Reviewed, no issues  Last BM:  9/26  Height:   Ht Readings from Last 1 Encounters:  08/15/17 5' 9"  (1.753 m)    Weight:   Wt Readings from Last 1 Encounters:  08/17/17 245 lb 2.4 oz (111.2 kg)    Ideal Body Weight:  73 kg  BMI:  Body mass index is 36.2 kg/m.  Estimated Nutritional Needs:   Kcal:  4142-3953  Protein:  70-80 gr  Fluid:  1.9-2.2 liters daily  EDUCATION NEEDS:   Education needs addressed (Cirrhosis Nutrition Therapy provided. Pt in denial about disease.)  Colman Cater MS,RD,CSG,LDN Office: [phone] Pager: [phone]

## 2017-08-19 NOTE — Telephone Encounter (Signed)
Referral info faxed to [facility].

## 2017-08-19 NOTE — Telephone Encounter (Signed)
Dr. Gala Romney, do you want pt to have office visit before scheduling EGD/DIL or is it ok to schedule procedure?

## 2017-08-20 ENCOUNTER — Telehealth: Payer: Self-pay | Admitting: Internal Medicine

## 2017-08-20 ENCOUNTER — Encounter: Payer: Self-pay | Admitting: Internal Medicine

## 2017-08-20 LAB — CULTURE, BLOOD (ROUTINE X 2)
CULTURE: NO GROWTH
CULTURE: NO GROWTH
SPECIAL REQUESTS: ADEQUATE
Special Requests: ADEQUATE

## 2017-08-20 NOTE — Telephone Encounter (Signed)
Routing to [place] to schedule OV.

## 2017-08-20 NOTE — Telephone Encounter (Signed)
PATIENT SCHEDULED  °

## 2017-08-20 NOTE — Telephone Encounter (Signed)
CALLED PATIENT TO TELL HIM THAT HIS APPOINTMENT HAS BEEN MOVED TO A SOONER APPOINTMENT AND PATIENT STATED THAT HE DID NOT HAVE AN APPOINTMENT HERE THAT IT WAS HIS FIRST COUSIN WITH THE SAME NAME THAT WAS HERE LAST WEEK.  COULD NOT UNDERSTAND WHAT THE PATIENT WAS TELLING ME.

## 2017-08-20 NOTE — Telephone Encounter (Signed)
Letter has been mailed to the patient in regards to his upcoming appointment

## 2017-08-20 NOTE — Telephone Encounter (Signed)
Patient needs  an office visit in one month prior to scheduling repeat EGD.

## 2017-08-21 ENCOUNTER — Ambulatory Visit (HOSPITAL_COMMUNITY): Payer: Medicare Other

## 2017-08-21 ENCOUNTER — Telehealth: Payer: Self-pay | Admitting: Internal Medicine

## 2017-08-21 NOTE — Telephone Encounter (Signed)
Communication noted. I would have patient simply follow-up with PCP and he can go from there.

## 2017-08-21 NOTE — Telephone Encounter (Signed)
[facility] called to say that we had sent a referral to them on this patient. He said that he doesn't have transportation and the [facility] in [place] charges to bring him. Patient said he was not going.

## 2017-08-21 NOTE — Telephone Encounter (Signed)
Noted, canceled order.

## 2017-08-22 NOTE — Telephone Encounter (Signed)
Noted  

## 2017-08-23 DIAGNOSIS — N183 Chronic kidney disease, stage 3 (moderate): Secondary | ICD-10-CM | POA: Diagnosis not present

## 2017-08-23 DIAGNOSIS — I5032 Chronic diastolic (congestive) heart failure: Secondary | ICD-10-CM | POA: Diagnosis not present

## 2017-08-23 DIAGNOSIS — I4891 Unspecified atrial fibrillation: Secondary | ICD-10-CM | POA: Diagnosis not present

## 2017-08-23 DIAGNOSIS — K7030 Alcoholic cirrhosis of liver without ascites: Secondary | ICD-10-CM | POA: Diagnosis not present

## 2017-08-26 DIAGNOSIS — K5909 Other constipation: Secondary | ICD-10-CM | POA: Diagnosis not present

## 2017-08-26 DIAGNOSIS — K222 Esophageal obstruction: Secondary | ICD-10-CM | POA: Diagnosis not present

## 2017-08-26 DIAGNOSIS — R29898 Other symptoms and signs involving the musculoskeletal system: Secondary | ICD-10-CM | POA: Diagnosis not present

## 2017-09-02 DIAGNOSIS — I5032 Chronic diastolic (congestive) heart failure: Secondary | ICD-10-CM | POA: Diagnosis not present

## 2017-09-02 DIAGNOSIS — E039 Hypothyroidism, unspecified: Secondary | ICD-10-CM | POA: Diagnosis not present

## 2017-09-02 DIAGNOSIS — M109 Gout, unspecified: Secondary | ICD-10-CM | POA: Diagnosis not present

## 2017-09-02 DIAGNOSIS — I4891 Unspecified atrial fibrillation: Secondary | ICD-10-CM | POA: Diagnosis not present

## 2017-09-09 DIAGNOSIS — I959 Hypotension, unspecified: Secondary | ICD-10-CM | POA: Diagnosis not present

## 2017-09-09 DIAGNOSIS — R1311 Dysphagia, oral phase: Secondary | ICD-10-CM | POA: Diagnosis not present

## 2017-09-09 DIAGNOSIS — I252 Old myocardial infarction: Secondary | ICD-10-CM | POA: Diagnosis not present

## 2017-09-09 DIAGNOSIS — K7030 Alcoholic cirrhosis of liver without ascites: Secondary | ICD-10-CM | POA: Diagnosis not present

## 2017-09-09 DIAGNOSIS — Z9981 Dependence on supplemental oxygen: Secondary | ICD-10-CM | POA: Diagnosis not present

## 2017-09-09 DIAGNOSIS — M1990 Unspecified osteoarthritis, unspecified site: Secondary | ICD-10-CM | POA: Diagnosis not present

## 2017-09-09 DIAGNOSIS — I482 Chronic atrial fibrillation: Secondary | ICD-10-CM | POA: Diagnosis not present

## 2017-09-09 DIAGNOSIS — N183 Chronic kidney disease, stage 3 (moderate): Secondary | ICD-10-CM | POA: Diagnosis not present

## 2017-09-09 DIAGNOSIS — I5032 Chronic diastolic (congestive) heart failure: Secondary | ICD-10-CM | POA: Diagnosis not present

## 2017-09-09 DIAGNOSIS — M109 Gout, unspecified: Secondary | ICD-10-CM | POA: Diagnosis not present

## 2017-09-09 DIAGNOSIS — Z96653 Presence of artificial knee joint, bilateral: Secondary | ICD-10-CM | POA: Diagnosis not present

## 2017-09-09 DIAGNOSIS — K222 Esophageal obstruction: Secondary | ICD-10-CM | POA: Diagnosis not present

## 2017-09-09 DIAGNOSIS — Z96643 Presence of artificial hip joint, bilateral: Secondary | ICD-10-CM | POA: Diagnosis not present

## 2017-09-09 DIAGNOSIS — Z7982 Long term (current) use of aspirin: Secondary | ICD-10-CM | POA: Diagnosis not present

## 2017-09-09 DIAGNOSIS — Z959 Presence of cardiac and vascular implant and graft, unspecified: Secondary | ICD-10-CM | POA: Diagnosis not present

## 2017-09-09 DIAGNOSIS — Z7902 Long term (current) use of antithrombotics/antiplatelets: Secondary | ICD-10-CM | POA: Diagnosis not present

## 2017-09-09 DIAGNOSIS — I2510 Atherosclerotic heart disease of native coronary artery without angina pectoris: Secondary | ICD-10-CM | POA: Diagnosis not present

## 2017-09-10 DIAGNOSIS — K222 Esophageal obstruction: Secondary | ICD-10-CM | POA: Diagnosis not present

## 2017-09-10 DIAGNOSIS — R1311 Dysphagia, oral phase: Secondary | ICD-10-CM | POA: Diagnosis not present

## 2017-09-10 DIAGNOSIS — I959 Hypotension, unspecified: Secondary | ICD-10-CM | POA: Diagnosis not present

## 2017-09-10 DIAGNOSIS — I5032 Chronic diastolic (congestive) heart failure: Secondary | ICD-10-CM | POA: Diagnosis not present

## 2017-09-10 DIAGNOSIS — M1990 Unspecified osteoarthritis, unspecified site: Secondary | ICD-10-CM | POA: Diagnosis not present

## 2017-09-10 DIAGNOSIS — M109 Gout, unspecified: Secondary | ICD-10-CM | POA: Diagnosis not present

## 2017-09-11 DIAGNOSIS — M1990 Unspecified osteoarthritis, unspecified site: Secondary | ICD-10-CM | POA: Diagnosis not present

## 2017-09-11 DIAGNOSIS — K222 Esophageal obstruction: Secondary | ICD-10-CM | POA: Diagnosis not present

## 2017-09-11 DIAGNOSIS — R1311 Dysphagia, oral phase: Secondary | ICD-10-CM | POA: Diagnosis not present

## 2017-09-11 DIAGNOSIS — I5032 Chronic diastolic (congestive) heart failure: Secondary | ICD-10-CM | POA: Diagnosis not present

## 2017-09-11 DIAGNOSIS — I959 Hypotension, unspecified: Secondary | ICD-10-CM | POA: Diagnosis not present

## 2017-09-11 DIAGNOSIS — M109 Gout, unspecified: Secondary | ICD-10-CM | POA: Diagnosis not present

## 2017-09-12 DIAGNOSIS — N184 Chronic kidney disease, stage 4 (severe): Secondary | ICD-10-CM | POA: Diagnosis not present

## 2017-09-12 DIAGNOSIS — R1110 Vomiting, unspecified: Secondary | ICD-10-CM | POA: Diagnosis not present

## 2017-09-12 DIAGNOSIS — I10 Essential (primary) hypertension: Secondary | ICD-10-CM | POA: Diagnosis not present

## 2017-09-12 DIAGNOSIS — Z299 Encounter for prophylactic measures, unspecified: Secondary | ICD-10-CM | POA: Diagnosis not present

## 2017-09-12 DIAGNOSIS — Z6841 Body Mass Index (BMI) 40.0 and over, adult: Secondary | ICD-10-CM | POA: Diagnosis not present

## 2017-09-12 DIAGNOSIS — M109 Gout, unspecified: Secondary | ICD-10-CM | POA: Diagnosis not present

## 2017-09-12 DIAGNOSIS — I509 Heart failure, unspecified: Secondary | ICD-10-CM | POA: Diagnosis not present

## 2017-09-12 DIAGNOSIS — I4891 Unspecified atrial fibrillation: Secondary | ICD-10-CM | POA: Diagnosis not present

## 2017-09-12 DIAGNOSIS — K7030 Alcoholic cirrhosis of liver without ascites: Secondary | ICD-10-CM | POA: Diagnosis not present

## 2017-09-16 DIAGNOSIS — I959 Hypotension, unspecified: Secondary | ICD-10-CM | POA: Diagnosis not present

## 2017-09-16 DIAGNOSIS — I5032 Chronic diastolic (congestive) heart failure: Secondary | ICD-10-CM | POA: Diagnosis not present

## 2017-09-16 DIAGNOSIS — K222 Esophageal obstruction: Secondary | ICD-10-CM | POA: Diagnosis not present

## 2017-09-16 DIAGNOSIS — M109 Gout, unspecified: Secondary | ICD-10-CM | POA: Diagnosis not present

## 2017-09-16 DIAGNOSIS — R1311 Dysphagia, oral phase: Secondary | ICD-10-CM | POA: Diagnosis not present

## 2017-09-16 DIAGNOSIS — M1990 Unspecified osteoarthritis, unspecified site: Secondary | ICD-10-CM | POA: Diagnosis not present

## 2017-09-17 DIAGNOSIS — R1311 Dysphagia, oral phase: Secondary | ICD-10-CM | POA: Diagnosis not present

## 2017-09-17 DIAGNOSIS — I5032 Chronic diastolic (congestive) heart failure: Secondary | ICD-10-CM | POA: Diagnosis not present

## 2017-09-17 DIAGNOSIS — K222 Esophageal obstruction: Secondary | ICD-10-CM | POA: Diagnosis not present

## 2017-09-17 DIAGNOSIS — M1990 Unspecified osteoarthritis, unspecified site: Secondary | ICD-10-CM | POA: Diagnosis not present

## 2017-09-17 DIAGNOSIS — M109 Gout, unspecified: Secondary | ICD-10-CM | POA: Diagnosis not present

## 2017-09-17 DIAGNOSIS — I959 Hypotension, unspecified: Secondary | ICD-10-CM | POA: Diagnosis not present

## 2017-09-18 ENCOUNTER — Telehealth: Payer: Self-pay | Admitting: Internal Medicine

## 2017-09-18 DIAGNOSIS — R1311 Dysphagia, oral phase: Secondary | ICD-10-CM | POA: Diagnosis not present

## 2017-09-18 DIAGNOSIS — I5032 Chronic diastolic (congestive) heart failure: Secondary | ICD-10-CM | POA: Diagnosis not present

## 2017-09-18 DIAGNOSIS — K222 Esophageal obstruction: Secondary | ICD-10-CM | POA: Diagnosis not present

## 2017-09-18 DIAGNOSIS — I959 Hypotension, unspecified: Secondary | ICD-10-CM | POA: Diagnosis not present

## 2017-09-18 DIAGNOSIS — M1990 Unspecified osteoarthritis, unspecified site: Secondary | ICD-10-CM | POA: Diagnosis not present

## 2017-09-18 DIAGNOSIS — M109 Gout, unspecified: Secondary | ICD-10-CM | POA: Diagnosis not present

## 2017-09-18 NOTE — Telephone Encounter (Signed)
Pt called saying he couldn't keep anything down and keeps vomiting. He was wanting to speak with EG. I told him that the providers were at lunch and I could send a message to the nurse.  Pt has OV here on Friday to see EG, but patient said he felt like he was going to pass out and was sweating. I told him to go to the ER and there's GI coverage there. [phone]

## 2017-09-18 NOTE — Telephone Encounter (Signed)
FYI:Pt has apt this Friday.  I called the number listed with this note and pts friend Joe answered the phone saying pt was laying down and didn't want to go to the doctors at this time. Joe notified that pt should call back if needed.

## 2017-09-19 DIAGNOSIS — R1311 Dysphagia, oral phase: Secondary | ICD-10-CM | POA: Diagnosis not present

## 2017-09-19 DIAGNOSIS — I5032 Chronic diastolic (congestive) heart failure: Secondary | ICD-10-CM | POA: Diagnosis not present

## 2017-09-19 DIAGNOSIS — M109 Gout, unspecified: Secondary | ICD-10-CM | POA: Diagnosis not present

## 2017-09-19 DIAGNOSIS — M1990 Unspecified osteoarthritis, unspecified site: Secondary | ICD-10-CM | POA: Diagnosis not present

## 2017-09-19 DIAGNOSIS — I959 Hypotension, unspecified: Secondary | ICD-10-CM | POA: Diagnosis not present

## 2017-09-19 DIAGNOSIS — K222 Esophageal obstruction: Secondary | ICD-10-CM | POA: Diagnosis not present

## 2017-09-20 ENCOUNTER — Ambulatory Visit (INDEPENDENT_AMBULATORY_CARE_PROVIDER_SITE_OTHER): Payer: Medicare Other | Admitting: Nurse Practitioner

## 2017-09-20 ENCOUNTER — Other Ambulatory Visit: Payer: Self-pay

## 2017-09-20 ENCOUNTER — Encounter: Payer: Self-pay | Admitting: Nurse Practitioner

## 2017-09-20 ENCOUNTER — Encounter (HOSPITAL_COMMUNITY): Payer: Self-pay | Admitting: Emergency Medicine

## 2017-09-20 ENCOUNTER — Inpatient Hospital Stay (HOSPITAL_COMMUNITY): Payer: Medicare Other

## 2017-09-20 ENCOUNTER — Inpatient Hospital Stay (HOSPITAL_COMMUNITY)
Admission: EM | Admit: 2017-09-20 | Discharge: 2017-09-24 | DRG: 392 | Disposition: A | Discharge status: Home Health | Payer: Medicare Other | Attending: Internal Medicine | Admitting: Internal Medicine

## 2017-09-20 ENCOUNTER — Emergency Department (HOSPITAL_COMMUNITY): Payer: Medicare Other

## 2017-09-20 VITALS — BP 110/82 | HR 68 | Temp 97.2°F

## 2017-09-20 DIAGNOSIS — R112 Nausea with vomiting, unspecified: Secondary | ICD-10-CM

## 2017-09-20 DIAGNOSIS — E039 Hypothyroidism, unspecified: Secondary | ICD-10-CM | POA: Diagnosis present

## 2017-09-20 DIAGNOSIS — Z96643 Presence of artificial hip joint, bilateral: Secondary | ICD-10-CM | POA: Diagnosis present

## 2017-09-20 DIAGNOSIS — I639 Cerebral infarction, unspecified: Secondary | ICD-10-CM

## 2017-09-20 DIAGNOSIS — I482 Chronic atrial fibrillation: Secondary | ICD-10-CM | POA: Diagnosis present

## 2017-09-20 DIAGNOSIS — R531 Weakness: Secondary | ICD-10-CM | POA: Diagnosis present

## 2017-09-20 DIAGNOSIS — R1111 Vomiting without nausea: Secondary | ICD-10-CM | POA: Diagnosis not present

## 2017-09-20 DIAGNOSIS — Z8673 Personal history of transient ischemic attack (TIA), and cerebral infarction without residual deficits: Secondary | ICD-10-CM

## 2017-09-20 DIAGNOSIS — Z87891 Personal history of nicotine dependence: Secondary | ICD-10-CM

## 2017-09-20 DIAGNOSIS — K311 Adult hypertrophic pyloric stenosis: Secondary | ICD-10-CM | POA: Diagnosis present

## 2017-09-20 DIAGNOSIS — K219 Gastro-esophageal reflux disease without esophagitis: Secondary | ICD-10-CM | POA: Diagnosis present

## 2017-09-20 DIAGNOSIS — N183 Chronic kidney disease, stage 3 unspecified: Secondary | ICD-10-CM | POA: Diagnosis present

## 2017-09-20 DIAGNOSIS — I4891 Unspecified atrial fibrillation: Secondary | ICD-10-CM | POA: Diagnosis not present

## 2017-09-20 DIAGNOSIS — I129 Hypertensive chronic kidney disease with stage 1 through stage 4 chronic kidney disease, or unspecified chronic kidney disease: Secondary | ICD-10-CM | POA: Diagnosis present

## 2017-09-20 DIAGNOSIS — E860 Dehydration: Secondary | ICD-10-CM | POA: Diagnosis not present

## 2017-09-20 DIAGNOSIS — N179 Acute kidney failure, unspecified: Secondary | ICD-10-CM | POA: Diagnosis present

## 2017-09-20 DIAGNOSIS — K210 Gastro-esophageal reflux disease with esophagitis: Secondary | ICD-10-CM | POA: Diagnosis present

## 2017-09-20 DIAGNOSIS — I10 Essential (primary) hypertension: Secondary | ICD-10-CM | POA: Diagnosis not present

## 2017-09-20 DIAGNOSIS — K7030 Alcoholic cirrhosis of liver without ascites: Secondary | ICD-10-CM | POA: Diagnosis present

## 2017-09-20 DIAGNOSIS — K222 Esophageal obstruction: Principal | ICD-10-CM | POA: Diagnosis present

## 2017-09-20 DIAGNOSIS — R1110 Vomiting, unspecified: Secondary | ICD-10-CM | POA: Diagnosis present

## 2017-09-20 DIAGNOSIS — K5900 Constipation, unspecified: Secondary | ICD-10-CM | POA: Diagnosis not present

## 2017-09-20 DIAGNOSIS — Z96653 Presence of artificial knee joint, bilateral: Secondary | ICD-10-CM | POA: Diagnosis present

## 2017-09-20 DIAGNOSIS — Z8719 Personal history of other diseases of the digestive system: Secondary | ICD-10-CM | POA: Diagnosis not present

## 2017-09-20 DIAGNOSIS — I2510 Atherosclerotic heart disease of native coronary artery without angina pectoris: Secondary | ICD-10-CM | POA: Diagnosis present

## 2017-09-20 DIAGNOSIS — K2970 Gastritis, unspecified, without bleeding: Secondary | ICD-10-CM | POA: Diagnosis not present

## 2017-09-20 DIAGNOSIS — Z955 Presence of coronary angioplasty implant and graft: Secondary | ICD-10-CM | POA: Diagnosis not present

## 2017-09-20 DIAGNOSIS — I959 Hypotension, unspecified: Secondary | ICD-10-CM | POA: Diagnosis not present

## 2017-09-20 DIAGNOSIS — I480 Paroxysmal atrial fibrillation: Secondary | ICD-10-CM | POA: Diagnosis not present

## 2017-09-20 DIAGNOSIS — R1310 Dysphagia, unspecified: Secondary | ICD-10-CM | POA: Diagnosis not present

## 2017-09-20 DIAGNOSIS — Z79899 Other long term (current) drug therapy: Secondary | ICD-10-CM

## 2017-09-20 DIAGNOSIS — H5461 Unqualified visual loss, right eye, normal vision left eye: Secondary | ICD-10-CM | POA: Diagnosis present

## 2017-09-20 DIAGNOSIS — K7460 Unspecified cirrhosis of liver: Secondary | ICD-10-CM | POA: Diagnosis present

## 2017-09-20 DIAGNOSIS — Z7982 Long term (current) use of aspirin: Secondary | ICD-10-CM | POA: Diagnosis not present

## 2017-09-20 LAB — CBC WITH DIFFERENTIAL/PLATELET
BASOS ABS: 0 10*3/uL (ref 0.0–0.1)
BASOS PCT: 0 %
EOS ABS: 0.2 10*3/uL (ref 0.0–0.7)
EOS PCT: 2 %
HCT: 43.4 % (ref 39.0–52.0)
Hemoglobin: 13.9 g/dL (ref 13.0–17.0)
LYMPHS PCT: 23 %
Lymphs Abs: 2.9 10*3/uL (ref 0.7–4.0)
MCH: 28.2 pg (ref 26.0–34.0)
MCHC: 32 g/dL (ref 30.0–36.0)
MCV: 88 fL (ref 78.0–100.0)
Monocytes Absolute: 0.9 10*3/uL (ref 0.1–1.0)
Monocytes Relative: 8 %
Neutro Abs: 8.3 10*3/uL — ABNORMAL HIGH (ref 1.7–7.7)
Neutrophils Relative %: 67 %
PLATELETS: 179 10*3/uL (ref 150–400)
RBC: 4.93 MIL/uL (ref 4.22–5.81)
RDW: 19 % — AB (ref 11.5–15.5)
WBC: 12.3 10*3/uL — ABNORMAL HIGH (ref 4.0–10.5)

## 2017-09-20 LAB — COMPREHENSIVE METABOLIC PANEL
ALBUMIN: 3.4 g/dL — AB (ref 3.5–5.0)
ALT: 15 U/L — AB (ref 17–63)
AST: 32 U/L (ref 15–41)
Alkaline Phosphatase: 95 U/L (ref 38–126)
Anion gap: 19 — ABNORMAL HIGH (ref 5–15)
BUN: 44 mg/dL — AB (ref 6–20)
CHLORIDE: 94 mmol/L — AB (ref 101–111)
CO2: 23 mmol/L (ref 22–32)
CREATININE: 3.89 mg/dL — AB (ref 0.61–1.24)
Calcium: 9.9 mg/dL (ref 8.9–10.3)
GFR calc Af Amer: 16 mL/min — ABNORMAL LOW (ref >60)
GFR, EST NON AFRICAN AMERICAN: 14 mL/min — AB (ref >60)
GLUCOSE: 116 mg/dL — AB (ref 65–99)
POTASSIUM: 4 mmol/L (ref 3.5–5.1)
Sodium: 136 mmol/L (ref 135–145)
Total Bilirubin: 1.4 mg/dL — ABNORMAL HIGH (ref 0.3–1.2)
Total Protein: 6.8 g/dL (ref 6.5–8.1)

## 2017-09-20 LAB — PROTIME-INR
INR: 1.08
PROTHROMBIN TIME: 13.9 s (ref 11.4–15.2)

## 2017-09-20 LAB — TSH: TSH: 2.16 u[IU]/mL (ref 0.350–4.500)

## 2017-09-20 LAB — URINALYSIS, ROUTINE W REFLEX MICROSCOPIC
BILIRUBIN URINE: NEGATIVE
GLUCOSE, UA: NEGATIVE mg/dL
Hgb urine dipstick: NEGATIVE
Ketones, ur: 5 mg/dL — AB
NITRITE: NEGATIVE
PH: 5 (ref 5.0–8.0)
Protein, ur: 30 mg/dL — AB
SPECIFIC GRAVITY, URINE: 1.016 (ref 1.005–1.030)

## 2017-09-20 LAB — CG4 I-STAT (LACTIC ACID)
LACTIC ACID, VENOUS: 2.02 mmol/L — AB (ref 0.5–1.9)
Lactic Acid, Venous: 4.45 mmol/L (ref 0.5–1.9)

## 2017-09-20 LAB — T4, FREE: FREE T4: 1.38 ng/dL — AB (ref 0.61–1.12)

## 2017-09-20 MED ORDER — SODIUM CHLORIDE 0.9 % IV SOLN
INTRAVENOUS | Status: DC
  Administered 2017-09-20 – 2017-09-23 (×5): via INTRAVENOUS

## 2017-09-20 MED ORDER — ONDANSETRON HCL 4 MG/2ML IJ SOLN
4.0000 mg | Freq: Four times a day (QID) | INTRAMUSCULAR | Status: DC
  Administered 2017-09-20 – 2017-09-24 (×15): 4 mg via INTRAVENOUS
  Filled 2017-09-20 (×15): qty 2

## 2017-09-20 MED ORDER — ATORVASTATIN CALCIUM 40 MG PO TABS
80.0000 mg | ORAL_TABLET | Freq: Every day | ORAL | Status: DC
  Administered 2017-09-20 – 2017-09-23 (×4): 80 mg via ORAL
  Filled 2017-09-20 (×4): qty 2

## 2017-09-20 MED ORDER — ALLOPURINOL 300 MG PO TABS
300.0000 mg | ORAL_TABLET | Freq: Every day | ORAL | Status: DC
  Administered 2017-09-20 – 2017-09-24 (×4): 300 mg via ORAL
  Filled 2017-09-20 (×4): qty 1

## 2017-09-20 MED ORDER — METOPROLOL TARTRATE 5 MG/5ML IV SOLN
5.0000 mg | Freq: Four times a day (QID) | INTRAVENOUS | Status: DC
  Administered 2017-09-20 – 2017-09-21 (×3): 5 mg via INTRAVENOUS
  Filled 2017-09-20 (×3): qty 5

## 2017-09-20 MED ORDER — ONDANSETRON HCL 4 MG/2ML IJ SOLN
4.0000 mg | Freq: Four times a day (QID) | INTRAMUSCULAR | Status: DC | PRN
  Administered 2017-09-21: 4 mg via INTRAVENOUS
  Filled 2017-09-20: qty 2

## 2017-09-20 MED ORDER — ASPIRIN 81 MG PO CHEW
81.0000 mg | CHEWABLE_TABLET | Freq: Every day | ORAL | Status: DC
  Administered 2017-09-20 – 2017-09-24 (×4): 81 mg via ORAL
  Filled 2017-09-20 (×4): qty 1

## 2017-09-20 MED ORDER — CLOPIDOGREL BISULFATE 75 MG PO TABS
75.0000 mg | ORAL_TABLET | Freq: Every day | ORAL | Status: DC
  Administered 2017-09-20 – 2017-09-24 (×4): 75 mg via ORAL
  Filled 2017-09-20 (×4): qty 1

## 2017-09-20 MED ORDER — ACETAMINOPHEN 650 MG RE SUPP: 650.0000 mg | Freq: Four times a day (QID) | RECTAL | Status: DC | PRN

## 2017-09-20 MED ORDER — IOPAMIDOL (ISOVUE-300) INJECTION 61%
INTRAVENOUS | Status: AC
  Filled 2017-09-20: qty 30

## 2017-09-20 MED ORDER — LEVOTHYROXINE SODIUM 50 MCG PO TABS
50.0000 ug | ORAL_TABLET | Freq: Every day | ORAL | Status: DC
  Administered 2017-09-21 – 2017-09-24 (×3): 50 ug via ORAL
  Filled 2017-09-20 (×2): qty 2
  Filled 2017-09-20: qty 1

## 2017-09-20 MED ORDER — ONDANSETRON HCL 4 MG PO TABS: 4.0000 mg | ORAL_TABLET | Freq: Four times a day (QID) | ORAL | Status: DC | PRN

## 2017-09-20 MED ORDER — TRAMADOL HCL 50 MG PO TABS
50.0000 mg | ORAL_TABLET | Freq: Four times a day (QID) | ORAL | Status: DC | PRN
  Administered 2017-09-21 – 2017-09-24 (×3): 50 mg via ORAL
  Filled 2017-09-20 (×3): qty 1

## 2017-09-20 MED ORDER — PANTOPRAZOLE SODIUM 40 MG IV SOLR
40.0000 mg | Freq: Two times a day (BID) | INTRAVENOUS | Status: DC
  Administered 2017-09-20 – 2017-09-23 (×6): 40 mg via INTRAVENOUS
  Filled 2017-09-20 (×6): qty 40

## 2017-09-20 MED ORDER — ACETAMINOPHEN 325 MG PO TABS: 650.0000 mg | ORAL_TABLET | Freq: Four times a day (QID) | ORAL | Status: DC | PRN

## 2017-09-20 MED ORDER — SODIUM CHLORIDE 0.9 % IV BOLUS (SEPSIS)
500.0000 mL | Freq: Once | INTRAVENOUS | Status: AC
  Administered 2017-09-20: 500 mL via INTRAVENOUS

## 2017-09-20 MED ORDER — SODIUM CHLORIDE 0.9 % IV BOLUS (SEPSIS)
1000.0000 mL | Freq: Once | INTRAVENOUS | Status: AC
  Administered 2017-09-20: 1000 mL via INTRAVENOUS

## 2017-09-20 MED ORDER — HEPARIN SODIUM (PORCINE) 5000 UNIT/ML IJ SOLN
5000.0000 [IU] | Freq: Three times a day (TID) | INTRAMUSCULAR | Status: DC
  Administered 2017-09-20 – 2017-09-23 (×8): 5000 [IU] via SUBCUTANEOUS
  Filled 2017-09-20 (×8): qty 1

## 2017-09-20 NOTE — Progress Notes (Signed)
Referring Provider: Monico Blitz, MD Primary Care Physician:  Monico Blitz, MD Primary GI:  Dr. Gala Romney  Chief Complaint  Patient presents with  . Emesis    x 3 weeks    HPI:   Justin Parsons is a 76 y.o. male who presents For 3 weeks emesis and to schedule a repeat upper endoscopy. The patient was last seen in our office 08/15/2017 for GERD, alcoholic cirrhosis, nausea and vomiting. Noted history of Karlene Lineman cirrhosis plus/minus EtOH. History of esophageal stricture. He did have some elevated kidney function after recent discharge prior to his last visit. He was noted to be a poor historian but not doing well. Noted significant vomiting after eating and some nausea and vomiting for the past 2 months but unable to keep down food or fluids. Dysphagia persistent but improved. Noted dry heaves. Subjective weight loss. Hepatitis vaccine series updated and completed. States he could not eat or drink anything at his last visit. Recommend he proceed to the emergency department.  He was admitted with hypotension likely due to volume to lesion and intractable vomiting which improved after IV fluids. Blood cultures were negative. Endoscopy was completed 08/17/2017. Mucomyst gastric emptying study showed delayed gastric emptying CT of the neck showed 6 mm left upper lung nodule, atrophic salivary glands and he was referred to rheumatology. No bowel movement performed C. difficile. Recommended continue IV Protonix after discharge. Acute kidney injury was due to volume depletion and improved with hydration. His baseline creatinine is noted to be 1.8-2.2. On day of admission his creatinine was 3.75. Transaminitis likely due to hypotension and dehydration and his LFTs improved during his admission.  EGD was completed 08/17/2017 which found severe esophageal stenosis and initially the scope could not pass. However, the scope was downsized and the stenosis was dilated which then allowed scope to pass. Noted  diffuse mild inflammation in the entire examined stomach. Benign-appearing, intrinsic moderate stenosis at the pylorus which was not transversed. A dilator was passed for pyloric dilation. Recommended repeat EGD in 1 month for repeat treatment.  Today when he arrived he was noted to be weak, in a wheelchair, and profusely vomiting per our office staff. Today he is accompanied by a couple of his friends who drove him here. He states he has had profuse N/V for the past 2-3 weeks. He is very weak, unable to stand (he was unable to stand for his weight today in the office). He is having dizziness. Unable to tolerate food or fluids. If he smells something cooking he has immediate onset of vomiting. He has a bucket with him today he has been vomiting into. Denies abdominal pain, hematochezia, melena. Has lost a total of 60 lbs in the past several months. He is asking about incidental pulmonary nodule found on his last CT. No other GI complaints at this time. Denies chest pain, significant dyspnea.  Past Medical History:  Diagnosis Date  . Adenomatous polyp of colon   . Alcoholism ([place])    [place]  . Arthritis   . Atrial fibrillation ([place])   . Blindness of right eye 1997  . CAD (coronary artery disease)    Multivessel disease at cardiac catheterization June 2018, DES to LAD June 2018 with significant residual disease including diagonal and obtuse marginal vessels as well as RCA  . Cirrhosis of liver (HCC)    Associated portal gastropathy  . CKD (chronic kidney disease) stage 3, GFR 30-59 ml/min (HCC)   . Essential hypertension   .  Hiatal hernia   . Hypothyroidism   . 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 TIA (transient ischemic attack)     Past Surgical History:  Procedure Laterality Date  . CARDIAC CATHETERIZATION  2003  . COLONOSCOPY  08/2008   normal, repeat exam in 5-7 years  . COLONOSCOPY  2004   rectal adenomatous polyp  . COLONOSCOPY N/A 12/01/2014   GXQ:JJHERD rectum/elongated colon  .  CORONARY ATHERECTOMY N/A 05/03/2017   Procedure: Coronary Atherectomy;  Surgeon: Leonie Man, MD;  Location: [place] [facility];  Service: Cardiovascular;  Laterality: N/A;  . CORONARY STENT INTERVENTION N/A 05/03/2017   Procedure: Coronary Stent Intervention;  Surgeon: Leonie Man, MD;  Location: [place] [facility];  Service: Cardiovascular;  Laterality: N/A;  . ESOPHAGEAL DILATION N/A 02/07/2015   Procedure: ESOPHAGEAL DILATION;  Surgeon: Daneil Dolin, MD;  [facility];  Service: Endoscopy;  Laterality: N/A;  . ESOPHAGEAL DILATION N/A 08/17/2017   Procedure: ESOPHAGEAL DILATION;  Surgeon: Danie Binder, MD;  [facility];  Service: Endoscopy;  Laterality: N/A;  . ESOPHAGOGASTRODUODENOSCOPY  02/11/2012   Dr. Gala Romney: portal gastropathy, gastric erosions, esophageal ulcerations likely pill-induced, surveillance in 2 years  . ESOPHAGOGASTRODUODENOSCOPY N/A 12/01/2014   Dr. Volney American esophageal stricture dilated with the scope passage, portal gastropathy, negative H.pylori on gastric biopsies, esophageal biopsies benign  . ESOPHAGOGASTRODUODENOSCOPY N/A 02/07/2015   Procedure: ESOPHAGOGASTRODUODENOSCOPY (EGD);  Surgeon: Daneil Dolin, MD;  [facility];  Service: Endoscopy;  Laterality: N/A;  1115  . ESOPHAGOGASTRODUODENOSCOPY N/A 08/17/2017   Procedure: ESOPHAGOGASTRODUODENOSCOPY (EGD);  Surgeon: Danie Binder, MD;  [facility];  Service: Endoscopy;  Laterality: N/A;  . EYE SURGERY    . JOINT REPLACEMENT    . LEFT HEART CATH AND CORONARY ANGIOGRAPHY N/A 05/01/2017   Procedure: Left Heart Cath and Coronary Angiography;  Surgeon: Leonie Man, MD;  Location: [place] [facility];  Service: Cardiovascular;  Laterality: N/A;  . s/p eye implant  1997   artificial eye, right  . TOTAL HIP ARTHROPLASTY  2002  . TOTAL HIP ARTHROPLASTY  2006/2012   revision in 2012  . TOTAL KNEE ARTHROPLASTY  1999/2003   left/right    Current Outpatient  Prescriptions  Medication Sig Dispense Refill  . allopurinol (ZYLOPRIM) 100 MG tablet Take 1 tablet (100 mg total) by mouth daily. 30 tablet 1  . aspirin 81 MG chewable tablet Chew 81 mg by mouth daily.    . calcium carbonate (TUMS - DOSED IN MG ELEMENTAL CALCIUM) 500 MG chewable tablet Chew 1 tablet by mouth 2 (two) times daily.    . clopidogrel (PLAVIX) 75 MG tablet Take 1 tablet (75 mg total) by mouth daily.    . Ergocalciferol (VITAMIN D2) 400 units TABS Take 1 tablet by mouth 2 (two) times daily.    . famotidine (PEPCID) 20 MG tablet Take 1 tablet (20 mg total) by mouth daily. 30 tablet 0  . feeding supplement (BOOST / RESOURCE BREEZE) LIQD Take 1 Container by mouth 4 (four) times daily. 120 Container 0  . folic acid (FOLVITE) 1 MG tablet Take 1 tablet (1 mg total) by mouth daily.    Marland Kitchen levothyroxine (SYNTHROID, LEVOTHROID) 75 MCG tablet Take 75 mcg by mouth daily before breakfast.    . Melatonin 3 MG TABS Take 1 tablet by mouth at bedtime.    . metoprolol tartrate (LOPRESSOR) 25 MG tablet Take 0.5 tablets (12.5 mg total) by mouth 2 (two) times daily.  60 tablet 0  . omeprazole (PRILOSEC) 40 MG capsule Take 1 capsule (40 mg total) by mouth 2 (two) times daily. 60 capsule 5  . predniSONE (DELTASONE) 50 MG tablet Take 1 tablet (50 mg total) by mouth daily with breakfast. 3 tablet 0  . traMADol (ULTRAM) 50 MG tablet Take 1 tablet (50 mg total) by mouth every 6 (six) hours as needed. 6 tablet 0  . vitamin C (ASCORBIC ACID) 500 MG tablet Take 500 mg by mouth daily.    Marland Kitchen docusate sodium (COLACE) 100 MG capsule Take 1 capsule (100 mg total) by mouth 2 (two) times daily. (Patient not taking: Reported on 09/20/2017)    . Multiple Vitamin (MULTIVITAMIN) tablet Take 1 tablet by mouth daily.    . ondansetron (ZOFRAN) 4 MG tablet Take 4 mg by mouth every 8 (eight) hours as needed for nausea or vomiting.    . pantoprazole (PROTONIX) 40 MG tablet Take 1 tablet (40 mg total) by mouth 2 (two) times daily before  a meal. (Patient not taking: Reported on 09/20/2017) 60 tablet 1  . polyethylene glycol (MIRALAX / GLYCOLAX) packet Take 17 g by mouth daily as needed for mild constipation. (Patient not taking: Reported on 09/20/2017)    . thiamine 100 MG tablet Take 1 tablet (100 mg total) by mouth daily. (Patient not taking: Reported on 09/20/2017)     No current facility-administered medications for this visit.     Allergies as of 09/20/2017  . (No Known Allergies)    Family History  Problem Relation Age of Onset  . Ovarian cancer Sister   . Colon cancer Neg Hx     Social History   Social History  . Marital status: Divorced    Spouse name: N/A  . Number of children: N/A  . Years of education: N/A   Social History Main Topics  . Smoking status: Never Smoker  . Smokeless tobacco: Never Used     Comment: used to chew tobacco, none in 15 years  . Alcohol use 0.0 oz/week     Comment: no alcohol at present-in nursing home; half a gallon moonshine per week  . Drug use: No  . Sexual activity: Not Currently   Other Topics Concern  . None   Social History Narrative   One son deceased while in prison, drug addiction.    Review of Systems: General: Negative for anorexia, fever, chills. Admits weight loss, fatigue, weakness. ENT: Negative for hoarseness, difficulty swallowing. CV: Negative for chest pain, angina, palpitations, peripheral edema.  Respiratory: Negative for dyspnea at rest, cough, sputum, wheezing.  GI: See history of present illness. Heme: Negative for bruising or bleeding. Allergy: Negative for rash or hives.   Physical Exam: BP 110/82   Pulse 68   Temp (!) 97.2 F (36.2 C) (Oral)  General:   Alert and oriented. Pleasant and cooperative. Well-nourished and well-developed.  Eyes:  Without icterus, sclera clear and conjunctiva pink.  Ears:  Normal auditory acuity. Mouth:  Dry mucus membranes.  Cardiovascular:  S1, S2 present with 2/6 blowing systolic murmur appreciated.  Extremities without clubbing or edema. Respiratory:  Clear to auscultation bilaterally. No wheezes, rales, or rhonchi. No distress.  Gastrointestinal:  +BS, soft, non-tender and non-distended. No HSM noted. No guarding or rebound. No masses appreciated.  Rectal:  Deferred  Musculoskalatal:  Symmetrical without gross deformities. Neurologic:  Alert and oriented x4;  grossly normal neurologically. Psych:  Alert and cooperative. Normal mood and affect. Heme/Lymph/Immune: No excessive bruising noted.  09/20/2017 10:35 AM   Disclaimer: This note was dictated with voice recognition software. Similar sounding words can inadvertently be transcribed and may not be corrected upon review.

## 2017-09-20 NOTE — ED Triage Notes (Signed)
Patient sent by [facility] for possible throat dilation due to stricture. Patient states nausea and vomiting x1 month. Patient denies difficulty swallowing but states-"Everytime I eat or drink, as soon as it hits my stomach it comes back up." Patient states that he had dilation of throat in September. Per patient no BM in 5 days.

## 2017-09-20 NOTE — Assessment & Plan Note (Signed)
Noted gastric outlet obstruction during his last admission for similar symptoms as she is presenting today. His visit today was to schedule repeat endoscopy and gastric outlet dilation. However, he has had intractable nausea and vomiting for the past 2-3 weeks as per below. We will recommend he proceed to the emergency department for further triage, evaluation, and possible admission.

## 2017-09-20 NOTE — ED Notes (Signed)
Pt states he does not need to urinate at this time, aware of DO

## 2017-09-20 NOTE — Patient Instructions (Signed)
1. Proceed to the emergency department at [facility]. 2. We will see you or your admitted in the hospital.

## 2017-09-20 NOTE — Assessment & Plan Note (Signed)
The patient presented today to schedule repeat upper endoscopy and dilation with esophageal narrowing and pyloric stenosis. His pyloric stenosis was dilated and had some improvement but recommended repeat dilation. Today he presents very weak, unable to stand. He describes intractable nausea and vomiting for the past 2-3 weeks. He has a bucket with him and has been actively vomiting in our waiting room and in the exam room. States he is unable to keep down any food or fluids. Even-the-smell-of-food causes him to begin vomiting. The last time this occurred he had acute kidney injury with a creatinine of 3.75. Given his inability to tolerate food and fluids, and high risk for kidney injury given baseline renal insufficiency we will recommend he proceed to the emergency department. Triage nurse in the [facility] emergency department was notified that he would be coming.

## 2017-09-20 NOTE — ED Triage Notes (Signed)
Sent from physician office due to esophagus problems and cannot keep anything down-   Sent from gastro office per family member

## 2017-09-20 NOTE — ED Notes (Signed)
Have updated pt. On status of room

## 2017-09-20 NOTE — ED Notes (Signed)
Lab in room drawing blood cultures

## 2017-09-20 NOTE — Telephone Encounter (Signed)
Patient seen in the office and referred to the ER. Likely to be admitted to the hospital. See OV notes for details.

## 2017-09-20 NOTE — ED Notes (Signed)
  CRITICAL VALUE ALERT  Critical Value:  Lactic 4.45  Date & Time Notied:  09/20/17 1250  Provider Notified: Dr. Lita Mains   Orders Received/Actions taken: Notified Dr. Lita Mains

## 2017-09-20 NOTE — H&P (Signed)
History and Physical    Justin Parsons TKW:[id number] DOB: March 13, 1941 DOA: 09/20/2017  PCP: Monico Blitz, MD  Patient coming from: home  I have personally briefly reviewed patient's old medical records in [place]  Chief Complaint: Intractable vomiting   HPI: Justin Parsons is a 76 y.o. male with medical history significant of chronic atrial fibrillation, chronic kidney disease stage III, gout, esophageal pyloric strictures, was discharged from the hospital on 9/30 after being treated for intractable nausea and vomiting.  EGD done at that diffuse mild inflammation with edema of the entire stomach, severe intrinsic stenosis of the esophagus, which was dilated, gastric stenosis at the pylorus which was also dilated.  Patient reports after this procedure he was feeling better.  Was able to tolerate things like oatmeal.  Was discharged to a skilled nursing facility.  He was there for a few weeks and then reports had to return home when his insurance benefits ran out.  He reports that he has been unwell since he has returned home.  Has had increased nausea and vomiting and been unable to tolerate anything by mouth.  He is dizziness on standing and generally weak.  He has not had a bowel movement approximately 1 week.  He is occasionally passing flatus.  No fever, cough, shortness of breath.  He does complain of some "numbness" in his epigastric area.  He went to the GI office today and was actively vomiting in the waiting room.  He was sent to the ER for evaluation.  ED Course: Patient was noted to have low blood pressure in the 70s.  Heart rate was elevated in the 130s-140s in atrial fibrillation.  He received IV fluids which eventually helped his blood pressure, but he remains intermittently tachycardic.  Lab work indicated elevation of creatinine from baseline from 1.8-3.8.  Chest x-ray did not show any evidence of pneumonia.  There was concern that he might have recurrence of  esophageal stricture and has been referred for admission.  Review of Systems: As per HPI otherwise 10 point review of systems negative.    Past Medical History:  Diagnosis Date  . Adenomatous polyp of colon   . Alcoholism ([place])    [place]  . Arthritis   . Atrial fibrillation ([place])   . Blindness of right eye 1997  . CAD (coronary artery disease)    Multivessel disease at cardiac catheterization June 2018, DES to LAD June 2018 with significant residual disease including diagonal and obtuse marginal vessels as well as RCA  . Cirrhosis of liver (HCC)    Associated portal gastropathy  . CKD (chronic kidney disease) stage 3, GFR 30-59 ml/min (HCC)   . Essential hypertension   . Hiatal hernia   . Hypothyroidism   . 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 TIA (transient ischemic attack)     Past Surgical History:  Procedure Laterality Date  . CARDIAC CATHETERIZATION  2003  . COLONOSCOPY  08/2008   normal, repeat exam in 5-7 years  . COLONOSCOPY  2004   rectal adenomatous polyp  . COLONOSCOPY N/A 12/01/2014   JME:QASTMH rectum/elongated colon  . CORONARY ATHERECTOMY N/A 05/03/2017   Procedure: Coronary Atherectomy;  Surgeon: Leonie Man, MD;  Location: [facility];  Service: Cardiovascular;  Laterality: N/A;  . CORONARY STENT INTERVENTION N/A 05/03/2017   Procedure: Coronary Stent Intervention;  Surgeon: Leonie Man, MD;  Location: [facility];  Service: Cardiovascular;  Laterality: N/A;  . ESOPHAGEAL DILATION N/A 02/07/2015  Procedure: ESOPHAGEAL DILATION;  Surgeon: Daneil Dolin, MD;  [facility];  Service: Endoscopy;  Laterality: N/A;  . ESOPHAGEAL DILATION N/A 08/17/2017   Procedure: ESOPHAGEAL DILATION;  Surgeon: Danie Binder, MD;  [facility];  Service: Endoscopy;  Laterality: N/A;  . ESOPHAGOGASTRODUODENOSCOPY  02/11/2012   Dr. Gala Romney: portal gastropathy, gastric erosions, esophageal ulcerations likely pill-induced, surveillance in 2  years  . ESOPHAGOGASTRODUODENOSCOPY N/A 12/01/2014   Dr. Volney American esophageal stricture dilated with the scope passage, portal gastropathy, negative H.pylori on gastric biopsies, esophageal biopsies benign  . ESOPHAGOGASTRODUODENOSCOPY N/A 02/07/2015   Procedure: ESOPHAGOGASTRODUODENOSCOPY (EGD);  Surgeon: Daneil Dolin, MD;  [facility];  Service: Endoscopy;  Laterality: N/A;  1115  . ESOPHAGOGASTRODUODENOSCOPY N/A 08/17/2017   Procedure: ESOPHAGOGASTRODUODENOSCOPY (EGD);  Surgeon: Danie Binder, MD;  [facility];  Service: Endoscopy;  Laterality: N/A;  . EYE SURGERY    . JOINT REPLACEMENT    . LEFT HEART CATH AND CORONARY ANGIOGRAPHY N/A 05/01/2017   Procedure: Left Heart Cath and Coronary Angiography;  Surgeon: Leonie Man, MD;  Location: [facility];  Service: Cardiovascular;  Laterality: N/A;  . s/p eye implant  1997   artificial eye, right  . TOTAL HIP ARTHROPLASTY  2002  . TOTAL HIP ARTHROPLASTY  2006/2012   revision in 2012  . TOTAL KNEE ARTHROPLASTY  1999/2003   left/right     reports that he has never smoked. He has never used smokeless tobacco. He reports that he drinks alcohol. He reports that he does not use drugs.  No Known Allergies  Family History  Problem Relation Age of Onset  . Ovarian cancer Sister   . Colon cancer Neg Hx     Prior to Admission medications   Medication Sig Start Date End Date Taking? Authorizing Provider  allopurinol (ZYLOPRIM) 100 MG tablet Take 1 tablet (100 mg total) by mouth daily. Patient taking differently: Take 300 mg by mouth daily.  08/19/17  Yes TatShanon Brow, MD  aspirin 81 MG chewable tablet Chew 81 mg by mouth daily.   Yes [provider]  atorvastatin (LIPITOR) 80 MG tablet Take 80 mg by mouth daily at 6 PM.  09/12/17  Yes [provider]  calcium carbonate (TUMS - DOSED IN MG ELEMENTAL CALCIUM) 500 MG chewable tablet Chew 1 tablet by mouth 2 (two) times daily.   Yes [provider]  clopidogrel (PLAVIX) 75 MG tablet Take 1 tablet (75 mg total) by mouth daily. 05/07/17  Yes Barton Dubois, MD  Ergocalciferol (VITAMIN D2) 400 units TABS Take 1 tablet by mouth 2 (two) times daily.   Yes [provider]  famotidine (PEPCID) 20 MG tablet Take 1 tablet (20 mg total) by mouth daily. 08/18/17  Yes Tat, Shanon Brow, MD  feeding supplement (BOOST / RESOURCE BREEZE) LIQD Take 1 Container by mouth 4 (four) times daily. 08/18/17  Yes Tat, Shanon Brow, MD  folic acid (FOLVITE) 1 MG tablet Take 1 tablet (1 mg total) by mouth daily. 05/07/17  Yes Barton Dubois, MD  levothyroxine (SYNTHROID, LEVOTHROID) 50 MCG tablet Take 50 mcg by mouth daily before breakfast.  09/12/17  Yes [provider]  Melatonin 3 MG TABS Take 1 tablet by mouth at bedtime.   Yes [provider]  metoprolol tartrate (LOPRESSOR) 25 MG tablet Take 0.5 tablets (12.5 mg total) by mouth 2 (two) times daily. 08/18/17  Yes Tat, Shanon Brow, MD  omeprazole (PRILOSEC) 40 MG capsule Take 1 capsule (  40 mg total) by mouth 2 (two) times daily. 03/12/16  Yes Carlis Stable, NP  oxazepam (SERAX) 10 MG capsule Take 10 mg by mouth at bedtime as needed for sleep or anxiety.  09/13/17  Yes [provider]  traMADol (ULTRAM) 50 MG tablet Take 1 tablet (50 mg total) by mouth every 6 (six) hours as needed. 08/18/17  Yes Tat, Shanon Brow, MD  vitamin C (ASCORBIC ACID) 500 MG tablet Take 500 mg by mouth daily.   Yes [provider]    Physical Exam: Vitals:   09/20/17 1600 09/20/17 1630 09/20/17 1700 09/20/17 1842  BP: 126/87 118/76 120/82   Pulse: 90 (!) 113 68   Resp: 18 18 16    Temp:      TempSrc:      SpO2: 94% 96% 93%   Weight:    105.4 kg (232 lb 5.8 oz)  Height:    5' 9"  (1.753 m)    Constitutional: NAD, calm, comfortable Vitals:   09/20/17 1600 09/20/17 1630 09/20/17 1700 09/20/17 1842  BP: 126/87 118/76 120/82   Pulse: 90 (!) 113 68   Resp: 18 18 16    Temp:      TempSrc:      SpO2: 94% 96%  93%   Weight:    105.4 kg (232 lb 5.8 oz)  Height:    5' 9"  (1.753 m)   Eyes: right globe has been removed in the past, lids and conjunctivae of left eye normal ENMT: Mucous membranes are dry. Posterior pharynx clear of any exudate or lesions.Normal dentition.  Neck: normal, supple, no masses, no thyromegaly Respiratory: clear to auscultation bilaterally, no wheezing, no crackles. Normal respiratory effort. No accessory muscle use.  Cardiovascular: irregular, no murmurs / rubs / gallops.1+ extremity edema. 2+ pedal pulses. No carotid bruits.  Abdomen: soft, no tenderness, no masses palpated. No hepatosplenomegaly. Bowel sounds positive.  Musculoskeletal: no clubbing / cyanosis. No joint deformity upper and lower extremities. Good ROM, no contractures. Normal muscle tone.  Skin: no rashes, lesions, ulcers. No induration Neurologic: CN 2-12 grossly intact. Sensation intact, DTR normal. Strength 5/5 in all 4.  Psychiatric: Normal judgment and insight. Alert and oriented x 3. Normal mood.   Labs on Admission: I have personally reviewed following labs and imaging studies  CBC:  Recent Labs Lab 09/20/17 1224  WBC 12.3*  NEUTROABS 8.3*  HGB 13.9  HCT 43.4  MCV 88.0  PLT 902   Basic Metabolic Panel:  Recent Labs Lab 09/20/17 1224  NA 136  K 4.0  CL 94*  CO2 23  GLUCOSE 116*  BUN 44*  CREATININE 3.89*  CALCIUM 9.9   GFR: Estimated Creatinine Clearance: 19 mL/min (A) (by C-G formula based on SCr of 3.89 mg/dL (H)). Liver Function Tests:  Recent Labs Lab 09/20/17 1224  AST 32  ALT 15*  ALKPHOS 95  BILITOT 1.4*  PROT 6.8  ALBUMIN 3.4*   No results for input(s): LIPASE, AMYLASE in the last 168 hours. No results for input(s): AMMONIA in the last 168 hours. Coagulation Profile:  Recent Labs Lab 09/20/17 1224  INR 1.08   Cardiac Enzymes: No results for input(s): CKTOTAL, CKMB, CKMBINDEX, TROPONINI in the last 168 hours. BNP (last 3 results) No results for  input(s): PROBNP in the last 8760 hours. HbA1C: No results for input(s): HGBA1C in the last 72 hours. CBG: No results for input(s): GLUCAP in the last 168 hours. Lipid Profile: No results for input(s): CHOL, HDL, LDLCALC, TRIG, CHOLHDL, LDLDIRECT in  the last 72 hours. Thyroid Function Tests:  Recent Labs  09/20/17 1248  TSH 2.160   Anemia Panel: No results for input(s): VITAMINB12, FOLATE, FERRITIN, TIBC, IRON, RETICCTPCT in the last 72 hours. Urine analysis:    Component Value Date/Time   COLORURINE YELLOW 08/15/2017 1521   APPEARANCEUR HAZY (A) 08/15/2017 1521   LABSPEC 1.011 08/15/2017 1521   PHURINE 5.0 08/15/2017 1521   GLUCOSEU NEGATIVE 08/15/2017 1521   HGBUR NEGATIVE 08/15/2017 1521   BILIRUBINUR NEGATIVE 08/15/2017 1521   KETONESUR NEGATIVE 08/15/2017 1521   PROTEINUR NEGATIVE 08/15/2017 1521   UROBILINOGEN 0.2 11/12/2013 2315   NITRITE NEGATIVE 08/15/2017 1521   LEUKOCYTESUR LARGE (A) 08/15/2017 1521    Radiological Exams on Admission: Dg Chest Portable 1 View  Result Date: 09/20/2017 CLINICAL DATA:  VOMITING X 1 MONTH, HTN, A-FIB, NON SMOKER EXAM: PORTABLE CHEST 1 VIEW COMPARISON:  08/06/2017 FINDINGS: Cardiac silhouette is mildly enlarged. No mediastinal or hilar masses. Clear lungs. No convincing pleural effusion. No pneumothorax. Skeletal structures are grossly intact. IMPRESSION: No acute cardiopulmonary disease. Electronically Signed   By: Lajean Manes M.D.   On: 09/20/2017 13:40    EKG: Independently reviewed. A fib rvr  Assessment/Plan Active Problems:   GERD (gastroesophageal reflux disease)   History of esophageal stricture   Acute renal failure superimposed on stage 3 chronic kidney disease (HCC)   Dehydration   AKI (acute kidney injury) ([place])   Gastric outlet obstruction   Intractable vomiting   Atrial fibrillation with RVR ([place])    1. Intractable nausea and vomiting.  Unclear etiology.  Possibly related to recurrent esophageal stricture.   Start on clear liquids and continue on around-the-clock Zofran.  Will request gastroenterology to follow patient. 2. Dehydration.  We will provide gentle hydration. 3. Acute renal failure superimposed on stage III chronic kidney disease.  Likely related to dehydration and volume depletion.  Continue IV fluids and recheck labs in a.m. 4. GERD.  Continue on PPI 5. Atrial fibrillation with rapid ventricular response.  Likely related to dehydration.  Continue IV fluids.  Will use metoprolol, IV every 6 hours.  If remains elevated, may need to consider Cardizem infusion.  DVT prophylaxis: heparin Code Status: full code Family Communication: no family present Disposition Plan: discharge home once improved Consults called: GI Admission status: inpatient, stepdown   Brayln Duque MD [facility] Pager [phone]  If 7PM-7AM, please contact night-coverage [email or web address] Password [email or web address]  09/20/2017, 6:52 PM

## 2017-09-20 NOTE — ED Provider Notes (Signed)
[facility] EMERGENCY DEPARTMENT Provider Note   CSN: [id number] Arrival date & time: 09/20/17  1137     History   Chief Complaint Chief Complaint  Patient presents with  . Emesis    HPI Justin Parsons is a 76 y.o. male.  HPI Patient presents to the emergency department after being seen by her his gastroenterologist today for possible endoscopy and gastric outlet stricture dilation.  Noted to be vomiting for the past 2-3 weeks.  States he is having dizziness and is unable to stand.  Denies any blood in the vomit.  Last bowel movement was 5 days ago.  Denies bloody or melanotic stool before that.  Denies any fever or chills.  No shortness of breath, palpitations or chest pain.  Patient does have some mild upper abdominal discomfort. Past Medical History:  Diagnosis Date  . Adenomatous polyp of colon   . Alcoholism ([place])    Moonshine  . Arthritis   . Atrial fibrillation ([place])   . Blindness of right eye 1997  . CAD (coronary artery disease)    Multivessel disease at cardiac catheterization June 2018, DES to LAD June 2018 with significant residual disease including diagonal and obtuse marginal vessels as well as RCA  . Cirrhosis of liver (HCC)    Associated portal gastropathy  . CKD (chronic kidney disease) stage 3, GFR 30-59 ml/min (HCC)   . Essential hypertension   . Hiatal hernia   . Hypothyroidism   . 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 TIA (transient ischemic attack)     Patient Active Problem List   Diagnosis Date Noted  . Intractable vomiting 09/20/2017  . Atrial fibrillation, chronic ([place]) 08/18/2017  . Gastric outlet obstruction   . Acute kidney injury ([place])   . Nausea with vomiting 08/15/2017  . Hypotension 08/15/2017  . Dehydration 08/15/2017  . Intractable vomiting with nausea 08/15/2017  . Transaminasemia 08/15/2017  . AF (paroxysmal atrial fibrillation) ([place]) 08/15/2017  . 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 Abnormal  liver enzymes   . Coronary artery disease involving native coronary artery of native heart with angina pectoris ([place])   . Tobacco abuse   . Acute renal failure superimposed on stage 3 chronic kidney disease ([place])   . Alcohol use disorder   . Non-ST elevation (NSTEMI) myocardial infarction ([place]) 04/29/2017  . Delirium tremens ([place])   . Shock circulatory ([place])   . Confusion   . Acute encephalopathy 04/27/2017  . Schatzki's ring   . Esophageal varices ([place])   . History of esophageal stricture 01/20/2015  . Esophageal stricture   . History of colonic polyps   . Dysphagia, pharyngoesophageal phase 11/04/2014  . Suicidal ideation 11/17/2013  . Acute renal failure ([place]) 11/17/2013  . Acute diastolic CHF (congestive heart failure) ([place]) 11/13/2013  . Acute respiratory failure ([place]) 11/13/2013  . Community acquired pneumonia 11/12/2013  . A-fib ([place]) 11/12/2013  . CKD (chronic kidney disease), stage III ([place]) 11/12/2013  . Unspecified hypothyroidism 11/12/2013  . Dyspnea 11/12/2013  . Thrombocytopenia ([place]) 06/05/2012  . ARF (acute renal failure) ([place]) 06/04/2012  . Obesity 06/04/2012  . Cirrhosis ([place]) 01/29/2012  . Adenomatous polyp 01/29/2012  . Renal insufficiency 05/01/2011  . Anemia 05/01/2011  . Rectal bleed 05/01/2011  . GERD (gastroesophageal reflux disease) 05/01/2011    Past Surgical History:  Procedure Laterality Date  . CARDIAC CATHETERIZATION  2003  . COLONOSCOPY  08/2008   normal, repeat exam in 5-7 years  . COLONOSCOPY  2004  rectal adenomatous polyp  . COLONOSCOPY N/A 12/01/2014   CLE:XNTZGY rectum/elongated colon  . CORONARY ATHERECTOMY N/A 05/03/2017   Procedure: Coronary Atherectomy;  Surgeon: Leonie Man, MD;  Location: [facility];  Service: Cardiovascular;  Laterality: N/A;  . CORONARY STENT INTERVENTION N/A 05/03/2017   Procedure: Coronary Stent Intervention;  Surgeon: Leonie Man, MD;  Location: [facility];  Service:  Cardiovascular;  Laterality: N/A;  . ESOPHAGEAL DILATION N/A 02/07/2015   Procedure: ESOPHAGEAL DILATION;  Surgeon: Daneil Dolin, MD;  [facility];  Service: Endoscopy;  Laterality: N/A;  . ESOPHAGEAL DILATION N/A 08/17/2017   Procedure: ESOPHAGEAL DILATION;  Surgeon: Danie Binder, MD;  [facility];  Service: Endoscopy;  Laterality: N/A;  . ESOPHAGOGASTRODUODENOSCOPY  02/11/2012   Dr. Gala Romney: portal gastropathy, gastric erosions, esophageal ulcerations likely pill-induced, surveillance in 2 years  . ESOPHAGOGASTRODUODENOSCOPY N/A 12/01/2014   Dr. Volney American esophageal stricture dilated with the scope passage, portal gastropathy, negative H.pylori on gastric biopsies, esophageal biopsies benign  . ESOPHAGOGASTRODUODENOSCOPY N/A 02/07/2015   Procedure: ESOPHAGOGASTRODUODENOSCOPY (EGD);  Surgeon: Daneil Dolin, MD;  [facility];  Service: Endoscopy;  Laterality: N/A;  1115  . ESOPHAGOGASTRODUODENOSCOPY N/A 08/17/2017   Procedure: ESOPHAGOGASTRODUODENOSCOPY (EGD);  Surgeon: Danie Binder, MD;  [facility];  Service: Endoscopy;  Laterality: N/A;  . EYE SURGERY    . JOINT REPLACEMENT    . LEFT HEART CATH AND CORONARY ANGIOGRAPHY N/A 05/01/2017   Procedure: Left Heart Cath and Coronary Angiography;  Surgeon: Leonie Man, MD;  Location: [facility];  Service: Cardiovascular;  Laterality: N/A;  . s/p eye implant  1997   artificial eye, right  . TOTAL HIP ARTHROPLASTY  2002  . TOTAL HIP ARTHROPLASTY  2006/2012   revision in 2012  . TOTAL KNEE ARTHROPLASTY  1999/2003   left/right       Home Medications    Prior to Admission medications   Medication Sig Start Date End Date Taking? Authorizing Provider  allopurinol (ZYLOPRIM) 100 MG tablet Take 1 tablet (100 mg total) by mouth daily. Patient taking differently: Take 300 mg by mouth daily.  08/19/17  Yes TatShanon Brow, MD  aspirin 81 MG chewable tablet Chew 81 mg by mouth daily.   Yes  [provider]  atorvastatin (LIPITOR) 80 MG tablet Take 80 mg by mouth daily at 6 PM.  09/12/17  Yes [provider]  calcium carbonate (TUMS - DOSED IN MG ELEMENTAL CALCIUM) 500 MG chewable tablet Chew 1 tablet by mouth 2 (two) times daily.   Yes [provider]  clopidogrel (PLAVIX) 75 MG tablet Take 1 tablet (75 mg total) by mouth daily. 05/07/17  Yes Barton Dubois, MD  Ergocalciferol (VITAMIN D2) 400 units TABS Take 1 tablet by mouth 2 (two) times daily.   Yes [provider]  famotidine (PEPCID) 20 MG tablet Take 1 tablet (20 mg total) by mouth daily. 08/18/17  Yes Tat, Shanon Brow, MD  feeding supplement (BOOST / RESOURCE BREEZE) LIQD Take 1 Container by mouth 4 (four) times daily. 08/18/17  Yes Tat, Shanon Brow, MD  folic acid (FOLVITE) 1 MG tablet Take 1 tablet (1 mg total) by mouth daily. 05/07/17  Yes Barton Dubois, MD  levothyroxine (SYNTHROID, LEVOTHROID) 50 MCG tablet Take 50 mcg by mouth daily before breakfast.  09/12/17  Yes [provider]  Melatonin 3 MG TABS Take 1 tablet by mouth at bedtime.   Yes [provider]  metoprolol tartrate (LOPRESSOR) 25 MG tablet Take 0.5 tablets (12.5 mg total) by mouth 2 (two) times daily. 08/18/17  Yes Tat, Shanon Brow, MD  omeprazole (PRILOSEC) 40 MG capsule Take 1 capsule (40 mg total) by mouth 2 (two) times daily. 03/12/16  Yes Carlis Stable, NP  oxazepam (SERAX) 10 MG capsule Take 10 mg by mouth at bedtime as needed for sleep or anxiety.  09/13/17  Yes [provider]  predniSONE (DELTASONE) 50 MG tablet Take 1 tablet (50 mg total) by mouth daily with breakfast. 08/19/17  Yes Tat, Shanon Brow, MD  traMADol (ULTRAM) 50 MG tablet Take 1 tablet (50 mg total) by mouth every 6 (six) hours as needed. 08/18/17  Yes Tat, Shanon Brow, MD  vitamin C (ASCORBIC ACID) 500 MG tablet Take 500 mg by mouth daily.   Yes [provider]    Family History Family History  Problem Relation Age of Onset  . Ovarian cancer Sister    . Colon cancer Neg Hx     Social History Social History  Substance Use Topics  . Smoking status: Never Smoker  . Smokeless tobacco: Never Used     Comment: used to chew tobacco, none in 15 years  . Alcohol use 0.0 oz/week     Comment: no alcohol at present-in nursing home; half a gallon moonshine per week     Allergies   Patient has no known allergies.   Review of Systems Review of Systems  Constitutional: Positive for fatigue. Negative for chills and fever.  HENT: Positive for trouble swallowing.   Respiratory: Negative for cough and shortness of breath.   Cardiovascular: Negative for chest pain, palpitations and leg swelling.  Gastrointestinal: Positive for abdominal pain, constipation, nausea and vomiting. Negative for blood in stool and diarrhea.  Genitourinary: Negative for dysuria, flank pain and frequency.  Musculoskeletal: Negative for back pain, myalgias, neck pain and neck stiffness.  Skin: Negative for rash and wound.  Neurological: Positive for dizziness, weakness and light-headedness. Negative for numbness and headaches.     Physical Exam Updated Vital Signs BP 103/84   Pulse (!) 135   Temp (!) 97.2 F (36.2 C) (Tympanic)   Resp (!) 28   Ht 5' 9"  (1.753 m)   Wt 106.6 kg (235 lb)   SpO2 90%   BMI 34.70 kg/m   Physical Exam  Constitutional: He is oriented to person, place, and time. He appears well-developed and well-nourished. No distress.  HENT:  Head: Normocephalic and atraumatic.  Mouth/Throat: Oropharynx is clear and moist. No oropharyngeal exudate.  Missing right globe  Eyes: Pupils are equal, round, and reactive to light. EOM are normal.  Neck: Normal range of motion. Neck supple. No JVD present.  Cardiovascular:  Tachycardia.  Irregularly irregular  Pulmonary/Chest: Effort normal and breath sounds normal.  Expiratory wheezing throughout.  No respiratory distress.  Abdominal: Soft. Bowel sounds are normal. There is tenderness. There is no  rebound and no guarding.  Mild epigastric tenderness to palpation.  Diminished bowel sounds throughout  Musculoskeletal: Normal range of motion. He exhibits no edema or tenderness.  Neurological: He is alert and oriented to person, place, and time.  Moving all extremities.  Sensation grossly intact.  Skin: Skin is warm and dry. Capillary refill takes less than 2 seconds. No rash noted. No erythema.  Psychiatric: He has a normal mood and affect. His behavior is normal.  Nursing note and vitals reviewed.    ED Treatments / Results  Labs (all labs ordered are listed,  but only abnormal results are displayed) Labs Reviewed  COMPREHENSIVE METABOLIC PANEL - Abnormal; Notable for the following:       Result Value   Chloride 94 (*)    Glucose, Bld 116 (*)    BUN 44 (*)    Creatinine, Ser 3.89 (*)    Albumin 3.4 (*)    ALT 15 (*)    Total Bilirubin 1.4 (*)    GFR calc non Af Amer 14 (*)    GFR calc Af Amer 16 (*)    Anion gap 19 (*)    All other components within normal limits  CBC WITH DIFFERENTIAL/PLATELET - Abnormal; Notable for the following:    WBC 12.3 (*)    RDW 19.0 (*)    Neutro Abs 8.3 (*)    All other components within normal limits  CG4 I-STAT (LACTIC ACID) - Abnormal; Notable for the following:    Lactic Acid, Venous 4.45 (*)    All other components within normal limits  CG4 I-STAT (LACTIC ACID) - Abnormal; Notable for the following:    Lactic Acid, Venous 2.02 (*)    All other components within normal limits  CULTURE, BLOOD (ROUTINE X 2)  CULTURE, BLOOD (ROUTINE X 2)  PROTIME-INR  TSH  URINALYSIS, ROUTINE W REFLEX MICROSCOPIC  T4, FREE  I-STAT CG4 LACTIC ACID, ED  I-STAT CG4 LACTIC ACID, ED    EKG  EKG Interpretation  Date/Time:  Friday September 20 2017 12:27:04 EDT Ventricular Rate:  138 PR Interval:    QRS Duration: 114 QT Interval:  296 QTC Calculation: 449 R Axis:   3 Text Interpretation:  Atrial fibrillation Paired ventricular premature complexes  Incomplete left bundle branch block Low voltage, precordial leads Anterior Q waves, possibly due to ILBBB Confirmed by Julianne Rice [phone]) on 09/20/2017 12:45:36 PM       Radiology Dg Chest Portable 1 View  Result Date: 09/20/2017 CLINICAL DATA:  VOMITING X 1 MONTH, HTN, A-FIB, NON SMOKER EXAM: PORTABLE CHEST 1 VIEW COMPARISON:  08/06/2017 FINDINGS: Cardiac silhouette is mildly enlarged. No mediastinal or hilar masses. Clear lungs. No convincing pleural effusion. No pneumothorax. Skeletal structures are grossly intact. IMPRESSION: No acute cardiopulmonary disease. Electronically Signed   By: Lajean Manes M.D.   On: 09/20/2017 13:40    Procedures Procedures (including critical care time)  Medications Ordered in ED Medications  sodium chloride 0.9 % bolus 500 mL (500 mLs Intravenous New Bag/Given 09/20/17 1238)  sodium chloride 0.9 % bolus 1,000 mL (1,000 mLs Intravenous New Bag/Given 09/20/17 1322)   CRITICAL CARE Performed by: Lita Mains, Kavon Valenza Total critical care time: 30 minutes Critical care time was exclusive of separately billable procedures and treating other patients. Critical care was necessary to treat or prevent imminent or life-threatening deterioration. Critical care was time spent personally by me on the following activities: development of treatment plan with patient and/or surrogate as well as nursing, discussions with consultants, evaluation of patient's response to treatment, examination of patient, obtaining history from patient or surrogate, ordering and performing treatments and interventions, ordering and review of laboratory studies, ordering and review of radiographic studies, pulse oximetry and re-evaluation of patient's condition.  Initial Impression / Assessment and Plan / ED Course  I have reviewed the triage vital signs and the nursing notes.  Pertinent labs & imaging results that were available during my care of the patient were reviewed by me and  considered in my medical decision making (see chart for details).    Patient's blood pressure is  significantly improved after IV fluids.  Has acute on chronic renal insufficiency likely due to dehydration.  Abdominal exam is benign.  Initial elevated lactic acid likely due to dehydration and sepsis.  Lactic acid is improved after fluids.  Discussed with hospitalist and will admit to stepdown bed.   Final Clinical Impressions(s) / ED Diagnoses   Final diagnoses:  Dehydration  Hypotension, unspecified hypotension type  AKI (acute kidney injury) ([place])  Atrial fibrillation with RVR ([place])    New Prescriptions New Prescriptions   No medications on file     Julianne Rice, MD 09/20/17 1549

## 2017-09-21 DIAGNOSIS — E860 Dehydration: Secondary | ICD-10-CM

## 2017-09-21 DIAGNOSIS — K222 Esophageal obstruction: Principal | ICD-10-CM

## 2017-09-21 DIAGNOSIS — K7030 Alcoholic cirrhosis of liver without ascites: Secondary | ICD-10-CM

## 2017-09-21 LAB — BASIC METABOLIC PANEL
ANION GAP: 11 (ref 5–15)
BUN: 40 mg/dL — ABNORMAL HIGH (ref 6–20)
CALCIUM: 9.2 mg/dL (ref 8.9–10.3)
CO2: 28 mmol/L (ref 22–32)
Chloride: 98 mmol/L — ABNORMAL LOW (ref 101–111)
Creatinine, Ser: 3.26 mg/dL — ABNORMAL HIGH (ref 0.61–1.24)
GFR calc Af Amer: 20 mL/min — ABNORMAL LOW (ref >60)
GFR calc non Af Amer: 17 mL/min — ABNORMAL LOW (ref >60)
GLUCOSE: 85 mg/dL (ref 65–99)
POTASSIUM: 3.4 mmol/L — AB (ref 3.5–5.1)
Sodium: 137 mmol/L (ref 135–145)

## 2017-09-21 LAB — CBC
HCT: 35.9 % — ABNORMAL LOW (ref 39.0–52.0)
HEMOGLOBIN: 11.5 g/dL — AB (ref 13.0–17.0)
MCH: 28.2 pg (ref 26.0–34.0)
MCHC: 32 g/dL (ref 30.0–36.0)
MCV: 88 fL (ref 78.0–100.0)
Platelets: 135 10*3/uL — ABNORMAL LOW (ref 150–400)
RBC: 4.08 MIL/uL — AB (ref 4.22–5.81)
RDW: 19.2 % — ABNORMAL HIGH (ref 11.5–15.5)
WBC: 7.6 10*3/uL (ref 4.0–10.5)

## 2017-09-21 LAB — MRSA PCR SCREENING: MRSA BY PCR: NEGATIVE

## 2017-09-21 MED ORDER — METOPROLOL TARTRATE 25 MG PO TABS
12.5000 mg | ORAL_TABLET | Freq: Two times a day (BID) | ORAL | Status: DC
  Administered 2017-09-21 – 2017-09-24 (×5): 12.5 mg via ORAL
  Filled 2017-09-21 (×6): qty 1

## 2017-09-21 MED ORDER — BOOST / RESOURCE BREEZE PO LIQD
1.0000 | Freq: Two times a day (BID) | ORAL | Status: DC
  Administered 2017-09-21 – 2017-09-24 (×5): 1 via ORAL

## 2017-09-21 MED ORDER — ADULT MULTIVITAMIN LIQUID CH
15.0000 mL | Freq: Every day | ORAL | Status: DC
  Administered 2017-09-21 – 2017-09-24 (×3): 15 mL via ORAL
  Filled 2017-09-21 (×5): qty 15

## 2017-09-21 MED ORDER — PRO-STAT SUGAR FREE PO LIQD
30.0000 mL | Freq: Two times a day (BID) | ORAL | Status: DC
  Administered 2017-09-21 – 2017-09-24 (×6): 30 mL via ORAL
  Filled 2017-09-21 (×6): qty 30

## 2017-09-21 NOTE — Progress Notes (Signed)
PROGRESS NOTE    MYKING SAR  YOV:[id number] DOB: 11-09-1941 DOA: 09/20/2017 PCP: Monico Blitz, MD    Brief Narrative:  76 year old male with a history of chronic atrial fibrillation, chronic kidney disease stage III, esophageal and pyloric strictures status post dilation in 07/2017, presents to the hospital with complaints of intractable nausea and vomiting for the past 2-3 weeks.  He presented with dehydration and acute on chronic kidney injury.  Reports he is unable to keep anything down by mouth.  He was sent to the ED from gastroenterology office.  There was concern that he may need another esophageal dilatation.  The patient is currently on IV hydration for dehydration.  Diet is being advanced.  He is tolerating clear liquids.  GI has been consulted.   Assessment & Plan:   Active Problems:   GERD (gastroesophageal reflux disease)   History of esophageal stricture   Acute renal failure superimposed on stage 3 chronic kidney disease (HCC)   Dehydration   AKI (acute kidney injury) ([place])   Gastric outlet obstruction   Intractable vomiting   Atrial fibrillation with RVR ([place])   1. Intractable nausea and vomiting.  Unclear etiology.  Possibly related to recurrent esophageal stricture.    Appears to be tolerating clear liquids.  Continue scheduled Zofran.  Advance to full liquids.    GI consulted. 2. Dehydration.  Improving with hydration 3. Acute renal failure superimposed on stage III chronic kidney disease.  Likely related to dehydration and volume depletion.    Baseline creatinine approximately 1.8.  Presents with a creatinine of 3.8.  Creatinine has improved to 3.2 today.  Continue IV fluids for now. 4. GERD.  Continue on PPI 5. Atrial fibrillation with rapid ventricular response.  Likely related to dehydration.    Heart rate was better controlled with hydration and IV Lopressor.  Since he is tolerating clear liquids, will restart oral Lopressor.  Not felt to be a good  candidate for anticoagulation due to his portal gastropathy.  He is continued on dual anti-platelet therapy. 6. Alcoholic cirrhosis.  Appears to be compensated at this time.  Follow-up with GI as an outpatient. 7. Coronary artery disease status post stenting in the past.  No complaints of chest pain.  Continue on dual antiplatelet therapy.   DVT prophylaxis: Heparin Code Status: Full code Family Communication: No family present Disposition Plan: Discharge home once improved   Consultants:     Procedures:     Antimicrobials:       Subjective: Feeling better today.  No nausea or vomiting.  Tolerated clear liquids.  Objective: Vitals:   09/21/17 0700 09/21/17 0800 09/21/17 0900 09/21/17 1000  BP: 91/69 (!) 86/70 94/66 (!) 82/56  Pulse: (!) 57 89 94 93  Resp: 19 15 19 16   Temp:  98.1 F (36.7 C)    TempSrc:  Axillary    SpO2: 96% 94% 91% 92%  Weight:      Height:        Intake/Output Summary (Last 24 hours) at 09/21/17 1022 Last data filed at 09/21/17 1000  Gross per 24 hour  Intake             1935 ml  Output              200 ml  Net             1735 ml   Filed Weights   09/20/17 1204 09/20/17 1842 09/21/17 0500  Weight: 106.6 kg (235 lb) 105.4 kg (  232 lb 5.8 oz) 107 kg (235 lb 14.3 oz)    Examination:  General exam: Appears calm and comfortable  Respiratory system: Clear to auscultation. Respiratory effort normal. Cardiovascular system: S1 & S2 heard, irregular. No JVD, murmurs, rubs, gallops or clicks. No pedal edema. Gastrointestinal system: Abdomen is nondistended, soft and nontender. No organomegaly or masses felt. Normal bowel sounds heard. Central nervous system: Alert and oriented. No focal neurological deficits. Extremities: Symmetric 5 x 5 power. Skin: No rashes, lesions or ulcers Psychiatry: Judgement and insight appear normal. Mood & affect appropriate.     Data Reviewed: I have personally reviewed following labs and imaging  studies  CBC:  Recent Labs Lab 09/20/17 1224 09/21/17 0357  WBC 12.3* 7.6  NEUTROABS 8.3*  --   HGB 13.9 11.5*  HCT 43.4 35.9*  MCV 88.0 88.0  PLT 179 147*   Basic Metabolic Panel:  Recent Labs Lab 09/20/17 1224 09/21/17 0357  NA 136 137  K 4.0 3.4*  CL 94* 98*  CO2 23 28  GLUCOSE 116* 85  BUN 44* 40*  CREATININE 3.89* 3.26*  CALCIUM 9.9 9.2   GFR: Estimated Creatinine Clearance: 22.9 mL/min (A) (by C-G formula based on SCr of 3.26 mg/dL (H)). Liver Function Tests:  Recent Labs Lab 09/20/17 1224  AST 32  ALT 15*  ALKPHOS 95  BILITOT 1.4*  PROT 6.8  ALBUMIN 3.4*   No results for input(s): LIPASE, AMYLASE in the last 168 hours. No results for input(s): AMMONIA in the last 168 hours. Coagulation Profile:  Recent Labs Lab 09/20/17 1224  INR 1.08   Cardiac Enzymes: No results for input(s): CKTOTAL, CKMB, CKMBINDEX, TROPONINI in the last 168 hours. BNP (last 3 results) No results for input(s): PROBNP in the last 8760 hours. HbA1C: No results for input(s): HGBA1C in the last 72 hours. CBG: No results for input(s): GLUCAP in the last 168 hours. Lipid Profile: No results for input(s): CHOL, HDL, LDLCALC, TRIG, CHOLHDL, LDLDIRECT in the last 72 hours. Thyroid Function Tests:  Recent Labs  09/20/17 1248  TSH 2.160  FREET4 1.38*   Anemia Panel: No results for input(s): VITAMINB12, FOLATE, FERRITIN, TIBC, IRON, RETICCTPCT in the last 72 hours. Sepsis Labs:  Recent Labs Lab 09/20/17 1238 09/20/17 1419  LATICACIDVEN 4.45* 2.02*    Recent Results (from the past 240 hour(s))  Culture, blood (Routine x 2)     Status: None (Preliminary result)   Collection Time: 09/20/17 12:26 PM  Result Value Ref Range Status   Specimen Description RIGHT ANTECUBITAL  Final   Special Requests   Final    BOTTLES DRAWN AEROBIC AND ANAEROBIC Blood Culture adequate volume   Culture NO GROWTH < 24 HOURS  Final   Report Status PENDING  Incomplete  Culture, blood  (Routine x 2)     Status: None (Preliminary result)   Collection Time: 09/20/17 12:26 PM  Result Value Ref Range Status   Specimen Description BLOOD RIGHT WRIST  Final   Special Requests   Final    BOTTLES DRAWN AEROBIC AND ANAEROBIC Blood Culture results may not be optimal due to an inadequate volume of blood received in culture bottles   Culture NO GROWTH < 24 HOURS  Final   Report Status PENDING  Incomplete  MRSA PCR Screening     Status: None   Collection Time: 09/20/17  6:39 PM  Result Value Ref Range Status   MRSA by PCR NEGATIVE NEGATIVE Final    Comment:  The GeneXpert MRSA Assay (FDA approved for NASAL specimens only), is one component of a comprehensive MRSA colonization surveillance program. It is not intended to diagnose MRSA infection nor to guide or monitor treatment for MRSA infections.          Radiology Studies: Ct Abdomen Pelvis Wo Contrast  Result Date: 09/20/2017 CLINICAL DATA:  Nausea and vomiting times 2-3 days. EXAM: CT ABDOMEN AND PELVIS WITHOUT CONTRAST TECHNIQUE: Multidetector CT imaging of the abdomen and pelvis was performed following the standard protocol without IV contrast. COMPARISON:  04/27/2017 CT FINDINGS: Lower chest: Stable cardiomegaly without pericardial effusion. Left main and three-vessel coronary arteriosclerosis is identified as before. There is scarring and/or atelectasis in the right lower lobe. No effusion or pneumothorax. Hepatobiliary: Surface nodularity of the liver consistent with cirrhosis. Right hepatic granuloma. No biliary dilatation or space-occupying mass on this unenhanced study. Layering gallstones without complication. Pancreas: Mild pancreatic atrophy without ductal dilatation, inflammation or mass. Spleen: No splenomegaly.  No focal mass. Adrenals/Urinary Tract: Normal left adrenal gland. 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 Mild renal cortical thinning bilaterally with nonobstructing  right lower pole renal calculus. Faint calcifications of the left kidney are also noted without obstruction. No hydroureteronephrosis. The urinary bladder is nondistended the partially obscured by streak artifacts the patient's hip arthroplasties. Stomach/Bowel: Nondistended stomach. Normal small bowel rotation and ligament of Treitz. Normal appendix. No acute bowel inflammation nor obstruction. Moderate stool burden in the rectum. Vascular/Lymphatic: Mild aortoiliac and branch vessel atherosclerosis. No adenopathy. Reproductive: Prostate is not enlarged in appearance but obscured by streak artifacts the patient's hip arthroplasties. Other: Small fat containing inguinal hernia. Musculoskeletal: Atrophy of the right iliopsoas. Bilateral hip arthroplasties. Lumbosacral spondylosis. IMPRESSION: 1. No acute bowel obstruction or inflammation. No evidence of pancreatitis. 2. Bilateral nephrolithiasis without obstructive uropathy. 3. 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 4. Uncomplicated cholelithiasis. 5. Hepatic cirrhosis. 6. Left main and three-vessel coronary arteriosclerosis. Electronically Signed   By: Ashley Royalty M.D.   On: 09/20/2017 23:08   Dg Chest Portable 1 View  Result Date: 09/20/2017 CLINICAL DATA:  VOMITING X 1 MONTH, HTN, A-FIB, NON SMOKER EXAM: PORTABLE CHEST 1 VIEW COMPARISON:  08/06/2017 FINDINGS: Cardiac silhouette is mildly enlarged. No mediastinal or hilar masses. Clear lungs. No convincing pleural effusion. No pneumothorax. Skeletal structures are grossly intact. IMPRESSION: No acute cardiopulmonary disease. Electronically Signed   By: Lajean Manes M.D.   On: 09/20/2017 13:40        Scheduled Meds: . allopurinol  300 mg Oral Daily  . aspirin  81 mg Oral Daily  . atorvastatin  80 mg Oral q1800  . clopidogrel  75 mg Oral Daily  . feeding supplement  1 Container Oral BID BM  . feeding supplement (PRO-STAT SUGAR FREE 64)  30 mL Oral BID  . heparin   5,000 Units Subcutaneous Q8H  . levothyroxine  50 mcg Oral QAC breakfast  . metoprolol tartrate  12.5 mg Oral BID  . multivitamin  15 mL Oral Daily  . ondansetron (ZOFRAN) IV  4 mg Intravenous Q6H  . pantoprazole (PROTONIX) IV  40 mg Intravenous Q12H   Continuous Infusions: . sodium chloride 100 mL/hr at 09/20/17 2027     LOS: 1 day    Time spent: 81mns    Jahari Billy, MD [facility] Pager [phone] If 7PM-7AM, please contact night-coverage [email or web address] Password [email or web address] 09/21/2017, 10:22 AM

## 2017-09-21 NOTE — Consult Note (Signed)
Referring Provider: No ref. provider found Primary Care Physician:  Monico Blitz, MD Primary Gastroenterologist:  Barney Drain  Reason for Consultation:  NAUSEA/VOMITING   Impression: ADMITTED WITH WORSENING NAUSEA/VOMITING/DIFFICULTY SWALLOWING. CLINICALLY IMPROVED AFTER EGD/DIL AND SYMPTOMS HAVE RETURNED.  Plan: 1. SUPPORTIVE CARE 2. NEEDS AGGRESSIVE HYDRATION FOR BP SUPPORT AND TO HELP KIDNEY RECOVER. 3. PLAN EGD/ESOPHAGEAL DILATION NOV 5 W/ FENTANYL. DISCUSSED PROCEDURE, BENEFITS, & RISKS: < 1% chance of medication reaction, PERFORATION, OR bleeding. OK TO CONTINUE ASA/PLAVIX. WILL HOLD HEPARIN ON AM OF PROCEDURE. 4. CRUSH PILLS IF POSSIBLE OR CHANGE TO IV. 5. PT SHOULD REMAIN ON CLEAR LIQUIDS OR FULL LIQUIDS UNTIL EGD COMPLETE.      HPI:  Did well after going home. Over past week developed worsening nausea, vomiting, couldn't even stand smell of food. HAVING DIFFICULTY SWALLOWING: CAN'T EAT OATMEAL. COULD EAT COLD PORK AND BEANS. WEIGHT DIWN FROM 279 LBS JUN 2018 TO 235 LBS OCT 2018. NO BM IN ONE WEEK. FEELS COLD SOMETIMES.   PT DENIES FEVER, CHILLS, HEMATOCHEZIA, HEMATEMESIS, nausea, vomiting, melena, diarrhea, CHEST PAIN, SHORTNESS OF BREATH, CHANGE IN BOWEL IN HABITS, constipation, abdominal pain, problems with sedation, heartburn or indigestion.   Past Medical History:  Diagnosis Date  . Adenomatous polyp of colon   . Alcoholism ([place])    [place]  . Arthritis   . Atrial fibrillation ([place])   . Blindness of right eye 1997  . CAD (coronary artery disease)    Multivessel disease at cardiac catheterization June 2018, DES to LAD June 2018 with significant residual disease including diagonal and obtuse marginal vessels as well as RCA  . Cirrhosis of liver (HCC)    Associated portal gastropathy  . CKD (chronic kidney disease) stage 3, GFR 30-59 ml/min (HCC)   . Essential hypertension   . Hiatal hernia   . Hypothyroidism   . 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 TIA  (transient ischemic attack)     Past Surgical History:  Procedure Laterality Date  . CARDIAC CATHETERIZATION  2003  . COLONOSCOPY  08/2008   normal, repeat exam in 5-7 years  . COLONOSCOPY  2004   rectal adenomatous polyp  . COLONOSCOPY N/A 12/01/2014   MCN:[id number] rectum/elongated colon  . CORONARY ATHERECTOMY N/A 05/03/2017   Procedure: Coronary Atherectomy;  Surgeon: Leonie Man, MD;  Location: [facility];  Service: Cardiovascular;  Laterality: N/A;  . CORONARY STENT INTERVENTION N/A 05/03/2017   Procedure: Coronary Stent Intervention;  Surgeon: Leonie Man, MD;  Location: [facility];  Service: Cardiovascular;  Laterality: N/A;  . ESOPHAGEAL DILATION N/A 02/07/2015   Procedure: ESOPHAGEAL DILATION;  Surgeon: Daneil Dolin, MD;  [facility];  Service: Endoscopy;  Laterality: N/A;  . ESOPHAGEAL DILATION N/A 08/17/2017   Procedure: ESOPHAGEAL DILATION;  Surgeon: Danie Binder, MD;  [facility];  Service: Endoscopy;  Laterality: N/A;  . ESOPHAGOGASTRODUODENOSCOPY  02/11/2012   Dr. Gala Romney: portal gastropathy, gastric erosions, esophageal ulcerations likely pill-induced, surveillance in 2 years  . ESOPHAGOGASTRODUODENOSCOPY N/A 12/01/2014   Dr. Volney American esophageal stricture dilated with the scope passage, portal gastropathy, negative H.pylori on gastric biopsies, esophageal biopsies benign  . ESOPHAGOGASTRODUODENOSCOPY N/A 02/07/2015   Procedure: ESOPHAGOGASTRODUODENOSCOPY (EGD);  Surgeon: Daneil Dolin, MD;  [facility];  Service: Endoscopy;  Laterality: N/A;  1115  . ESOPHAGOGASTRODUODENOSCOPY N/A 08/17/2017   Procedure: ESOPHAGOGASTRODUODENOSCOPY (EGD);  Surgeon: Danie Binder, MD;  [facility];  Service: Endoscopy;  Laterality: N/A;  . EYE SURGERY    .  JOINT REPLACEMENT    . LEFT HEART CATH AND CORONARY ANGIOGRAPHY N/A 05/01/2017   Procedure: Left Heart Cath and Coronary Angiography;  Surgeon: Leonie Man,  MD;  Location: [facility];  Service: Cardiovascular;  Laterality: N/A;  . s/p eye implant  1997   artificial eye, right  . TOTAL HIP ARTHROPLASTY  2002  . TOTAL HIP ARTHROPLASTY  2006/2012   revision in 2012  . TOTAL KNEE ARTHROPLASTY  1999/2003   left/right    Prior to Admission medications   Medication Sig Start Date End Date Taking? Authorizing Provider  allopurinol (ZYLOPRIM) 100 MG tablet Take 1 tablet (100 mg total) by mouth daily. Patient taking differently: Take 300 mg by mouth daily.  08/19/17  Yes TatShanon Brow, MD  aspirin 81 MG chewable tablet Chew 81 mg by mouth daily.   Yes [provider]  atorvastatin (LIPITOR) 80 MG tablet Take 80 mg by mouth daily at 6 PM.  09/12/17  Yes [provider]  calcium carbonate (TUMS - DOSED IN MG ELEMENTAL CALCIUM) 500 MG chewable tablet Chew 1 tablet by mouth 2 (two) times daily.   Yes [provider]  clopidogrel (PLAVIX) 75 MG tablet Take 1 tablet (75 mg total) by mouth daily. 05/07/17  Yes Barton Dubois, MD  Ergocalciferol (VITAMIN D2) 400 units TABS Take 1 tablet by mouth 2 (two) times daily.   Yes [provider]  famotidine (PEPCID) 20 MG tablet Take 1 tablet (20 mg total) by mouth daily. 08/18/17  Yes Tat, Shanon Brow, MD  feeding supplement (BOOST / RESOURCE BREEZE) LIQD Take 1 Container by mouth 4 (four) times daily. 08/18/17  Yes Tat, Shanon Brow, MD  folic acid (FOLVITE) 1 MG tablet Take 1 tablet (1 mg total) by mouth daily. 05/07/17  Yes Barton Dubois, MD  levothyroxine (SYNTHROID, LEVOTHROID) 50 MCG tablet Take 50 mcg by mouth daily before breakfast.  09/12/17  Yes [provider]  Melatonin 3 MG TABS Take 1 tablet by mouth at bedtime.   Yes [provider]  metoprolol tartrate (LOPRESSOR) 25 MG tablet Take 0.5 tablets (12.5 mg total) by mouth 2 (two) times daily. 08/18/17  Yes Tat, Shanon Brow, MD  omeprazole (PRILOSEC) 40 MG capsule Take 1 capsule (40 mg total) by mouth 2 (two) times daily.  03/12/16  Yes Carlis Stable, NP  oxazepam (SERAX) 10 MG capsule Take 10 mg by mouth at bedtime as needed for sleep or anxiety.  09/13/17  Yes [provider]  traMADol (ULTRAM) 50 MG tablet Take 1 tablet (50 mg total) by mouth every 6 (six) hours as needed. 08/18/17  Yes Tat, Shanon Brow, MD  vitamin C (ASCORBIC ACID) 500 MG tablet Take 500 mg by mouth daily.   Yes [provider]    Current Facility-Administered Medications  Medication Dose Route Frequency Provider Last Rate Last Dose  . 0.9 %  sodium chloride infusion   Intravenous Continuous Kathie Dike, MD 100 mL/hr at 09/20/17 2027    . acetaminophen (TYLENOL) tablet 650 mg  650 mg Oral Q6H PRN Kathie Dike, MD       Or  . acetaminophen (TYLENOL) suppository 650 mg  650 mg Rectal Q6H PRN Kathie Dike, MD      . allopurinol (ZYLOPRIM) tablet 300 mg  300 mg Oral Daily Kathie Dike, MD   300 mg at 09/21/17 0925  . aspirin chewable tablet 81 mg  81 mg Oral Daily Kathie Dike, MD   81 mg at 09/21/17 0925  .  atorvastatin (LIPITOR) tablet 80 mg  80 mg Oral q1800 Kathie Dike, MD   80 mg at 09/20/17 2123  . clopidogrel (PLAVIX) tablet 75 mg  75 mg Oral Daily Kathie Dike, MD   75 mg at 09/21/17 0925  . feeding supplement (BOOST / RESOURCE BREEZE) liquid 1 Container  1 Container Oral BID BM Memon, Jolaine Artist, MD      . feeding supplement (PRO-STAT SUGAR FREE 64) liquid 30 mL  30 mL Oral BID Kathie Dike, MD      . heparin injection 5,000 Units  5,000 Units Subcutaneous Q8H Kathie Dike, MD   5,000 Units at 09/21/17 0521  . levothyroxine (SYNTHROID, LEVOTHROID) tablet 50 mcg  50 mcg Oral QAC breakfast Kathie Dike, MD   50 mcg at 09/21/17 0925  . metoprolol tartrate (LOPRESSOR) tablet 12.5 mg  12.5 mg Oral BID Kathie Dike, MD      . multivitamin liquid 15 mL  15 mL Oral Daily Kathie Dike, MD      . ondansetron (ZOFRAN) injection 4 mg  4 mg Intravenous Q6H Kathie Dike, MD   4 mg at 09/21/17 0521  .  ondansetron (ZOFRAN) tablet 4 mg  4 mg Oral Q6H PRN Kathie Dike, MD       Or  . ondansetron (ZOFRAN) injection 4 mg  4 mg Intravenous Q6H PRN Kathie Dike, MD      . pantoprazole (PROTONIX) injection 40 mg  40 mg Intravenous Q12H Kathie Dike, MD   40 mg at 09/21/17 0928  . traMADol (ULTRAM) tablet 50 mg  50 mg Oral Q6H PRN Kathie Dike, MD        Allergies as of 09/20/2017  . (No Known Allergies)    Family History  Problem Relation Age of Onset  . Ovarian cancer Sister   . Colon cancer Neg Hx      Social History   Social History  . Marital status: Divorced    Spouse name: N/A  . Number of children: N/A  . Years of education: N/A   Occupational History  . Not on file.   Social History Main Topics  . Smoking status: Never Smoker  . Smokeless tobacco: Never Used     Comment: used to chew tobacco, none in 15 years  . Alcohol use 0.0 oz/week     Comment: no alcohol at present-in nursing home; half a gallon moonshine per week  . Drug use: No  . Sexual activity: Not Currently   Other Topics Concern  . Not on file   Social History Narrative   One son deceased while in prison, drug addiction.    Review of Systems: PER HPI OTHERWISE ALL SYSTEMS ARE NEGATIVE.   Vitals: Blood pressure (!) 82/56, pulse 93, temperature 98.1 F (36.7 C), temperature source Axillary, resp. rate 16, height 5' 9"  (1.753 m), weight 235 lb 14.3 oz (107 kg), SpO2 92 %.  Physical Exam: General:   Alert,  Well-developed, well-nourished, pleasant and cooperative in NAD Head:  Normocephalic and atraumatic. Eyes:  Sclera clear, no icterus.   Conjunctiva pink. Mouth:  No lesions, dentition ABnormal. Neck:  Supple; no masses. Lungs:  Clear throughout to auscultation.   No wheezes. No acute distress. Heart:  Regular rate and rhythm; no murmurs. Abdomen:  Soft, nontender and nondistended. No masses noted. OBESE, Normal bowel sounds, without guarding, and without rebound.   Msk:   Symmetrical, ABNormal posture. Extremities:  TRACE edema BILATERAL LOWER EXTERMITIES Neurologic:  Alert and  oriented x4; NO  NEW FOCAL DEFICITS Cervical Nodes:  No significant cervical adenopathy. Psych:  Alert and cooperative. Normal mood and affect.   Lab Results:  Recent Labs  09/20/17 1224 09/21/17 0357  WBC 12.3* 7.6  HGB 13.9 11.5*  HCT 43.4 35.9*  PLT 179 135*   BMET  Recent Labs  09/20/17 1224 09/21/17 0357  NA 136 137  K 4.0 3.4*  CL 94* 98*  CO2 23 28  GLUCOSE 116* 85  BUN 44* 40*  CREATININE 3.89* 3.26*  CALCIUM 9.9 9.2   LFT  Recent Labs  09/20/17 1224  PROT 6.8  ALBUMIN 3.4*  AST 32  ALT 15*  ALKPHOS 95  BILITOT 1.4*     Studies/Results: CT ABD/PELVIS OCT 2: NAIAP, GALLSTONES, CIRRHOSIS, ATROPHIC PANCREAS   LOS: 1 day   Justin Parsons  09/21/2017, 10:26 AM

## 2017-09-21 NOTE — H&P (View-Only) (Signed)
Referring Provider: No ref. provider found Primary Care Physician:  Monico Blitz, MD Primary Gastroenterologist:  Barney Drain  Reason for Consultation:  NAUSEA/VOMITING   Impression: ADMITTED WITH WORSENING NAUSEA/VOMITING/DIFFICULTY SWALLOWING. CLINICALLY IMPROVED AFTER EGD/DIL AND SYMPTOMS HAVE RETURNED.  Plan: 1. SUPPORTIVE CARE 2. NEEDS AGGRESSIVE HYDRATION FOR BP SUPPORT AND TO HELP KIDNEY RECOVER. 3. PLAN EGD/ESOPHAGEAL DILATION NOV 5 W/ FENTANYL. DISCUSSED PROCEDURE, BENEFITS, & RISKS: < 1% chance of medication reaction, PERFORATION, OR bleeding. OK TO CONTINUE ASA/PLAVIX. WILL HOLD HEPARIN ON AM OF PROCEDURE. 4. CRUSH PILLS IF POSSIBLE OR CHANGE TO IV. 5. PT SHOULD REMAIN ON CLEAR LIQUIDS OR FULL LIQUIDS UNTIL EGD COMPLETE.      HPI:  Did well after going home. Over past week developed worsening nausea, vomiting, couldn't even stand smell of food. HAVING DIFFICULTY SWALLOWING: CAN'T EAT OATMEAL. COULD EAT COLD PORK AND BEANS. WEIGHT DIWN FROM 279 LBS JUN 2018 TO 235 LBS OCT 2018. NO BM IN ONE WEEK. FEELS COLD SOMETIMES.   PT DENIES FEVER, CHILLS, HEMATOCHEZIA, HEMATEMESIS, nausea, vomiting, melena, diarrhea, CHEST PAIN, SHORTNESS OF BREATH, CHANGE IN BOWEL IN HABITS, constipation, abdominal pain, problems with sedation, heartburn or indigestion.   Past Medical History:  Diagnosis Date  . Adenomatous polyp of colon   . Alcoholism ([place])    [place]  . Arthritis   . Atrial fibrillation ([place])   . Blindness of right eye 1997  . CAD (coronary artery disease)    Multivessel disease at cardiac catheterization June 2018, DES to LAD June 2018 with significant residual disease including diagonal and obtuse marginal vessels as well as RCA  . Cirrhosis of liver (HCC)    Associated portal gastropathy  . CKD (chronic kidney disease) stage 3, GFR 30-59 ml/min (HCC)   . Essential hypertension   . Hiatal hernia   . Hypothyroidism   . 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 TIA  (transient ischemic attack)     Past Surgical History:  Procedure Laterality Date  . CARDIAC CATHETERIZATION  2003  . COLONOSCOPY  08/2008   normal, repeat exam in 5-7 years  . COLONOSCOPY  2004   rectal adenomatous polyp  . COLONOSCOPY N/A 12/01/2014   OIN:OMVEHM rectum/elongated colon  . CORONARY ATHERECTOMY N/A 05/03/2017   Procedure: Coronary Atherectomy;  Surgeon: Leonie Man, MD;  Location: [facility];  Service: Cardiovascular;  Laterality: N/A;  . CORONARY STENT INTERVENTION N/A 05/03/2017   Procedure: Coronary Stent Intervention;  Surgeon: Leonie Man, MD;  Location: [facility];  Service: Cardiovascular;  Laterality: N/A;  . ESOPHAGEAL DILATION N/A 02/07/2015   Procedure: ESOPHAGEAL DILATION;  Surgeon: Daneil Dolin, MD;  [facility];  Service: Endoscopy;  Laterality: N/A;  . ESOPHAGEAL DILATION N/A 08/17/2017   Procedure: ESOPHAGEAL DILATION;  Surgeon: Danie Binder, MD;  [facility];  Service: Endoscopy;  Laterality: N/A;  . ESOPHAGOGASTRODUODENOSCOPY  02/11/2012   Dr. Gala Romney: portal gastropathy, gastric erosions, esophageal ulcerations likely pill-induced, surveillance in 2 years  . ESOPHAGOGASTRODUODENOSCOPY N/A 12/01/2014   Dr. Volney American esophageal stricture dilated with the scope passage, portal gastropathy, negative H.pylori on gastric biopsies, esophageal biopsies benign  . ESOPHAGOGASTRODUODENOSCOPY N/A 02/07/2015   Procedure: ESOPHAGOGASTRODUODENOSCOPY (EGD);  Surgeon: Daneil Dolin, MD;  [facility];  Service: Endoscopy;  Laterality: N/A;  1115  . ESOPHAGOGASTRODUODENOSCOPY N/A 08/17/2017   Procedure: ESOPHAGOGASTRODUODENOSCOPY (EGD);  Surgeon: Danie Binder, MD;  [facility];  Service: Endoscopy;  Laterality: N/A;  . EYE SURGERY    .  JOINT REPLACEMENT    . LEFT HEART CATH AND CORONARY ANGIOGRAPHY N/A 05/01/2017   Procedure: Left Heart Cath and Coronary Angiography;  Surgeon: Leonie Man,  MD;  Location: [facility];  Service: Cardiovascular;  Laterality: N/A;  . s/p eye implant  1997   artificial eye, right  . TOTAL HIP ARTHROPLASTY  2002  . TOTAL HIP ARTHROPLASTY  2006/2012   revision in 2012  . TOTAL KNEE ARTHROPLASTY  1999/2003   left/right    Prior to Admission medications   Medication Sig Start Date End Date Taking? Authorizing Provider  allopurinol (ZYLOPRIM) 100 MG tablet Take 1 tablet (100 mg total) by mouth daily. Patient taking differently: Take 300 mg by mouth daily.  08/19/17  Yes TatShanon Brow, MD  aspirin 81 MG chewable tablet Chew 81 mg by mouth daily.   Yes [provider]  atorvastatin (LIPITOR) 80 MG tablet Take 80 mg by mouth daily at 6 PM.  09/12/17  Yes [provider]  calcium carbonate (TUMS - DOSED IN MG ELEMENTAL CALCIUM) 500 MG chewable tablet Chew 1 tablet by mouth 2 (two) times daily.   Yes [provider]  clopidogrel (PLAVIX) 75 MG tablet Take 1 tablet (75 mg total) by mouth daily. 05/07/17  Yes Barton Dubois, MD  Ergocalciferol (VITAMIN D2) 400 units TABS Take 1 tablet by mouth 2 (two) times daily.   Yes [provider]  famotidine (PEPCID) 20 MG tablet Take 1 tablet (20 mg total) by mouth daily. 08/18/17  Yes Tat, Shanon Brow, MD  feeding supplement (BOOST / RESOURCE BREEZE) LIQD Take 1 Container by mouth 4 (four) times daily. 08/18/17  Yes Tat, Shanon Brow, MD  folic acid (FOLVITE) 1 MG tablet Take 1 tablet (1 mg total) by mouth daily. 05/07/17  Yes Barton Dubois, MD  levothyroxine (SYNTHROID, LEVOTHROID) 50 MCG tablet Take 50 mcg by mouth daily before breakfast.  09/12/17  Yes [provider]  Melatonin 3 MG TABS Take 1 tablet by mouth at bedtime.   Yes [provider]  metoprolol tartrate (LOPRESSOR) 25 MG tablet Take 0.5 tablets (12.5 mg total) by mouth 2 (two) times daily. 08/18/17  Yes Tat, Shanon Brow, MD  omeprazole (PRILOSEC) 40 MG capsule Take 1 capsule (40 mg total) by mouth 2 (two) times daily.  03/12/16  Yes Carlis Stable, NP  oxazepam (SERAX) 10 MG capsule Take 10 mg by mouth at bedtime as needed for sleep or anxiety.  09/13/17  Yes [provider]  traMADol (ULTRAM) 50 MG tablet Take 1 tablet (50 mg total) by mouth every 6 (six) hours as needed. 08/18/17  Yes Tat, Shanon Brow, MD  vitamin C (ASCORBIC ACID) 500 MG tablet Take 500 mg by mouth daily.   Yes [provider]    Current Facility-Administered Medications  Medication Dose Route Frequency Provider Last Rate Last Dose  . 0.9 %  sodium chloride infusion   Intravenous Continuous Kathie Dike, MD 100 mL/hr at 09/20/17 2027    . acetaminophen (TYLENOL) tablet 650 mg  650 mg Oral Q6H PRN Kathie Dike, MD       Or  . acetaminophen (TYLENOL) suppository 650 mg  650 mg Rectal Q6H PRN Kathie Dike, MD      . allopurinol (ZYLOPRIM) tablet 300 mg  300 mg Oral Daily Kathie Dike, MD   300 mg at 09/21/17 0925  . aspirin chewable tablet 81 mg  81 mg Oral Daily Kathie Dike, MD   81 mg at 09/21/17 0925  .  atorvastatin (LIPITOR) tablet 80 mg  80 mg Oral q1800 Kathie Dike, MD   80 mg at 09/20/17 2123  . clopidogrel (PLAVIX) tablet 75 mg  75 mg Oral Daily Kathie Dike, MD   75 mg at 09/21/17 0925  . feeding supplement (BOOST / RESOURCE BREEZE) liquid 1 Container  1 Container Oral BID BM Memon, Jolaine Artist, MD      . feeding supplement (PRO-STAT SUGAR FREE 64) liquid 30 mL  30 mL Oral BID Kathie Dike, MD      . heparin injection 5,000 Units  5,000 Units Subcutaneous Q8H Kathie Dike, MD   5,000 Units at 09/21/17 0521  . levothyroxine (SYNTHROID, LEVOTHROID) tablet 50 mcg  50 mcg Oral QAC breakfast Kathie Dike, MD   50 mcg at 09/21/17 0925  . metoprolol tartrate (LOPRESSOR) tablet 12.5 mg  12.5 mg Oral BID Kathie Dike, MD      . multivitamin liquid 15 mL  15 mL Oral Daily Kathie Dike, MD      . ondansetron (ZOFRAN) injection 4 mg  4 mg Intravenous Q6H Kathie Dike, MD   4 mg at 09/21/17 0521  .  ondansetron (ZOFRAN) tablet 4 mg  4 mg Oral Q6H PRN Kathie Dike, MD       Or  . ondansetron (ZOFRAN) injection 4 mg  4 mg Intravenous Q6H PRN Kathie Dike, MD      . pantoprazole (PROTONIX) injection 40 mg  40 mg Intravenous Q12H Kathie Dike, MD   40 mg at 09/21/17 0928  . traMADol (ULTRAM) tablet 50 mg  50 mg Oral Q6H PRN Kathie Dike, MD        Allergies as of 09/20/2017  . (No Known Allergies)    Family History  Problem Relation Age of Onset  . Ovarian cancer Sister   . Colon cancer Neg Hx      Social History   Social History  . Marital status: Divorced    Spouse name: N/A  . Number of children: N/A  . Years of education: N/A   Occupational History  . Not on file.   Social History Main Topics  . Smoking status: Never Smoker  . Smokeless tobacco: Never Used     Comment: used to chew tobacco, none in 15 years  . Alcohol use 0.0 oz/week     Comment: no alcohol at present-in nursing home; half a gallon moonshine per week  . Drug use: No  . Sexual activity: Not Currently   Other Topics Concern  . Not on file   Social History Narrative   One son deceased while in prison, drug addiction.    Review of Systems: PER HPI OTHERWISE ALL SYSTEMS ARE NEGATIVE.   Vitals: Blood pressure (!) 82/56, pulse 93, temperature 98.1 F (36.7 C), temperature source Axillary, resp. rate 16, height 5' 9"  (1.753 m), weight 235 lb 14.3 oz (107 kg), SpO2 92 %.  Physical Exam: General:   Alert,  Well-developed, well-nourished, pleasant and cooperative in NAD Head:  Normocephalic and atraumatic. Eyes:  Sclera clear, no icterus.   Conjunctiva pink. Mouth:  No lesions, dentition ABnormal. Neck:  Supple; no masses. Lungs:  Clear throughout to auscultation.   No wheezes. No acute distress. Heart:  Regular rate and rhythm; no murmurs. Abdomen:  Soft, nontender and nondistended. No masses noted. OBESE, Normal bowel sounds, without guarding, and without rebound.   Msk:   Symmetrical, ABNormal posture. Extremities:  TRACE edema BILATERAL LOWER EXTERMITIES Neurologic:  Alert and  oriented x4; NO  NEW FOCAL DEFICITS Cervical Nodes:  No significant cervical adenopathy. Psych:  Alert and cooperative. Normal mood and affect.   Lab Results:  Recent Labs  09/20/17 1224 09/21/17 0357  WBC 12.3* 7.6  HGB 13.9 11.5*  HCT 43.4 35.9*  PLT 179 135*   BMET  Recent Labs  09/20/17 1224 09/21/17 0357  NA 136 137  K 4.0 3.4*  CL 94* 98*  CO2 23 28  GLUCOSE 116* 85  BUN 44* 40*  CREATININE 3.89* 3.26*  CALCIUM 9.9 9.2   LFT  Recent Labs  09/20/17 1224  PROT 6.8  ALBUMIN 3.4*  AST 32  ALT 15*  ALKPHOS 95  BILITOT 1.4*     Studies/Results: CT ABD/PELVIS OCT 2: NAIAP, GALLSTONES, CIRRHOSIS, ATROPHIC PANCREAS   LOS: 1 day   Justin Parsons  09/21/2017, 10:26 AM

## 2017-09-21 NOTE — Progress Notes (Signed)
Initial Nutrition Assessment  DOCUMENTATION CODES:  Obesity unspecified  INTERVENTION:  Boost Breeze po BID, each supplement provides 250 kcal and 9 grams of protein  Will order 30 mL Prostat BID, each supplement provides 100 kcal and 15 grams of protein.  Liquid MVI with minerals.   NUTRITION DIAGNOSIS:  Inadequate oral intake related to inability to eat, nausea, vomiting, altered GI function (Esophageal Stenosis) as evidenced by loss of 14.5% body weight in <5 months  GOAL:  Patient will meet greater than or equal to 90% of their needs  MONITOR:  Supplement acceptance, PO intake, Labs, Diet advancement, I & O's  REASON FOR ASSESSMENT:  Malnutrition Screening Tool    ASSESSMENT:  76 y/o male PMHx Afib, CKDIII, Gout, Esophageal strictures, Cirrhosis, HTN, etoh abuse, CAD, MI.. Admitted 9/27-10/1 for intractable N/V. Worked up for delayed gastric emptying as well as stenosis of esophagus and gastric pylorus s/p dilation of both these areas.Discharged to SNF. After leaving SNF, Began feeling unwell and has had increased n/v and reduced tolerance to PO intake. Reports no BM x1 week. Went to outpatient GI appt and began vomiting in waiting room. Sent to ED for eval and admitted due to concern for recurrence of stricture.   RD operating remotely. Per review of chart, the patient has been "unable to keep anything down" . He has reported n/v x 2-3 weeks vs 1 month and says he will vomit anything he swallows. He was dehydrated and septic in the ED. GI has been consulted to reassess patient.   Per review of weights. The patient was 275- 280 lbs this past June. He is now 235 lbs. This is a clinically significant loss of ~40 lbs (14.5% bw).  With patients significant weight loss and intake history, patient meets criteria for severe malnutrition, but because unable to see patient, cannot place diagnose at this time.   Physical Exam: Unable to assess  At this time, patient is on a clear liquid  diet, will order supplements appropriate for this diet as well as a liquid MVI  Labs: BUN/Creat: 40/3.26, Albumin: 3.4, WBC:12.3 Meds: PPI, Zofran, IVF   Recent Labs Lab 09/20/17 1224 09/21/17 0357  NA 136 137  K 4.0 3.4*  CL 94* 98*  CO2 23 28  BUN 44* 40*  CREATININE 3.89* 3.26*  CALCIUM 9.9 9.2  GLUCOSE 116* 85   NUTRITION - FOCUSED PHYSICAL EXAM: Unable to assess  Diet Order:  Diet clear liquid Room service appropriate? Yes; Fluid consistency: Thin  EDUCATION NEEDS:  No education needs have been identified at this time  Skin: Abrasion to L knee  Last BM:  10/26  Height:  Ht Readings from Last 1 Encounters:  09/20/17 5' 9"  (1.753 m)   Weight:  Wt Readings from Last 1 Encounters:  09/21/17 235 lb 14.3 oz (107 kg)   Wt Readings from Last 10 Encounters:  09/21/17 235 lb 14.3 oz (107 kg)  08/17/17 245 lb 2.4 oz (111.2 kg)  05/06/17 277 lb 8 oz (125.9 kg)  01/20/15 282 lb 12.8 oz (128.3 kg)  12/01/14 300 lb (136.1 kg)  11/04/14 295 lb 9.6 oz (134.1 kg)  11/17/13 280 lb 10.3 oz (127.3 kg)  06/05/12 289 lb 0.4 oz (131.1 kg)  02/11/12 (!) 305 lb (138.3 kg)  01/28/12 (!) 305 lb 9.6 oz (138.6 kg)   Ideal Body Weight:  72.73 kg  BMI:  Body mass index is 34.84 kg/m.  Estimated Nutritional Needs:  Kcal:  4235-3614 (17-19 kcals/kg bw) Protein:  80-95g Pro (1.1-1.3g/kg ibw) Fluid:  1.8-2 Liters fluid  Burtis Junes RD, LDN, CNSC Clinical Nutrition Pager: [phone] 09/21/2017 9:54 AM

## 2017-09-22 ENCOUNTER — Encounter (HOSPITAL_COMMUNITY): Payer: Self-pay | Admitting: Gastroenterology

## 2017-09-22 ENCOUNTER — Other Ambulatory Visit: Payer: Self-pay

## 2017-09-22 DIAGNOSIS — Z8719 Personal history of other diseases of the digestive system: Secondary | ICD-10-CM

## 2017-09-22 DIAGNOSIS — I959 Hypotension, unspecified: Secondary | ICD-10-CM

## 2017-09-22 LAB — CBC
HEMATOCRIT: 35.3 % — AB (ref 39.0–52.0)
HEMOGLOBIN: 11.2 g/dL — AB (ref 13.0–17.0)
MCH: 28.2 pg (ref 26.0–34.0)
MCHC: 31.7 g/dL (ref 30.0–36.0)
MCV: 88.9 fL (ref 78.0–100.0)
Platelets: 132 10*3/uL — ABNORMAL LOW (ref 150–400)
RBC: 3.97 MIL/uL — AB (ref 4.22–5.81)
RDW: 19.1 % — ABNORMAL HIGH (ref 11.5–15.5)
WBC: 6.5 10*3/uL (ref 4.0–10.5)

## 2017-09-22 LAB — BASIC METABOLIC PANEL
ANION GAP: 9 (ref 5–15)
BUN: 32 mg/dL — ABNORMAL HIGH (ref 6–20)
CHLORIDE: 103 mmol/L (ref 101–111)
CO2: 26 mmol/L (ref 22–32)
Calcium: 8.9 mg/dL (ref 8.9–10.3)
Creatinine, Ser: 2.68 mg/dL — ABNORMAL HIGH (ref 0.61–1.24)
GFR calc non Af Amer: 21 mL/min — ABNORMAL LOW (ref >60)
GFR, EST AFRICAN AMERICAN: 25 mL/min — AB (ref >60)
Glucose, Bld: 98 mg/dL (ref 65–99)
Potassium: 3.3 mmol/L — ABNORMAL LOW (ref 3.5–5.1)
Sodium: 138 mmol/L (ref 135–145)

## 2017-09-22 MED ORDER — MILK AND MOLASSES ENEMA
1.0000 | Freq: Once | RECTAL | Status: AC
  Administered 2017-09-22: 250 mL via RECTAL

## 2017-09-22 MED ORDER — POTASSIUM CHLORIDE 20 MEQ PO PACK
40.0000 meq | PACK | Freq: Once | ORAL | Status: AC
  Administered 2017-09-22: 40 meq via ORAL
  Filled 2017-09-22 (×2): qty 2

## 2017-09-22 NOTE — Progress Notes (Signed)
PROGRESS NOTE    Justin Parsons  LYY:[id number] DOB: 06/04/1941 DOA: 09/20/2017 PCP: Monico Blitz, MD    Brief Narrative:  76 year old male with a history of chronic atrial fibrillation, chronic kidney disease stage III, esophageal and pyloric strictures status post dilation in 07/2017, presents to the hospital with complaints of intractable nausea and vomiting for the past 2-3 weeks.  He presented with dehydration and acute on chronic kidney injury.  Reports he is unable to keep anything down by mouth.  He was sent to the ED from gastroenterology office.  There was concern that he may need another esophageal dilatation.  The patient is currently on IV hydration for dehydration.  Diet is being advanced.  He is tolerating clear liquids.  GI has been consulted.   Assessment & Plan:   Active Problems:   GERD (gastroesophageal reflux disease)   Cirrhosis (HCC)   History of esophageal stricture   Acute renal failure superimposed on stage 3 chronic kidney disease (HCC)   Dehydration   AKI (acute kidney injury) ([place])   Gastric outlet obstruction   Intractable vomiting   Atrial fibrillation with RVR ([place])   1. Intractable nausea and vomiting.  Possibly related to recurrent esophageal stricture.    Appears to be tolerating full liquids.  Continue scheduled Zofran.  GI following and plans EGD on 11/5. 2. Dehydration.  Improving with hydration 3. Acute renal failure superimposed on stage III chronic kidney disease.  Likely related to dehydration and volume depletion.    Baseline creatinine approximately 1.8.  Presents with a creatinine of 3.8.  Creatinine has improved to 2.6 today.  Continue IV fluids for now. 4. GERD.  Continue on PPI 5. Atrial fibrillation with rapid ventricular response.  Likely related to dehydration.    Heart rate was better controlled with hydration and Lopressor.  Not felt to be a good candidate for anticoagulation due to his portal gastropathy.  He is continued on dual  anti-platelet therapy. 6. Alcoholic cirrhosis.  Appears to be compensated at this time.  Follow-up with GI as an outpatient. 7. Coronary artery disease status post stenting in the past.  No complaints of chest pain.  Continue on dual antiplatelet therapy.   DVT prophylaxis: Heparin Code Status: Full code Family Communication: No family present Disposition Plan: Discharge home once improved   Consultants:   Gastroenterology  Procedures:     Antimicrobials:       Subjective: Had some nausea yesterday, but no vomiting. No abdominal pain. Has not had a bowel movement in a week.  Objective: Vitals:   09/22/17 0300 09/22/17 0400 09/22/17 0500 09/22/17 0600  BP: 90/69 (!) 82/62 103/72 95/77  Pulse: 88 83 92 81  Resp: 16 14 (!) 22 15  Temp:  98.5 F (36.9 C)    TempSrc:  Oral    SpO2: 94% 94% 94% 94%  Weight:   106.8 kg (235 lb 7.2 oz)   Height:        Intake/Output Summary (Last 24 hours) at 09/22/2017 0859 Last data filed at 09/22/2017 0600 Gross per 24 hour  Intake 3500 ml  Output 600 ml  Net 2900 ml   Filed Weights   09/20/17 1842 09/21/17 0500 09/22/17 0500  Weight: 105.4 kg (232 lb 5.8 oz) 107 kg (235 lb 14.3 oz) 106.8 kg (235 lb 7.2 oz)    Examination:  General exam: Appears calm and comfortable  Respiratory system: Clear to auscultation. Respiratory effort normal. Cardiovascular system: S1 & S2 heard, irregular. No  JVD, murmurs, rubs, gallops or clicks. No pedal edema. Gastrointestinal system: Abdomen is nondistended, soft and nontender. No organomegaly or masses felt. Normal bowel sounds heard. Central nervous system: Alert and oriented. No focal neurological deficits. Extremities: Symmetric 5 x 5 power. Skin: No rashes, lesions or ulcers Psychiatry: Judgement and insight appear normal. Mood & affect appropriate.     Data Reviewed: I have personally reviewed following labs and imaging studies  CBC: Recent Labs  Lab 09/20/17 1224 09/21/17 0357    WBC 12.3* 7.6  NEUTROABS 8.3*  --   HGB 13.9 11.5*  HCT 43.4 35.9*  MCV 88.0 88.0  PLT 179 017*   Basic Metabolic Panel: Recent Labs  Lab 09/20/17 1224 09/21/17 0357  NA 136 137  K 4.0 3.4*  CL 94* 98*  CO2 23 28  GLUCOSE 116* 85  BUN 44* 40*  CREATININE 3.89* 3.26*  CALCIUM 9.9 9.2   GFR: Estimated Creatinine Clearance: 22.8 mL/min (A) (by C-G formula based on SCr of 3.26 mg/dL (H)). Liver Function Tests: Recent Labs  Lab 09/20/17 1224  AST 32  ALT 15*  ALKPHOS 95  BILITOT 1.4*  PROT 6.8  ALBUMIN 3.4*   No results for input(s): LIPASE, AMYLASE in the last 168 hours. No results for input(s): AMMONIA in the last 168 hours. Coagulation Profile: Recent Labs  Lab 09/20/17 1224  INR 1.08   Cardiac Enzymes: No results for input(s): CKTOTAL, CKMB, CKMBINDEX, TROPONINI in the last 168 hours. BNP (last 3 results) No results for input(s): PROBNP in the last 8760 hours. HbA1C: No results for input(s): HGBA1C in the last 72 hours. CBG: No results for input(s): GLUCAP in the last 168 hours. Lipid Profile: No results for input(s): CHOL, HDL, LDLCALC, TRIG, CHOLHDL, LDLDIRECT in the last 72 hours. Thyroid Function Tests: Recent Labs    09/20/17 1248  TSH 2.160  FREET4 1.38*   Anemia Panel: No results for input(s): VITAMINB12, FOLATE, FERRITIN, TIBC, IRON, RETICCTPCT in the last 72 hours. Sepsis Labs: Recent Labs  Lab 09/20/17 1238 09/20/17 1419  LATICACIDVEN 4.45* 2.02*    Recent Results (from the past 240 hour(s))  Culture, blood (Routine x 2)     Status: None (Preliminary result)   Collection Time: 09/20/17 12:26 PM  Result Value Ref Range Status   Specimen Description RIGHT ANTECUBITAL  Final   Special Requests   Final    BOTTLES DRAWN AEROBIC AND ANAEROBIC Blood Culture adequate volume   Culture NO GROWTH 2 DAYS  Final   Report Status PENDING  Incomplete  Culture, blood (Routine x 2)     Status: None (Preliminary result)   Collection Time:  09/20/17 12:26 PM  Result Value Ref Range Status   Specimen Description BLOOD RIGHT WRIST  Final   Special Requests   Final    BOTTLES DRAWN AEROBIC AND ANAEROBIC Blood Culture results may not be optimal due to an inadequate volume of blood received in culture bottles   Culture NO GROWTH 2 DAYS  Final   Report Status PENDING  Incomplete  MRSA PCR Screening     Status: None   Collection Time: 09/20/17  6:39 PM  Result Value Ref Range Status   MRSA by PCR NEGATIVE NEGATIVE Final    Comment:        The GeneXpert MRSA Assay (FDA approved for NASAL specimens only), is one component of a comprehensive MRSA colonization surveillance program. It is not intended to diagnose MRSA infection nor to guide or monitor treatment for MRSA  infections.          Radiology Studies: Ct Abdomen Pelvis Wo Contrast  Result Date: 09/20/2017 CLINICAL DATA:  Nausea and vomiting times 2-3 days. EXAM: CT ABDOMEN AND PELVIS WITHOUT CONTRAST TECHNIQUE: Multidetector CT imaging of the abdomen and pelvis was performed following the standard protocol without IV contrast. COMPARISON:  04/27/2017 CT FINDINGS: Lower chest: Stable cardiomegaly without pericardial effusion. Left main and three-vessel coronary arteriosclerosis is identified as before. There is scarring and/or atelectasis in the right lower lobe. No effusion or pneumothorax. Hepatobiliary: Surface nodularity of the liver consistent with cirrhosis. Right hepatic granuloma. No biliary dilatation or space-occupying mass on this unenhanced study. Layering gallstones without complication. Pancreas: Mild pancreatic atrophy without ductal dilatation, inflammation or mass. Spleen: No splenomegaly.  No focal mass. Adrenals/Urinary Tract: Normal left adrenal gland. 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 Mild renal cortical thinning bilaterally with nonobstructing right lower pole renal calculus. Faint calcifications of the left kidney are  also noted without obstruction. No hydroureteronephrosis. The urinary bladder is nondistended the partially obscured by streak artifacts the patient's hip arthroplasties. Stomach/Bowel: Nondistended stomach. Normal small bowel rotation and ligament of Treitz. Normal appendix. No acute bowel inflammation nor obstruction. Moderate stool burden in the rectum. Vascular/Lymphatic: Mild aortoiliac and branch vessel atherosclerosis. No adenopathy. Reproductive: Prostate is not enlarged in appearance but obscured by streak artifacts the patient's hip arthroplasties. Other: Small fat containing inguinal hernia. Musculoskeletal: Atrophy of the right iliopsoas. Bilateral hip arthroplasties. Lumbosacral spondylosis. IMPRESSION: 1. No acute bowel obstruction or inflammation. No evidence of pancreatitis. 2. Bilateral nephrolithiasis without obstructive uropathy. 3. 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 4. Uncomplicated cholelithiasis. 5. Hepatic cirrhosis. 6. Left main and three-vessel coronary arteriosclerosis. Electronically Signed   By: Ashley Royalty M.D.   On: 09/20/2017 23:08   Dg Chest Portable 1 View  Result Date: 09/20/2017 CLINICAL DATA:  VOMITING X 1 MONTH, HTN, A-FIB, NON SMOKER EXAM: PORTABLE CHEST 1 VIEW COMPARISON:  08/06/2017 FINDINGS: Cardiac silhouette is mildly enlarged. No mediastinal or hilar masses. Clear lungs. No convincing pleural effusion. No pneumothorax. Skeletal structures are grossly intact. IMPRESSION: No acute cardiopulmonary disease. Electronically Signed   By: Lajean Manes M.D.   On: 09/20/2017 13:40        Scheduled Meds: . allopurinol  300 mg Oral Daily  . aspirin  81 mg Oral Daily  . atorvastatin  80 mg Oral q1800  . clopidogrel  75 mg Oral Daily  . feeding supplement  1 Container Oral BID BM  . feeding supplement (PRO-STAT SUGAR FREE 64)  30 mL Oral BID  . heparin  5,000 Units Subcutaneous Q8H  . levothyroxine  50 mcg Oral QAC breakfast   . metoprolol tartrate  12.5 mg Oral BID  . milk and molasses  1 enema Rectal Once  . multivitamin  15 mL Oral Daily  . ondansetron (ZOFRAN) IV  4 mg Intravenous Q6H  . pantoprazole (PROTONIX) IV  40 mg Intravenous Q12H   Continuous Infusions: . sodium chloride 100 mL/hr at 09/21/17 2025     LOS: 2 days    Time spent: 102mns    MEMON,JEHANZEB, MD [facility] Pager [phone] If 7PM-7AM, please contact night-coverage [email or web address] Password [email or web address] 09/22/2017, 8:58 AM

## 2017-09-22 NOTE — Progress Notes (Signed)
Had moderate bowel movement after enema

## 2017-09-22 NOTE — H&P (View-Only) (Signed)
Patient ID: Justin Parsons, male   DOB: 1940-12-29, 76 y.o.   MRN: [id number]    Assessment/Plan: Admitted with DYSPHAGIA. TOLERATING OATMEAL AND LIQUIDS. PT HAS KNOWN HISTORY OF  ESOPHAGEAL STRICTURE.  PLAN: 1. EGD/DIL NOV 5. DISCUSSED PROCEDURE, BENEFITS, & RISKS: < 1% chance of medication reaction, PERFORATION, OR bleeding. 2. CLEAR LIQUIDS AFTER 1 PM. NPO AFTER MN EXCEPT MEDS. 3. CRUSH MEDS IF POSSIBLE.      Subjective: Since I last evaluated the patient HE IS TOLERATING LIQUIDS AND OATMEAL.NO CHEST PAIN, OR NAUSEA/VOMITING/ABDOMINAL PAIN.  Objective: Vital signs in last 24 hours: Vitals:   09/22/17 0500 09/22/17 0600  BP: 103/72 95/77  Pulse: 92 81  Resp: (!) 22 15  Temp:    SpO2: 94% 94%   General appearance: alert, cooperative and no distress, RIGHT EYE CLOSED. Resp: clear to auscultation bilaterally Cardio: regular rate and rhythm GI: soft, non-tender; bowel sounds normal; no masses,  no organomegaly  Lab Results:   K 3.3 Cr 2.68N Hb 11.2 MCV 88  Studies/Results: No results found.  Medications: I have reviewed the patient's current medications.   LOS: 5 days   Barney Drain 04/29/2014, 2:23 PM

## 2017-09-22 NOTE — Progress Notes (Signed)
Patient ID: Justin Parsons, male   DOB: [birth year], 76 y.o.   MRN: [id number]    Assessment/Plan: Admitted with DYSPHAGIA. TOLERATING OATMEAL AND LIQUIDS. PT HAS KNOWN HISTORY OF  ESOPHAGEAL STRICTURE.  PLAN: 1. EGD/DIL NOV 5. DISCUSSED PROCEDURE, BENEFITS, & RISKS: < 1% chance of medication reaction, PERFORATION, OR bleeding. 2. CLEAR LIQUIDS AFTER 1 PM. NPO AFTER MN EXCEPT MEDS. 3. CRUSH MEDS IF POSSIBLE.      Subjective: Since I last evaluated the patient HE IS TOLERATING LIQUIDS AND OATMEAL.NO CHEST PAIN, OR NAUSEA/VOMITING/ABDOMINAL PAIN.  Objective: Vital signs in last 24 hours: Vitals:   09/22/17 0500 09/22/17 0600  BP: 103/72 95/77  Pulse: 92 81  Resp: (!) 22 15  Temp:    SpO2: 94% 94%   General appearance: alert, cooperative and no distress, RIGHT EYE CLOSED. Resp: clear to auscultation bilaterally Cardio: regular rate and rhythm GI: soft, non-tender; bowel sounds normal; no masses,  no organomegaly  Lab Results:   K 3.3 Cr 2.68N Hb 11.2 MCV 88  Studies/Results: No results found.  Medications: I have reviewed the patient's current medications.   LOS: 5 days   Barney Drain 04/29/2014, 2:23 PM

## 2017-09-23 ENCOUNTER — Encounter (HOSPITAL_COMMUNITY): Payer: Self-pay

## 2017-09-23 ENCOUNTER — Encounter (HOSPITAL_COMMUNITY)
Admission: EM | Disposition: A | Discharge status: Home Health | Payer: Self-pay | Source: Home / Self Care | Attending: Internal Medicine

## 2017-09-23 DIAGNOSIS — K210 Gastro-esophageal reflux disease with esophagitis: Secondary | ICD-10-CM

## 2017-09-23 DIAGNOSIS — R1310 Dysphagia, unspecified: Secondary | ICD-10-CM

## 2017-09-23 DIAGNOSIS — K2970 Gastritis, unspecified, without bleeding: Secondary | ICD-10-CM

## 2017-09-23 HISTORY — PX: ESOPHAGEAL DILATION: SHX303

## 2017-09-23 HISTORY — PX: ESOPHAGOGASTRODUODENOSCOPY: SHX5428

## 2017-09-23 LAB — BASIC METABOLIC PANEL
ANION GAP: 7 (ref 5–15)
BUN: 27 mg/dL — ABNORMAL HIGH (ref 6–20)
CALCIUM: 8.8 mg/dL — AB (ref 8.9–10.3)
CO2: 27 mmol/L (ref 22–32)
CREATININE: 2.39 mg/dL — AB (ref 0.61–1.24)
Chloride: 104 mmol/L (ref 101–111)
GFR calc Af Amer: 28 mL/min — ABNORMAL LOW (ref >60)
GFR calc non Af Amer: 25 mL/min — ABNORMAL LOW (ref >60)
GLUCOSE: 85 mg/dL (ref 65–99)
Potassium: 4.1 mmol/L (ref 3.5–5.1)
Sodium: 138 mmol/L (ref 135–145)

## 2017-09-23 SURGERY — EGD (ESOPHAGOGASTRODUODENOSCOPY)
Anesthesia: Moderate Sedation

## 2017-09-23 MED ORDER — SODIUM CHLORIDE 0.9 % IV SOLN
INTRAVENOUS | Status: DC
  Administered 2017-09-23: 23:00:00 via INTRAVENOUS

## 2017-09-23 MED ORDER — MIDAZOLAM HCL 5 MG/5ML IJ SOLN
INTRAMUSCULAR | Status: DC | PRN
Start: 2017-09-23 — End: 2017-09-23
  Administered 2017-09-23 (×3): 2 mg via INTRAVENOUS
  Administered 2017-09-23: 1 mg via INTRAVENOUS

## 2017-09-23 MED ORDER — FENTANYL CITRATE (PF) 100 MCG/2ML IJ SOLN
INTRAMUSCULAR | Status: AC
  Filled 2017-09-23: qty 2

## 2017-09-23 MED ORDER — GUAIFENESIN 100 MG/5ML PO SOLN
5.0000 mL | ORAL | Status: DC | PRN
  Administered 2017-09-23: 100 mg via ORAL
  Filled 2017-09-23: qty 5

## 2017-09-23 MED ORDER — HEPARIN SODIUM (PORCINE) 5000 UNIT/ML IJ SOLN
5000.0000 [IU] | Freq: Three times a day (TID) | INTRAMUSCULAR | Status: DC
  Administered 2017-09-23 – 2017-09-24 (×3): 5000 [IU] via SUBCUTANEOUS
  Filled 2017-09-23 (×3): qty 1

## 2017-09-23 MED ORDER — ONDANSETRON HCL 4 MG/2ML IJ SOLN
INTRAMUSCULAR | Status: AC
  Filled 2017-09-23: qty 2

## 2017-09-23 MED ORDER — SODIUM CHLORIDE 0.9 % IV SOLN
INTRAVENOUS | Status: DC
  Administered 2017-09-23: 20 mL/h via INTRAVENOUS

## 2017-09-23 MED ORDER — HEPARIN SODIUM (PORCINE) 5000 UNIT/ML IJ SOLN: 5000.0000 [IU] | Freq: Three times a day (TID) | INTRAMUSCULAR | Status: DC

## 2017-09-23 MED ORDER — FENTANYL CITRATE (PF) 100 MCG/2ML IJ SOLN
INTRAMUSCULAR | Status: DC | PRN
  Administered 2017-09-23 (×4): 25 ug via INTRAVENOUS

## 2017-09-23 MED ORDER — MINERAL OIL PO OIL
TOPICAL_OIL | ORAL | Status: AC
  Filled 2017-09-23: qty 30

## 2017-09-23 MED ORDER — LIDOCAINE VISCOUS 2 % MT SOLN
10.0000 mL | Freq: Three times a day (TID) | OROMUCOSAL | Status: DC
  Administered 2017-09-23 – 2017-09-24 (×2): 10 mL via OROMUCOSAL
  Filled 2017-09-23 (×2): qty 15

## 2017-09-23 MED ORDER — PHENOL 1.4 % MT LIQD
1.0000 | OROMUCOSAL | Status: DC | PRN
  Administered 2017-09-23: 1 via OROMUCOSAL
  Filled 2017-09-23: qty 177

## 2017-09-23 MED ORDER — LIDOCAINE VISCOUS 2 % MT SOLN
OROMUCOSAL | Status: AC
  Filled 2017-09-23: qty 15

## 2017-09-23 MED ORDER — PANTOPRAZOLE SODIUM 40 MG PO TBEC
40.0000 mg | DELAYED_RELEASE_TABLET | Freq: Two times a day (BID) | ORAL | Status: DC
  Administered 2017-09-24: 40 mg via ORAL
  Filled 2017-09-23 (×2): qty 1

## 2017-09-23 MED ORDER — LIDOCAINE VISCOUS 2 % MT SOLN
OROMUCOSAL | Status: DC | PRN
  Administered 2017-09-23: 1 via OROMUCOSAL

## 2017-09-23 MED ORDER — MIDAZOLAM HCL 5 MG/5ML IJ SOLN
INTRAMUSCULAR | Status: AC
  Filled 2017-09-23: qty 5

## 2017-09-23 MED ORDER — ONDANSETRON HCL 4 MG/2ML IJ SOLN
INTRAMUSCULAR | Status: DC | PRN
  Administered 2017-09-23: 4 mg via INTRAVENOUS

## 2017-09-23 NOTE — Care Management Note (Signed)
Case Management Note  Patient Details  Name: Justin Parsons MRN: [id number] Date of Birth: Sep 04, 1941  Subjective/Objective:  Adm with intractable n/v. From home alone, has help during the day 7a-10p by 1st cousin and friend who [place].  Walks with a cane normally but reports being weak lately. Reports recent stay at SNF this summer. He is active with [facility]. Scheduled for EGD. Has PCP -Dr. Manuella Ghazi, cousin drives him to appointments.               Action/Plan: Anticipate DC home with resumption of home health . CM following.  Judson Roch of [facility] notified of admission.   Expected Discharge Date:   09/23/2017               Expected Discharge Plan:  [place]  In-House Referral:     Discharge planning Services  CM Consult  Post Acute Care Choice:    Choice offered to:     DME Arranged:    DME Agency:     HH Arranged:    HH Agency:  [facility]  Status of Service:  In process, will continue to follow  If discussed at Long Length of Stay Meetings, dates discussed:    Additional Comments:  Metztli Sachdev, Chauncey Reading, RN 09/23/2017, 11:19 AM

## 2017-09-23 NOTE — Interval H&P Note (Signed)
History and Physical Interval Note:  09/23/2017 2:38 PM  Justin Parsons  has presented today for surgery, with the diagnosis of DYSPHAGIA  The various methods of treatment have been discussed with the patient and family. After consideration of risks, benefits and other options for treatment, the patient has consented to  Procedure(s): ESOPHAGOGASTRODUODENOSCOPY (EGD) (N/A) ESOPHAGEAL DILATION (N/A) as a surgical intervention .  The patient's history has been reviewed, patient examined, no change in status, stable for surgery.  I have reviewed the patient's chart and labs.  Questions were answered to the patient's satisfaction.     [employer]

## 2017-09-23 NOTE — Op Note (Signed)
[facility] Patient Name: Justin Parsons Procedure Date: 09/23/2017 2:33 PM MRN: [id number] Date of Birth: 09/04/1941 Attending MD: Barney Drain MD, MD CSN: [id number] Age: 76 Admit Type: Outpatient Procedure:                Upper GI endoscopy WITH ESOPHAGEAL DILATION Indications:              Dysphagia Providers:                Barney Drain MD, MD, Lurline Del, RN, Aram Candela Referring MD:             Fuller Canada Manuella Ghazi MD, MD Medicines:                Ondansetron 4 mg IV, Fentanyl 100 micrograms IV,                            Midazolam 7 mg IV Complications:            No immediate complications. Estimated Blood Loss:     Estimated blood loss was minimal. Procedure:                Pre-Anesthesia Assessment:                           - Prior to the procedure, a History and Physical                            was performed, and patient medications and                            allergies were reviewed. The patient's tolerance of                            previous anesthesia was also reviewed. The risks                            and benefits of the procedure and the sedation                            options and risks were discussed with the patient.                            All questions were answered, and informed consent                            was obtained. Prior Anticoagulants: The patient has                            taken Plavix (clopidogrel), last dose was 1 day                            prior to procedure. ASA Grade Assessment: III - A                            patient with severe systemic disease. After  reviewing the risks and benefits, the patient was                            deemed in satisfactory condition to undergo the                            procedure. After obtaining informed consent, the                            endoscope was passed under direct vision.                            Throughout the procedure, the  patient's blood                            pressure, pulse, and oxygen saturations were                            monitored continuously. The [id number] ([id number])                            scope was introduced through the mouth, and                            advanced to the second part of duodenum. The upper                            GI endoscopy was somewhat difficult due to the                            patient's agitation. The patient tolerated the                            procedure fairly well. Scope In: 3:11:15 PM Scope Out: 3:18:46 PM Total Procedure Duration: 0 hours 7 minutes 31 seconds  Findings:      LA Grade B (one or more mucosal breaks greater than 5 mm, not extending       between the tops of two mucosal folds) esophagitis with no bleeding was       found.      One moderate (circumferential scarring or stenosis; an endoscope may       pass) benign-appearing, intrinsic stenosis was found. This measured 1.2       cm (inner diameter) and was traversed. A guidewire was placed and the       scope was withdrawn. Dilation was performed with a Savary dilator with       mild resistance at 12 mm and 12.8 mm and moderate resistance at 14 mm       and 15 mm. Estimated blood loss was minimal.      Diffuse mild inflammation characterized by congestion (edema) and       erythema was found in the gastric antrum.      The examined duodenum was normal. Impression:               - LA Grade B reflux esophagitis.                           -  Benign-appearing esophageal stenosis.                           - MILD Gastritis. Moderate Sedation:      Moderate (conscious) sedation was administered by the endoscopy nurse       and supervised by the endoscopist. The following parameters were       monitored: oxygen saturation, heart rate, blood pressure, and response       to care. Total physician intraservice time was 23 minutes. Recommendation:           - Repeat upper endoscopy in 1 month for  retreatment.                           - Pureed diet.                           - Use Protonix (pantoprazole) 40 mg PO BID.                           - Return patient to hospital ward for ongoing care.                           - Continue present medications. Procedure Code(s):        --- Professional ---                           [id number], Esophagogastroduodenoscopy, flexible,                            transoral; with insertion of guide wire followed by                            passage of dilator(s) through esophagus over guide                            wire                           99152, Moderate sedation services provided by the                            same physician or other qualified health care                            professional performing the diagnostic or                            therapeutic service that the sedation supports,                            requiring the presence of an independent trained                            observer to assist in the monitoring of the  patient's level of consciousness and physiological                            status; initial 15 minutes of intraservice time,                            patient age 68 years or older                           [phone], Moderate sedation services; each additional                            15 minutes intraservice time Diagnosis Code(s):        --- Professional ---                           K21.0, Gastro-esophageal reflux disease with                            esophagitis                           K22.2, Esophageal obstruction                           K29.70, Gastritis, unspecified, without bleeding                           R13.10, Dysphagia, unspecified CPT copyright 2016 American Medical Association. All rights reserved. The codes documented in this report are preliminary and upon coder review may  be revised to meet current compliance requirements. Barney Drain, MD Barney Drain MD, MD 09/23/2017 3:28:50 PM This report has been signed electronically. Number of Addenda: 0

## 2017-09-23 NOTE — Progress Notes (Signed)
Pt returned from Endoscopy.  He is alert/oriented.  Denies pain at this time.  All vital signs wnl.

## 2017-09-23 NOTE — Progress Notes (Signed)
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  He presented with dehydration and acute on chronic kidney injury.  Reports he is unable to keep anything down by mouth.  He was sent to the ED from gastroenterology office.  There was concern that he may need another esophageal dilatation.  The patient is currently on IV hydration for dehydration.  Diet is being advanced.  He is tolerating clear liquids.  GI has been consulted.   Assessment & Plan:   Active Problems:   GERD (gastroesophageal reflux disease)   Cirrhosis (HCC)   History of esophageal stricture   Acute renal failure superimposed on stage 3 chronic kidney disease (HCC)   Dehydration   AKI (acute kidney injury) ([place])   Gastric outlet obstruction   Intractable vomiting   Atrial fibrillation with RVR ([place])   1. Intractable nausea and vomiting.  Possibly related to recurrent esophageal stricture.    Appears to be tolerating full liquids.  Continue scheduled Zofran.  GI following and plans EGD on 11/5. 2. Dehydration.  Improving with hydration 3. Acute renal failure superimposed on stage III chronic kidney disease.  Likely related to dehydration and volume depletion.    Baseline creatinine approximately 1.8.  Presents with a creatinine of 3.8.  Creatinine has improved to 2.3 today.  Continue IV fluids for now. 4. GERD.  Continue on PPI 5. Atrial fibrillation with rapid ventricular response.  Likely related to dehydration.    Heart rate is better controlled with hydration and Lopressor.  Not felt to be a good candidate for anticoagulation due to his portal gastropathy.  He is continued on dual  anti-platelet therapy. 6. Alcoholic cirrhosis.  Appears to be compensated at this time.  Follow-up with GI as an outpatient. 7. Coronary artery disease status post stenting in the past.  No complaints of chest pain.  Continue on dual antiplatelet therapy.   DVT prophylaxis: Heparin Code Status: Full code Family Communication: No family present Disposition Plan: Discharge home once improved   Consultants:   Gastroenterology  Procedures:     Antimicrobials:       Subjective: No new complaints today.  No vomiting.  Tolerating full liquids.  No abdominal pain.  Had a bowel movement after receiving an enema yesterday.  Objective: Vitals:   09/23/17 1530 09/23/17 1535 09/23/17 1540 09/23/17 1605  BP: 107/86 110/80 110/83 107/74  Pulse: 95 97 97 91  Resp: 19 18 18    Temp:    98.4 F (36.9 C)  TempSrc:      SpO2: 100% 100% 100%   Weight:      Height:        Intake/Output Summary (Last 24 hours) at 09/23/2017 1643 Last data filed at 09/23/2017 1200 Gross per 24 hour  Intake 2040 ml  Output 815 ml  Net 1225 ml   Filed Weights   09/21/17 0500 09/22/17 0500 09/23/17 0500  Weight: 107 kg (235 lb 14.3 oz) 106.8 kg (235 lb 7.2 oz) 108.3 kg (238 lb 12.1 oz)    Examination:  General exam: Appears calm and comfortable  Respiratory system: Clear to auscultation. Respiratory effort normal. Cardiovascular system: S1 & S2 heard, irregular. No JVD,  murmurs, rubs, gallops or clicks. No pedal edema. Gastrointestinal system: Abdomen is nondistended, soft and nontender. No organomegaly or masses felt. Normal bowel sounds heard. Central nervous system: Alert and oriented. No focal neurological deficits. Extremities: Symmetric 5 x 5 power. Skin: No rashes, lesions or ulcers Psychiatry: Judgement and insight appear normal. Mood & affect appropriate.     Data Reviewed: I have personally reviewed following labs and imaging studies  CBC: Recent Labs  Lab 09/20/17 1224  09/21/17 0357 09/22/17 0830  WBC 12.3* 7.6 6.5  NEUTROABS 8.3*  --   --   HGB 13.9 11.5* 11.2*  HCT 43.4 35.9* 35.3*  MCV 88.0 88.0 88.9  PLT 179 135* 350*   Basic Metabolic Panel: Recent Labs  Lab 09/20/17 1224 09/21/17 0357 09/22/17 0830 09/23/17 0328  NA 136 137 138 138  K 4.0 3.4* 3.3* 4.1  CL 94* 98* 103 104  CO2 23 28 26 27   GLUCOSE 116* 85 98 85  BUN 44* 40* 32* 27*  CREATININE 3.89* 3.26* 2.68* 2.39*  CALCIUM 9.9 9.2 8.9 8.8*   GFR: Estimated Creatinine Clearance: 31.4 mL/min (A) (by C-G formula based on SCr of 2.39 mg/dL (H)). Liver Function Tests: Recent Labs  Lab 09/20/17 1224  AST 32  ALT 15*  ALKPHOS 95  BILITOT 1.4*  PROT 6.8  ALBUMIN 3.4*   No results for input(s): LIPASE, AMYLASE in the last 168 hours. No results for input(s): AMMONIA in the last 168 hours. Coagulation Profile: Recent Labs  Lab 09/20/17 1224  INR 1.08   Cardiac Enzymes: No results for input(s): CKTOTAL, CKMB, CKMBINDEX, TROPONINI in the last 168 hours. BNP (last 3 results) No results for input(s): PROBNP in the last 8760 hours. HbA1C: No results for input(s): HGBA1C in the last 72 hours. CBG: No results for input(s): GLUCAP in the last 168 hours. Lipid Profile: No results for input(s): CHOL, HDL, LDLCALC, TRIG, CHOLHDL, LDLDIRECT in the last 72 hours. Thyroid Function Tests: No results for input(s): TSH, T4TOTAL, FREET4, T3FREE, THYROIDAB in the last 72 hours. Anemia Panel: No results for input(s): VITAMINB12, FOLATE, FERRITIN, TIBC, IRON, RETICCTPCT in the last 72 hours. Sepsis Labs: Recent Labs  Lab 09/20/17 1238 09/20/17 1419  LATICACIDVEN 4.45* 2.02*    Recent Results (from the past 240 hour(s))  Culture, blood (Routine x 2)     Status: None (Preliminary result)   Collection Time: 09/20/17 12:26 PM  Result Value Ref Range Status   Specimen Description RIGHT ANTECUBITAL  Final   Special Requests   Final    BOTTLES DRAWN AEROBIC AND ANAEROBIC Blood Culture  adequate volume   Culture NO GROWTH 3 DAYS  Final   Report Status PENDING  Incomplete  Culture, blood (Routine x 2)     Status: None (Preliminary result)   Collection Time: 09/20/17 12:26 PM  Result Value Ref Range Status   Specimen Description BLOOD RIGHT WRIST  Final   Special Requests   Final    BOTTLES DRAWN AEROBIC AND ANAEROBIC Blood Culture results may not be optimal due to an inadequate volume of blood received in culture bottles   Culture NO GROWTH 3 DAYS  Final   Report Status PENDING  Incomplete  MRSA PCR Screening     Status: None   Collection Time: 09/20/17  6:39 PM  Result Value Ref Range Status   MRSA by PCR NEGATIVE NEGATIVE Final    Comment:        The GeneXpert MRSA Assay (FDA approved for NASAL specimens  only), is one component of a comprehensive MRSA colonization surveillance program. It is not intended to diagnose MRSA infection nor to guide or monitor treatment for MRSA infections.          Radiology Studies: No results found.      Scheduled Meds: . allopurinol  300 mg Oral Daily  . aspirin  81 mg Oral Daily  . atorvastatin  80 mg Oral q1800  . clopidogrel  75 mg Oral Daily  . feeding supplement  1 Container Oral BID BM  . feeding supplement (PRO-STAT SUGAR FREE 64)  30 mL Oral BID  . fentaNYL      . heparin subcutaneous  5,000 Units Subcutaneous Q8H  . levothyroxine  50 mcg Oral QAC breakfast  . lidocaine      . metoprolol tartrate  12.5 mg Oral BID  . midazolam      . midazolam      . mineral oil      . multivitamin  15 mL Oral Daily  . ondansetron      . ondansetron (ZOFRAN) IV  4 mg Intravenous Q6H  . pantoprazole (PROTONIX) IV  40 mg Intravenous Q12H   Continuous Infusions: . sodium chloride Stopped (09/23/17 1411)     LOS: 3 days    Time spent: 42mns    Valborg Friar, MD [facility] Pager [phone] If 7PM-7AM, please contact night-coverage [email or web address] Password [email or web address]11/03/2017, 4:43 PM

## 2017-09-24 ENCOUNTER — Telehealth: Payer: Self-pay | Admitting: Gastroenterology

## 2017-09-24 LAB — BASIC METABOLIC PANEL
ANION GAP: 6 (ref 5–15)
BUN: 25 mg/dL — ABNORMAL HIGH (ref 6–20)
CHLORIDE: 110 mmol/L (ref 101–111)
CO2: 24 mmol/L (ref 22–32)
Calcium: 8.7 mg/dL — ABNORMAL LOW (ref 8.9–10.3)
Creatinine, Ser: 1.97 mg/dL — ABNORMAL HIGH (ref 0.61–1.24)
GFR calc non Af Amer: 31 mL/min — ABNORMAL LOW (ref >60)
GFR, EST AFRICAN AMERICAN: 36 mL/min — AB (ref >60)
Glucose, Bld: 92 mg/dL (ref 65–99)
Potassium: 3.9 mmol/L (ref 3.5–5.1)
Sodium: 140 mmol/L (ref 135–145)

## 2017-09-24 LAB — GLUCOSE, CAPILLARY
Glucose-Capillary: 80 mg/dL (ref 65–99)
Glucose-Capillary: 128 mg/dL — ABNORMAL HIGH (ref 65–99)
Glucose-Capillary: 102 mg/dL — ABNORMAL HIGH (ref 65–99)

## 2017-09-24 MED ORDER — POLYETHYLENE GLYCOL 3350 17 G PO PACK: 17.0000 g | PACK | Freq: Every day | ORAL | 0 refills | Status: DC

## 2017-09-24 MED ORDER — POLYETHYLENE GLYCOL 3350 17 G PO PACK
17.0000 g | PACK | Freq: Every day | ORAL | Status: DC
  Administered 2017-09-24: 17 g via ORAL
  Filled 2017-09-24: qty 1

## 2017-09-24 NOTE — Care Management Important Message (Signed)
Important Message  Patient Details  Name: Justin Parsons MRN: [id number] Date of Birth: August 16, 1941   Medicare Important Message Given:  Yes    Sherald Barge, RN 09/24/2017, 3:00 PM

## 2017-09-24 NOTE — Clinical Social Work Note (Signed)
Patient states that he is going home to resume his HHPT through [facility]. He states that he has exhausted all his days in skilled care.  LCSW signing off.      Colin Norment, Clydene Pugh, LCSW

## 2017-09-24 NOTE — Progress Notes (Signed)
CC'ED TO PCP 

## 2017-09-24 NOTE — Discharge Summary (Signed)
Physician Discharge Summary  Justin Parsons JJH:[id number] DOB: 1941-07-27 DOA: 09/20/2017  PCP: Monico Blitz, MD  Admit date: 09/20/2017 Discharge date: 09/24/2017  Admitted From: home Disposition:  home  Recommendations for Outpatient Follow-up:  1. Follow up with PCP in 1-2 weeks 2. Please obtain BMP/CBC in one week 3. 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  He presented with dehydration and acute on chronic kidney injury.  Reports he is unable to keep anything down by mouth.  He was sent to the ED from gastroenterology office.  There was concern that he may need another esophageal dilatation.     Discharge Diagnoses:  Active Problems:   GERD (gastroesophageal reflux disease)   Cirrhosis (HCC)   History of esophageal stricture   Acute renal failure superimposed on stage 3 chronic kidney disease (HCC)   Dehydration   AKI (acute kidney injury) ([place])   Gastric outlet obstruction   Intractable vomiting   Atrial fibrillation with RVR ([place])   1. Intractable nausea and vomiting. related to recurrent esophageal stricture.  Patient was seen by gastroenterology and underwent endoscopy with esophageal dilatation.  Postprocedure, he appears to be tolerating a dysphagia 2 diet.  No recurrence of vomiting.  Overall he is doing well.  Will follow up with gastroenterology in 4 weeks for repeat EGD.  He was noted to have gastritis on EGD and recommendations were for PPI twice daily 2. Dehydration.  Improved with hydration 3. Acute renal failure superimposed on stage III chronic  kidney disease. Likely related to dehydration and volume depletion.   Baseline creatinine approximately 1.8.  Presents with a creatinine of 3.8.  Creatinine has improved to 1.8 today.   4. GERD. Continue on PPI 5. Atrial fibrillation with rapid ventricular response. Likely related to dehydration.   Heart rate is better controlled with hydration and Lopressor.  Not felt to be a good candidate for anticoagulation due to his portal gastropathy.  He is continued on dual anti-platelet therapy. 6. Alcoholic cirrhosis.  Appears to be compensated at this time.  Follow-up with GI as an outpatient. 7. Coronary artery disease status post stenting in the past.  No complaints of chest pain.  Continue on dual antiplatelet therapy. 8. Generalized weakness.  Seen by physical therapy who recommended skilled nurse facility placement.  Unfortunately, patient has used all of his skilled nursing insurance benefits and will be returning home with home health.    Discharge Instructions  Discharge Instructions    Diet - low sodium heart healthy   Complete by:  As directed    Increase activity slowly   Complete by:  As directed      Allergies as of 09/24/2017   No Known Allergies     Medication List    TAKE these medications   allopurinol 100 MG tablet Commonly known as:  ZYLOPRIM Take 1 tablet (100 mg total) by mouth daily. What changed:  how much to take   aspirin 81 MG chewable tablet Chew 81 mg by mouth daily.   atorvastatin 80 MG tablet Commonly known as:  LIPITOR Take 80 mg by mouth daily at 6 PM.   calcium carbonate  500 MG chewable tablet Commonly known as:  TUMS - dosed in mg elemental calcium Chew 1 tablet by mouth 2 (two) times daily.   clopidogrel 75 MG tablet Commonly known as:  PLAVIX Take 1 tablet (75 mg total) by mouth daily.   famotidine 20 MG tablet Commonly known as:  PEPCID Take 1 tablet (20 mg total) by mouth daily.   feeding supplement Liqd Take 1 Container by mouth  4 (four) times daily.   folic acid 1 MG tablet Commonly known as:  FOLVITE Take 1 tablet (1 mg total) by mouth daily.   levothyroxine 50 MCG tablet Commonly known as:  SYNTHROID, LEVOTHROID Take 50 mcg by mouth daily before breakfast.   Melatonin 3 MG Tabs Take 1 tablet by mouth at bedtime.   metoprolol tartrate 25 MG tablet Commonly known as:  LOPRESSOR Take 0.5 tablets (12.5 mg total) by mouth 2 (two) times daily.   omeprazole 40 MG capsule Commonly known as:  PRILOSEC Take 1 capsule (40 mg total) by mouth 2 (two) times daily.   oxazepam 10 MG capsule Commonly known as:  SERAX Take 10 mg by mouth at bedtime as needed for sleep or anxiety.   polyethylene glycol packet Commonly known as:  MIRALAX / GLYCOLAX Take 17 g daily by mouth. Start taking on:  09/25/2017   traMADol 50 MG tablet Commonly known as:  ULTRAM Take 1 tablet (50 mg total) by mouth every 6 (six) hours as needed.   vitamin C 500 MG tablet Commonly known as:  ASCORBIC ACID Take 500 mg by mouth daily.   Vitamin D2 400 units Tabs Take 1 tablet by mouth 2 (two) times daily.      Follow-up Information    Fields, Marga Melnick, MD. Schedule an appointment as soon as possible for a visit in 1 month(s).   Specialty:  Gastroenterology Contact information: [address] [place] [address] [phone]          No Known Allergies  Consultations:  gastroenterology   Procedures/Studies: Ct Abdomen Pelvis Wo Contrast  Result Date: 09/20/2017 CLINICAL DATA:  Nausea and vomiting times 2-3 days. EXAM: CT ABDOMEN AND PELVIS WITHOUT CONTRAST TECHNIQUE: Multidetector CT imaging of the abdomen and pelvis was performed following the standard protocol without IV contrast. COMPARISON:  04/27/2017 CT FINDINGS: Lower chest: Stable cardiomegaly without pericardial effusion. Left main and three-vessel coronary arteriosclerosis is identified as before. There is scarring and/or atelectasis in the right lower lobe. No  effusion or pneumothorax. Hepatobiliary: Surface nodularity of the liver consistent with cirrhosis. Right hepatic granuloma. No biliary dilatation or space-occupying mass on this unenhanced study. Layering gallstones without complication. Pancreas: Mild pancreatic atrophy without ductal dilatation, inflammation or mass. Spleen: No splenomegaly.  No focal mass. Adrenals/Urinary Tract: Normal left adrenal gland. 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 Mild renal cortical thinning bilaterally with nonobstructing right lower pole renal calculus. Faint calcifications of the left kidney are also noted without obstruction. No hydroureteronephrosis. The urinary bladder is nondistended the partially obscured by streak artifacts the patient's hip arthroplasties. Stomach/Bowel: Nondistended stomach. Normal small bowel rotation and ligament of Treitz. Normal appendix. No acute bowel inflammation nor obstruction. Moderate stool burden in the rectum. Vascular/Lymphatic: Mild aortoiliac and branch vessel atherosclerosis. No adenopathy. Reproductive: Prostate is not enlarged in appearance but obscured by streak artifacts the patient's hip arthroplasties. Other: Small fat containing inguinal hernia. Musculoskeletal: Atrophy of the right iliopsoas. Bilateral hip arthroplasties. Lumbosacral spondylosis. IMPRESSION: 1. No acute bowel  obstruction or inflammation. No evidence of pancreatitis. 2. Bilateral nephrolithiasis without obstructive uropathy. 3. 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 4. Uncomplicated cholelithiasis. 5. Hepatic cirrhosis. 6. Left main and three-vessel coronary arteriosclerosis. Electronically Signed   By: Ashley Royalty M.D.   On: 09/20/2017 23:08   Dg Chest Portable 1 View  Result Date: 09/20/2017 CLINICAL DATA:  VOMITING X 1 MONTH, HTN, A-FIB, NON SMOKER EXAM: PORTABLE CHEST 1 VIEW COMPARISON:  08/06/2017 FINDINGS: Cardiac silhouette is mildly  enlarged. No mediastinal or hilar masses. Clear lungs. No convincing pleural effusion. No pneumothorax. Skeletal structures are grossly intact. IMPRESSION: No acute cardiopulmonary disease. Electronically Signed   By: Lajean Manes M.D.   On: 09/20/2017 13:40    Endoscopy:   Impression:               - LA Grade B reflux esophagitis.                           - Benign-appearing esophageal stenosis.                           - MILD Gastritis  Subjective: Tolerating solid diet. No vomiting.  Discharge Exam: Vitals:   09/24/17 0705 09/24/17 1400  BP: 109/67 113/64  Pulse: 87 82  Resp: 18 18  Temp: 97.6 F (36.4 C) 98.3 F (36.8 C)  SpO2: 99% 98%   Vitals:   09/23/17 2041 09/23/17 2215 09/24/17 0705 09/24/17 1400  BP: 93/61 (!) 91/47 109/67 113/64  Pulse: 84 100 87 82  Resp: 18  18 18   Temp: 98.4 F (36.9 C)  97.6 F (36.4 C) 98.3 F (36.8 C)  TempSrc: Oral  Oral Oral  SpO2: 97%  99% 98%  Weight:      Height:        General: Pt is alert, awake, not in acute distress Cardiovascular: irregular, S1/S2 +, no rubs, no gallops Respiratory: CTA bilaterally, no wheezing, no rhonchi Abdominal: Soft, NT, ND, bowel sounds + Extremities: no edema, no cyanosis    The results of significant diagnostics from this hospitalization (including imaging, microbiology, ancillary and laboratory) are listed below for reference.     Microbiology: Recent Results (from the past 240 hour(s))  Culture, blood (Routine x 2)     Status: None (Preliminary result)   Collection Time: 09/20/17 12:26 PM  Result Value Ref Range Status   Specimen Description RIGHT ANTECUBITAL  Final   Special Requests   Final    BOTTLES DRAWN AEROBIC AND ANAEROBIC Blood Culture adequate volume   Culture NO GROWTH 4 DAYS  Final   Report Status PENDING  Incomplete  Culture, blood (Routine x 2)     Status: None (Preliminary result)   Collection Time: 09/20/17 12:26 PM  Result Value Ref Range Status   Specimen  Description BLOOD RIGHT WRIST  Final   Special Requests   Final    BOTTLES DRAWN AEROBIC AND ANAEROBIC Blood Culture results may not be optimal due to an inadequate volume of blood received in culture bottles   Culture NO GROWTH 4 DAYS  Final   Report Status PENDING  Incomplete  MRSA PCR Screening     Status: None   Collection Time: 09/20/17  6:39 PM  Result Value Ref Range Status   MRSA by PCR NEGATIVE NEGATIVE Final    Comment:        The  GeneXpert MRSA Assay (FDA approved for NASAL specimens only), is one component of a comprehensive MRSA colonization surveillance program. It is not intended to diagnose MRSA infection nor to guide or monitor treatment for MRSA infections.      Labs: BNP (last 3 results) No results for input(s): BNP in the last 8760 hours. Basic Metabolic Panel: Recent Labs  Lab 09/20/17 1224 09/21/17 0357 09/22/17 0830 09/23/17 0328 09/24/17 0459  NA 136 137 138 138 140  K 4.0 3.4* 3.3* 4.1 3.9  CL 94* 98* 103 104 110  CO2 23 28 26 27 24   GLUCOSE 116* 85 98 85 92  BUN 44* 40* 32* 27* 25*  CREATININE 3.89* 3.26* 2.68* 2.39* 1.97*  CALCIUM 9.9 9.2 8.9 8.8* 8.7*   Liver Function Tests: Recent Labs  Lab 09/20/17 1224  AST 32  ALT 15*  ALKPHOS 95  BILITOT 1.4*  PROT 6.8  ALBUMIN 3.4*   No results for input(s): LIPASE, AMYLASE in the last 168 hours. No results for input(s): AMMONIA in the last 168 hours. CBC: Recent Labs  Lab 09/20/17 1224 09/21/17 0357 09/22/17 0830  WBC 12.3* 7.6 6.5  NEUTROABS 8.3*  --   --   HGB 13.9 11.5* 11.2*  HCT 43.4 35.9* 35.3*  MCV 88.0 88.0 88.9  PLT 179 135* 132*   Cardiac Enzymes: No results for input(s): CKTOTAL, CKMB, CKMBINDEX, TROPONINI in the last 168 hours. BNP: Invalid input(s): POCBNP CBG: Recent Labs  Lab 09/24/17 0731 09/24/17 1121  GLUCAP 80 128*   D-Dimer No results for input(s): DDIMER in the last 72 hours. Hgb A1c No results for input(s): HGBA1C in the last 72 hours. Lipid  Profile No results for input(s): CHOL, HDL, LDLCALC, TRIG, CHOLHDL, LDLDIRECT in the last 72 hours. Thyroid function studies No results for input(s): TSH, T4TOTAL, T3FREE, THYROIDAB in the last 72 hours.  Invalid input(s): FREET3 Anemia work up No results for input(s): VITAMINB12, FOLATE, FERRITIN, TIBC, IRON, RETICCTPCT in the last 72 hours. Urinalysis    Component Value Date/Time   COLORURINE YELLOW 09/20/2017 2024   APPEARANCEUR HAZY (A) 09/20/2017 2024   LABSPEC 1.016 09/20/2017 2024   PHURINE 5.0 09/20/2017 2024   GLUCOSEU NEGATIVE 09/20/2017 2024   HGBUR NEGATIVE 09/20/2017 2024   BILIRUBINUR NEGATIVE 09/20/2017 2024   KETONESUR 5 (A) 09/20/2017 2024   PROTEINUR 30 (A) 09/20/2017 2024   UROBILINOGEN 0.2 11/12/2013 2315   NITRITE NEGATIVE 09/20/2017 2024   LEUKOCYTESUR LARGE (A) 09/20/2017 2024   Sepsis Labs Invalid input(s): PROCALCITONIN,  WBC,  LACTICIDVEN Microbiology Recent Results (from the past 240 hour(s))  Culture, blood (Routine x 2)     Status: None (Preliminary result)   Collection Time: 09/20/17 12:26 PM  Result Value Ref Range Status   Specimen Description RIGHT ANTECUBITAL  Final   Special Requests   Final    BOTTLES DRAWN AEROBIC AND ANAEROBIC Blood Culture adequate volume   Culture NO GROWTH 4 DAYS  Final   Report Status PENDING  Incomplete  Culture, blood (Routine x 2)     Status: None (Preliminary result)   Collection Time: 09/20/17 12:26 PM  Result Value Ref Range Status   Specimen Description BLOOD RIGHT WRIST  Final   Special Requests   Final    BOTTLES DRAWN AEROBIC AND ANAEROBIC Blood Culture results may not be optimal due to an inadequate volume of blood received in culture bottles   Culture NO GROWTH 4 DAYS  Final   Report Status PENDING  Incomplete  MRSA  PCR Screening     Status: None   Collection Time: 09/20/17  6:39 PM  Result Value Ref Range Status   MRSA by PCR NEGATIVE NEGATIVE Final    Comment:        The GeneXpert MRSA Assay  (FDA approved for NASAL specimens only), is one component of a comprehensive MRSA colonization surveillance program. It is not intended to diagnose MRSA infection nor to guide or monitor treatment for MRSA infections.      Time coordinating discharge: Over 30 minutes  SIGNED:   Kathie Dike, MD  [facility] 09/24/2017, 3:24 PM Pager   If 7PM-7AM, please contact night-coverage [email or web address] Password [email or web address]

## 2017-09-24 NOTE — Care Management Note (Signed)
Case Management Note  Patient Details  Name: Justin Parsons MRN: [id number] Date of Birth: 09/07/1941  Expected Discharge Date:     09/24/2017             Expected Discharge Plan:  [place]  In-House Referral:  Clinical Social Work  Discharge planning Services  CM Consult  Post Acute Care Choice:  Home Health, Resumption of Svcs/PTA Provider Choice offered to:  Patient  HH Arranged:  Therapist, sports, PT, Nurse's Aide, Social Work, OT [employer] Agency:  [facility]  Status of Service:  Completed, signed off  Additional Comments: Discharging home today with resumption of [facility] services. He is aware HH has 48 hrs to resume services. [facility] rep, aware of DC planned for today and will pull pt info from chart.  Sherald Barge, RN 09/24/2017, 3:00 PM

## 2017-09-24 NOTE — Telephone Encounter (Signed)
PT NEEDS EGD/DIL IN 4 WEEKS, Dx: ESOPHAGEAL STRICTURE. NEEDS ZOFRAN 4 MG IV IN PREOP.

## 2017-09-24 NOTE — Progress Notes (Signed)
REVIEWED-NO ADDITIONAL RECOMMENDATIONS.   Subjective: Dysphagia improved. Did well with breakfast. Worried about going home today as it may be too soon. Last BM a few days ago. Worried he will be constipated.   Objective: Vital signs in last 24 hours: Temp:  [97.5 F (36.4 C)-98.4 F (36.9 C)] 97.6 F (36.4 C) (11/06 0705) Pulse Rate:  [40-100] 87 (11/06 0705) Resp:  [17-22] 18 (11/06 0705) BP: (81-140)/(47-105) 109/67 (11/06 0705) SpO2:  [95 %-100 %] 99 % (11/06 0705) Last BM Date: 09/22/17 General:   Alert and oriented, pleasant Head:  Normocephalic and atraumatic. Eyes:  No icterus, sclera clear. Conjuctiva pink.  Abdomen:  Bowel sounds present, soft, non-tender, non-distended. No HSM or hernias noted. No rebound or guarding. No masses appreciated  Neurologic:  Alert and  oriented x4 Psych:  Alert and cooperative. Normal mood and affect.  Intake/Output from previous day: 11/05 0701 - 11/06 0700 In: 985.8 [P.O.:240; I.V.:745.8] Out: 1000 [Urine:1000] Intake/Output this shift: No intake/output data recorded.  Lab Results: Recent Labs    09/22/17 0830  WBC 6.5  HGB 11.2*  HCT 35.3*  PLT 132*   BMET Recent Labs    09/22/17 0830 09/23/17 0328 09/24/17 0459  NA 138 138 140  K 3.3* 4.1 3.9  CL 103 104 110  CO2 26 27 24   GLUCOSE 98 85 92  BUN 32* 27* 25*  CREATININE 2.68* 2.39* 1.97*  CALCIUM 8.9 8.8* 8.7*    Assessment: 76 year old male with dysphagia, s/p EGD yesterday with LA Grade B esophagitis, one moderate benign-appearing intrinsic stenosis traversed, s/p Savary dilatation, mild gastritis. Clinically improved and tolerating diet. Appropriate for discharge from a GI standpoint.   Due to mild constipation: start Miralax once daily.   Plan: Protonix BID Dysphagia 2 diet Repeat endoscopy in 1 month.  Will arrange outpatient follow-up  Annitta Needs, PhD, ANP-BC [facility] Gastroenterology     LOS: 4 days    09/24/2017, 9:20 AM

## 2017-09-24 NOTE — Plan of Care (Signed)
Continue current treatment plan.

## 2017-09-24 NOTE — Progress Notes (Signed)
Patient discharged with instructions given on medications,and follow up visits,patient and family verbalized understanding.Prescriptons sent with patient. Staff to accompany patient to an awaiting  vehicle

## 2017-09-24 NOTE — Evaluation (Signed)
Physical Therapy Evaluation Patient Details Name: Justin Parsons MRN: [id number] DOB: [birth year] Today's Date: 09/24/2017   History of Present Illness  Justin Parsons is a 76 y.o. male s/p EGD 09/23/17 with medical history significant of chronic atrial fibrillation, chronic kidney disease stage III, gout, esophageal pyloric strictures, was discharged from the hospital on 9/30 after being treated for intractable nausea and vomiting.  EGD done at that diffuse mild inflammation with edema of the entire stomach, severe intrinsic stenosis of the esophagus, which was dilated, gastric stenosis at the pylorus which was also dilated.  Patient reports after this procedure he was feeling better.  Was able to tolerate things like oatmeal.  Was discharged to a skilled nursing facility.  He was there for a few weeks and then reports had to return home when his insurance benefits ran out.  He reports that he has been unwell since he has returned home.  Has had increased nausea and vomiting and been unable to tolerate anything by mouth.  He is dizziness on standing and generally weak.  He has not had a bowel movement approximately 1 week.  He is occasionally passing flatus.  No fever, cough, shortness of breath.  He does complain of some "numbness" in his epigastric area.  He went to the GI office today and was actively vomiting in the waiting room.  He was sent to the ER for evaluation.    Clinical Impression  Patient limited for taking steps due to c/o fatigue, weakness and fair/poor standing balance with risk for falls, able to transfer to chair and tolerated sitting up after therapy.  Patient will benefit from continued physical therapy in hospital and recommended venue below to increase strength, balance, endurance for safe ADLs and gait.    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 Restrictions Weight Bearing Restrictions: No      Mobility  Bed Mobility Overal bed mobility: Needs Assistance Bed Mobility: Supine to Sit;Sit to Supine     Supine to sit: Supervision Sit to supine: Supervision      Transfers Overall transfer level: Needs assistance Equipment used: Rolling walker (2 wheeled) Transfers: Sit to/from [facility] Sit to Stand: Min assist Stand pivot transfers: Min assist       General transfer comment: Required frequent verbal cues for proper hand placement during sit to stands/transfers  Ambulation/Gait Ambulation/Gait assistance: Min assist Ambulation Distance (Feet): 2 Feet Assistive device: Rolling walker (2 wheeled) Gait Pattern/deviations: Decreased step length - right;Decreased step length - left;Decreased stride length   Gait velocity interpretation: Below normal speed for age/gender General Gait Details: Patient limited 4-5 steps during transfer to chair, declined to attempt walking away from bed side due to c/o fatigue/weakness  Stairs            Wheelchair Mobility    Modified Rankin (Stroke Patients Only)       Balance Overall balance assessment: Needs assistance Sitting-balance support: No upper extremity supported;Feet supported Sitting balance-Leahy Scale: Good     Standing balance support: Bilateral upper extremity supported;During functional activity Standing balance-Leahy Scale: Fair                               Pertinent Vitals/Pain Pain Assessment: 0-10  Pain Score: 5  Pain Location: RLE, left foot Pain Descriptors / Indicators: Discomfort;Aching Pain Intervention(s): Limited activity within patient's tolerance;Monitored during session    Home Living Family/patient expects to be discharged to:: Private residence Living Arrangements: Non-relatives/Friends;Other  relatives Available Help at Discharge: Friend(s);Available PRN/intermittently Type of Home: House Home Access: Ramped entrance     Home Layout: One level Home Equipment: Cane - single point;Walker - 2 wheels;Wheelchair - [employer];Shower seat - built in      Prior Function Level of Independence: Independent with assistive device(s)               Hand Dominance        Extremity/Trunk Assessment   Upper Extremity Assessment Upper Extremity Assessment: Generalized weakness    Lower Extremity Assessment Lower Extremity Assessment: Generalized weakness    Cervical / Trunk Assessment Cervical / Trunk Assessment: Normal  Communication   Communication: No difficulties  Cognition Arousal/Alertness: Awake/alert Behavior During Therapy: WFL for tasks assessed/performed Overall Cognitive Status: Within Functional Limits for tasks assessed                                        General Comments      Exercises     Assessment/Plan    PT Assessment Patient needs continued PT services  PT Problem List Decreased activity tolerance;Decreased balance;Decreased mobility;Decreased strength       PT Treatment Interventions Gait training;Functional mobility training;Therapeutic activities;Therapeutic exercise;Patient/family education    PT Goals (Current goals can be found in the Care Plan section)  Acute Rehab PT Goals Patient Stated Goal: return home after rehab PT Goal Formulation: With patient Time For Goal Achievement: 10/01/17 Potential to Achieve Goals: Good    Frequency Min 3X/week   Barriers to discharge        Co-evaluation               AM-PAC PT "6 Clicks" Daily Activity  Outcome Measure Difficulty turning over in bed (including adjusting bedclothes, sheets and blankets)?: None Difficulty moving from lying on back to sitting on the side of the bed? : None Difficulty sitting down on and standing up from a chair with  arms (e.g., wheelchair, bedside commode, etc,.)?: A Little Help needed moving to and from a bed to chair (including a wheelchair)?: A Little Help needed walking in hospital room?: A Little Help needed climbing 3-5 steps with a railing? : A Lot 6 Click Score: 19    End of Session Equipment Utilized During Treatment: Gait belt Activity Tolerance: Patient limited by fatigue Patient left: in chair;with call bell/phone within reach Nurse Communication: Mobility status PT Visit Diagnosis: Unsteadiness on feet (R26.81);Other abnormalities of gait and mobility (R26.89);Muscle weakness (generalized) (M62.81)    Time: 4967-5916 PT Time Calculation (min) (ACUTE ONLY): 26 min   Charges:   PT Evaluation $PT Eval Low Complexity: 1 Low PT Treatments $Therapeutic Activity: 8-22 mins   PT G Codes:        1:52 PM, 16-Oct-2017 Lonell Grandchild, MPT Physical Therapist with [facility] [phone] office [phone] mobile phone

## 2017-09-24 NOTE — Plan of Care (Signed)
  Acute Rehab PT Goals(only PT should resolve) Pt Will Go Supine/Side To Sit 09/24/2017 1353 - Progressing by Lonell Grandchild, PT Flowsheets Taken 09/24/2017 1353  Pt will go Supine/Side to Sit Independently Patient Will Transfer Sit To/From Stand 09/24/2017 1353 - Progressing by Lonell Grandchild, PT Flowsheets Taken 09/24/2017 1353  Patient will transfer sit to/from stand with supervision Pt Will Transfer Bed To Chair/Chair To Bed 09/24/2017 1353 - Progressing by Lonell Grandchild, PT Flowsheets Taken 09/24/2017 1353  Pt will Transfer Bed to Chair/Chair to Bed with supervision Pt Will Ambulate 09/24/2017 1353 - Progressing by Lonell Grandchild, PT Flowsheets Taken 09/24/2017 1353  Pt will Ambulate 50 feet;with supervision;with rolling walker  1:54 PM, 09/24/17 Lonell Grandchild, MPT Physical Therapist with [facility] [phone] office [phone] mobile phone

## 2017-09-25 ENCOUNTER — Encounter (HOSPITAL_COMMUNITY): Payer: Self-pay | Admitting: Gastroenterology

## 2017-09-25 LAB — CULTURE, BLOOD (ROUTINE X 2)
Culture: NO GROWTH
Culture: NO GROWTH
SPECIAL REQUESTS: ADEQUATE

## 2017-09-26 NOTE — Telephone Encounter (Addendum)
Home # in chart is not correct and mobile # listed for patient is [facility] (no longer lives there). Called # for emergency contact in chart-LMOVM

## 2017-09-26 NOTE — Telephone Encounter (Signed)
Spoke with POA Jeneen Rinks. He states [facility] does not answer phone calls and the EGD/DIL in 4 weeks needs to be scheduled with Elenore Rota. He is going to see if he can get the patient to answer his call to call us to get this scheduled.

## 2017-09-27 ENCOUNTER — Encounter: Payer: Self-pay | Admitting: *Deleted

## 2017-09-27 ENCOUNTER — Other Ambulatory Visit: Payer: Self-pay | Admitting: *Deleted

## 2017-09-27 DIAGNOSIS — K222 Esophageal obstruction: Secondary | ICD-10-CM

## 2017-09-27 NOTE — Telephone Encounter (Signed)
Spoke with pt. He is scheduled for EGD/DIL 10/18/17 at 2:15pm. Aware needs to arrive at 1:15 at the [place]. Instructions have been mailed to patient as well.

## 2017-10-01 DIAGNOSIS — K5900 Constipation, unspecified: Secondary | ICD-10-CM | POA: Diagnosis not present

## 2017-10-01 DIAGNOSIS — M1990 Unspecified osteoarthritis, unspecified site: Secondary | ICD-10-CM | POA: Diagnosis not present

## 2017-10-01 DIAGNOSIS — R109 Unspecified abdominal pain: Secondary | ICD-10-CM | POA: Diagnosis not present

## 2017-10-01 DIAGNOSIS — Z955 Presence of coronary angioplasty implant and graft: Secondary | ICD-10-CM | POA: Diagnosis not present

## 2017-10-01 DIAGNOSIS — I10 Essential (primary) hypertension: Secondary | ICD-10-CM | POA: Diagnosis not present

## 2017-10-02 DIAGNOSIS — K222 Esophageal obstruction: Secondary | ICD-10-CM | POA: Diagnosis not present

## 2017-10-02 DIAGNOSIS — I5032 Chronic diastolic (congestive) heart failure: Secondary | ICD-10-CM | POA: Diagnosis not present

## 2017-10-02 DIAGNOSIS — I959 Hypotension, unspecified: Secondary | ICD-10-CM | POA: Diagnosis not present

## 2017-10-02 DIAGNOSIS — R1311 Dysphagia, oral phase: Secondary | ICD-10-CM | POA: Diagnosis not present

## 2017-10-02 DIAGNOSIS — M1990 Unspecified osteoarthritis, unspecified site: Secondary | ICD-10-CM | POA: Diagnosis not present

## 2017-10-02 DIAGNOSIS — M109 Gout, unspecified: Secondary | ICD-10-CM | POA: Diagnosis not present

## 2017-10-03 DIAGNOSIS — M109 Gout, unspecified: Secondary | ICD-10-CM | POA: Diagnosis not present

## 2017-10-03 DIAGNOSIS — I5032 Chronic diastolic (congestive) heart failure: Secondary | ICD-10-CM | POA: Diagnosis not present

## 2017-10-03 DIAGNOSIS — K222 Esophageal obstruction: Secondary | ICD-10-CM | POA: Diagnosis not present

## 2017-10-03 DIAGNOSIS — R1311 Dysphagia, oral phase: Secondary | ICD-10-CM | POA: Diagnosis not present

## 2017-10-03 DIAGNOSIS — M1990 Unspecified osteoarthritis, unspecified site: Secondary | ICD-10-CM | POA: Diagnosis not present

## 2017-10-03 DIAGNOSIS — I959 Hypotension, unspecified: Secondary | ICD-10-CM | POA: Diagnosis not present

## 2017-10-04 DIAGNOSIS — I5032 Chronic diastolic (congestive) heart failure: Secondary | ICD-10-CM | POA: Diagnosis not present

## 2017-10-04 DIAGNOSIS — R1311 Dysphagia, oral phase: Secondary | ICD-10-CM | POA: Diagnosis not present

## 2017-10-04 DIAGNOSIS — M1990 Unspecified osteoarthritis, unspecified site: Secondary | ICD-10-CM | POA: Diagnosis not present

## 2017-10-04 DIAGNOSIS — M109 Gout, unspecified: Secondary | ICD-10-CM | POA: Diagnosis not present

## 2017-10-04 DIAGNOSIS — K222 Esophageal obstruction: Secondary | ICD-10-CM | POA: Diagnosis not present

## 2017-10-04 DIAGNOSIS — I959 Hypotension, unspecified: Secondary | ICD-10-CM | POA: Diagnosis not present

## 2017-10-07 DIAGNOSIS — I5032 Chronic diastolic (congestive) heart failure: Secondary | ICD-10-CM | POA: Diagnosis not present

## 2017-10-07 DIAGNOSIS — M109 Gout, unspecified: Secondary | ICD-10-CM | POA: Diagnosis not present

## 2017-10-07 DIAGNOSIS — I959 Hypotension, unspecified: Secondary | ICD-10-CM | POA: Diagnosis not present

## 2017-10-07 DIAGNOSIS — K222 Esophageal obstruction: Secondary | ICD-10-CM | POA: Diagnosis not present

## 2017-10-07 DIAGNOSIS — M1990 Unspecified osteoarthritis, unspecified site: Secondary | ICD-10-CM | POA: Diagnosis not present

## 2017-10-07 DIAGNOSIS — R1311 Dysphagia, oral phase: Secondary | ICD-10-CM | POA: Diagnosis not present

## 2017-10-08 DIAGNOSIS — R1311 Dysphagia, oral phase: Secondary | ICD-10-CM | POA: Diagnosis not present

## 2017-10-08 DIAGNOSIS — M1990 Unspecified osteoarthritis, unspecified site: Secondary | ICD-10-CM | POA: Diagnosis not present

## 2017-10-08 DIAGNOSIS — M109 Gout, unspecified: Secondary | ICD-10-CM | POA: Diagnosis not present

## 2017-10-08 DIAGNOSIS — I959 Hypotension, unspecified: Secondary | ICD-10-CM | POA: Diagnosis not present

## 2017-10-08 DIAGNOSIS — K222 Esophageal obstruction: Secondary | ICD-10-CM | POA: Diagnosis not present

## 2017-10-08 DIAGNOSIS — I5032 Chronic diastolic (congestive) heart failure: Secondary | ICD-10-CM | POA: Diagnosis not present

## 2017-10-09 DIAGNOSIS — M1990 Unspecified osteoarthritis, unspecified site: Secondary | ICD-10-CM | POA: Diagnosis not present

## 2017-10-09 DIAGNOSIS — K222 Esophageal obstruction: Secondary | ICD-10-CM | POA: Diagnosis not present

## 2017-10-09 DIAGNOSIS — M109 Gout, unspecified: Secondary | ICD-10-CM | POA: Diagnosis not present

## 2017-10-09 DIAGNOSIS — I959 Hypotension, unspecified: Secondary | ICD-10-CM | POA: Diagnosis not present

## 2017-10-09 DIAGNOSIS — I5032 Chronic diastolic (congestive) heart failure: Secondary | ICD-10-CM | POA: Diagnosis not present

## 2017-10-09 DIAGNOSIS — R1311 Dysphagia, oral phase: Secondary | ICD-10-CM | POA: Diagnosis not present

## 2017-10-14 DIAGNOSIS — K222 Esophageal obstruction: Secondary | ICD-10-CM | POA: Diagnosis not present

## 2017-10-14 DIAGNOSIS — M109 Gout, unspecified: Secondary | ICD-10-CM | POA: Diagnosis not present

## 2017-10-14 DIAGNOSIS — I5032 Chronic diastolic (congestive) heart failure: Secondary | ICD-10-CM | POA: Diagnosis not present

## 2017-10-14 DIAGNOSIS — I959 Hypotension, unspecified: Secondary | ICD-10-CM | POA: Diagnosis not present

## 2017-10-14 DIAGNOSIS — R1311 Dysphagia, oral phase: Secondary | ICD-10-CM | POA: Diagnosis not present

## 2017-10-14 DIAGNOSIS — M1990 Unspecified osteoarthritis, unspecified site: Secondary | ICD-10-CM | POA: Diagnosis not present

## 2017-10-15 DIAGNOSIS — M109 Gout, unspecified: Secondary | ICD-10-CM | POA: Diagnosis not present

## 2017-10-15 DIAGNOSIS — R1311 Dysphagia, oral phase: Secondary | ICD-10-CM | POA: Diagnosis not present

## 2017-10-15 DIAGNOSIS — K222 Esophageal obstruction: Secondary | ICD-10-CM | POA: Diagnosis not present

## 2017-10-15 DIAGNOSIS — M1990 Unspecified osteoarthritis, unspecified site: Secondary | ICD-10-CM | POA: Diagnosis not present

## 2017-10-15 DIAGNOSIS — I5032 Chronic diastolic (congestive) heart failure: Secondary | ICD-10-CM | POA: Diagnosis not present

## 2017-10-15 DIAGNOSIS — I959 Hypotension, unspecified: Secondary | ICD-10-CM | POA: Diagnosis not present

## 2017-10-16 DIAGNOSIS — I5032 Chronic diastolic (congestive) heart failure: Secondary | ICD-10-CM | POA: Diagnosis not present

## 2017-10-16 DIAGNOSIS — I959 Hypotension, unspecified: Secondary | ICD-10-CM | POA: Diagnosis not present

## 2017-10-16 DIAGNOSIS — M109 Gout, unspecified: Secondary | ICD-10-CM | POA: Diagnosis not present

## 2017-10-16 DIAGNOSIS — K222 Esophageal obstruction: Secondary | ICD-10-CM | POA: Diagnosis not present

## 2017-10-16 DIAGNOSIS — M1990 Unspecified osteoarthritis, unspecified site: Secondary | ICD-10-CM | POA: Diagnosis not present

## 2017-10-16 DIAGNOSIS — R1311 Dysphagia, oral phase: Secondary | ICD-10-CM | POA: Diagnosis not present

## 2017-10-17 DIAGNOSIS — M1990 Unspecified osteoarthritis, unspecified site: Secondary | ICD-10-CM | POA: Diagnosis not present

## 2017-10-17 DIAGNOSIS — M109 Gout, unspecified: Secondary | ICD-10-CM | POA: Diagnosis not present

## 2017-10-17 DIAGNOSIS — I5032 Chronic diastolic (congestive) heart failure: Secondary | ICD-10-CM | POA: Diagnosis not present

## 2017-10-17 DIAGNOSIS — R1311 Dysphagia, oral phase: Secondary | ICD-10-CM | POA: Diagnosis not present

## 2017-10-17 DIAGNOSIS — K222 Esophageal obstruction: Secondary | ICD-10-CM | POA: Diagnosis not present

## 2017-10-17 DIAGNOSIS — I959 Hypotension, unspecified: Secondary | ICD-10-CM | POA: Diagnosis not present

## 2017-10-17 MED ORDER — ONDANSETRON HCL 4 MG/2ML IJ SOLN: 4.0000 mg | Freq: Once | INTRAMUSCULAR | Status: DC

## 2017-10-18 ENCOUNTER — Other Ambulatory Visit: Payer: Self-pay

## 2017-10-18 ENCOUNTER — Encounter (HOSPITAL_COMMUNITY)
Admission: RE | Disposition: A | Discharge status: Home / Self Care | Payer: Self-pay | Source: Ambulatory Visit | Attending: Gastroenterology

## 2017-10-18 ENCOUNTER — Ambulatory Visit (HOSPITAL_COMMUNITY)
Admission: RE | Admit: 2017-10-18 | Discharge: 2017-10-18 | Disposition: A | Discharge status: Home / Self Care | Payer: Medicare Other | Source: Ambulatory Visit | Attending: Gastroenterology | Admitting: Gastroenterology

## 2017-10-18 ENCOUNTER — Encounter (HOSPITAL_COMMUNITY): Payer: Self-pay | Admitting: *Deleted

## 2017-10-18 DIAGNOSIS — Z86010 Personal history of colonic polyps: Secondary | ICD-10-CM | POA: Diagnosis not present

## 2017-10-18 DIAGNOSIS — I2510 Atherosclerotic heart disease of native coronary artery without angina pectoris: Secondary | ICD-10-CM | POA: Diagnosis not present

## 2017-10-18 DIAGNOSIS — Z955 Presence of coronary angioplasty implant and graft: Secondary | ICD-10-CM | POA: Diagnosis not present

## 2017-10-18 DIAGNOSIS — H5461 Unqualified visual loss, right eye, normal vision left eye: Secondary | ICD-10-CM | POA: Insufficient documentation

## 2017-10-18 DIAGNOSIS — K7460 Unspecified cirrhosis of liver: Secondary | ICD-10-CM | POA: Insufficient documentation

## 2017-10-18 DIAGNOSIS — K3189 Other diseases of stomach and duodenum: Secondary | ICD-10-CM | POA: Diagnosis not present

## 2017-10-18 DIAGNOSIS — K449 Diaphragmatic hernia without obstruction or gangrene: Secondary | ICD-10-CM | POA: Insufficient documentation

## 2017-10-18 DIAGNOSIS — K311 Adult hypertrophic pyloric stenosis: Secondary | ICD-10-CM | POA: Diagnosis not present

## 2017-10-18 DIAGNOSIS — Z96653 Presence of artificial knee joint, bilateral: Secondary | ICD-10-CM | POA: Insufficient documentation

## 2017-10-18 DIAGNOSIS — Z79899 Other long term (current) drug therapy: Secondary | ICD-10-CM | POA: Diagnosis not present

## 2017-10-18 DIAGNOSIS — E039 Hypothyroidism, unspecified: Secondary | ICD-10-CM | POA: Insufficient documentation

## 2017-10-18 DIAGNOSIS — Z96649 Presence of unspecified artificial hip joint: Secondary | ICD-10-CM | POA: Insufficient documentation

## 2017-10-18 DIAGNOSIS — Z7982 Long term (current) use of aspirin: Secondary | ICD-10-CM | POA: Insufficient documentation

## 2017-10-18 DIAGNOSIS — Z7902 Long term (current) use of antithrombotics/antiplatelets: Secondary | ICD-10-CM | POA: Diagnosis not present

## 2017-10-18 DIAGNOSIS — T39395A Adverse effect of other nonsteroidal anti-inflammatory drugs [NSAID], initial encounter: Secondary | ICD-10-CM

## 2017-10-18 DIAGNOSIS — K222 Esophageal obstruction: Secondary | ICD-10-CM

## 2017-10-18 DIAGNOSIS — R1310 Dysphagia, unspecified: Secondary | ICD-10-CM | POA: Diagnosis not present

## 2017-10-18 DIAGNOSIS — Z8041 Family history of malignant neoplasm of ovary: Secondary | ICD-10-CM | POA: Insufficient documentation

## 2017-10-18 DIAGNOSIS — K2970 Gastritis, unspecified, without bleeding: Secondary | ICD-10-CM

## 2017-10-18 DIAGNOSIS — Z8673 Personal history of transient ischemic attack (TIA), and cerebral infarction without residual deficits: Secondary | ICD-10-CM | POA: Insufficient documentation

## 2017-10-18 DIAGNOSIS — N183 Chronic kidney disease, stage 3 (moderate): Secondary | ICD-10-CM | POA: Diagnosis not present

## 2017-10-18 DIAGNOSIS — K766 Portal hypertension: Secondary | ICD-10-CM | POA: Insufficient documentation

## 2017-10-18 DIAGNOSIS — Z86718 Personal history of other venous thrombosis and embolism: Secondary | ICD-10-CM | POA: Insufficient documentation

## 2017-10-18 DIAGNOSIS — I4891 Unspecified atrial fibrillation: Secondary | ICD-10-CM | POA: Diagnosis not present

## 2017-10-18 DIAGNOSIS — I129 Hypertensive chronic kidney disease with stage 1 through stage 4 chronic kidney disease, or unspecified chronic kidney disease: Secondary | ICD-10-CM | POA: Diagnosis not present

## 2017-10-18 DIAGNOSIS — R112 Nausea with vomiting, unspecified: Secondary | ICD-10-CM | POA: Insufficient documentation

## 2017-10-18 DIAGNOSIS — K2960 Other gastritis without bleeding: Secondary | ICD-10-CM

## 2017-10-18 DIAGNOSIS — K2950 Unspecified chronic gastritis without bleeding: Secondary | ICD-10-CM | POA: Diagnosis not present

## 2017-10-18 DIAGNOSIS — F1020 Alcohol dependence, uncomplicated: Secondary | ICD-10-CM | POA: Insufficient documentation

## 2017-10-18 HISTORY — PX: MALONEY DILATION: SHX5535

## 2017-10-18 HISTORY — PX: SAVORY DILATION: SHX5439

## 2017-10-18 HISTORY — PX: ESOPHAGOGASTRODUODENOSCOPY: SHX5428

## 2017-10-18 SURGERY — EGD (ESOPHAGOGASTRODUODENOSCOPY)
Anesthesia: Moderate Sedation

## 2017-10-18 MED ORDER — LIDOCAINE VISCOUS 2 % MT SOLN
OROMUCOSAL | Status: AC
  Filled 2017-10-18: qty 15

## 2017-10-18 MED ORDER — MINERAL OIL PO OIL
TOPICAL_OIL | ORAL | Status: AC
  Filled 2017-10-18: qty 30

## 2017-10-18 MED ORDER — MEPERIDINE HCL 100 MG/ML IJ SOLN
INTRAMUSCULAR | Status: DC | PRN
  Administered 2017-10-18: 50 mg via INTRAVENOUS
  Administered 2017-10-18: 25 mg via INTRAVENOUS

## 2017-10-18 MED ORDER — MIDAZOLAM HCL 5 MG/5ML IJ SOLN
INTRAMUSCULAR | Status: DC | PRN
  Administered 2017-10-18: 1 mg via INTRAVENOUS

## 2017-10-18 MED ORDER — MIDAZOLAM HCL 5 MG/5ML IJ SOLN
INTRAMUSCULAR | Status: DC | PRN
  Administered 2017-10-18 (×2): 2 mg via INTRAVENOUS

## 2017-10-18 MED ORDER — SODIUM CHLORIDE 0.9 % IV SOLN
INTRAVENOUS | Status: DC
  Administered 2017-10-18: 14:00:00 via INTRAVENOUS

## 2017-10-18 MED ORDER — MIDAZOLAM HCL 5 MG/5ML IJ SOLN
INTRAMUSCULAR | Status: AC
  Filled 2017-10-18: qty 10

## 2017-10-18 MED ORDER — METOCLOPRAMIDE HCL 5 MG PO TABS: ORAL_TABLET | ORAL | 11 refills | Status: AC

## 2017-10-18 MED ORDER — ONDANSETRON HCL 4 MG/2ML IJ SOLN
INTRAMUSCULAR | Status: AC
  Filled 2017-10-18: qty 2

## 2017-10-18 MED ORDER — LIDOCAINE VISCOUS 2 % MT SOLN
OROMUCOSAL | Status: DC | PRN
  Administered 2017-10-18: 1 via OROMUCOSAL

## 2017-10-18 MED ORDER — MEPERIDINE HCL 100 MG/ML IJ SOLN
INTRAMUSCULAR | Status: AC
  Filled 2017-10-18: qty 2

## 2017-10-18 NOTE — Op Note (Signed)
[facility] Patient Name: Justin Parsons Procedure Date: 10/18/2017 2:16 PM MRN: [id number] Date of Birth: 20-Apr-1941 Attending MD: Barney Drain MD, MD CSN: [id number] Age: 76 Admit Type: Outpatient Procedure:                Upper GI endoscopy WITH ESOPHAGEAL/BALLOON DILATION                            & COLD FORCEPS BIOPSY Indications:              Dysphagia, Nausea with vomiting Providers:                Barney Drain MD, MD, Janeece Riggers, RN, Lurline Del, RN Referring MD:             Fuller Canada. Manuella Ghazi MD, MD Medicines:                Midazolam 4 mg IV, Meperidine 75 mg IV, Ondansetron                            4 mg IV Complications:            No immediate complications. Estimated Blood Loss:     Estimated blood loss was minimal. Procedure:                Pre-Anesthesia Assessment:                           - Prior to the procedure, a History and Physical                            was performed, and patient medications and                            allergies were reviewed. The patient's tolerance of                            previous anesthesia was also reviewed. The risks                            and benefits of the procedure and the sedation                            options and risks were discussed with the patient.                            All questions were answered, and informed consent                            was obtained. Prior Anticoagulants: The patient                            last took aspirin 1 day and Plavix (clopidogrel) 1                            day prior to the procedure. ASA Grade Assessment:  III - A patient with severe systemic disease. After                            reviewing the risks and benefits, the patient was                            deemed in satisfactory condition to undergo the                            procedure. After obtaining informed consent, the                            endoscope was passed  under direct vision.                            Throughout the procedure, the patient's blood                            pressure, pulse, and oxygen saturations were                            monitored continuously. The [id number] ([id number])                            scope was introduced through the mouth, and                            advanced to the second part of duodenum. The upper                            GI endoscopy was somewhat difficult due to the                            patient's agitation. Successful completion of the                            procedure was aided by increasing the dose of                            sedation medication. The patient tolerated the                            procedure fairly well. Scope In: 2:43:03 PM Scope Out: 2:58:15 PM Total Procedure Duration: 0 hours 15 minutes 12 seconds  Findings:      One moderate (circumferential scarring or stenosis; an endoscope may       pass) benign-appearing, intrinsic stenosis was found. This measured 1.4       cm (inner diameter) and was traversed. A guidewire was placed and the       scope was withdrawn. Dilation was performed with a Savary dilator with       mild resistance at 16 mm and 17 mm and moderate resistance at 15 mm.       Estimated blood loss was minimal.      Diffuse mild  inflammation characterized by congestion (edema) and       erythema was found in the entire examined stomach. Biopsies were taken       with a cold forceps for Helicobacter pylori testing.      A benign-appearing, intrinsic moderate stenosis was found at the       pylorus. This was traversed. A TTS dilator was passed through the scope.       Dilation with a 12-13.5-15 mm pyloric balloon dilator was performed.      The examined duodenum was normal. 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 Moderate Sedation:      Moderate (conscious) sedation was administered by the endoscopy nurse       and supervised by the endoscopist. The following parameters were       monitored: oxygen saturation, heart rate, blood pressure, and response       to care. Total physician intraservice time was 24 minutes. Recommendation:           - Diabetic (ADA) diet and gastroparesis diet.                           - Continue present medications. ADD REGLAN BID.                           - Await pathology results.                           - Return to my office in 4 months.                           - Patient has a contact number available for                            emergencies. The signs and symptoms of potential                            delayed complications were discussed with the                            patient. Return to normal activities tomorrow.                            Written discharge instructions were provided to the                            patient. Procedure Code(s):        --- Professional ---                           [phone], Esophagogastroduodenoscopy, flexible,  transoral; with dilation of gastric/duodenal                            stricture(s) (eg, balloon, bougie)                           43248, Esophagogastroduodenoscopy, flexible,                            transoral; with insertion of guide wire followed by                            passage of dilator(s) through esophagus over guide                            wire                           43239, Esophagogastroduodenoscopy, flexible,                            transoral; with biopsy, single or multiple                           99152, Moderate sedation services provided by the                            same physician or other qualified health care                            professional performing the diagnostic or                            therapeutic service that the  sedation supports,                            requiring the presence of an independent trained                            observer to assist in the monitoring of the                            patient's level of consciousness and physiological                            status; initial 15 minutes of intraservice time,                            patient age 58 years or older                           [phone], Moderate sedation services; each additional                            15 minutes intraservice time Diagnosis Code(s):        --- Professional ---  K22.2, Esophageal obstruction                           K29.70, Gastritis, unspecified, without bleeding                           K31.1, Adult hypertrophic pyloric stenosis                           R13.10, Dysphagia, unspecified                           R11.2, Nausea with vomiting, unspecified CPT copyright 2016 American [employer] Association. All rights reserved. The codes documented in this report are preliminary and upon coder review may  be revised to meet current compliance requirements. Barney Drain, MD Barney Drain MD, MD 10/18/2017 3:09:20 PM This report has been signed electronically. Number of Addenda: 0

## 2017-10-18 NOTE — Discharge Instructions (Signed)
YOUR NAUSEA AND VOMITING ARE DUE AN ESOPHAGEAL STRICTURE/GASTRITIS, AND PYLORIC STENOSIS. I STRETCHED YOUR ESOPHAGUS AND YOUR PYLORUS. I BIOPSIED YOUR STOMACH.     STRICTLY FOLLOW A SOFT/DIABETIC/GASTROPARESIS DIET. MEATS SHOULD BE CHOPPED OR GROUND ONLY. DO NOT EAT CHUNKS OF ANYTHING. AVOID FRIED FOODS. SEE HANDOUT.  START REGLAN 5 MG 30 MINS PRIOR TO BREAKFAST AND LUNCH. IT MAY CAUSE AGITATION, DRAINAGE FROM HER BREAST, INVOLUNTARY ,OVEMENTS OF FACE AND NECK, OR CHANGES IN VISION.  CONTINUE OMEPRAZOLE. TO PREVENT STRICTURES AND TO TREAT GASTRITIS.  YOUR BIOPSY RESULTS WILL BE AVAILABLE IN MY CHART AFTER DEC 4 AND  MY OFFICE WILL CONTACT YOU IN 10-14 DAYS WITH YOUR RESULTS.   PLEASE CALL IN ONE MONTH IF SYMPTOMS ARE NOT IMPROVED.   FOLLOW UP IN FEB 2019 WITH DR. Bentlee Drier.   UPPER ENDOSCOPY AFTER CARE Read the instructions outlined below and refer to this sheet in the next week. These discharge instructions provide you with general information on caring for yourself after you leave the hospital. While your treatment has been planned according to the most current medical practices available, unavoidable complications occasionally occur. If you have any problems or questions after discharge, call DR. Aaliyana Fredericks, [phone].  ACTIVITY  You may resume your regular activity, but move at a slower pace for the next 24 hours.   Take frequent rest periods for the next 24 hours.   Walking will help get rid of the air and reduce the bloated feeling in your belly (abdomen).   No driving for 24 hours (because of the medicine (anesthesia) used during the test).   You may shower.   Do not sign any important legal documents or operate any machinery for 24 hours (because of the anesthesia used during the test).    NUTRITION  Drink plenty of fluids.   You may resume your normal diet as instructed by your doctor.   Begin with a light meal and progress to your normal diet. Heavy or fried foods are  harder to digest and may make you feel sick to your stomach (nauseated).   Avoid alcoholic beverages for 24 hours or as instructed.    MEDICATIONS  You may resume your normal medications.   WHAT YOU CAN EXPECT TODAY  Some feelings of bloating in the abdomen.   Passage of more gas than usual.    IF YOU HAD A BIOPSY TAKEN DURING THE UPPER ENDOSCOPY:  Eat a soft diet IF YOU HAVE NAUSEA, BLOATING, ABDOMINAL PAIN, OR VOMITING.    FINDING OUT THE RESULTS OF YOUR TEST Not all test results are available during your visit. DR. Oneida Alar WILL CALL YOU WITHIN 14 DAYS OF YOUR PROCEDUE WITH YOUR RESULTS. Do not assume everything is normal if you have not heard from DR. Makayah Pauli. CALL HER OFFICE AT [phone].  SEEK IMMEDIATE MEDICAL ATTENTION AND CALL THE OFFICE: [phone] IF:  You have more than a spotting of blood in your stool.   Your belly is swollen (abdominal distention).   You are nauseated or vomiting.   You have a temperature over 101F.   You have abdominal pain or discomfort that is severe or gets worse throughout the day.     Gastritis  Gastritis is an inflammation (the body's way of reacting to injury and/or infection) of the stomach. It is often caused by bacterial (germ) infections. It can also be caused BY ASPIRIN, BC/GOODY POWDER'S, (IBUPROFEN) MOTRIN, OR ALEVE (NAPROXEN), chemicals (including alcohol), SPICY FOODS, and medications. This illness may be associated with generalized  malaise (feeling tired, not well), UPPER ABDOMINAL STOMACH cramps, and fever. One common bacterial cause of gastritis is an organism known as H. Pylori. This can be treated with antibiotics.    Gastroparesis  Gastroparesis is also called slowed stomach emptying (delayed gastric emptying). It is a condition in which the stomach takes too long to empty its contents. It often happens in people with diabetes.    RISK FACTORS  Diabetes.   Post-viral syndromes.   Surgery on the stomach or  vagus nerve.   Gastroesophageal reflux disease (rarely).  SYMPTOMS   Heartburn.   Feeling sick to your stomach (nausea).   Vomiting of undigested food.   An early feeling of fullness when eating.   Weight loss.   Abdominal bloating.   Erratic blood glucose levels.   Lack of appetite.   Gastroesophageal reflux.   Spasms of the stomach wall.  Complications can include:  Bacterial overgrowth in stomach. Food stays in the stomach and can ferment and cause bacteria to grow.   Weight loss due to difficulty digesting and absorbing nutrients.   Vomiting.   Obstruction in the stomach. Undigested food can harden and cause nausea and vomiting.   Blood glucose fluctuations caused by inconsistent food absorption.    TREATMENT   The primary treatment is to identify the problem and help control blood glucose levels. Treatments include:   Exercise.   Medicines to control nausea and vomiting.   Medicines to stimulate stomach muscles.   Changes in what and when you eat.   Having smaller meals more often.   Eating low-fiber forms of high-fiber foods, such as eating cooked vegetables instead of raw vegetables.   Eating low-fat foods.   Consuming liquids, which are easier to digest.   In severe cases, feeding tubes and intravenous (IV) feeding may be needed.  It is important to note that in most cases, treatment does not cure gastroparesis. It is usually a lasting (chronic) condition. Treatment helps you manage the condition so that you can be as healthy and comfortable as possible.  Gastroparesis Diet for Delayed Stomach Emptying Author: Westley Gambles, M.D.  Purpose Gastroparesis is the medical term for delayed stomach emptying. During the process of digestion, the stomach must contract to empty itself of food and liquid. Normally, it contracts about three times a minute. This empties the stomach within 90-120 minutes after eating. If contractions are sluggish or less  frequent, stomach emptying is delayed. This results in bothersome and sometimes serious symptoms, as well as malnutrition, because food is not being digested properly. Gastroparesis may be caused by various conditions such as diabetes mellitus, certain disorders of the nervous system, or certain drugs. Often however, no cause can be found although a viral infection is suspected in some. Usually, the physician prescribes medication to stimulate the stomach to contract. The purpose of the gastroparesis diet is to reduce symptoms and maintain adequate fluids and nutrition. There are three steps to the diet. STEP 1 DIET consists of liquids, which usually leave the stomach quickly by gravity alone. Liquids prevent dehydration and keep the body supplied with vital salts and minerals. STEP 2 DIET provides additional calories by adding a small amount of dietary fat -- less than 40 gm each day. For patients with gastroparesis, fatty foods and oils should be restricted, because they delay stomach emptying. However, patients at the Step 2 level are usually able to tolerate this amount. STEP 3 DIET is designed for long-term maintenance. Fat is limited to  50 gm per day, and fibrous foods are restricted, because many plant fibers cannot be digested. Nutrition Facts The STEP 1 Gastroparesis Diet is inadequate in all nutrients except sodium and potassium. It should not be continued for more than three days without additional nutritional support. STEP 2 and STEP 3 Gastroparesis Diets may be inadequate in Vitamins A and C, and the mineral iron. A multi-vitamin supplement is usually prescribed. Special Considerations  1. Diets must be tailored to the individual patient. This is because the degree of gastroparesis may range from severe and long-standing to mild and easily corrected. Patients may also have various medical conditions to be considered. For example, diabetes patients with gastroparesis are allowed sugar-containing  liquids on the Step 1 diet, because that is their only source of carbohydrate. On the Step 2 and Step 3 diets, these patients should avoid concentrated sweets. These are noted with an asterisk (*) on the food lists. 2. On all of the diets, liquids and food should be eaten in small, frequent meals. This helps to maintain nutrition.   Step 1 Food Groups  Group Recommend Avoid  Milk & milk products none all  Vegetables none all  Fruits none all  Breads & grains plain saltine crackers all others  Meat or meat substitutes none all  Fats & oils none all  Beverages Gatorade and soft drinks (sipped slowly throughout the day) all others  Soups fat-free consomm and bouillon all others   Sample Menu Step 1  Breakfast Lunch Dinner  Gatorade 1/2 cup  ginger ale 1/2 cup  bouillon 3/4 cup  saltine crackers 6 Gatorade 1/2 cup  Coke 1/2 cup  bouillon 3/4 cup  saltine crackers 6 Gatorade 1/2 cup  Sprite 1/2 cup  bouillon 3/4 cup  saltine crackers 6    Step 2 Food Groups  Group Recommend Avoid  Milk & milk products skim milk, products made with skim milk, low-fat yogurt, low-fat cheeses whole milk products, creams (sour, light, heavy, whipping), half & half  Soups fat-free consomm & bouillon, soups made from skim milk, & fat-free broths containing pasta or noodles and allowed vegetables soups made with cream, whole milk, or broths containing fat  Bread & grains  breads & cereals, cream of wheat, pasta, white rice, egg noodles, low-fat crackers oatmeal; whole grain rice, cereal, bread  Meat & meat substitutes eggs, peanut butter (maximum 2 Tbsp/day) beef; poultry; fish; pork products; dried beans, pea, & lentils  Vegetables vegetable juice (tomato, V-8); well-cooked vegetables without skins (acorn squash, beets, carrots, mushrooms, potatoes, spinach, summer squash, strained tomato sauce, yams) all raw vegetables; cooked vegetables with skins; beans (green, wax, lima), broccoli, Brussels sprouts,  cabbage, cauliflower, celery, corn, eggplant, onions, peas, peppers, pea pods, sauerkraut, turnips, water chestnuts, zucchini  Fruits apple juice, cranberry juice, grape juice, pineapple, prune juice, canned fruits without skins (applesauce, peaches, pears) citrus juices, all fresh and dried fruits, canned fruits with skins (apricots, cherries, blueberries, fruit cocktail, oranges, grapefruit, pineapple, plums, persimmons)  Fats & oils any type of fat, but only in small amounts none  Sweets & desserts* hard candies, caramels, puddings & custards made from skim milk, frozen yogurt, fruit ice, gelatin, ice milk, jelly, honey, syrups high-fat desserts (cakes, pies, cookies, pastries, ice cream), fruit preserves  Beverages Gatorade*, soft drinks* (sipped slowly throughout the day) all others, except allowed juices  *Concentrated sweets   Sample Menu Step 2  Breakfast Lunch Dinner  skim milk 1/2 cup  poached egg 1  white toast  slice  apple juice 1/2 cup mozzarella cheese 2 oz  saltine crackers 6  chicken noodle soup 3/4 cup  Gatorade 1/2 cup peanut butter 1 Tbsp  saltine crackers 6  vanilla pudding 1/2 cup  grape juice 1/2 cup  Morning Snack Afternoon Snack Evening Snack  ginger ale 1/2 cup  canned pears 1/2 cup skim milk 1/2 cup  cornflakes 1/2 cup  sugar 2 tsp frozen yogurt 1/2 cup  saltine crackers 6    Step 3 Food Groups  Group Recommend Avoid  Milk & milk products skim milk, products made with skim milk, low-fat yogurt, low-fat cheeses whole milk products, creams (sour, light, heavy, whipping), half & half  Soups fat-free consomm & bouillon, soups made from skim milk, & fat-free broths containing pasta or noodles and allowed vegetables soups made with cream, whole milk, or broths containing fat  Fruits  fruit juices, canned fruits without skins (applesauce, peaches, pears) all fresh & dried fruits, canned fruits with skins (apricots, cherries, plums, blueberries, fruit cocktail,  oranges, grapefruit, pineapple, persimmons)  Meat & meat substitutes eggs, peanut butter (2 Tbsp/day), poultry, fish, lean ground beef fibrous meats (steaks, roasts, chops), dried beans, peas, lentils  Fats & oils any type of fat, but only in small amounts none  Breads & grains breads & cereals, cream of wheat, pasta, white rice, egg noodles, low-fat crackers oatmeal; whole grain rice, cereal, bread  Vegetables vegetable juices (tomato V-8), well-cooked vegetables without skins (acorn squash, beets, carrots, mushrooms, potatoes, spinach, summers quash, strained tomato sauce, yams) all raw vegetables; cooked vegetables with skins: beans (green, wax, lima), broccoli, Brussels sprouts, cabbage, cauliflower, celery, corn, eggplant, onions, peas, peppers, pea pods, sauerkraut, turnips, water chestnuts, zucchini  Sweets & desserts* hard candies, caramels, puddings & custards made from skim milk, frozen yogurt, fruit ice, gelatin, ice milk, jelly, honey, syrups high-fat desserts (cakes, pies, cookies, pastries, ice cream), fruit preserves  Beverages Gatorade*, soft drinks* (sipped slowly throughout the day), coffee, tea, water (note: non-caloric beverages should be limited if patient cannot maintain adequate caloric intake) all others, except allowed juices  *Concentrated sweets   Sample Menu Step 3  Breakfast Lunch Dinner  skim milk 1/2 cup  cream of wheat 1/2 cup  sugar 2 tsp  orange juice 1/2 cup  white toast 1 slice  margarine 1 tsp  jelly 1 Tbsp tuna fish 2 oz  low-fat mayonnaise 2 Tbsp  white bread 2 slices  canned peaches 1/2 cup  Gatorade 1.2 cup baked chicken 2 oz  white rice 1/2 cup  cooked beets 1/2 cup  dinner roll 1  skim milk 1/2 cup  margarine 2 tsp  Morning Snack Afternoon Snack Evening Snack  low-fat yogurt 1/2 cup  Sprite 1/2 cup chocolate pudding 1/2 cup  gingerale 1/2 cup ice milk 1/2 cup  pretzels 2

## 2017-10-18 NOTE — Interval H&P Note (Signed)
History and Physical Interval Note:  10/18/2017 2:25 PM  Justin Parsons  has presented today for surgery, with the diagnosis of Esophageal Stricture  The various methods of treatment have been discussed with the patient and family. After consideration of risks, benefits and other options for treatment, the patient has consented to  Procedure(s) with comments: ESOPHAGOGASTRODUODENOSCOPY (EGD) (N/A) - 2:15pm SAVORY DILATION (N/A) MALONEY DILATION (N/A) as a surgical intervention .  The patient's history has been reviewed, patient examined, no change in status, stable for surgery.  I have reviewed the patient's chart and labs.  Questions were answered to the patient's satisfaction.     [employer]

## 2017-10-22 DIAGNOSIS — I5032 Chronic diastolic (congestive) heart failure: Secondary | ICD-10-CM | POA: Diagnosis not present

## 2017-10-22 DIAGNOSIS — K222 Esophageal obstruction: Secondary | ICD-10-CM | POA: Diagnosis not present

## 2017-10-22 DIAGNOSIS — M109 Gout, unspecified: Secondary | ICD-10-CM | POA: Diagnosis not present

## 2017-10-22 DIAGNOSIS — R1311 Dysphagia, oral phase: Secondary | ICD-10-CM | POA: Diagnosis not present

## 2017-10-22 DIAGNOSIS — I959 Hypotension, unspecified: Secondary | ICD-10-CM | POA: Diagnosis not present

## 2017-10-22 DIAGNOSIS — M1990 Unspecified osteoarthritis, unspecified site: Secondary | ICD-10-CM | POA: Diagnosis not present

## 2017-10-23 ENCOUNTER — Encounter (HOSPITAL_COMMUNITY): Payer: Self-pay | Admitting: Gastroenterology

## 2017-10-23 DIAGNOSIS — I5032 Chronic diastolic (congestive) heart failure: Secondary | ICD-10-CM | POA: Diagnosis not present

## 2017-10-23 DIAGNOSIS — M1990 Unspecified osteoarthritis, unspecified site: Secondary | ICD-10-CM | POA: Diagnosis not present

## 2017-10-23 DIAGNOSIS — M109 Gout, unspecified: Secondary | ICD-10-CM | POA: Diagnosis not present

## 2017-10-23 DIAGNOSIS — K222 Esophageal obstruction: Secondary | ICD-10-CM | POA: Diagnosis not present

## 2017-10-23 DIAGNOSIS — I959 Hypotension, unspecified: Secondary | ICD-10-CM | POA: Diagnosis not present

## 2017-10-23 DIAGNOSIS — R1311 Dysphagia, oral phase: Secondary | ICD-10-CM | POA: Diagnosis not present

## 2017-10-24 DIAGNOSIS — M109 Gout, unspecified: Secondary | ICD-10-CM | POA: Diagnosis not present

## 2017-10-24 DIAGNOSIS — K222 Esophageal obstruction: Secondary | ICD-10-CM | POA: Diagnosis not present

## 2017-10-24 DIAGNOSIS — R1311 Dysphagia, oral phase: Secondary | ICD-10-CM | POA: Diagnosis not present

## 2017-10-24 DIAGNOSIS — M1990 Unspecified osteoarthritis, unspecified site: Secondary | ICD-10-CM | POA: Diagnosis not present

## 2017-10-24 DIAGNOSIS — I5032 Chronic diastolic (congestive) heart failure: Secondary | ICD-10-CM | POA: Diagnosis not present

## 2017-10-24 DIAGNOSIS — I959 Hypotension, unspecified: Secondary | ICD-10-CM | POA: Diagnosis not present

## 2017-10-31 DIAGNOSIS — R1311 Dysphagia, oral phase: Secondary | ICD-10-CM | POA: Diagnosis not present

## 2017-10-31 DIAGNOSIS — M109 Gout, unspecified: Secondary | ICD-10-CM | POA: Diagnosis not present

## 2017-10-31 DIAGNOSIS — I959 Hypotension, unspecified: Secondary | ICD-10-CM | POA: Diagnosis not present

## 2017-10-31 DIAGNOSIS — K222 Esophageal obstruction: Secondary | ICD-10-CM | POA: Diagnosis not present

## 2017-10-31 DIAGNOSIS — M1990 Unspecified osteoarthritis, unspecified site: Secondary | ICD-10-CM | POA: Diagnosis not present

## 2017-10-31 DIAGNOSIS — I5032 Chronic diastolic (congestive) heart failure: Secondary | ICD-10-CM | POA: Diagnosis not present

## 2017-11-01 DIAGNOSIS — I5032 Chronic diastolic (congestive) heart failure: Secondary | ICD-10-CM | POA: Diagnosis not present

## 2017-11-01 DIAGNOSIS — M109 Gout, unspecified: Secondary | ICD-10-CM | POA: Diagnosis not present

## 2017-11-01 DIAGNOSIS — R1311 Dysphagia, oral phase: Secondary | ICD-10-CM | POA: Diagnosis not present

## 2017-11-01 DIAGNOSIS — K222 Esophageal obstruction: Secondary | ICD-10-CM | POA: Diagnosis not present

## 2017-11-01 DIAGNOSIS — I959 Hypotension, unspecified: Secondary | ICD-10-CM | POA: Diagnosis not present

## 2017-11-01 DIAGNOSIS — M1990 Unspecified osteoarthritis, unspecified site: Secondary | ICD-10-CM | POA: Diagnosis not present

## 2017-11-07 DIAGNOSIS — M1990 Unspecified osteoarthritis, unspecified site: Secondary | ICD-10-CM | POA: Diagnosis not present

## 2017-11-07 DIAGNOSIS — M109 Gout, unspecified: Secondary | ICD-10-CM | POA: Diagnosis not present

## 2017-11-07 DIAGNOSIS — K222 Esophageal obstruction: Secondary | ICD-10-CM | POA: Diagnosis not present

## 2017-11-07 DIAGNOSIS — I959 Hypotension, unspecified: Secondary | ICD-10-CM | POA: Diagnosis not present

## 2017-11-07 DIAGNOSIS — I5032 Chronic diastolic (congestive) heart failure: Secondary | ICD-10-CM | POA: Diagnosis not present

## 2017-11-07 DIAGNOSIS — R1311 Dysphagia, oral phase: Secondary | ICD-10-CM | POA: Diagnosis not present

## 2017-11-14 ENCOUNTER — Ambulatory Visit: Payer: Medicare Other | Admitting: Nurse Practitioner

## 2017-11-20 ENCOUNTER — Telehealth: Payer: Self-pay | Admitting: Internal Medicine

## 2017-11-20 ENCOUNTER — Encounter: Payer: Self-pay | Admitting: Internal Medicine

## 2017-11-20 NOTE — Telephone Encounter (Signed)
Spoke with pt. Pt has lost 10-15 more pounds since he was last seen 09/2017. Pt reports that after his Endoscopy with Dr. Oneida Alar, he was able to eat solid foods for about 2 weeks. After that, pt has continued to have a loss of appetite, vomitting when eating or smelling any food. Pt has bowel movements qod due to not eating. Pt doesn't have severe pain in is abdomen, he says he can feel a mild irritation in abdomen. Pt is concerned something more serious is going on with him. Pt reports no dizziness, and says he can move around but it takes him a long time since he doesn't have any food in him. Pt was advised to go to the ED if his symptoms worsen and he gets weaker or vomits uncontrollably.

## 2017-11-20 NOTE — Telephone Encounter (Signed)
Pt called asking to speak to the nurse. He was seen recently and said he is starting to vomit again.

## 2017-11-20 NOTE — Telephone Encounter (Signed)
Noted called pt. See other note.

## 2017-11-20 NOTE — Telephone Encounter (Signed)
Tried calling pt. No voicemail can be left.

## 2017-11-20 NOTE — Telephone Encounter (Signed)
Pt is listed as a RMR pt, but it looks like SF has done the past few procedures on him. Please clarify if he is a RMR or SF patient. Also, patient called asking to speak to the nurse. He said that he has started back to vomiting again and he is scheduled his 4 month follow up in March. Please advise if he needs to be seen sooner. [phone]

## 2017-11-20 NOTE — Telephone Encounter (Signed)
Pt called asking for the nurse. I told him she was at lunch and I could have her call him back. He said that he has started vomiting again and doesn't know what to do. Pt has a 4 month follow on 3/25 at 1030 with EG. Please advise if he needs to be seen sooner or can we call something in for him. He said he could be reached at [phone]  (I'm not sure if he is a RMR or SF patient)

## 2017-11-26 NOTE — Telephone Encounter (Signed)
Can we find out if he's taking the Reglan as recommended?  Also, have him start a full liquid diet for now and see if that helps him be able to eat some while we discuss further recommendations.

## 2017-11-27 NOTE — Telephone Encounter (Signed)
Pt is taking Reglan before he eats his meals daily.  Pt is drinking V8 juice, Boost, eating jello or pudding. When pt smells food, it makes him vomit and he isn't able to taste the food put in his mouth. Pt also mention that his appointment was pushed out to 01/2018. Pt feels that's too long to be seen in our office.

## 2017-11-28 NOTE — Telephone Encounter (Signed)
Can we have him seen by someone any sooner? Ok to use Urgent.

## 2017-11-28 NOTE — Telephone Encounter (Signed)
PATIENT RESCHEDULED FOR Tuesday 12/03/17

## 2017-12-03 ENCOUNTER — Encounter: Payer: Self-pay | Admitting: *Deleted

## 2017-12-03 ENCOUNTER — Emergency Department (HOSPITAL_COMMUNITY)
Admission: EM | Admit: 2017-12-03 | Discharge: 2017-12-03 | Disposition: A | Discharge status: Home / Self Care | Payer: Medicare Other | Attending: Emergency Medicine | Admitting: Emergency Medicine

## 2017-12-03 ENCOUNTER — Telehealth: Payer: Self-pay | Admitting: *Deleted

## 2017-12-03 ENCOUNTER — Encounter: Payer: Self-pay | Admitting: Gastroenterology

## 2017-12-03 ENCOUNTER — Encounter (HOSPITAL_COMMUNITY): Payer: Self-pay | Admitting: Emergency Medicine

## 2017-12-03 ENCOUNTER — Other Ambulatory Visit: Payer: Self-pay | Admitting: *Deleted

## 2017-12-03 ENCOUNTER — Ambulatory Visit (INDEPENDENT_AMBULATORY_CARE_PROVIDER_SITE_OTHER): Payer: Medicare Other | Admitting: Gastroenterology

## 2017-12-03 ENCOUNTER — Other Ambulatory Visit: Payer: Self-pay

## 2017-12-03 VITALS — BP 90/72 | HR 58 | Temp 96.7°F | Ht 68.0 in

## 2017-12-03 DIAGNOSIS — F0390 Unspecified dementia without behavioral disturbance: Secondary | ICD-10-CM | POA: Diagnosis not present

## 2017-12-03 DIAGNOSIS — K222 Esophageal obstruction: Secondary | ICD-10-CM

## 2017-12-03 DIAGNOSIS — N183 Chronic kidney disease, stage 3 (moderate): Secondary | ICD-10-CM | POA: Diagnosis not present

## 2017-12-03 DIAGNOSIS — M546 Pain in thoracic spine: Secondary | ICD-10-CM | POA: Insufficient documentation

## 2017-12-03 DIAGNOSIS — E039 Hypothyroidism, unspecified: Secondary | ICD-10-CM | POA: Insufficient documentation

## 2017-12-03 DIAGNOSIS — I2510 Atherosclerotic heart disease of native coronary artery without angina pectoris: Secondary | ICD-10-CM | POA: Insufficient documentation

## 2017-12-03 DIAGNOSIS — M549 Dorsalgia, unspecified: Secondary | ICD-10-CM

## 2017-12-03 DIAGNOSIS — Z79899 Other long term (current) drug therapy: Secondary | ICD-10-CM | POA: Insufficient documentation

## 2017-12-03 DIAGNOSIS — I129 Hypertensive chronic kidney disease with stage 1 through stage 4 chronic kidney disease, or unspecified chronic kidney disease: Secondary | ICD-10-CM | POA: Diagnosis not present

## 2017-12-03 DIAGNOSIS — Z7901 Long term (current) use of anticoagulants: Secondary | ICD-10-CM | POA: Insufficient documentation

## 2017-12-03 DIAGNOSIS — Z7982 Long term (current) use of aspirin: Secondary | ICD-10-CM | POA: Diagnosis not present

## 2017-12-03 DIAGNOSIS — I959 Hypotension, unspecified: Secondary | ICD-10-CM | POA: Diagnosis not present

## 2017-12-03 LAB — COMPREHENSIVE METABOLIC PANEL
ALK PHOS: 83 U/L (ref 38–126)
ALT: 12 U/L — AB (ref 17–63)
AST: 24 U/L (ref 15–41)
Albumin: 3.7 g/dL (ref 3.5–5.0)
Anion gap: 11 (ref 5–15)
BUN: 22 mg/dL — ABNORMAL HIGH (ref 6–20)
CALCIUM: 9.7 mg/dL (ref 8.9–10.3)
CO2: 27 mmol/L (ref 22–32)
CREATININE: 1.53 mg/dL — AB (ref 0.61–1.24)
Chloride: 96 mmol/L — ABNORMAL LOW (ref 101–111)
GFR calc non Af Amer: 42 mL/min — ABNORMAL LOW (ref >60)
GFR, EST AFRICAN AMERICAN: 49 mL/min — AB (ref >60)
Glucose, Bld: 119 mg/dL — ABNORMAL HIGH (ref 65–99)
Potassium: 3.8 mmol/L (ref 3.5–5.1)
SODIUM: 134 mmol/L — AB (ref 135–145)
Total Bilirubin: 1.2 mg/dL (ref 0.3–1.2)
Total Protein: 7.5 g/dL (ref 6.5–8.1)

## 2017-12-03 LAB — CBC WITH DIFFERENTIAL/PLATELET
BASOS PCT: 0 %
Basophils Absolute: 0.1 10*3/uL (ref 0.0–0.1)
EOS ABS: 0.2 10*3/uL (ref 0.0–0.7)
Eosinophils Relative: 2 %
HCT: 43.7 % (ref 39.0–52.0)
HEMOGLOBIN: 14.4 g/dL (ref 13.0–17.0)
Lymphocytes Relative: 13 %
Lymphs Abs: 1.6 10*3/uL (ref 0.7–4.0)
MCH: 29.3 pg (ref 26.0–34.0)
MCHC: 33 g/dL (ref 30.0–36.0)
MCV: 88.8 fL (ref 78.0–100.0)
MONO ABS: 1.2 10*3/uL — AB (ref 0.1–1.0)
MONOS PCT: 10 %
NEUTROS PCT: 75 %
Neutro Abs: 9.4 10*3/uL — ABNORMAL HIGH (ref 1.7–7.7)
Platelets: 147 10*3/uL — ABNORMAL LOW (ref 150–400)
RBC: 4.92 MIL/uL (ref 4.22–5.81)
RDW: 18.7 % — ABNORMAL HIGH (ref 11.5–15.5)
WBC: 12.5 10*3/uL — ABNORMAL HIGH (ref 4.0–10.5)

## 2017-12-03 LAB — TROPONIN I: Troponin I: <0.03 ng/mL (ref <0.03)

## 2017-12-03 MED ORDER — SODIUM CHLORIDE 0.9 % IV SOLN
Freq: Once | INTRAVENOUS | Status: AC
  Administered 2017-12-03: 17:00:00 via INTRAVENOUS

## 2017-12-03 MED ORDER — MORPHINE SULFATE (PF) 2 MG/ML IV SOLN
2.0000 mg | Freq: Once | INTRAVENOUS | Status: AC
  Administered 2017-12-03: 2 mg via INTRAVENOUS
  Filled 2017-12-03: qty 1

## 2017-12-03 MED ORDER — PANTOPRAZOLE SODIUM 40 MG PO TBEC: 40.0000 mg | DELAYED_RELEASE_TABLET | Freq: Two times a day (BID) | ORAL | 3 refills | Status: DC

## 2017-12-03 MED ORDER — MORPHINE SULFATE (PF) 4 MG/ML IV SOLN
4.0000 mg | Freq: Once | INTRAVENOUS | Status: AC
  Administered 2017-12-03: 4 mg via INTRAMUSCULAR
  Filled 2017-12-03: qty 1

## 2017-12-03 NOTE — ED Provider Notes (Signed)
[facility] EMERGENCY DEPARTMENT Provider Note   CSN: [id number] Arrival date & time: 12/03/17  1315     History   Chief Complaint Chief Complaint  Patient presents with  . Hypotension    HPI Justin Parsons is a 77 y.o. male.  Level 5 caveat for mild dementia.  Patient was at his gastroenterologist today when his blood pressure was noted to be low.  Uncertain of reading at office.  No chest pain, dyspnea, fever, sweats, chills.  He has lost 100 pounds over the past year.  Review of systems positive for pain in his right upper back.  He is in the emergency department with his caregiver.      Past Medical History:  Diagnosis Date  . Adenomatous polyp of colon   . Alcoholism ([place])    Moonshine  . Arthritis   . Atrial fibrillation ([place])   . Blindness of right eye 1997  . CAD (coronary artery disease)    Multivessel disease at cardiac catheterization June 2018, DES to LAD June 2018 with significant residual disease including diagonal and obtuse marginal vessels as well as RCA  . Cirrhosis of liver (HCC)    Associated portal gastropathy  . CKD (chronic kidney disease) stage 3, GFR 30-59 ml/min (HCC)   . Essential hypertension   . Hiatal hernia   . Hypothyroidism   . 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 TIA (transient ischemic attack)     Patient Active Problem List   Diagnosis Date Noted  . Gastritis due to nonsteroidal anti-inflammatory drug   . 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 Intractable vomiting 09/20/2017  . Atrial fibrillation with RVR ([place]) 09/20/2017  . Atrial fibrillation, chronic ([place]) 08/18/2017  . Gastric outlet obstruction   . AKI (acute kidney injury) ([place])   . Nausea with vomiting 08/15/2017  . Hypotension 08/15/2017  . Dehydration 08/15/2017  . Intractable vomiting with nausea 08/15/2017  . Transaminasemia 08/15/2017  . AF (paroxysmal atrial fibrillation) ([place]) 08/15/2017  . 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 Abnormal liver enzymes   . Coronary artery disease involving native coronary artery of native heart with angina pectoris ([place])   . Tobacco abuse   . Acute renal failure superimposed on stage 3 chronic kidney disease ([place])   . Alcohol use disorder   . Non-ST elevation (NSTEMI) myocardial infarction ([place]) 04/29/2017  . Delirium tremens ([place])   . Shock circulatory ([place])   . Confusion   . Acute encephalopathy 04/27/2017  . Schatzki's ring   . Esophageal varices ([place])   . History of esophageal stricture 01/20/2015  . Esophageal stricture   . History of colonic polyps   . Dysphagia, pharyngoesophageal phase 11/04/2014  . Suicidal ideation 11/17/2013  . Acute renal failure ([place]) 11/17/2013  . Acute diastolic CHF (congestive heart failure) ([place]) 11/13/2013  . Acute respiratory failure ([place]) 11/13/2013  . Community acquired pneumonia 11/12/2013  . A-fib ([place]) 11/12/2013  . CKD (chronic kidney disease), stage III ([place]) 11/12/2013  . Unspecified hypothyroidism 11/12/2013  . Dyspnea 11/12/2013  . Thrombocytopenia ([place]) 06/05/2012  . ARF (acute renal failure) ([place]) 06/04/2012  . Obesity 06/04/2012  . Cirrhosis ([place]) 01/29/2012  . Adenomatous polyp 01/29/2012  . Renal insufficiency 05/01/2011  . Anemia 05/01/2011  . Rectal bleed 05/01/2011  . GERD (gastroesophageal reflux disease) 05/01/2011    Past Surgical History:  Procedure Laterality Date  . CARDIAC CATHETERIZATION  2003  . COLONOSCOPY  08/2008   normal, repeat exam  in 5-7 years  . COLONOSCOPY  2004   rectal adenomatous polyp  . COLONOSCOPY N/A 12/01/2014   YIR:SWNIOE rectum/elongated colon  . CORONARY ATHERECTOMY N/A 05/03/2017   Procedure: Coronary Atherectomy;  Surgeon: Leonie Man, MD;  Location: [place] [facility];  Service: Cardiovascular;  Laterality: N/A;  . CORONARY STENT INTERVENTION N/A 05/03/2017   Procedure: Coronary Stent Intervention;  Surgeon: Leonie Man, MD;  Location: [place] [facility];   Service: Cardiovascular;  Laterality: N/A;  . ESOPHAGEAL DILATION N/A 02/07/2015   Procedure: ESOPHAGEAL DILATION;  Surgeon: Daneil Dolin, MD;  [facility];  Service: Endoscopy;  Laterality: N/A;  . ESOPHAGEAL DILATION N/A 08/17/2017   Procedure: ESOPHAGEAL DILATION;  Surgeon: Danie Binder, MD;  [facility];  Service: Endoscopy;  Laterality: N/A;  . ESOPHAGEAL DILATION N/A 09/23/2017   Procedure: ESOPHAGEAL DILATION;  Surgeon: Danie Binder, MD;  [facility];  Service: Endoscopy;  Laterality: N/A;  . ESOPHAGOGASTRODUODENOSCOPY  02/11/2012   Dr. Gala Romney: portal gastropathy, gastric erosions, esophageal ulcerations likely pill-induced, surveillance in 2 years  . ESOPHAGOGASTRODUODENOSCOPY N/A 12/01/2014   Dr. Volney American esophageal stricture dilated with the scope passage, portal gastropathy, negative H.pylori on gastric biopsies, esophageal biopsies benign  . ESOPHAGOGASTRODUODENOSCOPY N/A 02/07/2015   Procedure: ESOPHAGOGASTRODUODENOSCOPY (EGD);  Surgeon: Daneil Dolin, MD;  [facility];  Service: Endoscopy;  Laterality: N/A;  1115  . ESOPHAGOGASTRODUODENOSCOPY N/A 08/17/2017   benign-appearing esophageal stenosis s/p dilation, mild gastritis, pylorus stenosis s/p dilation  . ESOPHAGOGASTRODUODENOSCOPY N/A 09/23/2017   moderate benign-appearing instrinsic stenosis s/p dilation, mild gastritis  . ESOPHAGOGASTRODUODENOSCOPY N/A 10/18/2017   Benign-appearing esophageal stricture s/p dilation, gastritis, benign-appearing intrinsice moderate pylorus stenosis s/p dilation  . EYE SURGERY     RIGHT EYE REMOVED  . JOINT REPLACEMENT    . LEFT HEART CATH AND CORONARY ANGIOGRAPHY N/A 05/01/2017   Procedure: Left Heart Cath and Coronary Angiography;  Surgeon: Leonie Man, MD;  Location: [place] [facility];  Service: Cardiovascular;  Laterality: N/A;  . Venia Minks DILATION N/A 10/18/2017   Procedure: Venia Minks DILATION;  Surgeon: Danie Binder, MD;   [facility];  Service: Endoscopy;  Laterality: N/A;  . s/p eye implant  1997   artificial eye, right  . SAVORY DILATION N/A 10/18/2017   Procedure: SAVORY DILATION;  Surgeon: Danie Binder, MD;  [facility];  Service: Endoscopy;  Laterality: N/A;  . TOTAL HIP ARTHROPLASTY  2002  . TOTAL HIP ARTHROPLASTY  2006/2012   revision in 2012  . TOTAL KNEE ARTHROPLASTY  1999/2003   left/right       Home Medications    Prior to Admission medications   Medication Sig Start Date End Date Taking? Authorizing Provider  allopurinol (ZYLOPRIM) 100 MG tablet Take 1 tablet (100 mg total) by mouth daily. Patient taking differently: Take 300 mg by mouth daily.  08/19/17  Yes TatShanon Brow, MD  aspirin 81 MG chewable tablet Chew 81 mg by mouth daily.   Yes [provider]  atorvastatin (LIPITOR) 80 MG tablet Take 80 mg by mouth daily at 6 PM.  09/12/17  Yes [provider]  calcium carbonate (TUMS - DOSED IN MG ELEMENTAL CALCIUM) 500 MG chewable tablet Chew 1 tablet by mouth 2 (two) times daily.   Yes [provider]  clopidogrel (PLAVIX) 75 MG tablet Take 1 tablet (75 mg total) by mouth daily. 05/07/17  Yes Barton Dubois, MD  Ergocalciferol (VITAMIN D2)  400 units TABS Take 1 tablet by mouth 2 (two) times daily.   Yes [provider]  feeding supplement (BOOST / RESOURCE BREEZE) LIQD Take 1 Container by mouth 4 (four) times daily. Patient taking differently: Take 1 Container by mouth daily as needed (does not drink all the time).  08/18/17  Yes Tat, Shanon Brow, MD  folic acid (FOLVITE) 1 MG tablet Take 1 tablet (1 mg total) by mouth daily. 05/07/17  Yes Barton Dubois, MD  furosemide (LASIX) 40 MG tablet Take 40 mg by mouth daily. 11/14/17  Yes [provider]  levothyroxine (SYNTHROID, LEVOTHROID) 50 MCG tablet Take 50 mcg by mouth daily before breakfast.  09/12/17  Yes [provider]  metoCLOPramide (REGLAN) 5 MG tablet 1 po 30 minutes  prior to meals bid Patient taking differently: Take 5 mg by mouth 2 (two) times daily before a meal. 1 po 30 minutes prior to meals bid 10/18/17  Yes Fields, Sandi L, MD  metoprolol tartrate (LOPRESSOR) 25 MG tablet Take 0.5 tablets (12.5 mg total) by mouth 2 (two) times daily. 08/18/17  Yes Tat, Shanon Brow, MD  NON FORMULARY Apply 1 application topically daily as needed (for pain). [place] Gel as needed for arthritis pain    Yes [provider]  omeprazole (PRILOSEC) 40 MG capsule Take 1 capsule (40 mg total) by mouth 2 (two) times daily. 03/12/16  Yes Carlis Stable, NP  oxazepam (SERAX) 10 MG capsule Take 10 mg by mouth at bedtime as needed for sleep or anxiety.  09/13/17  Yes [provider]  polyethylene glycol (MIRALAX / GLYCOLAX) packet Take 17 g daily by mouth. 09/25/17  Yes Kathie Dike, MD  traMADol (ULTRAM) 50 MG tablet Take 1 tablet (50 mg total) by mouth every 6 (six) hours as needed. 08/18/17  Yes Tat, Shanon Brow, MD  vitamin C (ASCORBIC ACID) 500 MG tablet Take 500 mg by mouth daily.   Yes [provider]  pantoprazole (PROTONIX) 40 MG tablet Take 1 tablet (40 mg total) by mouth 2 (two) times daily before a meal. 30 minutes before meal 12/03/17   Annitta Needs, NP    Family History Family History  Problem Relation Age of Onset  . Ovarian cancer Sister   . Colon cancer Neg Hx     Social History Social History   Tobacco Use  . Smoking status: Never Smoker  . Smokeless tobacco: Never Used  . Tobacco comment: used to chew tobacco, none in 15 years  Substance Use Topics  . Alcohol use: No    Alcohol/week: 0.0 oz    Frequency: Never    Comment: no alcohol at present-in nursing home; half a gallon moonshine per week  . Drug use: No     Allergies   Patient has no known allergies.   Review of Systems Review of Systems  Unable to perform ROS: Dementia     Physical Exam Updated Vital Signs BP 102/69   Pulse 91   Temp 97.7 F (36.5 C) (Oral)   Resp 18    Ht 5' 8"  (1.727 m)   Wt 104.3 kg (230 lb)   SpO2 94%   BMI 34.97 kg/m   Physical Exam  Constitutional: He is oriented to person, place, and time.  No acute distress; slightly hypotensive  HENT:  Head: Normocephalic and atraumatic.  Eyes: Conjunctivae are normal.  Neck:  No meningeal signs; he is tender on the right medial upper back at approximately T1-2 level  Cardiovascular: Normal rate and  regular rhythm.  Pulmonary/Chest: Effort normal and breath sounds normal.  Abdominal: Soft. Bowel sounds are normal.  Musculoskeletal: Normal range of motion.  Neurological: He is alert and oriented to person, place, and time.  Skin: Skin is warm and dry.  Psychiatric: He has a normal mood and affect. His behavior is normal.  Nursing note and vitals reviewed.    ED Treatments / Results  Labs (all labs ordered are listed, but only abnormal results are displayed) Labs Reviewed  COMPREHENSIVE METABOLIC PANEL - Abnormal; Notable for the following components:      Result Value   Sodium 134 (*)    Chloride 96 (*)    Glucose, Bld 119 (*)    BUN 22 (*)    Creatinine, Ser 1.53 (*)    ALT 12 (*)    GFR calc non Af Amer 42 (*)    GFR calc Af Amer 49 (*)    All other components within normal limits  CBC WITH DIFFERENTIAL/PLATELET - Abnormal; Notable for the following components:   WBC 12.5 (*)    RDW 18.7 (*)    Platelets 147 (*)    Neutro Abs 9.4 (*)    Monocytes Absolute 1.2 (*)    All other components within normal limits  TROPONIN I    EKG  EKG Interpretation  Date/Time:  Tuesday December 03 2017 17:04:44 EST Ventricular Rate:  76 PR Interval:    QRS Duration: 120 QT Interval:  344 QTC Calculation: 387 R Axis:   -35 Text Interpretation:  Atrial fibrillation Incomplete left bundle branch block Confirmed by Nat Christen [phone]) on 12/03/2017 6:10:10 PM       Radiology No results found.  Procedures Procedures (including critical care time)  Medications Ordered in  ED Medications  0.9 %  sodium chloride infusion ( Intravenous Stopped 12/03/17 1905)  morphine 2 MG/ML injection 2 mg (2 mg Intravenous Given 12/03/17 1726)  morphine 4 MG/ML injection 4 mg (4 mg Intramuscular Given 12/03/17 1944)     Initial Impression / Assessment and Plan / ED Course  I have reviewed the triage vital signs and the nursing notes.  Pertinent labs & imaging results that were available during my care of the patient were reviewed by me and considered in my medical decision making (see chart for details).     Patient is in no acute distress.  No meningeal signs noted.  I have advised him to stop his blood pressure medications for the time being.  I suspect his significant weight loss has resulted in his blood pressure normalizing.  Tylenol for pain.  Discussed with the patient and his caregiver.  Final Clinical Impressions(s) / ED Diagnoses   Final diagnoses:  Hypotension, unspecified hypotension type  Upper back pain    ED Discharge Orders    None       Nat Christen, MD 12/03/17 2011

## 2017-12-03 NOTE — Discharge Instructions (Signed)
Tests showed no life-threatening condition.  I would hold your blood pressure medication for now.  You could take Tylenol 2 or 3 times a day for back pain.  Follow-up your primary care doctor.

## 2017-12-03 NOTE — ED Notes (Signed)
Attempted IV access x 3. Will get another RN to attempt.

## 2017-12-03 NOTE — Assessment & Plan Note (Signed)
77 year old male with history of multiple dilations over the past few months with known esophageal stricture and pyloric stenosis, most recently Oct 18, 2017. Presenting today only tolerating liquids and pudding-like textures, noting intermittent nausea and vomiting. After extensive discussion with him, it appears he is able to tolerate the soft textures and liquids but could probably benefit from repeat dilation. However, he is hypotensive and has not had any BP medications today. Upon further discussion, he notes he is not urinating often. I feel he is not adequately hydrating at home, and he has been hospitalized before with acute on chronic renal failure. Hypotension, weakness is concerning. He also endorses a 3 day history of posterior neck pain but denies fever or chills.   I asked him to present to the ED and notified the charge nurse due to clinical concerns for dehydration. Unclear etiology of neck pain at this time. He may just need fluid resuscitation and be able to continue with EGD/dilation as outpatient. However, if he is admitted, we can follow his clinical course and pursue upper GI evaluation as appropriate.   I have changed his Prilosec to Protonix, as he is on Plavix and I want to avoid any potential decreased efficacy of Plavix with concomitant omeprazole use.   He will need EGD/dilation in the future initially, but it may be helpful to obtain a BPE for further evaluation after EGD. Presenting to ED now. We will follow closely.

## 2017-12-03 NOTE — ED Triage Notes (Addendum)
Pt states saw GI doctor for now appetite for 3 months.  Hypotensive in GI office. 96/71 in triage.   C/o of neck pain as well.

## 2017-12-03 NOTE — Progress Notes (Signed)
Referring Provider: Monico Blitz, MD Primary Care Physician:  Monico Blitz, MD Primary GI: Dr. Gala Romney   Chief Complaint  Patient presents with  . Emesis  . Abdominal Pain    HPI:   Justin Parsons is a 77 y.o. 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 He has had multiple EGDs in the past due to dysphagia with findings of esophageal stricture and pyloric stenosis, s/p dilation. Most recent was Oct 18, 2017.   He presents today due to persistent issues. He states he did well for 2 weeks following the last dilation, then began intermittently vomiting again. He states he can tolerate liquids for the most part and pudding-like textures. He states he has eaten some hamburger and lettuce but has to eat very slowly. Vomiting has been better for the past 2 weeks but vomited again today. Ate pudding yesterday. He notes the calcium pill is hanging in his esophagus despite cutting this in half. Feels frustrated because he is unable to advance his diet from extremely soft foods and only able to eat small amounts. He never knows when he will vomit. No abdominal pain. He complains of posterior neck pain, radiating across the back of his head for the past 3 days. No fever or chills. His caretaker, Wille Glaser, is with him today.   His BP is 91/66 on presentation to office, with recheck manually 90/72. He feels fatigued and weak. Unable to stand on scale. In wheelchair. States he has one foot on a banana peel and one foot in the grave, getting ready to slide out of here. He has not taken his BP medication today. He states he is urinating but "only small amounts". He is on Prilosec BID. On Plavix. He continues to lose weight, down 15 lbs since September. High of 280 in 2016. Unable to weigh today, but last visit was 238.  He had been referred to Rheumatology due to salivary gland dysfunction.     Past Medical History:  Diagnosis Date  . Adenomatous polyp of colon   .  Alcoholism ([place])    [place]  . Arthritis   . Atrial fibrillation ([place])   . Blindness of right eye 1997  . CAD (coronary artery disease)    Multivessel disease at cardiac catheterization June 2018, DES to LAD June 2018 with significant residual disease including diagonal and obtuse marginal vessels as well as RCA  . Cirrhosis of liver (HCC)    Associated portal gastropathy  . CKD (chronic kidney disease) stage 3, GFR 30-59 ml/min (HCC)   . Essential hypertension   . Hiatal hernia   . Hypothyroidism   . 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 TIA (transient ischemic attack)     Past Surgical History:  Procedure Laterality Date  . CARDIAC CATHETERIZATION  2003  . COLONOSCOPY  08/2008   normal, repeat exam in 5-7 years  . COLONOSCOPY  2004   rectal adenomatous polyp  . COLONOSCOPY N/A 12/01/2014   [id number] rectum/elongated colon  . CORONARY ATHERECTOMY N/A 05/03/2017   Procedure: Coronary Atherectomy;  Surgeon: Leonie Man, MD;  Location: [facility];  Service: Cardiovascular;  Laterality: N/A;  . CORONARY STENT INTERVENTION N/A 05/03/2017   Procedure: Coronary Stent Intervention;  Surgeon: Leonie Man, MD;  Location: [facility];  Service: Cardiovascular;  Laterality: N/A;  . ESOPHAGEAL DILATION N/A 02/07/2015   Procedure: ESOPHAGEAL DILATION;  Surgeon: Daneil Dolin, MD;  [facility];  Service: Endoscopy;  Laterality: N/A;  . ESOPHAGEAL DILATION N/A 08/17/2017   Procedure: ESOPHAGEAL DILATION;  Surgeon: Danie Binder, MD;  [facility];  Service: Endoscopy;  Laterality: N/A;  . ESOPHAGEAL DILATION N/A 09/23/2017   Procedure: ESOPHAGEAL DILATION;  Surgeon: Danie Binder, MD;  [facility];  Service: Endoscopy;  Laterality: N/A;  . ESOPHAGOGASTRODUODENOSCOPY  02/11/2012   Dr. Gala Romney: portal gastropathy, gastric erosions, esophageal ulcerations likely pill-induced, surveillance in 2 years  . ESOPHAGOGASTRODUODENOSCOPY N/A 12/01/2014    Dr. Volney American esophageal stricture dilated with the scope passage, portal gastropathy, negative H.pylori on gastric biopsies, esophageal biopsies benign  . ESOPHAGOGASTRODUODENOSCOPY N/A 02/07/2015   Procedure: ESOPHAGOGASTRODUODENOSCOPY (EGD);  Surgeon: Daneil Dolin, MD;  [facility];  Service: Endoscopy;  Laterality: N/A;  1115  . ESOPHAGOGASTRODUODENOSCOPY N/A 08/17/2017   benign-appearing esophageal stenosis s/p dilation, mild gastritis, pylorus stenosis s/p dilation  . ESOPHAGOGASTRODUODENOSCOPY N/A 09/23/2017   moderate benign-appearing instrinsic stenosis s/p dilation, mild gastritis  . ESOPHAGOGASTRODUODENOSCOPY N/A 10/18/2017   Benign-appearing esophageal stricture s/p dilation, gastritis, benign-appearing intrinsice moderate pylorus stenosis s/p dilation  . EYE SURGERY     RIGHT EYE REMOVED  . JOINT REPLACEMENT    . LEFT HEART CATH AND CORONARY ANGIOGRAPHY N/A 05/01/2017   Procedure: Left Heart Cath and Coronary Angiography;  Surgeon: Leonie Man, MD;  Location: [facility];  Service: Cardiovascular;  Laterality: N/A;  . Venia Minks DILATION N/A 10/18/2017   Procedure: Venia Minks DILATION;  Surgeon: Danie Binder, MD;  [facility];  Service: Endoscopy;  Laterality: N/A;  . s/p eye implant  1997   artificial eye, right  . SAVORY DILATION N/A 10/18/2017   Procedure: SAVORY DILATION;  Surgeon: Danie Binder, MD;  [facility];  Service: Endoscopy;  Laterality: N/A;  . TOTAL HIP ARTHROPLASTY  2002  . TOTAL HIP ARTHROPLASTY  2006/2012   revision in 2012  . TOTAL KNEE ARTHROPLASTY  1999/2003   left/right    Current Outpatient Medications  Medication Sig Dispense Refill  . allopurinol (ZYLOPRIM) 100 MG tablet Take 1 tablet (100 mg total) by mouth daily. (Patient taking differently: Take 300 mg by mouth daily. ) 30 tablet 1  . aspirin 81 MG chewable tablet Chew 81 mg by mouth daily.    Marland Kitchen atorvastatin (LIPITOR) 80 MG tablet Take 80 mg by  mouth daily at 6 PM.     . calcium carbonate (TUMS - DOSED IN MG ELEMENTAL CALCIUM) 500 MG chewable tablet Chew 1 tablet by mouth 2 (two) times daily.    . clopidogrel (PLAVIX) 75 MG tablet Take 1 tablet (75 mg total) by mouth daily.    . Ergocalciferol (VITAMIN D2) 400 units TABS Take 1 tablet by mouth 2 (two) times daily.    . feeding supplement (BOOST / RESOURCE BREEZE) LIQD Take 1 Container by mouth 4 (four) times daily. 120 Container 0  . folic acid (FOLVITE) 1 MG tablet Take 1 tablet (1 mg total) by mouth daily.    Marland Kitchen levothyroxine (SYNTHROID, LEVOTHROID) 50 MCG tablet Take 50 mcg by mouth daily before breakfast.     . metoCLOPramide (REGLAN) 5 MG tablet 1 po 30 minutes prior to meals bid 60 tablet 11  . metoprolol tartrate (LOPRESSOR) 25 MG tablet Take 0.5 tablets (12.5 mg total) by mouth 2 (two) times daily. 60 tablet 0  . NON FORMULARY [place] Gel as needed for arthritis pain    . omeprazole (PRILOSEC) 40  MG capsule Take 1 capsule (40 mg total) by mouth 2 (two) times daily. 60 capsule 5  . oxazepam (SERAX) 10 MG capsule Take 10 mg by mouth at bedtime as needed for sleep or anxiety.     . polyethylene glycol (MIRALAX / GLYCOLAX) packet Take 17 g daily by mouth. 14 each 0  . traMADol (ULTRAM) 50 MG tablet Take 1 tablet (50 mg total) by mouth every 6 (six) hours as needed. 6 tablet 0  . vitamin C (ASCORBIC ACID) 500 MG tablet Take 500 mg by mouth daily.    . pantoprazole (PROTONIX) 40 MG tablet Take 1 tablet (40 mg total) by mouth 2 (two) times daily before a meal. 30 minutes before meal 60 tablet 3   No current facility-administered medications for this visit.     Allergies as of 12/03/2017  . (No Known Allergies)    Family History  Problem Relation Age of Onset  . Ovarian cancer Sister   . Colon cancer Neg Hx     Social History   Socioeconomic History  . Marital status: Divorced    Spouse name: None  . Number of children: None  . Years of education: None  . Highest education  level: None  Social Needs  . Financial resource strain: None  . Food insecurity - worry: None  . Food insecurity - inability: None  . Transportation needs - medical: None  . Transportation needs - non-medical: None  Occupational History  . None  Tobacco Use  . Smoking status: Never Smoker  . Smokeless tobacco: Never Used  . Tobacco comment: used to chew tobacco, none in 15 years  Substance and Sexual Activity  . Alcohol use: No    Alcohol/week: 0.0 oz    Frequency: Never    Comment: no alcohol at present-in nursing home; half a gallon moonshine per week  . Drug use: No  . Sexual activity: Not Currently  Other Topics Concern  . None  Social History Narrative   One son deceased while in prison, drug addiction.    Review of Systems: As mentioned in HPI   Physical Exam: BP 90/72 (BP Location: Left Arm)   Pulse (!) 58   Temp (!) 96.7 F (35.9 C) (Oral)   Ht 5' 8"  (1.727 m)   BMI 36.30 kg/m  General:   Alert and oriented. Appears chronically ill. Unable to stand.  Head:  Normocephalic and atraumatic. Eyes:  Conjuctiva clear without scleral icterus. Mouth:  Oral mucosa pink and dry Abdomen:  +BS, soft, non-tender and non-distended. No rebound or guarding. Limited exam with patient in chair Msk:  Symmetrical without gross deformities. Normal posture. Extremities:  Without edema. Neurologic:  Alert and  oriented x4 Psych:  Alert and cooperative. Normal mood and affect.

## 2017-12-03 NOTE — Patient Instructions (Addendum)
We are setting you up for an upper endoscopy with dilation in the near future.  STOP PRILOSEC (omeprazole). This can cause decreased effectiveness of Plavix. Start Protonix twice a day, 30 minutes before breakfast and dinner. I sent this to [facility] in [place].   Please call your doctor about the calcium.   Please follow the pureed diet. Drink plenty of liquids. Call us if you have repeated vomiting and unable to keep down liquids.   We are referring you to Rheumatology because of the dry mouth.   ADDENDUM: we repeated your blood pressure, and it is still low. Because you have not taken your blood pressure medications today and you feel weak, I feel that you may be dehydrated. Please go to the emergency room where they can draw labs, give you fluids if needed, and check to make sure nothing else is going on.

## 2017-12-03 NOTE — Telephone Encounter (Signed)
Per pt, he is unable to go to several appointments as he has hard time getting in/out of the car and due to transportation. He asked if he could do pre-op same day as EGD or if pre-op can be done over the phone.  Spoke with Hoyle Sauer and she reports she can do pre-op phone call on 12/17/16. Called pt, NA. WCB

## 2017-12-04 ENCOUNTER — Telehealth: Payer: Self-pay

## 2017-12-04 NOTE — Telephone Encounter (Signed)
Pt called back upset, stating that he spoke with someone from our office that told him they would call 911 for him if he wasn't feeling well.  In previous note, MS spoke with pt about his appointment.   I informed pt that our office called pt to discuss his appointment only. Pt was asked to call 911 himself, if he was feeling worse. Pt continued to yell at me saying "I'm not going to the ED if yall don't call for me".   I then told pt if your not feeling well, please hang up and call 911.  Pt then said "Well I guess you just want me to lay here and die". Pt then hung up on me.  AB is aware of the conversation.

## 2017-12-04 NOTE — Telephone Encounter (Signed)
Agree.   We need to have him see his PCP. He is continuing to have neck pain. Need to have adjustment of BP medications. Likely hypotension secondary to continued BP medications while losing weight. Patient said he would make an appt.   I need to see him back end of January before the procedure to update H&P.

## 2017-12-04 NOTE — Telephone Encounter (Addendum)
Spoke with pt and is aware preop will be done via phone call on 12/17/17. Patient stated he was feeling worse from yesterday and that he felt he needed to call 911. I advised pt if he felt this bad then he should do so. He stated okay and nothing further needed

## 2017-12-05 NOTE — Telephone Encounter (Signed)
Please have patient call us when he gets the appt with PCP. It won't be helpful for me to see him until he has seen PCP and had any medications adjusted.   Here's the plan: 1. IF he can be seen Monday or Tuesday with PCP, I can see him Wednesday at 3pm (would need to add a slot). 2. IF he CAN NOT be seen early next week, then we can put him on for the 25th at 73 with me.   Let me know if this doesn't work.

## 2017-12-05 NOTE — Telephone Encounter (Signed)
Called pt and he did not answer, VM not set up yet.

## 2017-12-05 NOTE — Telephone Encounter (Signed)
Called spoke with pt and he stated he has not scheduled an appointment with PCP yet but is going to call next week to get an appointment with them.

## 2017-12-05 NOTE — Progress Notes (Signed)
CC'D TO PCP °

## 2017-12-06 NOTE — Telephone Encounter (Signed)
Called pt, no answer and no VM. Will call back

## 2017-12-06 NOTE — Telephone Encounter (Signed)
Called spoke with pt. He reports she will see if he can be seen next week by PCP. He wants the appt with AB on 12/13/17 at 11:00pm. FYI to [place].

## 2017-12-10 DIAGNOSIS — K7030 Alcoholic cirrhosis of liver without ascites: Secondary | ICD-10-CM | POA: Diagnosis not present

## 2017-12-10 DIAGNOSIS — Z299 Encounter for prophylactic measures, unspecified: Secondary | ICD-10-CM | POA: Diagnosis not present

## 2017-12-10 DIAGNOSIS — I509 Heart failure, unspecified: Secondary | ICD-10-CM | POA: Diagnosis not present

## 2017-12-10 DIAGNOSIS — Z6841 Body Mass Index (BMI) 40.0 and over, adult: Secondary | ICD-10-CM | POA: Diagnosis not present

## 2017-12-10 DIAGNOSIS — I10 Essential (primary) hypertension: Secondary | ICD-10-CM | POA: Diagnosis not present

## 2017-12-10 DIAGNOSIS — K228 Other specified diseases of esophagus: Secondary | ICD-10-CM | POA: Diagnosis not present

## 2017-12-10 DIAGNOSIS — I4891 Unspecified atrial fibrillation: Secondary | ICD-10-CM | POA: Diagnosis not present

## 2017-12-10 DIAGNOSIS — M62838 Other muscle spasm: Secondary | ICD-10-CM | POA: Diagnosis not present

## 2017-12-10 DIAGNOSIS — Z789 Other specified health status: Secondary | ICD-10-CM | POA: Diagnosis not present

## 2017-12-13 ENCOUNTER — Encounter: Payer: Self-pay | Admitting: Internal Medicine

## 2017-12-13 ENCOUNTER — Ambulatory Visit (INDEPENDENT_AMBULATORY_CARE_PROVIDER_SITE_OTHER): Payer: Medicare Other | Admitting: Gastroenterology

## 2017-12-13 ENCOUNTER — Encounter: Payer: Self-pay | Admitting: Gastroenterology

## 2017-12-13 VITALS — BP 115/78 | HR 58 | Temp 97.0°F | Ht 68.0 in

## 2017-12-13 DIAGNOSIS — K7030 Alcoholic cirrhosis of liver without ascites: Secondary | ICD-10-CM

## 2017-12-13 DIAGNOSIS — K222 Esophageal obstruction: Secondary | ICD-10-CM

## 2017-12-13 NOTE — Patient Instructions (Signed)
Please be near your phone on 1/29 at 8am, because they will be calling you to do the pre-admission questions.  You have already been scheduled for an upper endoscopy with dilation next week.  I will see you in March 2019!

## 2017-12-17 ENCOUNTER — Encounter (HOSPITAL_COMMUNITY)
Admission: RE | Admit: 2017-12-17 | Discharge: 2017-12-17 | Disposition: A | Discharge status: Home / Self Care | Payer: Medicare Other | Source: Ambulatory Visit | Attending: Internal Medicine | Admitting: Internal Medicine

## 2017-12-17 DIAGNOSIS — R2681 Unsteadiness on feet: Secondary | ICD-10-CM | POA: Diagnosis not present

## 2017-12-17 DIAGNOSIS — M79606 Pain in leg, unspecified: Secondary | ICD-10-CM | POA: Diagnosis not present

## 2017-12-17 DIAGNOSIS — M6281 Muscle weakness (generalized): Secondary | ICD-10-CM | POA: Diagnosis not present

## 2017-12-18 ENCOUNTER — Telehealth: Payer: Self-pay | Admitting: Gastroenterology

## 2017-12-18 NOTE — Telephone Encounter (Signed)
Spoke with pt. Pt is going to have his caregiver give a call or come by Monday since he isn't able to remember what was said.

## 2017-12-18 NOTE — Progress Notes (Signed)
Referring Provider: Monico Blitz, MD Primary Care Physician:  Monico Blitz, MD Primary GI: Dr. Gala Romney   Chief Complaint  Patient presents with  . Follow-up    saw PCP 2 days ago    HPI:   Justin Parsons is a 77 y.o. 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 Multiple prior EGDs due to dysphagia with findings of esophageal stricture, pyloric stenosis. Most recent in Oct 18, 2017 during hospitalization. I saw him in Jan 2019, but he was hypotensive and appeared clinically dehydrated. He was sent to the ED, and BP medications were adjusted. He returns today to arrange EGD/dilation as per plan in Jan 2019. Also noted delayed gastric emptying on GES in Sept 2018.   He notes intermittent nausea and vomiting, choking with liquids at times. Solid food dysphagia intermittently as well. Ate a large amount of potato soup recently and vomited several hours later.Denies lower GI symptoms. BP much improved today. I changed his medications from Prilosec to Hissop in Jan, but it appears he is still on Prilosec. This had been changed due to chronic need for plavix.   Past Medical History:  Diagnosis Date  . Adenomatous polyp of colon   . Alcoholism ([place])    [place]  . Arthritis   . Atrial fibrillation ([place])   . Blindness of right eye 1997  . CAD (coronary artery disease)    Multivessel disease at cardiac catheterization June 2018, DES to LAD June 2018 with significant residual disease including diagonal and obtuse marginal vessels as well as RCA  . Cirrhosis of liver (HCC)    Associated portal gastropathy  . CKD (chronic kidney disease) stage 3, GFR 30-59 ml/min (HCC)   . Essential hypertension   . Hiatal hernia   . Hypothyroidism   . 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 TIA (transient ischemic attack)     Past Surgical History:  Procedure Laterality Date  . CARDIAC CATHETERIZATION  2003  . COLONOSCOPY  08/2008   normal, repeat exam in 5-7 years  .  COLONOSCOPY  2004   rectal adenomatous polyp  . COLONOSCOPY N/A 12/01/2014   [id number] rectum/elongated colon  . CORONARY ATHERECTOMY N/A 05/03/2017   Procedure: Coronary Atherectomy;  Surgeon: Leonie Man, MD;  Location: [facility];  Service: Cardiovascular;  Laterality: N/A;  . CORONARY STENT INTERVENTION N/A 05/03/2017   Procedure: Coronary Stent Intervention;  Surgeon: Leonie Man, MD;  Location: [facility];  Service: Cardiovascular;  Laterality: N/A;  . ESOPHAGEAL DILATION N/A 02/07/2015   Procedure: ESOPHAGEAL DILATION;  Surgeon: Daneil Dolin, MD;  [facility];  Service: Endoscopy;  Laterality: N/A;  . ESOPHAGEAL DILATION N/A 08/17/2017   Procedure: ESOPHAGEAL DILATION;  Surgeon: Danie Binder, MD;  [facility];  Service: Endoscopy;  Laterality: N/A;  . ESOPHAGEAL DILATION N/A 09/23/2017   Procedure: ESOPHAGEAL DILATION;  Surgeon: Danie Binder, MD;  [facility];  Service: Endoscopy;  Laterality: N/A;  . ESOPHAGOGASTRODUODENOSCOPY  02/11/2012   Dr. Gala Romney: portal gastropathy, gastric erosions, esophageal ulcerations likely pill-induced, surveillance in 2 years  . ESOPHAGOGASTRODUODENOSCOPY N/A 12/01/2014   Dr. Volney American esophageal stricture dilated with the scope passage, portal gastropathy, negative H.pylori on gastric biopsies, esophageal biopsies benign  . ESOPHAGOGASTRODUODENOSCOPY N/A 02/07/2015   Procedure: ESOPHAGOGASTRODUODENOSCOPY (EGD);  Surgeon: Daneil Dolin, MD;  [facility];  Service: Endoscopy;  Laterality: N/A;  1115  . ESOPHAGOGASTRODUODENOSCOPY N/A 08/17/2017  benign-appearing esophageal stenosis s/p dilation, mild gastritis, pylorus stenosis s/p dilation  . ESOPHAGOGASTRODUODENOSCOPY N/A 09/23/2017   moderate benign-appearing instrinsic stenosis s/p dilation, mild gastritis  . ESOPHAGOGASTRODUODENOSCOPY N/A 10/18/2017   Benign-appearing esophageal stricture s/p dilation, gastritis,  benign-appearing intrinsice moderate pylorus stenosis s/p dilation  . EYE SURGERY     RIGHT EYE REMOVED  . JOINT REPLACEMENT    . LEFT HEART CATH AND CORONARY ANGIOGRAPHY N/A 05/01/2017   Procedure: Left Heart Cath and Coronary Angiography;  Surgeon: Leonie Man, MD;  Location: [facility];  Service: Cardiovascular;  Laterality: N/A;  . Venia Minks DILATION N/A 10/18/2017   Procedure: Venia Minks DILATION;  Surgeon: Danie Binder, MD;  [facility];  Service: Endoscopy;  Laterality: N/A;  . s/p eye implant  1997   artificial eye, right  . SAVORY DILATION N/A 10/18/2017   Procedure: SAVORY DILATION;  Surgeon: Danie Binder, MD;  [facility];  Service: Endoscopy;  Laterality: N/A;  . TOTAL HIP ARTHROPLASTY  2002  . TOTAL HIP ARTHROPLASTY  2006/2012   revision in 2012  . TOTAL KNEE ARTHROPLASTY  1999/2003   left/right    Current Outpatient Medications  Medication Sig Dispense Refill  . allopurinol (ZYLOPRIM) 100 MG tablet Take 1 tablet (100 mg total) by mouth daily. (Patient taking differently: Take 300 mg by mouth daily. ) 30 tablet 1  . aspirin 81 MG chewable tablet Chew 81 mg by mouth daily.    Marland Kitchen atorvastatin (LIPITOR) 80 MG tablet Take 80 mg by mouth daily at 6 PM.     . calcium carbonate (TUMS - DOSED IN MG ELEMENTAL CALCIUM) 500 MG chewable tablet Chew 1 tablet by mouth 2 (two) times daily.    . clopidogrel (PLAVIX) 75 MG tablet Take 1 tablet (75 mg total) by mouth daily.    . Ergocalciferol (VITAMIN D2) 400 units TABS Take 1 tablet by mouth 2 (two) times daily.    . feeding supplement (BOOST / RESOURCE BREEZE) LIQD Take 1 Container by mouth 4 (four) times daily. (Patient taking differently: Take 1 Container by mouth daily as needed (does not drink all the time). ) 120 Container 0  . folic acid (FOLVITE) 1 MG tablet Take 1 tablet (1 mg total) by mouth daily.    . furosemide (LASIX) 40 MG tablet Take 40 mg by mouth daily.    Marland Kitchen levothyroxine (SYNTHROID,  LEVOTHROID) 50 MCG tablet Take 50 mcg by mouth daily before breakfast.     . metoCLOPramide (REGLAN) 5 MG tablet 1 po 30 minutes prior to meals bid (Patient taking differently: Take 5 mg by mouth 2 (two) times daily before a meal. 1 po 30 minutes prior to meals bid) 60 tablet 11  . metoprolol tartrate (LOPRESSOR) 25 MG tablet Take 0.5 tablets (12.5 mg total) by mouth 2 (two) times daily. 60 tablet 0  . NON FORMULARY Apply 1 application topically daily as needed (for pain). [place] Gel as needed for arthritis pain     . omeprazole (PRILOSEC) 40 MG capsule Take 1 capsule (40 mg total) by mouth 2 (two) times daily. 60 capsule 5  . oxazepam (SERAX) 10 MG capsule Take 10 mg by mouth at bedtime as needed for sleep or anxiety.     . polyethylene glycol (MIRALAX / GLYCOLAX) packet Take 17 g daily by mouth. 14 each 0  . traMADol (ULTRAM) 50 MG tablet Take 1 tablet (50 mg total) by mouth every 6 (six) hours as needed. 6 tablet  0  . vitamin C (ASCORBIC ACID) 500 MG tablet Take 500 mg by mouth daily.     No current facility-administered medications for this visit.     Allergies as of 12/13/2017  . (No Known Allergies)    Family History  Problem Relation Age of Onset  . Ovarian cancer Sister   . Colon cancer Neg Hx     Social History   Socioeconomic History  . Marital status: Divorced    Spouse name: None  . Number of children: None  . Years of education: None  . Highest education level: None  Social Needs  . Financial resource strain: None  . Food insecurity - worry: None  . Food insecurity - inability: None  . Transportation needs - medical: None  . Transportation needs - non-medical: None  Occupational History  . None  Tobacco Use  . Smoking status: Never Smoker  . Smokeless tobacco: Never Used  . Tobacco comment: used to chew tobacco, none in 15 years  Substance and Sexual Activity  . Alcohol use: No    Alcohol/week: 0.0 oz    Frequency: Never    Comment: no alcohol at present-in  nursing home; half a gallon moonshine per week  . Drug use: No  . Sexual activity: Not Currently  Other Topics Concern  . None  Social History Narrative   One son deceased while in prison, drug addiction.    Review of Systems: Gen: see HPI  CV: Denies chest pain, palpitations, syncope, peripheral edema, and claudication. Resp: Denies dyspnea at rest, cough, wheezing, coughing up blood, and pleurisy. GI: see HPI  Derm: Denies rash, itching, dry skin Psych: Denies depression, anxiety, memory loss, confusion. No homicidal or suicidal ideation.  Heme: Denies bruising, bleeding, and enlarged lymph nodes.  Physical Exam: BP 115/78   Pulse (!) 58   Temp (!) 97 F (36.1 C) (Oral)   Ht 5' 8"  (1.727 m)   BMI 34.97 kg/m  General:   Alert and oriented. No distress noted. Pleasant and cooperative.  Head:  Normocephalic and atraumatic. Eyes:  Conjuctiva clear without scleral icterus. Mouth:  Oral mucosa pink and moist.  Lungs: clear to auscultation bilaterally Abdomen:  +BS, soft, non-tender and non-distended. No rebound or guarding. Limited exam with patient sitting in wheelchair Msk:  Symmetrical without gross deformities. Normal posture. Extremities:  Without edema. Neurologic:  Alert and  oriented x4 Psych:  Alert and cooperative. Normal mood and affect.

## 2017-12-18 NOTE — Telephone Encounter (Signed)
Tried calling pt, VM is full. Will call pt back.

## 2017-12-18 NOTE — H&P (View-Only) (Signed)
Referring Provider: Monico Blitz, MD Primary Care Physician:  Monico Blitz, MD Primary GI: Dr. Gala Romney   Chief Complaint  Patient presents with  . Follow-up    saw PCP 2 days ago    HPI:   Justin Parsons is a 77 y.o. 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 Multiple prior EGDs due to dysphagia with findings of esophageal stricture, pyloric stenosis. Most recent in Oct 18, 2017 during hospitalization. I saw him in Jan 2019, but he was hypotensive and appeared clinically dehydrated. He was sent to the ED, and BP medications were adjusted. He returns today to arrange EGD/dilation as per plan in Jan 2019. Also noted delayed gastric emptying on GES in Sept 2018.   He notes intermittent nausea and vomiting, choking with liquids at times. Solid food dysphagia intermittently as well. Ate a large amount of potato soup recently and vomited several hours later.Denies lower GI symptoms. BP much improved today. I changed his medications from Prilosec to [place] in Jan, but it appears he is still on Prilosec. This had been changed due to chronic need for plavix.   Past Medical History:  Diagnosis Date  . Adenomatous polyp of colon   . 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 Arthritis   . Atrial fibrillation ([place])   . Blindness of right eye 1997  . CAD (coronary artery disease)    Multivessel disease at cardiac catheterization June 2018, DES to LAD June 2018 with significant residual disease including diagonal and obtuse marginal vessels as well as RCA  . Cirrhosis of liver (HCC)    Associated portal gastropathy  . CKD (chronic kidney disease) stage 3, GFR 30-59 ml/min (HCC)   . Essential hypertension   . Hiatal hernia   . Hypothyroidism   . 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 TIA (transient ischemic attack)     Past Surgical History:  Procedure Laterality Date  . CARDIAC CATHETERIZATION  2003  . COLONOSCOPY  08/2008   normal, repeat exam in 5-7 years  .  COLONOSCOPY  2004   rectal adenomatous polyp  . COLONOSCOPY N/A 12/01/2014   [id number] rectum/elongated colon  . CORONARY ATHERECTOMY N/A 05/03/2017   Procedure: Coronary Atherectomy;  Surgeon: Leonie Man, MD;  Location: [facility];  Service: Cardiovascular;  Laterality: N/A;  . CORONARY STENT INTERVENTION N/A 05/03/2017   Procedure: Coronary Stent Intervention;  Surgeon: Leonie Man, MD;  Location: [facility];  Service: Cardiovascular;  Laterality: N/A;  . ESOPHAGEAL DILATION N/A 02/07/2015   Procedure: ESOPHAGEAL DILATION;  Surgeon: Daneil Dolin, MD;  [facility];  Service: Endoscopy;  Laterality: N/A;  . ESOPHAGEAL DILATION N/A 08/17/2017   Procedure: ESOPHAGEAL DILATION;  Surgeon: Danie Binder, MD;  [facility];  Service: Endoscopy;  Laterality: N/A;  . ESOPHAGEAL DILATION N/A 09/23/2017   Procedure: ESOPHAGEAL DILATION;  Surgeon: Danie Binder, MD;  [facility];  Service: Endoscopy;  Laterality: N/A;  . ESOPHAGOGASTRODUODENOSCOPY  02/11/2012   Dr. Gala Romney: portal gastropathy, gastric erosions, esophageal ulcerations likely pill-induced, surveillance in 2 years  . ESOPHAGOGASTRODUODENOSCOPY N/A 12/01/2014   Dr. Volney American esophageal stricture dilated with the scope passage, portal gastropathy, negative H.pylori on gastric biopsies, esophageal biopsies benign  . ESOPHAGOGASTRODUODENOSCOPY N/A 02/07/2015   Procedure: ESOPHAGOGASTRODUODENOSCOPY (EGD);  Surgeon: Daneil Dolin, MD;  [facility];  Service: Endoscopy;  Laterality: N/A;  1115  . ESOPHAGOGASTRODUODENOSCOPY N/A 08/17/2017  benign-appearing esophageal stenosis s/p dilation, mild gastritis, pylorus stenosis s/p dilation  . ESOPHAGOGASTRODUODENOSCOPY N/A 09/23/2017   moderate benign-appearing instrinsic stenosis s/p dilation, mild gastritis  . ESOPHAGOGASTRODUODENOSCOPY N/A 10/18/2017   Benign-appearing esophageal stricture s/p dilation, gastritis,  benign-appearing intrinsice moderate pylorus stenosis s/p dilation  . EYE SURGERY     RIGHT EYE REMOVED  . JOINT REPLACEMENT    . LEFT HEART CATH AND CORONARY ANGIOGRAPHY N/A 05/01/2017   Procedure: Left Heart Cath and Coronary Angiography;  Surgeon: Leonie Man, MD;  Location: [facility];  Service: Cardiovascular;  Laterality: N/A;  . Venia Minks DILATION N/A 10/18/2017   Procedure: Venia Minks DILATION;  Surgeon: Danie Binder, MD;  [facility];  Service: Endoscopy;  Laterality: N/A;  . s/p eye implant  1997   artificial eye, right  . SAVORY DILATION N/A 10/18/2017   Procedure: SAVORY DILATION;  Surgeon: Danie Binder, MD;  [facility];  Service: Endoscopy;  Laterality: N/A;  . TOTAL HIP ARTHROPLASTY  2002  . TOTAL HIP ARTHROPLASTY  2006/2012   revision in 2012  . TOTAL KNEE ARTHROPLASTY  1999/2003   left/right    Current Outpatient Medications  Medication Sig Dispense Refill  . allopurinol (ZYLOPRIM) 100 MG tablet Take 1 tablet (100 mg total) by mouth daily. (Patient taking differently: Take 300 mg by mouth daily. ) 30 tablet 1  . aspirin 81 MG chewable tablet Chew 81 mg by mouth daily.    Marland Kitchen atorvastatin (LIPITOR) 80 MG tablet Take 80 mg by mouth daily at 6 PM.     . calcium carbonate (TUMS - DOSED IN MG ELEMENTAL CALCIUM) 500 MG chewable tablet Chew 1 tablet by mouth 2 (two) times daily.    . clopidogrel (PLAVIX) 75 MG tablet Take 1 tablet (75 mg total) by mouth daily.    . Ergocalciferol (VITAMIN D2) 400 units TABS Take 1 tablet by mouth 2 (two) times daily.    . feeding supplement (BOOST / RESOURCE BREEZE) LIQD Take 1 Container by mouth 4 (four) times daily. (Patient taking differently: Take 1 Container by mouth daily as needed (does not drink all the time). ) 120 Container 0  . folic acid (FOLVITE) 1 MG tablet Take 1 tablet (1 mg total) by mouth daily.    . furosemide (LASIX) 40 MG tablet Take 40 mg by mouth daily.    Marland Kitchen levothyroxine (SYNTHROID,  LEVOTHROID) 50 MCG tablet Take 50 mcg by mouth daily before breakfast.     . metoCLOPramide (REGLAN) 5 MG tablet 1 po 30 minutes prior to meals bid (Patient taking differently: Take 5 mg by mouth 2 (two) times daily before a meal. 1 po 30 minutes prior to meals bid) 60 tablet 11  . metoprolol tartrate (LOPRESSOR) 25 MG tablet Take 0.5 tablets (12.5 mg total) by mouth 2 (two) times daily. 60 tablet 0  . NON FORMULARY Apply 1 application topically daily as needed (for pain). [place] Gel as needed for arthritis pain     . omeprazole (PRILOSEC) 40 MG capsule Take 1 capsule (40 mg total) by mouth 2 (two) times daily. 60 capsule 5  . oxazepam (SERAX) 10 MG capsule Take 10 mg by mouth at bedtime as needed for sleep or anxiety.     . polyethylene glycol (MIRALAX / GLYCOLAX) packet Take 17 g daily by mouth. 14 each 0  . traMADol (ULTRAM) 50 MG tablet Take 1 tablet (50 mg total) by mouth every 6 (six) hours as needed. 6 tablet  0  . vitamin C (ASCORBIC ACID) 500 MG tablet Take 500 mg by mouth daily.     No current facility-administered medications for this visit.     Allergies as of 12/13/2017  . (No Known Allergies)    Family History  Problem Relation Age of Onset  . Ovarian cancer Sister   . Colon cancer Neg Hx     Social History   Socioeconomic History  . Marital status: Divorced    Spouse name: None  . Number of children: None  . Years of education: None  . Highest education level: None  Social Needs  . Financial resource strain: None  . Food insecurity - worry: None  . Food insecurity - inability: None  . Transportation needs - medical: None  . Transportation needs - non-medical: None  Occupational History  . None  Tobacco Use  . Smoking status: Never Smoker  . Smokeless tobacco: Never Used  . Tobacco comment: used to chew tobacco, none in 15 years  Substance and Sexual Activity  . Alcohol use: No    Alcohol/week: 0.0 oz    Frequency: Never    Comment: no alcohol at present-in  nursing home; half a gallon moonshine per week  . Drug use: No  . Sexual activity: Not Currently  Other Topics Concern  . None  Social History Narrative   One son deceased while in prison, drug addiction.    Review of Systems: Gen: see HPI  CV: Denies chest pain, palpitations, syncope, peripheral edema, and claudication. Resp: Denies dyspnea at rest, cough, wheezing, coughing up blood, and pleurisy. GI: see HPI  Derm: Denies rash, itching, dry skin Psych: Denies depression, anxiety, memory loss, confusion. No homicidal or suicidal ideation.  Heme: Denies bruising, bleeding, and enlarged lymph nodes.  Physical Exam: BP 115/78   Pulse (!) 58   Temp (!) 97 F (36.1 C) (Oral)   Ht 5' 8"  (1.727 m)   BMI 34.97 kg/m  General:   Alert and oriented. No distress noted. Pleasant and cooperative.  Head:  Normocephalic and atraumatic. Eyes:  Conjuctiva clear without scleral icterus. Mouth:  Oral mucosa pink and moist.  Lungs: clear to auscultation bilaterally Abdomen:  +BS, soft, non-tender and non-distended. No rebound or guarding. Limited exam with patient sitting in wheelchair Msk:  Symmetrical without gross deformities. Normal posture. Extremities:  Without edema. Neurologic:  Alert and  oriented x4 Psych:  Alert and cooperative. Normal mood and affect.

## 2017-12-18 NOTE — Assessment & Plan Note (Signed)
77 year old male with history of multiple dilations over the past few months with known esophageal stricture and pyloric stenosis, most recently Oct 18, 2018 while inpatient. Recurrent solid food dysphagia and intermittent N/V reported. Likely multifactorial in this setting, and he also has a history of gastroparesis. We discussed pursuing dilation again as he has done well with this in the past. May need to pursue BPE thereafter. As of note, he has had a steady decline over the past few months with multiple other comorbidities, and we will follow him closely in the office.   Proceed with upper endoscopy/dilation in the near future with Dr. Gala Romney. The risks, benefits, and alternatives have been discussed in detail with patient. They have stated understanding and desire to proceed.  Propofol due to polypharmacy I have changed Prilosec to Protonix (as he is on Plavix and Prilosec with Plavix can decrease efficacy of Plavix) Consider BPE Return in March as already scheduled

## 2017-12-18 NOTE — Telephone Encounter (Signed)
I had changed Prilosec to Protonix in January, but I notice he is still on Prilosec per med list. As he is on Plavix, he really does not need to be taking Prilosec. Please let him know to stop Prilosec and start Protonix once daily, 30 minutes before breakfast. This was sent to the pharmacy already.

## 2017-12-18 NOTE — Assessment & Plan Note (Signed)
Felt secondary to NASH, +/- ETOH. Return in March 2019 for cirrhosis care.

## 2017-12-19 NOTE — Progress Notes (Signed)
cc'ed to pcp °

## 2017-12-20 DIAGNOSIS — K7030 Alcoholic cirrhosis of liver without ascites: Secondary | ICD-10-CM | POA: Diagnosis not present

## 2017-12-20 DIAGNOSIS — I959 Hypotension, unspecified: Secondary | ICD-10-CM | POA: Diagnosis not present

## 2017-12-20 DIAGNOSIS — N179 Acute kidney failure, unspecified: Secondary | ICD-10-CM | POA: Diagnosis not present

## 2017-12-20 DIAGNOSIS — D649 Anemia, unspecified: Secondary | ICD-10-CM | POA: Diagnosis not present

## 2017-12-23 ENCOUNTER — Encounter (HOSPITAL_COMMUNITY): Payer: Self-pay | Admitting: Anesthesiology

## 2017-12-23 ENCOUNTER — Encounter (HOSPITAL_COMMUNITY)
Admission: RE | Disposition: A | Discharge status: Home / Self Care | Payer: Self-pay | Source: Ambulatory Visit | Attending: Internal Medicine

## 2017-12-23 ENCOUNTER — Ambulatory Visit (HOSPITAL_COMMUNITY): Payer: Medicare Other | Admitting: Anesthesiology

## 2017-12-23 ENCOUNTER — Ambulatory Visit (HOSPITAL_COMMUNITY)
Admission: RE | Admit: 2017-12-23 | Discharge: 2017-12-23 | Disposition: A | Discharge status: Home / Self Care | Payer: Medicare Other | Source: Ambulatory Visit | Attending: Internal Medicine | Admitting: Internal Medicine

## 2017-12-23 DIAGNOSIS — I2510 Atherosclerotic heart disease of native coronary artery without angina pectoris: Secondary | ICD-10-CM | POA: Diagnosis not present

## 2017-12-23 DIAGNOSIS — Z96653 Presence of artificial knee joint, bilateral: Secondary | ICD-10-CM | POA: Insufficient documentation

## 2017-12-23 DIAGNOSIS — Z7982 Long term (current) use of aspirin: Secondary | ICD-10-CM | POA: Insufficient documentation

## 2017-12-23 DIAGNOSIS — K222 Esophageal obstruction: Secondary | ICD-10-CM | POA: Diagnosis not present

## 2017-12-23 DIAGNOSIS — H5461 Unqualified visual loss, right eye, normal vision left eye: Secondary | ICD-10-CM | POA: Diagnosis not present

## 2017-12-23 DIAGNOSIS — Z96649 Presence of unspecified artificial hip joint: Secondary | ICD-10-CM | POA: Diagnosis not present

## 2017-12-23 DIAGNOSIS — Z8673 Personal history of transient ischemic attack (TIA), and cerebral infarction without residual deficits: Secondary | ICD-10-CM | POA: Insufficient documentation

## 2017-12-23 DIAGNOSIS — R1310 Dysphagia, unspecified: Secondary | ICD-10-CM | POA: Diagnosis not present

## 2017-12-23 DIAGNOSIS — Z79899 Other long term (current) drug therapy: Secondary | ICD-10-CM | POA: Diagnosis not present

## 2017-12-23 DIAGNOSIS — K766 Portal hypertension: Secondary | ICD-10-CM | POA: Diagnosis not present

## 2017-12-23 DIAGNOSIS — M1990 Unspecified osteoarthritis, unspecified site: Secondary | ICD-10-CM | POA: Insufficient documentation

## 2017-12-23 DIAGNOSIS — F1020 Alcohol dependence, uncomplicated: Secondary | ICD-10-CM | POA: Diagnosis not present

## 2017-12-23 DIAGNOSIS — K219 Gastro-esophageal reflux disease without esophagitis: Secondary | ICD-10-CM | POA: Diagnosis not present

## 2017-12-23 DIAGNOSIS — R112 Nausea with vomiting, unspecified: Secondary | ICD-10-CM | POA: Diagnosis not present

## 2017-12-23 DIAGNOSIS — K7460 Unspecified cirrhosis of liver: Secondary | ICD-10-CM | POA: Insufficient documentation

## 2017-12-23 DIAGNOSIS — I129 Hypertensive chronic kidney disease with stage 1 through stage 4 chronic kidney disease, or unspecified chronic kidney disease: Secondary | ICD-10-CM | POA: Diagnosis not present

## 2017-12-23 DIAGNOSIS — E039 Hypothyroidism, unspecified: Secondary | ICD-10-CM | POA: Diagnosis not present

## 2017-12-23 DIAGNOSIS — Z955 Presence of coronary angioplasty implant and graft: Secondary | ICD-10-CM | POA: Diagnosis not present

## 2017-12-23 DIAGNOSIS — N183 Chronic kidney disease, stage 3 (moderate): Secondary | ICD-10-CM | POA: Insufficient documentation

## 2017-12-23 DIAGNOSIS — I8510 Secondary esophageal varices without bleeding: Secondary | ICD-10-CM | POA: Diagnosis not present

## 2017-12-23 DIAGNOSIS — I4891 Unspecified atrial fibrillation: Secondary | ICD-10-CM | POA: Diagnosis not present

## 2017-12-23 DIAGNOSIS — K228 Other specified diseases of esophagus: Secondary | ICD-10-CM

## 2017-12-23 DIAGNOSIS — K2210 Ulcer of esophagus without bleeding: Secondary | ICD-10-CM | POA: Insufficient documentation

## 2017-12-23 DIAGNOSIS — I252 Old myocardial infarction: Secondary | ICD-10-CM | POA: Insufficient documentation

## 2017-12-23 DIAGNOSIS — Z7902 Long term (current) use of antithrombotics/antiplatelets: Secondary | ICD-10-CM | POA: Diagnosis not present

## 2017-12-23 DIAGNOSIS — K3189 Other diseases of stomach and duodenum: Secondary | ICD-10-CM

## 2017-12-23 HISTORY — PX: BIOPSY: SHX5522

## 2017-12-23 HISTORY — PX: ESOPHAGOGASTRODUODENOSCOPY (EGD) WITH PROPOFOL: SHX5813

## 2017-12-23 SURGERY — ESOPHAGOGASTRODUODENOSCOPY (EGD) WITH PROPOFOL
Anesthesia: Monitor Anesthesia Care

## 2017-12-23 MED ORDER — LIDOCAINE VISCOUS 2 % MT SOLN
OROMUCOSAL | Status: AC
  Filled 2017-12-23: qty 15

## 2017-12-23 MED ORDER — MIDAZOLAM HCL 2 MG/2ML IJ SOLN
INTRAMUSCULAR | Status: AC
  Filled 2017-12-23: qty 2

## 2017-12-23 MED ORDER — PROPOFOL 10 MG/ML IV BOLUS
INTRAVENOUS | Status: AC
  Filled 2017-12-23: qty 40

## 2017-12-23 MED ORDER — FENTANYL CITRATE (PF) 100 MCG/2ML IJ SOLN
INTRAMUSCULAR | Status: AC
  Filled 2017-12-23: qty 2

## 2017-12-23 MED ORDER — LIDOCAINE VISCOUS 2 % MT SOLN
OROMUCOSAL | Status: DC | PRN
  Administered 2017-12-23: 6 mL via OROMUCOSAL

## 2017-12-23 MED ORDER — FENTANYL CITRATE (PF) 100 MCG/2ML IJ SOLN
25.0000 ug | Freq: Once | INTRAMUSCULAR | Status: AC
  Administered 2017-12-23: 25 ug via INTRAVENOUS

## 2017-12-23 MED ORDER — MIDAZOLAM HCL 2 MG/2ML IJ SOLN
1.0000 mg | INTRAMUSCULAR | Status: AC
  Administered 2017-12-23: 2 mg via INTRAVENOUS

## 2017-12-23 MED ORDER — LACTATED RINGERS IV SOLN
INTRAVENOUS | Status: DC
  Administered 2017-12-23: 10:00:00 via INTRAVENOUS

## 2017-12-23 MED ORDER — PROPOFOL 500 MG/50ML IV EMUL
INTRAVENOUS | Status: DC | PRN
  Administered 2017-12-23: 150 ug/kg/min via INTRAVENOUS

## 2017-12-23 NOTE — Interval H&P Note (Signed)
History and Physical Interval Note:  12/23/2017 9:28 AM  Justin Parsons  has presented today for surgery, with the diagnosis of esophageal stricture  The various methods of treatment have been discussed with the patient and family. After consideration of risks, benefits and other options for treatment, the patient has consented to  Procedure(s) with comments: ESOPHAGOGASTRODUODENOSCOPY (EGD) WITH PROPOFOL (N/A) - 10:45am MAL[place] DILATION (N/A) as a surgical intervention .  The patient's history has been reviewed, patient examined, no change in status, stable for surgery.  I have reviewed the patient's chart and labs.  Questions were answered to the patient's satisfaction.     Justin Parsons  No change. EGD with dilation as feasible/appropriate per plan.    The risks, benefits, limitations, alternatives and imponderables have been reviewed with the patient. Potential for esophageal dilation, biopsy, etc. have also been reviewed.  Questions have been answered. All parties agreeable.

## 2017-12-23 NOTE — Anesthesia Postprocedure Evaluation (Signed)
Anesthesia Post Note  Patient: Justin Parsons  Procedure(s) Performed: ESOPHAGOGASTRODUODENOSCOPY (EGD) WITH PROPOFOL (N/A ) BIOPSY  Patient location during evaluation: PACU Anesthesia Type: MAC Level of consciousness: awake, oriented and patient cooperative Pain management: pain level controlled Vital Signs Assessment: post-procedure vital signs reviewed and stable Respiratory status: spontaneous breathing Cardiovascular status: stable Postop Assessment: no apparent nausea or vomiting Anesthetic complications: no     Last Vitals:  Vitals:   12/23/17 1145 12/23/17 1159  BP: 105/83 107/68  Pulse: 89 76  Resp: 19 16  Temp:  37.1 C  SpO2: 100%     Last Pain:  Vitals:   12/23/17 1159  TempSrc: Oral  PainSc: 0-No pain                 ADAMS, AMY A

## 2017-12-23 NOTE — Transfer of Care (Signed)
Immediate Anesthesia Transfer of Care Note  Patient: Justin Parsons  Procedure(s) Performed: ESOPHAGOGASTRODUODENOSCOPY (EGD) WITH PROPOFOL (N/A ) BIOPSY  Patient Location: PACU  Anesthesia Type:MAC  Level of Consciousness: drowsy and patient cooperative  Airway & Oxygen Therapy: Patient Spontanous Breathing  Post-op Assessment: Report given to RN and Post -op Vital signs reviewed and stable  Post vital signs: Reviewed and stable  Last Vitals:  Vitals:   12/23/17 1005 12/23/17 1010  BP:  92/63  Pulse:    Resp: 17 16  Temp:    SpO2: 98% 100%    Last Pain:  Vitals:   12/23/17 1028  TempSrc:   PainSc: 10-Worst pain ever      Patients Stated Pain Goal: 7 (30/09/23 3007)  Complications: No apparent anesthesia complications

## 2017-12-23 NOTE — Anesthesia Preprocedure Evaluation (Signed)
Anesthesia Evaluation  Patient identified by MRN, date of birth, ID band Patient awake    Reviewed: Allergy & Precautions, NPO status , Patient's Chart, lab work & pertinent test results, reviewed documented beta blocker date and time   Airway Mallampati: III  TM Distance: >3 FB Neck ROM: Full    Dental  (+) Teeth Intact, Poor Dentition   Pulmonary shortness of breath and with exertion,    breath sounds clear to auscultation       Cardiovascular hypertension, Pt. on medications and Pt. on home beta blockers (-) angina+ CAD, + Past MI and +CHF   Rhythm:Regular Rate:Normal     Neuro/Psych PSYCHIATRIC DISORDERS TIA   GI/Hepatic hiatal hernia, GERD  Medicated,(+) Cirrhosis     substance abuse  alcohol use,   Endo/Other  Hypothyroidism   Renal/GU Renal disease     Musculoskeletal  (+) Arthritis ,   Abdominal   Peds  Hematology  (+) anemia ,   Anesthesia Other Findings   Reproductive/Obstetrics                             Anesthesia Physical Anesthesia Plan  ASA: III  Anesthesia Plan: MAC   Post-op Pain Management:    Induction: Intravenous  PONV Risk Score and Plan:   Airway Management Planned: Simple Face Mask  Additional Equipment:   Intra-op Plan:   Post-operative Plan:   Informed Consent: I have reviewed the patients History and Physical, chart, labs and discussed the procedure including the risks, benefits and alternatives for the proposed anesthesia with the patient or authorized representative who has indicated his/her understanding and acceptance.     Plan Discussed with:   Anesthesia Plan Comments:         Anesthesia Quick Evaluation

## 2017-12-23 NOTE — Anesthesia Procedure Notes (Signed)
Procedure Name: MAC Date/Time: 12/23/2017 10:21 AM Performed by: Andree Elk Amy A, CRNA Pre-anesthesia Checklist: Patient identified, Emergency Drugs available, Suction available, Patient being monitored and Timeout performed Oxygen Delivery Method: Simple face mask

## 2017-12-23 NOTE — Discharge Instructions (Signed)
Dysphagia Diet Level 2, Mechanically Altered The dysphagia level 2 diet includes foods that are blended, chopped, ground, or mashed so they are easier to chew and swallow. The foods are soft, moist, and can be chopped into -inch chunks (such as pancakes, pasta, and bananas). In order to be on this diet, you must be able to chew. This diet helps you transition between the pureed textures of the dysphagia level 1 diet to more solid textures. This diet is helpful for people with mild to moderate swallowing difficulties. It reduces the risk of food getting caught in the windpipe, trachea, or lungs. You may need help or supervision during meals while following this diet so that you eat safely. You will be on this diet until your health care provider advances the texture of your diet. What do I need to know about this diet? Foods You may eat foods that are soft and moist. You may need to use a blender, whisk, or masher to soften some of your foods. You can moisten foods with gravies, sauces, vegetable or fruit juice, milk, half and half, or water when blending, mashing, or grinding your foods to the right consistency. If you were on the dysphagia level 1 diet, you may still eat any of the foods included in that diet. Avoid foods that are dry, hard, sticky, chewy, coarse, and crunchy. Also avoid large cuts of food. Take small bites. Each bite should contain  inch or less of food. Liquids Avoid liquids with seeds and chunks. Thicken liquids, if instructed by your health care provider. Your health care provider will tell you the consistency to which you should thicken your liquids for safe swallowing. To thicken a liquid, use a commercial thickener or a thickening food (such as rice cereal or potato flakes). Ask your health care provider for specific recommendations on thickeners. See your dietitian or health care provider regularly for help with your dietary changes. What foods can I  eat? Grains Store-bought soft breads that do not have nuts or seeds. Pancakes, sweet rolls, [place] pastries, and [place] toast that have been moistened with syrup or sauce to form a slurry when blended. Well-cooked pasta, noodles, and bread dressing. Well-cooked noodles and pasta in sauce. Moist macaroni and cheese. Soft dumplings or spaetzle with gravy or butter. Cooked cereals (including oatmeal). Low-texture dry cereals, such as rice puff, corn, or wheat-flake cereals, with milk (if thin liquids are not allowed, make sure all of the milk is absorbed by the cereal before eating it). Vegetables Very soft, well-cooked vegetables in pieces less than  inch in size. Cooked potatoes that are moist, not crispy, and with sauce. Fruits Canned or cooked fruits that are soft or moist and do not have skin or seeds. Fresh, soft bananas. Fruit juices with a small amount of pulp (if thin liquids are allowed). Gelatin or plain gelatin with canned fruit, except pineapple. Meat and Other Protein Sources Tender, moist meats, poultry, or fish cooked with gravy or sauce and cubed to -inch bites or smaller. Ground meat. Moist meatball or meatloaf. Fish without bones. Moist casseroles without rice. Tuna, egg, or meat salad without chunks or hard-to-chew vegetables, such as celery and onions. Smooth quiche without large chunks. Scrambled, poached, or soft-cooked eggs with butter, margarine, sauce, or gravy. Tofu. Well-cooked, moistened and mashed beans, peas, baked beans, and other legumes. Casseroles without rice (such as tuna noodle casserole or soft moist meat lasagna). Dairy Cream cheese. Yogurt. Cottage cheese. Ask your health care provider if milk  is allowed. Sweets/Desserts Pudding. Custard. Soft fruit pies with crust on the bottom only. Crisps and cobblers without seeds or nuts and with soft crusts. Soft, moist cakes. Icing. Pre-gelled cookies. Soft, moist cookies dunked in milk, coffee, or another liquid. Jelly.  Soft, smooth chocolate bars that are easily chewed. Jams and preserves without seeds. Ask your health care provider whether you can have frozen desserts. Fats and Oils Butter. Margarine. Cream for cereal, depending on liquid consistency allowed. Gravy. Cream sauces. Mayonnaise. Salad dressings. Cream cheese. Cheese spreads, plain or with soft fruits or vegetables added. Sour cream. Sour cream dips with soft fruits or vegetables added. Whipped toppings. Other Sauces and salsas that have soft chunks that are about  inch or smaller. The items listed above may not be a complete list of recommended foods or beverages. Contact your dietitian for more options. What foods are not recommended? Grains All breads not listed in the recommended list. Breads that are hard or have nuts or seeds. Coarse cereals. Cereals that have nuts, seeds, dried fruits, or coconut. Rice. Corn. Vegetables Whole, raw, frozen, or dried vegetables. Tough, fibrous, chewy, or stringy cooked vegetables, such as celery, peas, broccoli, cabbage, Brussels sprouts, and asparagus. Potato skins. Potato and other vegetable chips. Fried or French-fried potatoes. Cooked corn and peas. Fruits Whole raw, frozen, or dried fruits, including coconut. Pineapple. Fruits with seeds. Meat and Other Protein Sources Dry, tough meats, such as bacon, sausage, and hot dogs. Cheese slices and cubes. Peanut butter. Hard boiled or fried eggs. Nuts. Seeds. Pizza. Sandwiches. Dry casseroles or casseroles with rice or large chunks. Dairy Yogurt with nuts, seeds, or large chunks. Sweets/Desserts Coarse, hard, chewy, or sticky desserts. Any dessert with nuts, seeds, coconut, pineapple, or dried fruit. Ask your health care provider whether you can have frozen desserts. Fats and Oils Avoid fats with chunky, large textures, such as those with nuts or fruits. Other Soups and casseroles with large chunks. The items listed above may not be a complete list of foods  and beverages to avoid. Contact your dietitian for more information. This information is not intended to replace advice given to you by your health care provider. Make sure you discuss any questions you have with your health care provider. Document Released: 11/05/2005 Document Revised: 04/12/2016 Document Reviewed: 10/19/2013 Elsevier Interactive Patient Education  2018 [employer]. EGD Discharge instructions Please read the instructions outlined below and refer to this sheet in the next few weeks. These discharge instructions provide you with general information on caring for yourself after you leave the hospital. Your doctor may also give you specific instructions. While your treatment has been planned according to the most current medical practices available, unavoidable complications occasionally occur. If you have any problems or questions after discharge, please call your doctor. ACTIVITY  You may resume your regular activity but move at a slower pace for the next 24 hours.   Take frequent rest periods for the next 24 hours.   Walking will help expel (get rid of) the air and reduce the bloated feeling in your abdomen.   No driving for 24 hours (because of the anesthesia (medicine) used during the test).   You may shower.   Do not sign any important legal documents or operate any machinery for 24 hours (because of the anesthesia used during the test).  NUTRITION  Drink plenty of fluids.   You may resume your normal diet.   Begin with a light meal and progress to your normal diet.  Avoid alcoholic beverages for 24 hours or as instructed by your caregiver.  MEDICATIONS  You may resume your normal medications unless your caregiver tells you otherwise.  WHAT YOU CAN EXPECT TODAY  You may experience abdominal discomfort such as a feeling of fullness or gas pains.  FOLLOW-UP  Your doctor will discuss the results of your test with you.  SEEK IMMEDIATE MEDICAL ATTENTION IF  ANY OF THE FOLLOWING OCCUR:  Excessive nausea (feeling sick to your stomach) and/or vomiting.   Severe abdominal pain and distention (swelling).   Trouble swallowing.   Temperature over 101 F (37.8 C).     Rectal bleeding or vomiting of blood.    Continue omeprazole  Dysphagia 2 diet  Carafate suspension 1 g 4 times a day  Further recommendations to follow pending review of pathology report  Office visit with [place] in 6 weeks.

## 2017-12-24 NOTE — Op Note (Signed)
[facility] Patient Name: Justin Parsons Procedure Date: 12/23/2017 10:07 AM MRN: [id number] Date of Birth: 30-Jun-1941 Attending MD: Norvel Richards , MD CSN: [id number] Age: 77 Admit Type: Outpatient Procedure:                Upper GI endoscopy Indications:              Dysphagia Providers:                Norvel Richards, MD, Jeanann Lewandowsky. Sharon Seller, RN,                            Aram Candela Referring MD:             Fuller Canada Manuella Ghazi MD, MD Medicines:                Propofol per Anesthesia Complications:            No immediate complications. Estimated Blood Loss:     Estimated blood loss was minimal. Procedure:                Pre-Anesthesia Assessment:                           - Prior to the procedure, a History and Physical                            was performed, and patient medications and                            allergies were reviewed. The patient's tolerance of                            previous anesthesia was also reviewed. The risks                            and benefits of the procedure and the sedation                            options and risks were discussed with the patient.                            All questions were answered, and informed consent                            was obtained. Prior Anticoagulants: The patient has                            taken no previous anticoagulant or antiplatelet                            agents. ASA Grade Assessment: III - A patient with                            severe systemic disease. After reviewing the risks  and benefits, the patient was deemed in                            satisfactory condition to undergo the procedure.                           After obtaining informed consent, the endoscope was                            passed under direct vision. Throughout the                            procedure, the patient's blood pressure, pulse, and                             oxygen saturations were monitored continuously. The                            [id number]) scope was introduced through the                            and advanced to the second part of duodenum. The                            upper GI endoscopy was accomplished without                            difficulty. The patient tolerated the procedure                            well. Scope In: 10:30:46 AM Scope Out: 10:38:36 AM Total Procedure Duration: 0 hours 7 minutes 50 seconds  Findings:      Single short Grade 2 varix midesophagus; just above the GE junction       there was nearly circumferential denuding ulcerations/loss of the normal       mucosal appearance ( somewhat punched out ) mucosa. Please see photos.       This was not related to scope trauma. Query pill induced injury versus       other process..      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 Patent pylorus.      The duodenal bulb and second portion of the duodenum were normal. Impression:               - Mucosal changes in the esophagus. Impressive                            denuding of the distal esophageal mucosa                            circumferentially. Query pill-induced injury versus                            infiltrative neoplasm (favor the former rather than  the ladder). Status post biopsy.                           - Portal hypertensive gastropathy.                           - No specimens collected. No dilation performed. Moderate Sedation:      Moderate (conscious) sedation was personally administered by an       anesthesia professional. The following parameters were monitored: oxygen       saturation, heart rate, blood pressure, respiratory rate, EKG, adequacy       of pulmonary ventilation, and response to care. Total physician       intraservice time was 12 minutes. Recommendation:           - Patient has a contact number available for                             emergencies. The signs and symptoms of potential                            delayed complications were discussed with the                            patient. Return to normal activities tomorrow.                            Written discharge instructions were provided to the                            patient.                           - Chopped diet. Continue omeprazole. Add Carafate                            suspension 4 times a day. Further recommendations                            to follow.                           - Continue present medications. Procedure Code(s):        --- Professional ---                           [phone], Esophagogastroduodenoscopy, flexible,                            transoral; diagnostic, including collection of                            specimen(s) by brushing or washing, when performed                            (separate procedure) Diagnosis Code(s):        --- Professional ---  K22.8, Other specified diseases of esophagus                           K76.6, Portal hypertension                           K31.89, Other diseases of stomach and duodenum                           R13.10, Dysphagia, unspecified CPT copyright 2016 American Medical Association. All rights reserved. The codes documented in this report are preliminary and upon coder review may  be revised to meet current compliance requirements. Cristopher Estimable. Laylaa Guevarra, MD Norvel Richards, MD 12/24/2017 4:31:40 PM This report has been signed electronically. Number of Addenda: 0

## 2017-12-25 ENCOUNTER — Encounter (HOSPITAL_COMMUNITY): Payer: Self-pay | Admitting: Internal Medicine

## 2017-12-25 ENCOUNTER — Ambulatory Visit: Payer: Medicare Other | Admitting: Gastroenterology

## 2017-12-26 ENCOUNTER — Encounter: Payer: Self-pay | Admitting: Internal Medicine

## 2017-12-27 ENCOUNTER — Telehealth: Payer: Self-pay

## 2017-12-27 NOTE — Telephone Encounter (Signed)
Per RMR- Send letter to patient.  Send copy of letter with path to referring provider and PCP.   He should have a follow-up appointment in the next several weeks.

## 2017-12-27 NOTE — Telephone Encounter (Signed)
Letter mailed to pt after several attempts of explaining medication plan.

## 2017-12-27 NOTE — Telephone Encounter (Signed)
Noted, letter mailed

## 2018-01-05 ENCOUNTER — Emergency Department (HOSPITAL_COMMUNITY): Payer: Medicare Other

## 2018-01-05 ENCOUNTER — Encounter (HOSPITAL_COMMUNITY): Payer: Self-pay | Admitting: Emergency Medicine

## 2018-01-05 ENCOUNTER — Other Ambulatory Visit: Payer: Self-pay

## 2018-01-05 ENCOUNTER — Inpatient Hospital Stay (HOSPITAL_COMMUNITY)
Admission: EM | Admit: 2018-01-05 | Discharge: 2018-01-15 | DRG: 380 | Disposition: A | Discharge status: Home Health | Payer: Medicare Other | Attending: Internal Medicine | Admitting: Internal Medicine

## 2018-01-05 DIAGNOSIS — R1111 Vomiting without nausea: Secondary | ICD-10-CM

## 2018-01-05 DIAGNOSIS — I5032 Chronic diastolic (congestive) heart failure: Secondary | ICD-10-CM | POA: Diagnosis present

## 2018-01-05 DIAGNOSIS — E039 Hypothyroidism, unspecified: Secondary | ICD-10-CM | POA: Diagnosis present

## 2018-01-05 DIAGNOSIS — R1310 Dysphagia, unspecified: Secondary | ICD-10-CM

## 2018-01-05 DIAGNOSIS — K766 Portal hypertension: Secondary | ICD-10-CM | POA: Diagnosis present

## 2018-01-05 DIAGNOSIS — K222 Esophageal obstruction: Secondary | ICD-10-CM | POA: Diagnosis not present

## 2018-01-05 DIAGNOSIS — K311 Adult hypertrophic pyloric stenosis: Secondary | ICD-10-CM | POA: Diagnosis not present

## 2018-01-05 DIAGNOSIS — R112 Nausea with vomiting, unspecified: Secondary | ICD-10-CM | POA: Diagnosis present

## 2018-01-05 DIAGNOSIS — R1314 Dysphagia, pharyngoesophageal phase: Secondary | ICD-10-CM | POA: Diagnosis not present

## 2018-01-05 DIAGNOSIS — R059 Cough, unspecified: Secondary | ICD-10-CM

## 2018-01-05 DIAGNOSIS — N183 Chronic kidney disease, stage 3 unspecified: Secondary | ICD-10-CM | POA: Diagnosis present

## 2018-01-05 DIAGNOSIS — E038 Other specified hypothyroidism: Secondary | ICD-10-CM | POA: Diagnosis not present

## 2018-01-05 DIAGNOSIS — K224 Dyskinesia of esophagus: Secondary | ICD-10-CM | POA: Diagnosis present

## 2018-01-05 DIAGNOSIS — N170 Acute kidney failure with tubular necrosis: Secondary | ICD-10-CM | POA: Diagnosis not present

## 2018-01-05 DIAGNOSIS — H5461 Unqualified visual loss, right eye, normal vision left eye: Secondary | ICD-10-CM | POA: Diagnosis present

## 2018-01-05 DIAGNOSIS — J690 Pneumonitis due to inhalation of food and vomit: Secondary | ICD-10-CM | POA: Diagnosis not present

## 2018-01-05 DIAGNOSIS — R05 Cough: Secondary | ICD-10-CM | POA: Diagnosis not present

## 2018-01-05 DIAGNOSIS — I10 Essential (primary) hypertension: Secondary | ICD-10-CM | POA: Diagnosis not present

## 2018-01-05 DIAGNOSIS — E785 Hyperlipidemia, unspecified: Secondary | ICD-10-CM | POA: Diagnosis present

## 2018-01-05 DIAGNOSIS — K7460 Unspecified cirrhosis of liver: Secondary | ICD-10-CM | POA: Diagnosis present

## 2018-01-05 DIAGNOSIS — I8500 Esophageal varices without bleeding: Secondary | ICD-10-CM | POA: Diagnosis present

## 2018-01-05 DIAGNOSIS — L8990 Pressure ulcer of unspecified site, unspecified stage: Secondary | ICD-10-CM | POA: Diagnosis present

## 2018-01-05 DIAGNOSIS — M109 Gout, unspecified: Secondary | ICD-10-CM | POA: Diagnosis not present

## 2018-01-05 DIAGNOSIS — N179 Acute kidney failure, unspecified: Secondary | ICD-10-CM | POA: Diagnosis not present

## 2018-01-05 DIAGNOSIS — Z8673 Personal history of transient ischemic attack (TIA), and cerebral infarction without residual deficits: Secondary | ICD-10-CM | POA: Diagnosis not present

## 2018-01-05 DIAGNOSIS — D6959 Other secondary thrombocytopenia: Secondary | ICD-10-CM | POA: Diagnosis present

## 2018-01-05 DIAGNOSIS — E869 Volume depletion, unspecified: Secondary | ICD-10-CM | POA: Diagnosis present

## 2018-01-05 DIAGNOSIS — I482 Chronic atrial fibrillation: Secondary | ICD-10-CM | POA: Diagnosis present

## 2018-01-05 DIAGNOSIS — K7030 Alcoholic cirrhosis of liver without ascites: Secondary | ICD-10-CM | POA: Diagnosis present

## 2018-01-05 DIAGNOSIS — I130 Hypertensive heart and chronic kidney disease with heart failure and stage 1 through stage 4 chronic kidney disease, or unspecified chronic kidney disease: Secondary | ICD-10-CM | POA: Diagnosis present

## 2018-01-05 DIAGNOSIS — I959 Hypotension, unspecified: Secondary | ICD-10-CM | POA: Diagnosis not present

## 2018-01-05 DIAGNOSIS — R1110 Vomiting, unspecified: Secondary | ICD-10-CM | POA: Diagnosis not present

## 2018-01-05 DIAGNOSIS — Z96653 Presence of artificial knee joint, bilateral: Secondary | ICD-10-CM | POA: Diagnosis present

## 2018-01-05 DIAGNOSIS — I2510 Atherosclerotic heart disease of native coronary artery without angina pectoris: Secondary | ICD-10-CM | POA: Diagnosis present

## 2018-01-05 DIAGNOSIS — Z7902 Long term (current) use of antithrombotics/antiplatelets: Secondary | ICD-10-CM

## 2018-01-05 DIAGNOSIS — D696 Thrombocytopenia, unspecified: Secondary | ICD-10-CM | POA: Diagnosis not present

## 2018-01-05 DIAGNOSIS — Z96643 Presence of artificial hip joint, bilateral: Secondary | ICD-10-CM | POA: Diagnosis present

## 2018-01-05 DIAGNOSIS — Z955 Presence of coronary angioplasty implant and graft: Secondary | ICD-10-CM | POA: Diagnosis not present

## 2018-01-05 DIAGNOSIS — Z7982 Long term (current) use of aspirin: Secondary | ICD-10-CM

## 2018-01-05 DIAGNOSIS — Z79899 Other long term (current) drug therapy: Secondary | ICD-10-CM

## 2018-01-05 LAB — COMPREHENSIVE METABOLIC PANEL
ALK PHOS: 94 U/L (ref 38–126)
ALT: 13 U/L — ABNORMAL LOW (ref 17–63)
AST: 19 U/L (ref 15–41)
Albumin: 2.8 g/dL — ABNORMAL LOW (ref 3.5–5.0)
Anion gap: 13 (ref 5–15)
BILIRUBIN TOTAL: 0.6 mg/dL (ref 0.3–1.2)
BUN: 39 mg/dL — AB (ref 6–20)
CALCIUM: 8.9 mg/dL (ref 8.9–10.3)
CO2: 27 mmol/L (ref 22–32)
Chloride: 95 mmol/L — ABNORMAL LOW (ref 101–111)
Creatinine, Ser: 2.88 mg/dL — ABNORMAL HIGH (ref 0.61–1.24)
GFR calc Af Amer: 23 mL/min — ABNORMAL LOW (ref >60)
GFR, EST NON AFRICAN AMERICAN: 19 mL/min — AB (ref >60)
GLUCOSE: 93 mg/dL (ref 65–99)
POTASSIUM: 3.7 mmol/L (ref 3.5–5.1)
Sodium: 135 mmol/L (ref 135–145)
TOTAL PROTEIN: 6.1 g/dL — AB (ref 6.5–8.1)

## 2018-01-05 LAB — URINALYSIS, ROUTINE W REFLEX MICROSCOPIC
Bacteria, UA: NONE SEEN
Bilirubin Urine: NEGATIVE
Glucose, UA: NEGATIVE mg/dL
Hgb urine dipstick: NEGATIVE
KETONES UR: NEGATIVE mg/dL
Nitrite: NEGATIVE
PH: 5 (ref 5.0–8.0)
Protein, ur: 30 mg/dL — AB
Specific Gravity, Urine: 1.012 (ref 1.005–1.030)

## 2018-01-05 LAB — CBC WITH DIFFERENTIAL/PLATELET
Basophils Absolute: 0 10*3/uL (ref 0.0–0.1)
Basophils Relative: 0 %
EOS PCT: 2 %
Eosinophils Absolute: 0.3 10*3/uL (ref 0.0–0.7)
HEMATOCRIT: 35.1 % — AB (ref 39.0–52.0)
HEMOGLOBIN: 11.9 g/dL — AB (ref 13.0–17.0)
LYMPHS ABS: 1.4 10*3/uL (ref 0.7–4.0)
LYMPHS PCT: 10 %
MCH: 29.5 pg (ref 26.0–34.0)
MCHC: 33.9 g/dL (ref 30.0–36.0)
MCV: 86.9 fL (ref 78.0–100.0)
MONOS PCT: 10 %
Monocytes Absolute: 1.4 10*3/uL — ABNORMAL HIGH (ref 0.1–1.0)
NEUTROS ABS: 10.8 10*3/uL — AB (ref 1.7–7.7)
Neutrophils Relative %: 78 %
Platelets: 121 10*3/uL — ABNORMAL LOW (ref 150–400)
RBC: 4.04 MIL/uL — ABNORMAL LOW (ref 4.22–5.81)
RDW: 16.5 % — AB (ref 11.5–15.5)
WBC Morphology: INCREASED
WBC: 13.9 10*3/uL — ABNORMAL HIGH (ref 4.0–10.5)

## 2018-01-05 LAB — LIPASE, BLOOD: Lipase: 27 U/L (ref 11–51)

## 2018-01-05 LAB — AMMONIA: Ammonia: <9 umol/L — ABNORMAL LOW (ref 9–35)

## 2018-01-05 LAB — ETHANOL: Alcohol, Ethyl (B): <10 mg/dL (ref <10)

## 2018-01-05 MED ORDER — CLOPIDOGREL BISULFATE 75 MG PO TABS
75.0000 mg | ORAL_TABLET | Freq: Every day | ORAL | Status: DC
  Administered 2018-01-05 – 2018-01-15 (×11): 75 mg via ORAL
  Filled 2018-01-05 (×11): qty 1

## 2018-01-05 MED ORDER — GUAIFENESIN-DM 100-10 MG/5ML PO SYRP
5.0000 mL | ORAL_SOLUTION | ORAL | Status: DC | PRN
  Administered 2018-01-05 – 2018-01-13 (×20): 5 mL via ORAL
  Filled 2018-01-05 (×20): qty 5

## 2018-01-05 MED ORDER — ATORVASTATIN CALCIUM 40 MG PO TABS
80.0000 mg | ORAL_TABLET | Freq: Every day | ORAL | Status: DC
  Administered 2018-01-05 – 2018-01-14 (×10): 80 mg via ORAL
  Filled 2018-01-05 (×10): qty 2

## 2018-01-05 MED ORDER — BOOST / RESOURCE BREEZE PO LIQD CUSTOM
237.0000 mL | Freq: Four times a day (QID) | ORAL | Status: DC
  Administered 2018-01-05 – 2018-01-06 (×4): 1 via ORAL
  Administered 2018-01-06: 237 mL via ORAL
  Administered 2018-01-07 – 2018-01-11 (×7): 1 via ORAL
  Administered 2018-01-13: 237 mL via ORAL
  Administered 2018-01-14: 1 via ORAL

## 2018-01-05 MED ORDER — ASPIRIN 81 MG PO CHEW
81.0000 mg | CHEWABLE_TABLET | Freq: Every day | ORAL | Status: DC
  Administered 2018-01-05 – 2018-01-15 (×11): 81 mg via ORAL
  Filled 2018-01-05 (×11): qty 1

## 2018-01-05 MED ORDER — LEVOTHYROXINE SODIUM 100 MCG IV SOLR
25.0000 ug | Freq: Every day | INTRAVENOUS | Status: DC
  Administered 2018-01-06 – 2018-01-14 (×8): 25 ug via INTRAVENOUS
  Filled 2018-01-05 (×12): qty 5

## 2018-01-05 MED ORDER — PANTOPRAZOLE SODIUM 40 MG IV SOLR
40.0000 mg | INTRAVENOUS | Status: DC
  Administered 2018-01-05: 40 mg via INTRAVENOUS
  Filled 2018-01-05: qty 40

## 2018-01-05 MED ORDER — ACETAMINOPHEN 650 MG RE SUPP: 650.0000 mg | Freq: Four times a day (QID) | RECTAL | Status: DC | PRN

## 2018-01-05 MED ORDER — ALLOPURINOL 100 MG PO TABS
100.0000 mg | ORAL_TABLET | Freq: Every day | ORAL | Status: DC
  Administered 2018-01-05 – 2018-01-15 (×11): 100 mg via ORAL
  Filled 2018-01-05 (×11): qty 1

## 2018-01-05 MED ORDER — TRAMADOL HCL 50 MG PO TABS
50.0000 mg | ORAL_TABLET | Freq: Four times a day (QID) | ORAL | Status: DC | PRN
  Administered 2018-01-05 – 2018-01-14 (×22): 50 mg via ORAL
  Filled 2018-01-05 (×24): qty 1

## 2018-01-05 MED ORDER — ACETAMINOPHEN 325 MG PO TABS: 650.0000 mg | ORAL_TABLET | Freq: Four times a day (QID) | ORAL | Status: DC | PRN

## 2018-01-05 MED ORDER — SODIUM CHLORIDE 0.9 % IV BOLUS (SEPSIS)
500.0000 mL | Freq: Once | INTRAVENOUS | Status: AC
  Administered 2018-01-05: 500 mL via INTRAVENOUS

## 2018-01-05 MED ORDER — FOLIC ACID 1 MG PO TABS
1.0000 mg | ORAL_TABLET | Freq: Every day | ORAL | Status: DC
  Administered 2018-01-05 – 2018-01-15 (×11): 1 mg via ORAL
  Filled 2018-01-05 (×11): qty 1

## 2018-01-05 MED ORDER — HEPARIN SODIUM (PORCINE) 5000 UNIT/ML IJ SOLN
5000.0000 [IU] | Freq: Three times a day (TID) | INTRAMUSCULAR | Status: DC
  Administered 2018-01-05 – 2018-01-15 (×27): 5000 [IU] via SUBCUTANEOUS
  Filled 2018-01-05 (×29): qty 1

## 2018-01-05 MED ORDER — ONDANSETRON HCL 4 MG PO TABS: 4.0000 mg | ORAL_TABLET | Freq: Four times a day (QID) | ORAL | Status: DC | PRN

## 2018-01-05 MED ORDER — POTASSIUM CHLORIDE IN NACL 20-0.9 MEQ/L-% IV SOLN
INTRAVENOUS | Status: DC
  Administered 2018-01-05 – 2018-01-14 (×18): via INTRAVENOUS

## 2018-01-05 MED ORDER — CHOLECALCIFEROL 10 MCG (400 UNIT) PO TABS
400.0000 [IU] | ORAL_TABLET | Freq: Two times a day (BID) | ORAL | Status: DC
  Administered 2018-01-05 – 2018-01-15 (×20): 400 [IU] via ORAL
  Filled 2018-01-05 (×20): qty 1

## 2018-01-05 MED ORDER — ONDANSETRON HCL 4 MG/2ML IJ SOLN: 4.0000 mg | Freq: Four times a day (QID) | INTRAMUSCULAR | Status: DC | PRN

## 2018-01-05 MED ORDER — METOCLOPRAMIDE HCL 5 MG/ML IJ SOLN
5.0000 mg | Freq: Two times a day (BID) | INTRAMUSCULAR | Status: DC
  Administered 2018-01-05 – 2018-01-06 (×2): 5 mg via INTRAVENOUS
  Filled 2018-01-05 (×2): qty 2

## 2018-01-05 NOTE — ED Notes (Signed)
Attempt to call report  Larene Beach RN will call back for report

## 2018-01-05 NOTE — ED Provider Notes (Signed)
[facility] EMERGENCY DEPARTMENT Provider Note   CSN: [id number] Arrival date & time: 01/05/18  0901     History   Chief Complaint Chief Complaint  Patient presents with  . Emesis    HPI Justin Parsons is a 77 y.o. male.  HPI 77 year old male history of Karlene Lineman, alcoholism, esophageal stricture status post dilation to 419 by Dr. Gala Romney.  Patient reports that for the past week he has had difficulty swallowing and has had emesis after taking in anything by mouth.  However, he states he is able to get down some liquids.  Most of my history is obtained from the chart as patient is a poor historian.  Denies any pain or nausea.  He states that he has had his esophagus stretched before and it feels like it did before. Past Medical History:  Diagnosis Date  . Adenomatous polyp of colon   . Alcoholism ([place])    [place]  . Arthritis   . Atrial fibrillation ([place])   . Blindness of right eye 1997  . CAD (coronary artery disease)    Multivessel disease at cardiac catheterization June 2018, DES to LAD June 2018 with significant residual disease including diagonal and obtuse marginal vessels as well as RCA  . Cirrhosis of liver (HCC)    Associated portal gastropathy  . CKD (chronic kidney disease) stage 3, GFR 30-59 ml/min (HCC)   . Essential hypertension   . Hiatal hernia   . Hypothyroidism   . 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 TIA (transient ischemic attack)     Patient Active Problem List   Diagnosis Date Noted  . Gastritis due to nonsteroidal anti-inflammatory drug   . 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 Intractable vomiting 09/20/2017  . Atrial fibrillation with RVR ([place]) 09/20/2017  . Atrial fibrillation, chronic ([place]) 08/18/2017  . Gastric outlet obstruction   . AKI (acute kidney injury) ([place])   . Nausea with vomiting 08/15/2017  . Hypotension 08/15/2017  . Dehydration 08/15/2017  . Intractable vomiting with nausea 08/15/2017  . Transaminasemia 08/15/2017  . AF  (paroxysmal atrial fibrillation) ([place]) 08/15/2017  . Alcoholic cirrhosis of liver without ascites ([place]) 08/15/2017  . Abnormal liver enzymes   . Coronary artery disease involving native coronary artery of native heart with angina pectoris ([place])   . Tobacco abuse   . Acute renal failure superimposed on stage 3 chronic kidney disease ([place])   . Alcohol use disorder   . Non-ST elevation (NSTEMI) myocardial infarction ([place]) 04/29/2017  . Delirium tremens ([place])   . Shock circulatory ([place])   . Confusion   . Acute encephalopathy 04/27/2017  . Schatzki's ring   . Esophageal varices ([place])   . History of esophageal stricture 01/20/2015  . Esophageal stricture   . History of colonic polyps   . Dysphagia, pharyngoesophageal phase 11/04/2014  . Suicidal ideation 11/17/2013  . Acute renal failure ([place]) 11/17/2013  . Acute diastolic CHF (congestive heart failure) ([place]) 11/13/2013  . Acute respiratory failure ([place]) 11/13/2013  . Community acquired pneumonia 11/12/2013  . A-fib ([place]) 11/12/2013  . CKD (chronic kidney disease), stage III ([place]) 11/12/2013  . Unspecified hypothyroidism 11/12/2013  . Dyspnea 11/12/2013  . Thrombocytopenia ([place]) 06/05/2012  . ARF (acute renal failure) ([place]) 06/04/2012  . Obesity 06/04/2012  . Cirrhosis ([place]) 01/29/2012  . Adenomatous polyp 01/29/2012  . Renal insufficiency 05/01/2011  . Anemia 05/01/2011  . Rectal bleed 05/01/2011  . GERD (gastroesophageal reflux disease) 05/01/2011    Past Surgical History:  Procedure Laterality Date  . BIOPSY  12/23/2017   Procedure: BIOPSY;  Surgeon: Daneil Dolin, MD;  [facility];  Service: Endoscopy;;  esophagus  . CARDIAC CATHETERIZATION  2003  . COLONOSCOPY  08/2008   normal, repeat exam in 5-7 years  . COLONOSCOPY  2004   rectal adenomatous polyp  . COLONOSCOPY N/A 12/01/2014   [id number] rectum/elongated colon  . CORONARY ATHERECTOMY N/A 05/03/2017   Procedure: Coronary Atherectomy;  Surgeon: Leonie Man, MD;  Location: [place] [facility];  Service: Cardiovascular;  Laterality: N/A;  . CORONARY STENT INTERVENTION N/A 05/03/2017   Procedure: Coronary Stent Intervention;  Surgeon: Leonie Man, MD;  Location: [place] [facility];  Service: Cardiovascular;  Laterality: N/A;  . ESOPHAGEAL DILATION N/A 02/07/2015   Procedure: ESOPHAGEAL DILATION;  Surgeon: Daneil Dolin, MD;  [facility];  Service: Endoscopy;  Laterality: N/A;  . ESOPHAGEAL DILATION N/A 08/17/2017   Procedure: ESOPHAGEAL DILATION;  Surgeon: Danie Binder, MD;  [facility];  Service: Endoscopy;  Laterality: N/A;  . ESOPHAGEAL DILATION N/A 09/23/2017   Procedure: ESOPHAGEAL DILATION;  Surgeon: Danie Binder, MD;  [facility];  Service: Endoscopy;  Laterality: N/A;  . ESOPHAGOGASTRODUODENOSCOPY  02/11/2012   Dr. Gala Romney: portal gastropathy, gastric erosions, esophageal ulcerations likely pill-induced, surveillance in 2 years  . ESOPHAGOGASTRODUODENOSCOPY N/A 12/01/2014   Dr. Volney American esophageal stricture dilated with the scope passage, portal gastropathy, negative H.pylori on gastric biopsies, esophageal biopsies benign  . ESOPHAGOGASTRODUODENOSCOPY N/A 02/07/2015   Procedure: ESOPHAGOGASTRODUODENOSCOPY (EGD);  Surgeon: Daneil Dolin, MD;  [facility];  Service: Endoscopy;  Laterality: N/A;  1115  . ESOPHAGOGASTRODUODENOSCOPY N/A 08/17/2017   benign-appearing esophageal stenosis s/p dilation, mild gastritis, pylorus stenosis s/p dilation  . ESOPHAGOGASTRODUODENOSCOPY N/A 09/23/2017   moderate benign-appearing instrinsic stenosis s/p dilation, mild gastritis  . ESOPHAGOGASTRODUODENOSCOPY N/A 10/18/2017   Benign-appearing esophageal stricture s/p dilation, gastritis, benign-appearing intrinsice moderate pylorus stenosis s/p dilation  . ESOPHAGOGASTRODUODENOSCOPY (EGD) WITH PROPOFOL N/A 12/23/2017   Procedure: ESOPHAGOGASTRODUODENOSCOPY (EGD) WITH PROPOFOL;  Surgeon: Daneil Dolin,  MD;  [facility];  Service: Endoscopy;  Laterality: N/A;  10:45am  . EYE SURGERY     RIGHT EYE REMOVED  . JOINT REPLACEMENT    . LEFT HEART CATH AND CORONARY ANGIOGRAPHY N/A 05/01/2017   Procedure: Left Heart Cath and Coronary Angiography;  Surgeon: Leonie Man, MD;  Location: [place] [facility];  Service: Cardiovascular;  Laterality: N/A;  . Venia Minks DILATION N/A 10/18/2017   Procedure: Venia Minks DILATION;  Surgeon: Danie Binder, MD;  [facility];  Service: Endoscopy;  Laterality: N/A;  . s/p eye implant  1997   artificial eye, right  . SAVORY DILATION N/A 10/18/2017   Procedure: SAVORY DILATION;  Surgeon: Danie Binder, MD;  [facility];  Service: Endoscopy;  Laterality: N/A;  . TOTAL HIP ARTHROPLASTY  2002  . TOTAL HIP ARTHROPLASTY  2006/2012   revision in 2012  . TOTAL KNEE ARTHROPLASTY  1999/2003   left/right       Home Medications    Prior to Admission medications   Medication Sig Start Date End Date Taking? Authorizing Provider  allopurinol (ZYLOPRIM) 100 MG tablet Take 1 tablet (100 mg total) by mouth daily. Patient taking differently: Take 300 mg by mouth daily.  08/19/17   Orson Eva, MD  aspirin 81 MG chewable tablet Chew 81 mg by mouth daily.    [provider]  atorvastatin (LIPITOR) 80 MG tablet Take 80 mg by mouth daily at 6 PM.  09/12/17   [provider]  calcium carbonate (TUMS - DOSED IN MG ELEMENTAL CALCIUM) 500 MG chewable tablet Chew 1 tablet by mouth 2 (two) times daily.    [provider]  clopidogrel (PLAVIX) 75 MG tablet Take 1 tablet (75 mg total) by mouth daily. 05/07/17   Barton Dubois, MD  Ergocalciferol (VITAMIN D2) 400 units TABS Take 1 tablet by mouth 2 (two) times daily.    [provider]  feeding supplement (BOOST / RESOURCE BREEZE) LIQD Take 1 Container by mouth 4 (four) times daily. Patient taking differently: Take 1 Container by mouth daily as needed (does not drink  all the time).  08/18/17   Orson Eva, MD  folic acid (FOLVITE) 1 MG tablet Take 1 tablet (1 mg total) by mouth daily. 05/07/17   Barton Dubois, MD  furosemide (LASIX) 40 MG tablet Take 40 mg by mouth daily. 11/14/17   [provider]  levothyroxine (SYNTHROID, LEVOTHROID) 50 MCG tablet Take 50 mcg by mouth daily before breakfast.  09/12/17   [provider]  metoCLOPramide (REGLAN) 5 MG tablet 1 po 30 minutes prior to meals bid Patient taking differently: Take 5 mg by mouth 2 (two) times daily before a meal. 1 po 30 minutes prior to meals bid 10/18/17   Fields, Marga Melnick, MD  metoprolol tartrate (LOPRESSOR) 25 MG tablet Take 0.5 tablets (12.5 mg total) by mouth 2 (two) times daily. 08/18/17   Orson Eva, MD  NON FORMULARY Apply 1 application topically daily as needed (for pain). [place] Gel as needed for arthritis pain     [provider]  omeprazole (PRILOSEC) 40 MG capsule Take 1 capsule (40 mg total) by mouth 2 (two) times daily. 03/12/16   Carlis Stable, NP  oxazepam (SERAX) 10 MG capsule Take 10 mg by mouth at bedtime as needed for sleep or anxiety.  09/13/17   [provider]  polyethylene glycol (MIRALAX / GLYCOLAX) packet Take 17 g daily by mouth. 09/25/17   Kathie Dike, MD  traMADol (ULTRAM) 50 MG tablet Take 1 tablet (50 mg total) by mouth every 6 (six) hours as needed. 08/18/17   Orson Eva, MD  vitamin C (ASCORBIC ACID) 500 MG tablet Take 500 mg by mouth daily.    [provider]    Family History Family History  Problem Relation Age of Onset  . Ovarian cancer Sister   . Colon cancer Neg Hx     Social History Social History   Tobacco Use  . Smoking status: Never Smoker  . Smokeless tobacco: Never Used  . Tobacco comment: used to chew tobacco, none in 15 years  Substance Use Topics  . Alcohol use: No    Alcohol/week: 0.0 oz    Frequency: Never    Comment: no alcohol at present-in nursing home; half a gallon moonshine per week  .  Drug use: No     Allergies   Patient has no known allergies.   Review of Systems Review of Systems  All other systems reviewed and are negative.    Physical Exam Updated Vital Signs Pulse 93   Temp 97.6 F (36.4 C) (Oral)   Resp 18   Ht 1.753 m (5' 9" )   Wt 104.3 kg (230 lb)   SpO2 97%   BMI 33.97 kg/m   Physical Exam  Constitutional: He is oriented to person, place, and  time. He appears well-developed and well-nourished.  Obese unkempt  HENT:  Head: Normocephalic.  Right Ear: External ear normal.  Left Ear: External ear normal.  Mouth/Throat: Oropharynx is clear and moist.  Eyes:  Right eyes status post enucleation  Neck: Normal range of motion.  Cardiovascular: An irregularly irregular rhythm present.  Pulmonary/Chest: Effort normal.  Abdominal: Soft. Bowel sounds are normal. He exhibits no distension. There is no tenderness.  Musculoskeletal: Normal range of motion.  Neurological: He is alert and oriented to person, place, and time.  Skin: Skin is warm and dry. Capillary refill takes less than 2 seconds.  Psychiatric: He has a normal mood and affect.  Nursing note and vitals reviewed.    ED Treatments / Results  Labs (all labs ordered are listed, but only abnormal results are displayed) Labs Reviewed  CBC WITH DIFFERENTIAL/PLATELET - Abnormal; Notable for the following components:      Result Value   WBC 13.9 (*)    RBC 4.04 (*)    Hemoglobin 11.9 (*)    HCT 35.1 (*)    RDW 16.5 (*)    Platelets 121 (*)    Neutro Abs 10.8 (*)    Monocytes Absolute 1.4 (*)    All other components within normal limits  COMPREHENSIVE METABOLIC PANEL - Abnormal; Notable for the following components:   Chloride 95 (*)    BUN 39 (*)    Creatinine, Ser 2.88 (*)    Total Protein 6.1 (*)    Albumin 2.8 (*)    ALT 13 (*)    GFR calc non Af Amer 19 (*)    GFR calc Af Amer 23 (*)    All other components within normal limits  AMMONIA - Abnormal; Notable for the following  components:   Ammonia <9 (*)    All other components within normal limits  LIPASE, BLOOD  ETHANOL    EKG  EKG Interpretation  Date/Time:  Sunday January 05 2018 09:46:17 EST Ventricular Rate:  90 PR Interval:    QRS Duration: 124 QT Interval:  383 QTC Calculation: 469 R Axis:   -36 Text Interpretation:  Atrial fibrillation incomplete bbb No significant change since last tracing Confirmed by Pattricia Boss [phone]) on 01/05/2018 10:40:04 AM       Radiology Dg Chest 2 View  Result Date: 01/05/2018 CLINICAL DATA:  Vomiting and unable to keep down oral intake. EXAM: CHEST  2 VIEW COMPARISON:  09/20/2017 FINDINGS: Lungs are adequately inflated and otherwise clear. Cardiomediastinal silhouette and remainder of the exam is unchanged. IMPRESSION: No active cardiopulmonary disease. Electronically Signed   By: Marin Olp M.D.   On: 01/05/2018 10:23   Dg Abdomen 1 View  Result Date: 01/05/2018 CLINICAL DATA:  Vomiting and unable to keep down oral intake. EXAM: ABDOMEN - 1 VIEW COMPARISON:  10/01/2017 FINDINGS: Air is present throughout the colon. There are a few air-filled small bowel loops with 1 small bowel loop at the upper limits of normal in diameter measuring 3 cm over the left mid abdomen. No definite free peritoneal air. No mass or mass effect. Moderate degenerate change of the spine. Bilateral hip arthroplasties unchanged. IMPRESSION: Nonspecific, nonobstructive bowel gas pattern with a single small bowel loop at the upper limits of normal in diameter over the right mid abdomen measuring 3 cm. Findings may be due to viral gastroenteritis or focal ileus. Electronically Signed   By: Marin Olp M.D.   On: 01/05/2018 10:27    Procedures Procedures (including critical care  time)  Medications Ordered in ED Medications - No data to display   Initial Impression / Assessment and Plan / ED Course  I have reviewed the triage vital signs and the nursing notes.  Pertinent labs &  imaging results that were available during my care of the patient were reviewed by me and considered in my medical decision making (see chart for details).     77 year old man history of esophageal stricture with recent dilation presents today unable to swallow.  He states everything he has been taking again for the past week although he is able to get down some liquids.  Review of records reveal recent esophageal dilatation on February 4 by Dr. Gala Romney.  Clinically this appears to be esophageal obstruction again.  Care discussed with Dr. Arnoldo Morale on-call for Dr. Gala Romney.  However as this does not appear to be an acute GI emergency, plan IV hydration and admission to hospitalist with in-house consult to GI. 1-medical esophageal obstruction 2 acute kidney injury with creatinine elevated to 2.88 with the last being 1.53.  Suspect this is due to volume depletion as patient has had poor p.o. intake secondary to #1 3 leukocytosis noted.  He has not been febrile but is complaining of some cough.  Evidence of acute infiltrate is noted on chest x-Mannix Kroeker Discussed with Dr. Carles Collet and he will see for admission Final Clinical Impressions(s) / ED Diagnoses   Final diagnoses:  Vomiting without nausea, intractability of vomiting not specified, unspecified vomiting type  AKI (acute kidney injury) [facility])    ED Discharge Orders    None       Pattricia Boss, MD 01/05/18 1240

## 2018-01-05 NOTE — ED Notes (Signed)
Report to Shannon, RN  

## 2018-01-05 NOTE — ED Triage Notes (Signed)
Pt reports he has not been able to eat in a week, cannot keep fluids down. Has had 3 episodes of V/ today, denies D/. Scant urinary output. No OTC medications at home. Received approx 450 NS with EMS. BP 96/66.

## 2018-01-05 NOTE — H&P (Signed)
History and Physical  MAURY GRONINGER FYB:[id number] DOB: 10-Dec-1940 DOA: 01/05/2018   PCP: Monico Blitz, MD   Patient coming from: Home  Chief Complaint: vomiting  HPI:  Justin Parsons is a 77 y.o. male with medical history of NASH +/- Etoh cirrhosis, esophageal stricture, pyloric stenosis, chronic atrial fibrillation, CKD stage III, coronary artery disease presenting with 1 week history of intractable nausea and vomiting.  The patient states that his symptoms progressed over the past week worsening over the past 1-2 days.  The patient has not been able to keep down his pills.  He has been having solid food dysphagia.  He is able to drink liquids, but intermittently has problems with vomiting liquids as well.  He denies any fevers, chills, headache, neck pain, chest pain, shortness breath, dysuria, hematuria, rashes.  The patient states that the last time he was able to keep down his pills was on 01/03/2018.  He denies any new medications or alcohol.  Patient underwent EGD on 12/24/2017 which revealed denuded his distal esophageal mucosa, portal hypertensive gastropathy, grade 2 mid esophageal varix.  Pathology was positive for ulcerations without malignancy.  In addition, 10/18/2017 EGD showed esophageal stricture which was dilated, moderate gastritis, pyloric stenosis.  Reglan was added at that time. In the emergency department, the patient was afebrile hemodynamically stable saturating 96% on room air.  Chest x-ray was negative for acute findings.  BMP showed a serum creatinine 2.88 which is above his normal baseline.  WBC was 13.9.  LFTs and lipase were unremarkable.  Assessment/Plan: Intractable nausea and vomiting -Likely secondary to esophageal stricture/pyloric stenosis -Clear liquid diet -IV PPI -GI consult -LFTs and lipase normal  Esophageal stricture/pyloric stenosis -As discussed above 2 5019 EGD-- denuded his distal esophageal mucosa, portal hypertensive gastropathy,  grade 2 mid esophageal varix. -Continue Reglan  Acute on chronic renal failure--CKD stage III -Secondary to volume depletion -Continue IV fluids -Baseline creatinine 1.5-1.8 -Presenting creatinine 2.88 Holding furosemide-  Chronic atrial fibrillation -The patient is a poor candidate for anticoagulation secondary to portal hypertensive gastropathy and liver cirrhosis -Continue aspirin and Plavix -Rate controlled -Continue carvedilol  NASH/Etoh Cirrohsis -Holding furosemide  Coronary artery disease -No chest pain -Holding carvedilol due to soft BP  Hypothyroidism -Continue Synthroid  Hyperlipidemia -Continue statin  Thrombocytopenia -Chronic secondary liver cirrhosis -am CBC     Past Medical History:  Diagnosis Date  . Adenomatous polyp of colon   . Alcoholism ([place])    [place]  . Arthritis   . Atrial fibrillation ([place])   . Blindness of right eye 1997  . CAD (coronary artery disease)    Multivessel disease at cardiac catheterization June 2018, DES to LAD June 2018 with significant residual disease including diagonal and obtuse marginal vessels as well as RCA  . Cirrhosis of liver (HCC)    Associated portal gastropathy  . CKD (chronic kidney disease) stage 3, GFR 30-59 ml/min (HCC)   . Essential hypertension   . Hiatal hernia   . Hypothyroidism   . 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 TIA (transient ischemic attack)    Past Surgical History:  Procedure Laterality Date  . BIOPSY  12/23/2017   Procedure: BIOPSY;  Surgeon: Daneil Dolin, MD;  [facility];  Service: Endoscopy;;  esophagus  . CARDIAC CATHETERIZATION  2003  . COLONOSCOPY  08/2008   normal, repeat exam in 5-7 years  . COLONOSCOPY  2004   rectal adenomatous polyp  . COLONOSCOPY N/A 12/01/2014  CWC:BJSEGB rectum/elongated colon  . CORONARY ATHERECTOMY N/A 05/03/2017   Procedure: Coronary Atherectomy;  Surgeon: Leonie Man, MD;  Location: [facility];  Service:  Cardiovascular;  Laterality: N/A;  . CORONARY STENT INTERVENTION N/A 05/03/2017   Procedure: Coronary Stent Intervention;  Surgeon: Leonie Man, MD;  Location: [facility];  Service: Cardiovascular;  Laterality: N/A;  . ESOPHAGEAL DILATION N/A 02/07/2015   Procedure: ESOPHAGEAL DILATION;  Surgeon: Daneil Dolin, MD;  [facility];  Service: Endoscopy;  Laterality: N/A;  . ESOPHAGEAL DILATION N/A 08/17/2017   Procedure: ESOPHAGEAL DILATION;  Surgeon: Danie Binder, MD;  [facility];  Service: Endoscopy;  Laterality: N/A;  . ESOPHAGEAL DILATION N/A 09/23/2017   Procedure: ESOPHAGEAL DILATION;  Surgeon: Danie Binder, MD;  [facility];  Service: Endoscopy;  Laterality: N/A;  . ESOPHAGOGASTRODUODENOSCOPY  02/11/2012   Dr. Gala Romney: portal gastropathy, gastric erosions, esophageal ulcerations likely pill-induced, surveillance in 2 years  . ESOPHAGOGASTRODUODENOSCOPY N/A 12/01/2014   Dr. Volney American esophageal stricture dilated with the scope passage, portal gastropathy, negative H.pylori on gastric biopsies, esophageal biopsies benign  . ESOPHAGOGASTRODUODENOSCOPY N/A 02/07/2015   Procedure: ESOPHAGOGASTRODUODENOSCOPY (EGD);  Surgeon: Daneil Dolin, MD;  [facility];  Service: Endoscopy;  Laterality: N/A;  1115  . ESOPHAGOGASTRODUODENOSCOPY N/A 08/17/2017   benign-appearing esophageal stenosis s/p dilation, mild gastritis, pylorus stenosis s/p dilation  . ESOPHAGOGASTRODUODENOSCOPY N/A 09/23/2017   moderate benign-appearing instrinsic stenosis s/p dilation, mild gastritis  . ESOPHAGOGASTRODUODENOSCOPY N/A 10/18/2017   Benign-appearing esophageal stricture s/p dilation, gastritis, benign-appearing intrinsice moderate pylorus stenosis s/p dilation  . ESOPHAGOGASTRODUODENOSCOPY (EGD) WITH PROPOFOL N/A 12/23/2017   Procedure: ESOPHAGOGASTRODUODENOSCOPY (EGD) WITH PROPOFOL;  Surgeon: Daneil Dolin, MD;  [facility];  Service: Endoscopy;   Laterality: N/A;  10:45am  . EYE SURGERY     RIGHT EYE REMOVED  . JOINT REPLACEMENT    . LEFT HEART CATH AND CORONARY ANGIOGRAPHY N/A 05/01/2017   Procedure: Left Heart Cath and Coronary Angiography;  Surgeon: Leonie Man, MD;  Location: [facility];  Service: Cardiovascular;  Laterality: N/A;  . Venia Minks DILATION N/A 10/18/2017   Procedure: Venia Minks DILATION;  Surgeon: Danie Binder, MD;  [facility];  Service: Endoscopy;  Laterality: N/A;  . s/p eye implant  1997   artificial eye, right  . SAVORY DILATION N/A 10/18/2017   Procedure: SAVORY DILATION;  Surgeon: Danie Binder, MD;  [facility];  Service: Endoscopy;  Laterality: N/A;  . TOTAL HIP ARTHROPLASTY  2002  . TOTAL HIP ARTHROPLASTY  2006/2012   revision in 2012  . TOTAL KNEE ARTHROPLASTY  1999/2003   left/right   Social History:  reports that  has never smoked. he has never used smokeless tobacco. He reports that he does not drink alcohol or use drugs.   Family History  Problem Relation Age of Onset  . Ovarian cancer Sister   . Colon cancer Neg Hx      No Known Allergies   Prior to Admission medications   Medication Sig Start Date End Date Taking? Authorizing Provider  allopurinol (ZYLOPRIM) 100 MG tablet Take 1 tablet (100 mg total) by mouth daily. Patient taking differently: Take 300 mg by mouth daily.  08/19/17  Yes TatShanon Brow, MD  aspirin 81 MG chewable tablet Chew 81 mg by mouth daily.   Yes [provider]  atorvastatin (LIPITOR) 80 MG tablet Take 80 mg by mouth daily at  6 PM.  09/12/17  Yes [provider]  baclofen (LIORESAL) 10 MG tablet Take 1 tablet by mouth 2 (two) times daily. 12/10/17  Yes [provider]  calcium carbonate (TUMS - DOSED IN MG ELEMENTAL CALCIUM) 500 MG chewable tablet Chew 1 tablet by mouth 2 (two) times daily.   Yes [provider]  carvedilol (COREG) 3.125 MG tablet Take 1 tablet by mouth 2 (two) times daily. 12/20/17   Yes [provider]  clopidogrel (PLAVIX) 75 MG tablet Take 1 tablet (75 mg total) by mouth daily. 05/07/17  Yes Barton Dubois, MD  Ergocalciferol (VITAMIN D2) 400 units TABS Take 1 tablet by mouth 2 (two) times daily.   Yes [provider]  feeding supplement (BOOST / RESOURCE BREEZE) LIQD Take 1 Container by mouth 4 (four) times daily. Patient taking differently: Take 1 Container by mouth daily as needed (does not drink all the time).  08/18/17  Yes Jusiah Aguayo, Shanon Brow, MD  folic acid (FOLVITE) 1 MG tablet Take 1 tablet (1 mg total) by mouth daily. 05/07/17  Yes Barton Dubois, MD  furosemide (LASIX) 40 MG tablet Take 40 mg by mouth daily. 11/14/17  Yes [provider]  levothyroxine (SYNTHROID, LEVOTHROID) 50 MCG tablet Take 50 mcg by mouth daily before breakfast.  09/12/17  Yes [provider]  metoCLOPramide (REGLAN) 5 MG tablet 1 po 30 minutes prior to meals bid Patient taking differently: Take 5 mg by mouth 2 (two) times daily before a meal. 1 po 30 minutes prior to meals bid 10/18/17  Yes Fields, Marga Melnick, MD  NON FORMULARY Apply 1 application topically daily as needed (for pain). [place] Gel as needed for arthritis pain    Yes [provider]  omeprazole (PRILOSEC) 40 MG capsule Take 1 capsule (40 mg total) by mouth 2 (two) times daily. Patient taking differently: Take 20 mg by mouth 2 (two) times daily.  03/12/16  Yes Carlis Stable, NP  polyethylene glycol (MIRALAX / GLYCOLAX) packet Take 17 g daily by mouth. 09/25/17  Yes Kathie Dike, MD  traMADol (ULTRAM) 50 MG tablet Take 1 tablet (50 mg total) by mouth every 6 (six) hours as needed. 08/18/17  Yes Devery Odwyer, Shanon Brow, MD  vitamin C (ASCORBIC ACID) 500 MG tablet Take 500 mg by mouth daily.   Yes [provider]  metoprolol tartrate (LOPRESSOR) 25 MG tablet Take 0.5 tablets (12.5 mg total) by mouth 2 (two) times daily. 08/18/17   Orson Eva, MD    Review of Systems:  Constitutional:  No weight loss, night  sweats, Fevers, chills, fatigue.  Head&Eyes: No headache.  No vision loss.  No eye pain or scotoma ENT:  No Difficulty swallowing,Tooth/dental problems,Sore throat,  No ear ache, post nasal drip,  Cardio-vascular:  No chest pain, Orthopnea, PND, swelling in lower extremities,  dizziness, palpitations  GI:  No  abdominal pain diarrhea, loss of appetite, hematochezia, melena, heartburn, indigestion, Resp:  No shortness of breath with exertion or at rest. No cough. No coughing up of blood .No wheezing.No chest wall deformity  Skin:  no rash or lesions.  GU:  no dysuria, change in color of urine, no urgency or frequency. No flank pain.  Musculoskeletal:  No joint pain or swelling. No decreased range of motion. No back pain.  Psych:  No change in mood or affect. No depression or anxiety. Neurologic: No headache, no dysesthesia, no focal weakness, no vision loss. No syncope  Physical Exam: Vitals:   01/05/18 1030 01/05/18 1100  01/05/18 1130 01/05/18 1200  BP: 108/86 106/87 112/83 109/80  Pulse: 79 87 (!) 102 92  Resp: 13 15 20 18   Temp:      TempSrc:      SpO2: 96% 97% 93% 96%  Weight:      Height:       General:  A&O x 3, NAD, nontoxic, pleasant/cooperative Head/Eye: No conjunctival hemorrhage, no icterus, [place]/AT, No nystagmus ENT:  No icterus,  No thrush, good dentition, no pharyngeal exudate Neck:  No masses, no lymphadenpathy, no bruits CV:  RRR, no rub, no gallop, no S3 Lung:  CTAB, good air movement, no wheeze, no rhonchi Abdomen: soft/NT, +BS, nondistended, no peritoneal signs Ext: No cyanosis, No rashes, No petechiae, No lymphangitis, No edema Neuro: CNII-XII intact, strength 4/5 in bilateral upper and lower extremities, no dysmetria  Labs on Admission:  Basic Metabolic Panel: Recent Labs  Lab 01/05/18 0954  NA 135  K 3.7  CL 95*  CO2 27  GLUCOSE 93  BUN 39*  CREATININE 2.88*  CALCIUM 8.9   Liver Function Tests: Recent Labs  Lab 01/05/18 0954  AST 19    ALT 13*  ALKPHOS 94  BILITOT 0.6  PROT 6.1*  ALBUMIN 2.8*   Recent Labs  Lab 01/05/18 0954  LIPASE 27   Recent Labs  Lab 01/05/18 0954  AMMONIA <9*   CBC: Recent Labs  Lab 01/05/18 0954  WBC 13.9*  NEUTROABS 10.8*  HGB 11.9*  HCT 35.1*  MCV 86.9  PLT 121*   Coagulation Profile: No results for input(s): INR, PROTIME in the last 168 hours. Cardiac Enzymes: No results for input(s): CKTOTAL, CKMB, CKMBINDEX, TROPONINI in the last 168 hours. BNP: Invalid input(s): POCBNP CBG: No results for input(s): GLUCAP in the last 168 hours. Urine analysis:    Component Value Date/Time   COLORURINE YELLOW 09/20/2017 2024   APPEARANCEUR HAZY (A) 09/20/2017 2024   LABSPEC 1.016 09/20/2017 2024   PHURINE 5.0 09/20/2017 2024   GLUCOSEU NEGATIVE 09/20/2017 2024   HGBUR NEGATIVE 09/20/2017 2024   BILIRUBINUR NEGATIVE 09/20/2017 2024   KETONESUR 5 (A) 09/20/2017 2024   PROTEINUR 30 (A) 09/20/2017 2024   UROBILINOGEN 0.2 11/12/2013 2315   NITRITE NEGATIVE 09/20/2017 2024   LEUKOCYTESUR LARGE (A) 09/20/2017 2024   Sepsis Labs: @LABRCNTIP (procalcitonin:4,lacticidven:4) )No results found for this or any previous visit (from the past 240 hour(s)).   Radiological Exams on Admission: Dg Chest 2 View  Result Date: 01/05/2018 CLINICAL DATA:  Vomiting and unable to keep down oral intake. EXAM: CHEST  2 VIEW COMPARISON:  09/20/2017 FINDINGS: Lungs are adequately inflated and otherwise clear. Cardiomediastinal silhouette and remainder of the exam is unchanged. IMPRESSION: No active cardiopulmonary disease. Electronically Signed   By: Marin Olp M.D.   On: 01/05/2018 10:23   Dg Abdomen 1 View  Result Date: 01/05/2018 CLINICAL DATA:  Vomiting and unable to keep down oral intake. EXAM: ABDOMEN - 1 VIEW COMPARISON:  10/01/2017 FINDINGS: Air is present throughout the colon. There are a few air-filled small bowel loops with 1 small bowel loop at the upper limits of normal in diameter  measuring 3 cm over the left mid abdomen. No definite free peritoneal air. No mass or mass effect. Moderate degenerate change of the spine. Bilateral hip arthroplasties unchanged. IMPRESSION: Nonspecific, nonobstructive bowel gas pattern with a single small bowel loop at the upper limits of normal in diameter over the right mid abdomen measuring 3 cm. Findings may be due to viral gastroenteritis or focal  ileus. Electronically Signed   By: Marin Olp M.D.   On: 01/05/2018 10:27    EKG: Independently reviewed.  Atrial fibrillation, nonspecific T wave change.  IVCD    Time spent:60 minutes Code Status:   FULL Family Communication:  No Family at bedside Disposition Plan: expect 2-3day hospitalization Consults called: GI--rourk DVT Prophylaxis: [place] Heparin     Orson Eva, DO  [facility] Pager [phone]  If 7PM-7AM, please contact night-coverage [email or web address] Password [email or web address] 01/05/2018, 12:57 PM

## 2018-01-06 ENCOUNTER — Encounter (HOSPITAL_COMMUNITY): Payer: Self-pay | Admitting: Gastroenterology

## 2018-01-06 DIAGNOSIS — R112 Nausea with vomiting, unspecified: Secondary | ICD-10-CM

## 2018-01-06 DIAGNOSIS — E038 Other specified hypothyroidism: Secondary | ICD-10-CM

## 2018-01-06 DIAGNOSIS — K7460 Unspecified cirrhosis of liver: Secondary | ICD-10-CM

## 2018-01-06 LAB — CBC
HEMATOCRIT: 36.4 % — AB (ref 39.0–52.0)
HEMOGLOBIN: 11.7 g/dL — AB (ref 13.0–17.0)
MCH: 28.7 pg (ref 26.0–34.0)
MCHC: 32.1 g/dL (ref 30.0–36.0)
MCV: 89.4 fL (ref 78.0–100.0)
Platelets: 136 10*3/uL — ABNORMAL LOW (ref 150–400)
RBC: 4.07 MIL/uL — AB (ref 4.22–5.81)
RDW: 17.1 % — ABNORMAL HIGH (ref 11.5–15.5)
WBC: 11.4 10*3/uL — ABNORMAL HIGH (ref 4.0–10.5)

## 2018-01-06 LAB — BASIC METABOLIC PANEL
Anion gap: 12 (ref 5–15)
BUN: 33 mg/dL — AB (ref 6–20)
CO2: 27 mmol/L (ref 22–32)
Calcium: 8.7 mg/dL — ABNORMAL LOW (ref 8.9–10.3)
Chloride: 99 mmol/L — ABNORMAL LOW (ref 101–111)
Creatinine, Ser: 2.25 mg/dL — ABNORMAL HIGH (ref 0.61–1.24)
GFR calc Af Amer: 30 mL/min — ABNORMAL LOW (ref >60)
GFR, EST NON AFRICAN AMERICAN: 26 mL/min — AB (ref >60)
Glucose, Bld: 78 mg/dL (ref 65–99)
Potassium: 3.8 mmol/L (ref 3.5–5.1)
SODIUM: 138 mmol/L (ref 135–145)

## 2018-01-06 MED ORDER — METOCLOPRAMIDE HCL 5 MG/ML IJ SOLN
5.0000 mg | Freq: Three times a day (TID) | INTRAMUSCULAR | Status: DC
  Administered 2018-01-06 – 2018-01-13 (×20): 5 mg via INTRAVENOUS
  Filled 2018-01-06 (×20): qty 2

## 2018-01-06 MED ORDER — ONDANSETRON HCL 4 MG/2ML IJ SOLN
4.0000 mg | Freq: Three times a day (TID) | INTRAMUSCULAR | Status: DC
Start: 2018-01-06 — End: 2018-01-15
  Administered 2018-01-06 – 2018-01-15 (×24): 4 mg via INTRAVENOUS
  Filled 2018-01-06 (×25): qty 2

## 2018-01-06 MED ORDER — PANTOPRAZOLE SODIUM 40 MG IV SOLR
40.0000 mg | Freq: Two times a day (BID) | INTRAVENOUS | Status: DC
  Administered 2018-01-06 – 2018-01-07 (×2): 40 mg via INTRAVENOUS
  Filled 2018-01-06 (×2): qty 40

## 2018-01-06 MED ORDER — ZOLPIDEM TARTRATE 5 MG PO TABS
5.0000 mg | ORAL_TABLET | Freq: Every evening | ORAL | Status: DC | PRN
Start: 2018-01-06 — End: 2018-01-15
  Administered 2018-01-06 – 2018-01-14 (×9): 5 mg via ORAL
  Filled 2018-01-06 (×9): qty 1

## 2018-01-06 NOTE — Progress Notes (Addendum)
PROGRESS NOTE  Justin Parsons KZS:[id number] DOB: 1941/01/31 DOA: 01/05/2018 PCP: Monico Blitz, MD  Brief History:  77 y.o. male with medical history of NASH +/- Etoh cirrhosis, esophageal stricture, pyloric stenosis, chronic atrial fibrillation, CKD stage III, coronary artery disease presenting with 1 week history of intractable nausea and vomiting.  The patient states that his symptoms progressed over the past week worsening over the past 1-2 days.  The patient has not been able to keep down his pills.  He has been having primarily solid food dysphagia.  He is able to drink liquids, but intermittently has problems with vomiting liquids as well.  He denies any fevers, chills, headache, neck pain, chest pain, shortness breath, dysuria, hematuria, rashes.  The patient states that the last time he was able to keep down his pills was on 01/03/2018. Patient underwent EGD on 12/24/2017 which revealed denuded his distal esophageal mucosa, portal hypertensive gastropathy, grade 2 mid esophageal varix.  Pathology was positive for ulcerations without malignancy.  In addition, 10/18/2017 EGD showed esophageal stricture which was dilated, moderate gastritis, pyloric stenosis.  Reglan was added at that time. In the emergency department, the patient was afebrile hemodynamically stable saturating 96% on room air.  Chest x-ray was negative for acute findings.  BMP showed a serum creatinine 2.88 which is above his normal baseline.  WBC was 13.9.  LFTs and lipase were unremarkable.  The patient was started on intravenous fluids and IV PPI.  GI was consulted to assist with management.    Assessment/Plan: Intractable nausea and vomiting -Likely secondary to esophageal stricture/pyloric stenosis vs esophagitis vs gastroparesis -Clear liquid diet -IV PPI -GI consult -LFTs and lipase normal -continue IVF  Esophageal stricture/pyloric stenosis -As discussed above 2 5019 EGD-- denuded mucosa to distal  esophageal mucosa, portal hypertensive gastropathy, grade 2 mid esophageal varix. -Continue Reglan -continue PPI  Acute on chronic renal failure--CKD stage III -Secondary to volume depletion -Continue IV fluids -Baseline creatinine 1.5-1.8 -Presenting creatinine 2.88 -Holding furosemide  Chronic atrial fibrillation -The patient is a poor candidate for anticoagulation secondary to portal hypertensive gastropathy and liver cirrhosis -Continue aspirin and Plavix -Rate controlled -Continue carvedilol  NASH/Etoh Cirrohsis -Holding furosemide due to renal failure  Coronary artery disease -No chest pain -Holding carvedilol due to soft BP  Hypothyroidism -Continue Synthroid  Hyperlipidemia -Continue statin  Thrombocytopenia -Chronic secondary liver cirrhosis -am CBC -monitor for bleeding   Disposition Plan:   Home in 1-2 days  Family Communication:  No Family at bedside  Consultants:  GI--rourk  Code Status:  FULL  DVT Prophylaxis:  [place] Heparin    Procedures: As Listed in Progress Note Above  Antibiotics: None    Subjective: The patient has not had any further vomiting since admission.  He is tolerating clear liquids.  He denies any headache, chest pain, shortness breath, nausea,, vomiting, diarrhea, abdominal pain.  There is no dysuria or hematuria.  Objective: Vitals:   01/05/18 1534 01/05/18 2204 01/06/18 0500 01/06/18 0528  BP: (!) 92/53 103/62 95/61 107/79  Pulse: 82 82 92 92  Resp: 18 18 18 18   Temp: 97.9 F (36.6 C) 97.9 F (36.6 C) (!) 97.5 F (36.4 C) 97.8 F (36.6 C)  TempSrc: Oral Oral Oral Oral  SpO2: 99% 99% 95% 95%  Weight: 98.4 kg (216 lb 14.4 oz)     Height: 5' 9"  (1.753 m)       Intake/Output Summary (Last 24 hours) at 01/06/2018 7322  Last data filed at 01/06/2018 0539 Gross per 24 hour  Intake 1668.75 ml  Output 850 ml  Net 818.75 ml   Weight change:  Exam:   General:  Pt is alert, follows commands appropriately, not  in acute distress  HEENT: No icterus, No thrush, No neck mass, [place]/AT  Cardiovascular: IRRR, S1/S2, no rubs, no gallops  Respiratory: Bibasilar rales.  No wheezing.  Good air movement.  Abdomen: Soft/+BS, non tender, non distended, no guarding  Extremities: No edema, No lymphangitis, No petechiae, No rashes, no synovitis   Data Reviewed: I have personally reviewed following labs and imaging studies Basic Metabolic Panel: Recent Labs  Lab 01/05/18 0954  NA 135  K 3.7  CL 95*  CO2 27  GLUCOSE 93  BUN 39*  CREATININE 2.88*  CALCIUM 8.9   Liver Function Tests: Recent Labs  Lab 01/05/18 0954  AST 19  ALT 13*  ALKPHOS 94  BILITOT 0.6  PROT 6.1*  ALBUMIN 2.8*   Recent Labs  Lab 01/05/18 0954  LIPASE 27   Recent Labs  Lab 01/05/18 0954  AMMONIA <9*   Coagulation Profile: No results for input(s): INR, PROTIME in the last 168 hours. CBC: Recent Labs  Lab 01/05/18 0954 01/06/18 0604  WBC 13.9* 11.4*  NEUTROABS 10.8*  --   HGB 11.9* 11.7*  HCT 35.1* 36.4*  MCV 86.9 89.4  PLT 121* 136*   Cardiac Enzymes: No results for input(s): CKTOTAL, CKMB, CKMBINDEX, TROPONINI in the last 168 hours. BNP: Invalid input(s): POCBNP CBG: No results for input(s): GLUCAP in the last 168 hours. HbA1C: No results for input(s): HGBA1C in the last 72 hours. Urine analysis:    Component Value Date/Time   COLORURINE YELLOW 01/05/2018 [place] 01/05/2018 1433   LABSPEC 1.012 01/05/2018 1433   PHURINE 5.0 01/05/2018 1433   GLUCOSEU NEGATIVE 01/05/2018 1433   HGBUR NEGATIVE 01/05/2018 1433   BILIRUBINUR NEGATIVE 01/05/2018 1433   KETONESUR NEGATIVE 01/05/2018 1433   PROTEINUR 30 (A) 01/05/2018 1433   UROBILINOGEN 0.2 11/12/2013 2315   NITRITE NEGATIVE 01/05/2018 1433   LEUKOCYTESUR TRACE (A) 01/05/2018 1433   Sepsis Labs: @LABRCNTIP (procalcitonin:4,lacticidven:4) )No results found for this or any previous visit (from the past 240 hour(s)).   Scheduled  Meds: . allopurinol  100 mg Oral Daily  . aspirin  81 mg Oral Daily  . atorvastatin  80 mg Oral q1800  . cholecalciferol  400 Units Oral BID  . clopidogrel  75 mg Oral Daily  . feeding supplement  237 mL Oral QID  . folic acid  1 mg Oral Daily  . heparin  5,000 Units Subcutaneous Q8H  . levothyroxine  25 mcg Intravenous QAC breakfast  . metoCLOPramide (REGLAN) injection  5 mg Intravenous BID AC  . pantoprazole (PROTONIX) IV  40 mg Intravenous Q24H   Continuous Infusions: . 0.9 % NaCl with KCl 20 mEq / L 75 mL/hr at 01/06/18 2094    Procedures/Studies: Dg Chest 2 View  Result Date: 01/05/2018 CLINICAL DATA:  Vomiting and unable to keep down oral intake. EXAM: CHEST  2 VIEW COMPARISON:  09/20/2017 FINDINGS: Lungs are adequately inflated and otherwise clear. Cardiomediastinal silhouette and remainder of the exam is unchanged. IMPRESSION: No active cardiopulmonary disease. Electronically Signed   By: Marin Olp M.D.   On: 01/05/2018 10:23   Dg Abdomen 1 View  Result Date: 01/05/2018 CLINICAL DATA:  Vomiting and unable to keep down oral intake. EXAM: ABDOMEN - 1 VIEW COMPARISON:  10/01/2017 FINDINGS:  Air is present throughout the colon. There are a few air-filled small bowel loops with 1 small bowel loop at the upper limits of normal in diameter measuring 3 cm over the left mid abdomen. No definite free peritoneal air. No mass or mass effect. Moderate degenerate change of the spine. Bilateral hip arthroplasties unchanged. IMPRESSION: Nonspecific, nonobstructive bowel gas pattern with a single small bowel loop at the upper limits of normal in diameter over the right mid abdomen measuring 3 cm. Findings may be due to viral gastroenteritis or focal ileus. Electronically Signed   By: Marin Olp M.D.   On: 01/05/2018 10:27    Orson Eva, DO  [facility] Pager [phone]  If 7PM-7AM, please contact night-coverage [email or web address] Password [email or web address] 01/06/2018, 8:11 AM   LOS: 1 day

## 2018-01-06 NOTE — Consult Note (Addendum)
Referring Provider: Dr. Carles Collet Primary Care Physician:  Monico Blitz, MD Primary Gastroenterologist:  Dr. Gala Romney   Date of Admission: 01/05/18 Date of Consultation: 01/06/18  Reason for Consultation:  Dysphagia, vomiting   HPI:  Justin Parsons is a 77 y.o. 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 He has had multiple prior EGDs due to dysphagia in the past, with findings of esophageal stricture, pyloric stenosis. History of delayed gastric emptying, documented in Sept 2018. He recently underwent EGD on 12/23/17 with single short Grade 2 varix mid esophagus, just above GE junction was nearly circumferential denuding ulcerations/loss of normal mucosal appearance (somewhat punched out) mucosa. Query pill induced injury versus other process. Portal hypertensive gastropathy, patent pylorus. Normal duodenal bulb and second portion of duodenum. No dilation. Path negative for malignancy. Recommended repeat EGD in 3 months.   He states that he did well following the upper endoscopy, able to tolerate eating without any difficulty. Difficult historian. States he had worsening nausea and vomiting but denied dysphagia, stating he could not even "get the food in my mouth" due to nausea. He reports bringing food up to his mouth to eat but vomiting due to nausea. I asked him to clarify several times, and it appears that his main issues are nausea and vomiting: unclear if he is having recurrent dysphagia. He is unable to tell me if he has been taking the PPI BID. Notes upper abdominal pain. States the caretaker that helps him has been sick with N/V recently. No diarrhea. No constipation. No confusion or mental status changes.   Abdominal xray yesterday with non-specific findings, possible viral gastroenteritis or focal ileus. +flatus noted. Ate broth yesterday evening without difficulty.   Past Medical History:  Diagnosis Date  . Adenomatous polyp of colon   . 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 Arthritis   . Atrial fibrillation ([place])   . Blindness of right eye 1997  . CAD (coronary artery disease)    Multivessel disease at cardiac catheterization June 2018, DES to LAD June 2018 with significant residual disease including diagonal and obtuse marginal vessels as well as RCA  . Cirrhosis of liver (HCC)    Associated portal gastropathy  . CKD (chronic kidney disease) stage 3, GFR 30-59 ml/min (HCC)   . Essential hypertension   . Hiatal hernia   . Hypothyroidism   . 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 TIA (transient ischemic attack)     Past Surgical History:  Procedure Laterality Date  . BIOPSY  12/23/2017   Procedure: BIOPSY;  Surgeon: Daneil Dolin, MD;  [facility];  Service: Endoscopy;;  esophagus  . CARDIAC CATHETERIZATION  2003  . COLONOSCOPY  08/2008   normal, repeat exam in 5-7 years  . COLONOSCOPY  2004   rectal adenomatous polyp  . COLONOSCOPY N/A 12/01/2014   FWY:OVZCHY rectum/elongated colon  . CORONARY ATHERECTOMY N/A 05/03/2017   Procedure: Coronary Atherectomy;  Surgeon: Leonie Man, MD;  Location: [facility];  Service: Cardiovascular;  Laterality: N/A;  . CORONARY STENT INTERVENTION N/A 05/03/2017   Procedure: Coronary Stent Intervention;  Surgeon: Leonie Man, MD;  Location: [facility];  Service: Cardiovascular;  Laterality: N/A;  . ESOPHAGEAL DILATION N/A 02/07/2015   Procedure: ESOPHAGEAL DILATION;  Surgeon: Daneil Dolin, MD;  [facility];  Service: Endoscopy;  Laterality: N/A;  . ESOPHAGEAL DILATION N/A 08/17/2017   Procedure: ESOPHAGEAL DILATION;  Surgeon: Danie Binder, MD;  [facility];  Service: Endoscopy;  Laterality: N/A;  . ESOPHAGEAL DILATION N/A 09/23/2017   Procedure: ESOPHAGEAL DILATION;  Surgeon: Danie Binder, MD;  [facility];  Service: Endoscopy;  Laterality: N/A;  . ESOPHAGOGASTRODUODENOSCOPY  02/11/2012   Dr. Gala Romney: portal gastropathy, gastric  erosions, esophageal ulcerations likely pill-induced, surveillance in 2 years  . ESOPHAGOGASTRODUODENOSCOPY N/A 12/01/2014   Dr. Volney American esophageal stricture dilated with the scope passage, portal gastropathy, negative H.pylori on gastric biopsies, esophageal biopsies benign  . ESOPHAGOGASTRODUODENOSCOPY N/A 02/07/2015   Procedure: ESOPHAGOGASTRODUODENOSCOPY (EGD);  Surgeon: Daneil Dolin, MD;  [facility];  Service: Endoscopy;  Laterality: N/A;  1115  . ESOPHAGOGASTRODUODENOSCOPY N/A 08/17/2017   benign-appearing esophageal stenosis s/p dilation, mild gastritis, pylorus stenosis s/p dilation  . ESOPHAGOGASTRODUODENOSCOPY N/A 09/23/2017   moderate benign-appearing instrinsic stenosis s/p dilation, mild gastritis  . ESOPHAGOGASTRODUODENOSCOPY N/A 10/18/2017   Benign-appearing esophageal stricture s/p dilation, gastritis, benign-appearing intrinsice moderate pylorus stenosis s/p dilation  . ESOPHAGOGASTRODUODENOSCOPY (EGD) WITH PROPOFOL N/A 12/23/2017   Dr. Gala Romney: single short Grade 2 varix mid esophagus, just above GE junction was nearly circumferential denuding ulcerations/loss of normal mucosal appearance (somewhat punched out) mucosa. Query pill induced injury with path negative for malignancy. Portal hypertensive gastropathy, patent pylorus, normal duodenal bulb and second portion of duodenum, no dilation performed.   Marland Kitchen EYE SURGERY     RIGHT EYE REMOVED  . JOINT REPLACEMENT    . LEFT HEART CATH AND CORONARY ANGIOGRAPHY N/A 05/01/2017   Procedure: Left Heart Cath and Coronary Angiography;  Surgeon: Leonie Man, MD;  Location: [place] [facility];  Service: Cardiovascular;  Laterality: N/A;  . Venia Minks DILATION N/A 10/18/2017   Procedure: Venia Minks DILATION;  Surgeon: Danie Binder, MD;  [facility];  Service: Endoscopy;  Laterality: N/A;  . s/p eye implant  1997   artificial eye, right  . SAVORY DILATION N/A 10/18/2017   Procedure: SAVORY DILATION;  Surgeon: Danie Binder, MD;  [facility];  Service: Endoscopy;  Laterality: N/A;  . TOTAL HIP ARTHROPLASTY  2002  . TOTAL HIP ARTHROPLASTY  2006/2012   revision in 2012  . TOTAL KNEE ARTHROPLASTY  1999/2003   left/right    Prior to Admission medications   Medication Sig Start Date End Date Taking? Authorizing Provider  allopurinol (ZYLOPRIM) 100 MG tablet Take 1 tablet (100 mg total) by mouth daily. Patient taking differently: Take 300 mg by mouth daily.  08/19/17  Yes TatShanon Brow, MD  aspirin 81 MG chewable tablet Chew 81 mg by mouth daily.   Yes [provider]  atorvastatin (LIPITOR) 80 MG tablet Take 80 mg by mouth daily at 6 PM.  09/12/17  Yes [provider]  baclofen (LIORESAL) 10 MG tablet Take 1 tablet by mouth 2 (two) times daily. 12/10/17  Yes [provider]  calcium carbonate (TUMS - DOSED IN MG ELEMENTAL CALCIUM) 500 MG chewable tablet Chew 1 tablet by mouth 2 (two) times daily.   Yes [provider]  carvedilol (COREG) 3.125 MG tablet Take 1 tablet by mouth 2 (two) times daily. 12/20/17  Yes [provider]  clopidogrel (PLAVIX) 75 MG tablet Take 1 tablet (75 mg total) by mouth daily. 05/07/17  Yes Barton Dubois, MD  Ergocalciferol (VITAMIN D2) 400 units TABS Take 1 tablet by mouth 2 (two) times daily.   Yes [provider]  feeding supplement (BOOST / RESOURCE BREEZE) LIQD Take  1 Container by mouth 4 (four) times daily. Patient taking differently: Take 1 Container by mouth daily as needed (does not drink all the time).  08/18/17  Yes Tat, Shanon Brow, MD  folic acid (FOLVITE) 1 MG tablet Take 1 tablet (1 mg total) by mouth daily. 05/07/17  Yes Barton Dubois, MD  furosemide (LASIX) 40 MG tablet Take 40 mg by mouth daily. 11/14/17  Yes [provider]  levothyroxine (SYNTHROID, LEVOTHROID) 50 MCG tablet Take 50 mcg by mouth daily before breakfast.  09/12/17  Yes [provider]  metoCLOPramide (REGLAN) 5 MG tablet 1 po 30  minutes prior to meals bid Patient taking differently: Take 5 mg by mouth 2 (two) times daily before a meal. 1 po 30 minutes prior to meals bid 10/18/17  Yes Fields, Marga Melnick, MD  NON FORMULARY Apply 1 application topically daily as needed (for pain). [place] Gel as needed for arthritis pain    Yes [provider]  omeprazole (PRILOSEC) 40 MG capsule Take 1 capsule (40 mg total) by mouth 2 (two) times daily. Patient taking differently: Take 20 mg by mouth 2 (two) times daily.  03/12/16  Yes Carlis Stable, NP  polyethylene glycol (MIRALAX / GLYCOLAX) packet Take 17 g daily by mouth. 09/25/17  Yes Kathie Dike, MD  traMADol (ULTRAM) 50 MG tablet Take 1 tablet (50 mg total) by mouth every 6 (six) hours as needed. 08/18/17  Yes Tat, Shanon Brow, MD  vitamin C (ASCORBIC ACID) 500 MG tablet Take 500 mg by mouth daily.   Yes [provider]  metoprolol tartrate (LOPRESSOR) 25 MG tablet Take 0.5 tablets (12.5 mg total) by mouth 2 (two) times daily. 08/18/17   Orson Eva, MD    Current Facility-Administered Medications  Medication Dose Route Frequency Provider Last Rate Last Dose  . 0.9 % NaCl with KCl 20 mEq/ L  infusion   Intravenous Continuous Tat, Shanon Brow, MD 75 mL/hr at 01/06/18 0539    . acetaminophen (TYLENOL) tablet 650 mg  650 mg Oral Q6H PRN Tat, David, MD       Or  . acetaminophen (TYLENOL) suppository 650 mg  650 mg Rectal Q6H PRN Tat, Shanon Brow, MD      . allopurinol (ZYLOPRIM) tablet 100 mg  100 mg Oral Daily Tat, David, MD   100 mg at 01/05/18 1941  . aspirin chewable tablet 81 mg  81 mg Oral Daily Tat, Shanon Brow, MD   81 mg at 01/05/18 1940  . atorvastatin (LIPITOR) tablet 80 mg  80 mg Oral q1800 Orson Eva, MD   80 mg at 01/05/18 1939  . cholecalciferol (VITAMIN D) tablet 400 Units  400 Units Oral BID Orson Eva, MD   400 Units at 01/05/18 2244  . clopidogrel (PLAVIX) tablet 75 mg  75 mg Oral Daily Tat, Shanon Brow, MD   75 mg at 01/05/18 1939  . feeding supplement (BOOST / RESOURCE BREEZE)  liquid 1 Container  237 mL Oral QID Orson Eva, MD   1 Container at 01/05/18 2244  . folic acid (FOLVITE) tablet 1 mg  1 mg Oral Daily Tat, David, MD   1 mg at 01/05/18 1940  . guaiFENesin-dextromethorphan (ROBITUSSIN DM) 100-10 MG/5ML syrup 5 mL  5 mL Oral Q4H PRN Tat, Shanon Brow, MD   5 mL at 01/06/18 0329  . heparin injection 5,000 Units  5,000 Units Subcutaneous Franco Collet, MD   5,000 Units at 01/06/18 0534  . levothyroxine (SYNTHROID, LEVOTHROID) injection 25 mcg  25 mcg Intravenous QAC breakfast  Orson Eva, MD      . metoCLOPramide (REGLAN) injection 5 mg  5 mg Intravenous BID Mina Marble, MD   5 mg at 01/05/18 1941  . ondansetron (ZOFRAN) tablet 4 mg  4 mg Oral Q6H PRN Tat, David, MD       Or  . ondansetron (ZOFRAN) injection 4 mg  4 mg Intravenous Q6H PRN Tat, David, MD      . pantoprazole (PROTONIX) injection 40 mg  40 mg Intravenous Q24H Orson Eva, MD   40 mg at 01/05/18 1941  . traMADol (ULTRAM) tablet 50 mg  50 mg Oral Q6H PRN Orson Eva, MD   50 mg at 01/06/18 0139    Allergies as of 01/05/2018  . (No Known Allergies)    Family History  Problem Relation Age of Onset  . Ovarian cancer Sister   . Colon cancer Neg Hx     Social History   Socioeconomic History  . Marital status: Divorced    Spouse name: Not on file  . Number of children: Not on file  . Years of education: Not on file  . Highest education level: Not on file  Social Needs  . Financial resource strain: Not on file  . Food insecurity - worry: Not on file  . Food insecurity - inability: Not on file  . Transportation needs - medical: Not on file  . Transportation needs - non-medical: Not on file  Occupational History  . Not on file  Tobacco Use  . Smoking status: Never Smoker  . Smokeless tobacco: Never Used  . Tobacco comment: used to chew tobacco, none in 15 years  Substance and Sexual Activity  . Alcohol use: No    Alcohol/week: 0.0 oz    Frequency: Never    Comment: no alcohol at present-in  nursing home; half a gallon moonshine per week  . Drug use: No  . Sexual activity: Not Currently  Other Topics Concern  . Not on file  Social History Narrative   One son deceased while in prison, drug addiction.    Review of Systems: Gen: see HPI  CV: Denies chest pain, heart palpitations, syncope, edema  Resp: Denies shortness of breath with rest, cough, wheezing GI: see HP I GU : Denies urinary burning, urinary frequency, urinary incontinence.  MS: Denies joint pain,swelling, cramping Derm: Denies rash, itching, dry skin Psych: Denies depression, anxiety,confusion, or memory loss Heme: Denies bruising, bleeding, and enlarged lymph nodes.  Physical Exam: Vital signs in last 24 hours: Temp:  [97.5 F (36.4 C)-97.9 F (36.6 C)] 97.8 F (36.6 C) (02/18 0528) Pulse Rate:  [53-102] 92 (02/18 0528) Resp:  [12-20] 18 (02/18 0528) BP: (92-117)/(53-87) 107/79 (02/18 0528) SpO2:  [90 %-99 %] 95 % (02/18 0528) Weight:  [216 lb 14.4 oz (98.4 kg)-230 lb (104.3 kg)] 216 lb 14.4 oz (98.4 kg) (02/17 1534) Last BM Date: 01/04/18 General:   Alert, appear chronically ill, no distress Head:  Normocephalic and atraumatic. Eyes:  Sclera clear, no icterus.    Ears:  Normal auditory acuity. Lungs:  Clear throughout to auscultation.    Heart:  S1 S2 present without obvious murmurs Abdomen:  Soft, obese, +BS, non-distended. Mild TTP upper abdomen but without rebound or guarding Rectal:  Deferred  Msk:  Symmetrical without gross deformities. Normal posture. Extremities:  Without  edema. Neurologic:  Alert and  oriented x4 Psych:  Alert and cooperative. Normal mood and affect.  Intake/Output from previous day: 02/17 0701 - 02/18 0700 In:  1668.8 [P.O.:600; I.V.:568.8; IV Piggyback:500] Out: 850 [Urine:850] Intake/Output this shift: No intake/output data recorded.  Lab Results: Recent Labs    01/05/18 0954 01/06/18 0604  WBC 13.9* 11.4*  HGB 11.9* 11.7*  HCT 35.1* 36.4*  PLT 121* 136*    BMET Recent Labs    01/05/18 0954  NA 135  K 3.7  CL 95*  CO2 27  GLUCOSE 93  BUN 39*  CREATININE 2.88*  CALCIUM 8.9   LFT Recent Labs    01/05/18 0954  PROT 6.1*  ALBUMIN 2.8*  AST 19  ALT 13*  ALKPHOS 94  BILITOT 0.6   Lab Results  Component Value Date   LIPASE 27 01/05/2018     Studies/Results: Dg Chest 2 View  Result Date: 01/05/2018 CLINICAL DATA:  Vomiting and unable to keep down oral intake. EXAM: CHEST  2 VIEW COMPARISON:  09/20/2017 FINDINGS: Lungs are adequately inflated and otherwise clear. Cardiomediastinal silhouette and remainder of the exam is unchanged. IMPRESSION: No active cardiopulmonary disease. Electronically Signed   By: Marin Olp M.D.   On: 01/05/2018 10:23   Dg Abdomen 1 View  Result Date: 01/05/2018 CLINICAL DATA:  Vomiting and unable to keep down oral intake. EXAM: ABDOMEN - 1 VIEW COMPARISON:  10/01/2017 FINDINGS: Air is present throughout the colon. There are a few air-filled small bowel loops with 1 small bowel loop at the upper limits of normal in diameter measuring 3 cm over the left mid abdomen. No definite free peritoneal air. No mass or mass effect. Moderate degenerate change of the spine. Bilateral hip arthroplasties unchanged. IMPRESSION: Nonspecific, nonobstructive bowel gas pattern with a single small bowel loop at the upper limits of normal in diameter over the right mid abdomen measuring 3 cm. Findings may be due to viral gastroenteritis or focal ileus. Electronically Signed   By: Marin Olp M.D.   On: 01/05/2018 10:27    Impression: 77 year old male with history of cirrhosis presumed secondary to NASH/ETOH abuse, history of delayed gastric emptying (GES Sept 2018), multiple prior EGDs with dilatation in setting of pyloric stenosis and esophageal stricture, presenting with acute on chronic N/V. He is a difficult and limited historian, but it is unclear if he had true recurrent dysphagia and mainly reports worsening of  nausea/vomiting. EGD recently completed 12/23/17 with ulcerations just above GE junction with path negative for malignancy. No dilatation completed at that time.   Nausea and vomiting multifactorial in this setting. He does note that his caretaker has been dealing with similar symptoms. Unable to rule out gastroenteritis exacerbating known delayed gastric emptying; however, it is unclear if esophageal dysphagia is playing a role here. He has never had a BPE, and I wonder if he has an underlying motility disorder as well. With EGD fairly recent, would not recommend repeat EGD now unless obvious signs of stricture on imaging. He will need an EGD regardless in 3 months for surveillance. He is unclear regarding medications as outpatient.   Will maximize supportive measures to include increasing Reglan to three times per day instead of BID, increase PPI to BID, add Zofran scheduled, and pursue BPE if any signs of dysphagia while inpatient. He desires clear liquids this morning and did well yesterday evening: will follow closely.   Plan: Increase Protonix to BID Increase Reglan to before meals, TID Add Zofran scheduled Clear liquids Sit upright while eating BPE if evidence of dysphagia  Would want him to avoid Prilosec and utilize a different PPI as outpatient due to concomitant  use of Plavix Would benefit from [facility] Referral if a candidate to help follow as outpatient and ensure appropriate medications at home and compliance  Annitta Needs, PhD, ANP-BC [facility]      LOS: 1 day    01/06/2018, 8:11 AM

## 2018-01-06 NOTE — Plan of Care (Signed)
  Clinical Measurements: Ability to maintain clinical measurements within normal limits will improve 01/06/2018 2234 - Progressing by Cassandria Anger, RN Will remain free from infection 01/06/2018 2234 - Progressing by Cassandria Anger, RN Diagnostic test results will improve 01/06/2018 2234 - Progressing by Cassandria Anger, RN   Activity: Risk for activity intolerance will decrease 01/06/2018 2234 - Progressing by Cassandria Anger, RN   Nutrition: Adequate nutrition will be maintained 01/06/2018 2234 - Progressing by Cassandria Anger, RN   Coping: Level of anxiety will decrease 01/06/2018 2234 - Progressing by Cassandria Anger, RN   Pain Managment: General experience of comfort will improve 01/06/2018 2234 - Progressing by Cassandria Anger, RN   Health Behavior/Discharge Planning: Ability to manage health-related needs will improve 01/06/2018 2234 - Progressing by Cassandria Anger, RN   Fluid Volume: Fluid volume balance will be maintained or improved 01/06/2018 2234 - Progressing by Cassandria Anger, RN

## 2018-01-07 LAB — CBC
HEMATOCRIT: 35 % — AB (ref 39.0–52.0)
HEMOGLOBIN: 11.2 g/dL — AB (ref 13.0–17.0)
MCH: 29 pg (ref 26.0–34.0)
MCHC: 32 g/dL (ref 30.0–36.0)
MCV: 90.7 fL (ref 78.0–100.0)
PLATELETS: 126 10*3/uL — AB (ref 150–400)
RBC: 3.86 MIL/uL — AB (ref 4.22–5.81)
RDW: 17.3 % — ABNORMAL HIGH (ref 11.5–15.5)
WBC: 9.2 10*3/uL (ref 4.0–10.5)

## 2018-01-07 LAB — BASIC METABOLIC PANEL
ANION GAP: 8 (ref 5–15)
BUN: 24 mg/dL — ABNORMAL HIGH (ref 6–20)
CO2: 25 mmol/L (ref 22–32)
Calcium: 8.6 mg/dL — ABNORMAL LOW (ref 8.9–10.3)
Chloride: 103 mmol/L (ref 101–111)
Creatinine, Ser: 1.81 mg/dL — ABNORMAL HIGH (ref 0.61–1.24)
GFR calc non Af Amer: 34 mL/min — ABNORMAL LOW (ref >60)
GFR, EST AFRICAN AMERICAN: 40 mL/min — AB (ref >60)
Glucose, Bld: 83 mg/dL (ref 65–99)
POTASSIUM: 4.3 mmol/L (ref 3.5–5.1)
SODIUM: 136 mmol/L (ref 135–145)

## 2018-01-07 MED ORDER — MAGNESIUM SULFATE 2 GM/50ML IV SOLN
2.0000 g | Freq: Once | INTRAVENOUS | Status: AC
  Administered 2018-01-07: 2 g via INTRAVENOUS
  Filled 2018-01-07: qty 50

## 2018-01-07 MED ORDER — PANTOPRAZOLE SODIUM 40 MG PO TBEC
40.0000 mg | DELAYED_RELEASE_TABLET | Freq: Two times a day (BID) | ORAL | Status: DC
  Administered 2018-01-07 – 2018-01-15 (×15): 40 mg via ORAL
  Filled 2018-01-07 (×16): qty 1

## 2018-01-07 NOTE — Plan of Care (Signed)
  Education: Knowledge of General Education information will improve 01/07/2018 2234 - Progressing by Cassandria Anger, RN   Clinical Measurements: Ability to maintain clinical measurements within normal limits will improve 01/07/2018 2234 - Progressing by Cassandria Anger, RN Diagnostic test results will improve 01/07/2018 2234 - Progressing by Cassandria Anger, RN   Activity: Risk for activity intolerance will decrease 01/07/2018 2234 - Progressing by Cassandria Anger, RN   Clinical Measurements: Diagnostic test results will improve 01/07/2018 2234 - Progressing by Cassandria Anger, RN   Coping: Level of anxiety will decrease 01/07/2018 2234 - Progressing by Cassandria Anger, RN   Pain Managment: General experience of comfort will improve 01/07/2018 2234 - Progressing by Cassandria Anger, RN   Safety: Ability to remain free from injury will improve 01/07/2018 2234 - Progressing by Cassandria Anger, RN

## 2018-01-07 NOTE — Progress Notes (Addendum)
PROGRESS NOTE  Justin Parsons QVZ:[id number] DOB: 04-05-1941 DOA: 01/05/2018 PCP: Monico Blitz, MD Brief History:  77 y.o.malewith medical history ofNASH +/- Etoh cirrhosis,esophageal stricture, pyloric stenosis, chronic atrial fibrillation, CKD stage III, coronary artery disease presenting with 1weekhistory of intractable nausea and vomiting. The patient states that his symptoms progressed over the past week worsening over the past 1-2 days. The patient has not been able to keep down his pills. He has been having primarily solid food dysphagia. He is able to drink liquids, but intermittently has problems with vomiting liquids as well. He denies any fevers, chills, headache, neck pain, chest pain, shortness breath, dysuria, hematuria, rashes. The patient states that the last time he was able to keep down his pills was on 01/03/2018. Patient underwent EGD on 12/24/2017 which revealed denuded his distal esophageal mucosa, portal hypertensive gastropathy, grade 2 mid esophageal varix. Pathology was positive for ulcerations without malignancy. In addition, 10/18/2017 EGD showed esophageal stricture which was dilated, moderate gastritis, pyloric stenosis. Reglan was added at that time. In the emergency department, the patient was afebrile hemodynamically stable saturating 96% on room air. Chest x-ray was negative for acute findings. BMP showed a serum creatinine 2.88 which is above his normal baseline. WBC was 13.9. LFTs and lipase were unremarkable.  The patient was started on intravenous fluids and IV PPI.  GI was consulted to assist with management.    Assessment/Plan: Intractable nausea and vomiting -Likely secondary to esophagitis vs gastroparesis, less likely stricture -Clear liquid diet--pt did not want to advance 01/07/18--"smell of food makes ne nauseous" -re-try soft diet 01/08/18 -PPI bid -GI consult appreciated -LFTs and lipase normal -continue IVF -continue  Reglan and Zofran scheduled  Esophageal stricture/pyloric stenosis -As discussed above 12/24/17 EGD--denuded mucosa to distal esophageal mucosa, portal hypertensive gastropathy, grade 2 mid esophageal varix. -Continue Reglan -continue PPI  Acute on chronic renal failure--CKD stage III -Secondary to volume depletion -Continue IV fluids-->improving -Baseline creatinine 1.5-1.8 -Presenting creatinine 2.88 -Holding furosemide  Chronic atrial fibrillation -The patient is a poor candidate for anticoagulation secondary to portal hypertensive gastropathy and liver cirrhosis -Continue aspirin and Plavix -Rate controlled -Continue carvedilol  NASH/Etoh Cirrohsis -Holding furosemide due to renal failure  Coronary artery disease -No chest pain -Holdingcarvediloldue to soft BP  Hypothyroidism -Continue Synthroid  Hyperlipidemia -Continue statin  Thrombocytopenia -Chronic secondary liver cirrhosis -am CBC -monitor for bleeding   Disposition Plan:   Home when tolerating diet  Family Communication:  No Family at bedside  Consultants:  GI--rourk  Code Status:  FULL  DVT Prophylaxis:  [place] Heparin    Procedures: As Listed in Progress Note Above  Antibiotics: None      Subjective: Patient denies fevers, chills, headache, chest pain, dyspnea, nausea, vomiting, diarrhea, abdominal pain, dysuria, hematuria, hematochezia, and melena.  He stated that the smell of the food made him nauseous today.  He did not want to advance his diet.   Objective: Vitals:   01/06/18 0528 01/06/18 2310 01/07/18 0654 01/07/18 1300  BP: 107/79 110/78 117/68 105/72  Pulse: 92 90 88 88  Resp: 18 18 18 18   Temp: 97.8 F (36.6 C) 98.1 F (36.7 C) 98.4 F (36.9 C)   TempSrc: Oral Oral Oral   SpO2: 95% 95% 94% 99%  Weight:      Height:        Intake/Output Summary (Last 24 hours) at 01/07/2018 1835 Last data filed at 01/07/2018 1800 Gross per 24 hour  Intake 2040 ml    Output 950 ml  Net 1090 ml   Weight change:  Exam:   General:  Pt is alert, follows commands appropriately, not in acute distress  HEENT: No icterus, No thrush, No neck mass, [place]/AT  Cardiovascular: RRR, S1/S2, no rubs, no gallops  Respiratory: Bibasilar rales.  No wheezing.  Good air movement  Abdomen: Soft/+BS, non tender, non distended, no guarding  Extremities: No edema, No lymphangitis, No petechiae, No rashes, no synovitis   Data Reviewed: I have personally reviewed following labs and imaging studies Basic Metabolic Panel: Recent Labs  Lab 01/05/18 0954 01/06/18 0604 01/07/18 0651  NA 135 138 136  K 3.7 3.8 4.3  CL 95* 99* 103  CO2 27 27 25   GLUCOSE 93 78 83  BUN 39* 33* 24*  CREATININE 2.88* 2.25* 1.81*  CALCIUM 8.9 8.7* 8.6*   Liver Function Tests: Recent Labs  Lab 01/05/18 0954  AST 19  ALT 13*  ALKPHOS 94  BILITOT 0.6  PROT 6.1*  ALBUMIN 2.8*   Recent Labs  Lab 01/05/18 0954  LIPASE 27   Recent Labs  Lab 01/05/18 0954  AMMONIA <9*   Coagulation Profile: No results for input(s): INR, PROTIME in the last 168 hours. CBC: Recent Labs  Lab 01/05/18 0954 01/06/18 0604 01/07/18 0651  WBC 13.9* 11.4* 9.2  NEUTROABS 10.8*  --   --   HGB 11.9* 11.7* 11.2*  HCT 35.1* 36.4* 35.0*  MCV 86.9 89.4 90.7  PLT 121* 136* 126*   Cardiac Enzymes: No results for input(s): CKTOTAL, CKMB, CKMBINDEX, TROPONINI in the last 168 hours. BNP: Invalid input(s): POCBNP CBG: No results for input(s): GLUCAP in the last 168 hours. HbA1C: No results for input(s): HGBA1C in the last 72 hours. Urine analysis:    Component Value Date/Time   COLORURINE YELLOW 01/05/2018 [place] 01/05/2018 1433   LABSPEC 1.012 01/05/2018 1433   PHURINE 5.0 01/05/2018 1433   GLUCOSEU NEGATIVE 01/05/2018 1433   HGBUR NEGATIVE 01/05/2018 1433   BILIRUBINUR NEGATIVE 01/05/2018 1433   KETONESUR NEGATIVE 01/05/2018 1433   PROTEINUR 30 (A) 01/05/2018 1433    UROBILINOGEN 0.2 11/12/2013 2315   NITRITE NEGATIVE 01/05/2018 1433   LEUKOCYTESUR TRACE (A) 01/05/2018 1433   Sepsis Labs: @LABRCNTIP (procalcitonin:4,lacticidven:4) )No results found for this or any previous visit (from the past 240 hour(s)).   Scheduled Meds: . allopurinol  100 mg Oral Daily  . aspirin  81 mg Oral Daily  . atorvastatin  80 mg Oral q1800  . cholecalciferol  400 Units Oral BID  . clopidogrel  75 mg Oral Daily  . feeding supplement  237 mL Oral QID  . folic acid  1 mg Oral Daily  . heparin  5,000 Units Subcutaneous Q8H  . levothyroxine  25 mcg Intravenous QAC breakfast  . metoCLOPramide (REGLAN) injection  5 mg Intravenous TID AC  . ondansetron (ZOFRAN) IV  4 mg Intravenous TID AC  . pantoprazole  40 mg Oral BID AC   Continuous Infusions: . 0.9 % NaCl with KCl 20 mEq / L 75 mL/hr at 01/07/18 0350    Procedures/Studies: Dg Chest 2 View  Result Date: 01/05/2018 CLINICAL DATA:  Vomiting and unable to keep down oral intake. EXAM: CHEST  2 VIEW COMPARISON:  09/20/2017 FINDINGS: Lungs are adequately inflated and otherwise clear. Cardiomediastinal silhouette and remainder of the exam is unchanged. IMPRESSION: No active cardiopulmonary disease. Electronically Signed   By: Marin Olp M.D.   On: 01/05/2018 10:23  Dg Abdomen 1 View  Result Date: 01/05/2018 CLINICAL DATA:  Vomiting and unable to keep down oral intake. EXAM: ABDOMEN - 1 VIEW COMPARISON:  10/01/2017 FINDINGS: Air is present throughout the colon. There are a few air-filled small bowel loops with 1 small bowel loop at the upper limits of normal in diameter measuring 3 cm over the left mid abdomen. No definite free peritoneal air. No mass or mass effect. Moderate degenerate change of the spine. Bilateral hip arthroplasties unchanged. IMPRESSION: Nonspecific, nonobstructive bowel gas pattern with a single small bowel loop at the upper limits of normal in diameter over the right mid abdomen measuring 3 cm. Findings  may be due to viral gastroenteritis or focal ileus. Electronically Signed   By: Marin Olp M.D.   On: 01/05/2018 10:27    Orson Eva, DO  [facility] Pager [phone]  If 7PM-7AM, please contact night-coverage [email or web address] Password [facility] 01/07/2018, 6:35 PM   LOS: 2 days

## 2018-01-07 NOTE — Progress Notes (Signed)
Subjective: Tolerating clear liquids. States he didn't vomit yesterday after I saw him. No dysphagia noted with clear liquids, and I verified this with nursing staff. Difficult historian.   Objective: Vital signs in last 24 hours: Temp:  [98.1 F (36.7 C)-98.4 F (36.9 C)] 98.4 F (36.9 C) (02/19 0654) Pulse Rate:  [88-90] 88 (02/19 0654) Resp:  [18] 18 (02/19 0654) BP: (110-117)/(68-78) 117/68 (02/19 0654) SpO2:  [94 %-95 %] 94 % (02/19 0654) Last BM Date: 01/05/18 General:   Alert and oriented, pleasant Head:  Normocephalic and atraumatic. Abdomen:  Bowel sounds present, soft, non-tender, non-distended.  Neurologic:  Alert and  oriented x4 Psych:  Alert and cooperative. Normal mood and affect.  Intake/Output from previous day: 02/18 0701 - 02/19 0700 In: 882.5 [I.V.:882.5] Out: 775 [Urine:775] Intake/Output this shift: No intake/output data recorded.  Lab Results: Recent Labs    01/05/18 0954 01/06/18 0604 01/07/18 0651  WBC 13.9* 11.4* 9.2  HGB 11.9* 11.7* 11.2*  HCT 35.1* 36.4* 35.0*  PLT 121* 136* 126*   BMET Recent Labs    01/05/18 0954 01/06/18 0604 01/07/18 0651  NA 135 138 136  K 3.7 3.8 4.3  CL 95* 99* 103  CO2 27 27 25   GLUCOSE 93 78 83  BUN 39* 33* 24*  CREATININE 2.88* 2.25* 1.81*  CALCIUM 8.9 8.7* 8.6*   LFT Recent Labs    01/05/18 0954  PROT 6.1*  ALBUMIN 2.8*  AST 19  ALT 13*  ALKPHOS 94  BILITOT 0.6     Studies/Results: Dg Chest 2 View  Result Date: 01/05/2018 CLINICAL DATA:  Vomiting and unable to keep down oral intake. EXAM: CHEST  2 VIEW COMPARISON:  09/20/2017 FINDINGS: Lungs are adequately inflated and otherwise clear. Cardiomediastinal silhouette and remainder of the exam is unchanged. IMPRESSION: No active cardiopulmonary disease. Electronically Signed   By: Marin Olp M.D.   On: 01/05/2018 10:23   Dg Abdomen 1 View  Result Date: 01/05/2018 CLINICAL DATA:  Vomiting and unable to keep down oral intake. EXAM:  ABDOMEN - 1 VIEW COMPARISON:  10/01/2017 FINDINGS: Air is present throughout the colon. There are a few air-filled small bowel loops with 1 small bowel loop at the upper limits of normal in diameter measuring 3 cm over the left mid abdomen. No definite free peritoneal air. No mass or mass effect. Moderate degenerate change of the spine. Bilateral hip arthroplasties unchanged. IMPRESSION: Nonspecific, nonobstructive bowel gas pattern with a single small bowel loop at the upper limits of normal in diameter over the right mid abdomen measuring 3 cm. Findings may be due to viral gastroenteritis or focal ileus. Electronically Signed   By: Marin Olp M.D.   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 He is a difficult and limited historian. EGD recently completed 12/23/17 with ulcerations just above GE junction with path negative for malignancy. No dilatation completed at that time.   Nausea and vomiting multifactorial in this setting. Clinically seems to be more consistent with gastroparesis flare and is tolerating clear liquids and improved with increased Reglan. No dysphagia per nursing staff or patient. Will hold off on BPE unless he has dysphagia with advancement to dysphagia level 3 diet. He will an EGD regardless in 3 months for surveillance.  Plan: PPI BID Reglan TID before meals Scheduled Zofran Trial dysphagia diet BPE if dysphagia noted with advanced diet As outpatient, needs to avoid Prilosec due to chronic Plavix Would benefit from [facility] referral if he is a candidate: will get this started   Annitta Needs, PhD, ANP-BC [facility]    LOS: 2 days    01/07/2018, 9:33 AM

## 2018-01-08 LAB — BASIC METABOLIC PANEL
Anion gap: 9 (ref 5–15)
BUN: 19 mg/dL (ref 6–20)
CHLORIDE: 103 mmol/L (ref 101–111)
CO2: 23 mmol/L (ref 22–32)
Calcium: 8.7 mg/dL — ABNORMAL LOW (ref 8.9–10.3)
Creatinine, Ser: 1.55 mg/dL — ABNORMAL HIGH (ref 0.61–1.24)
GFR, EST AFRICAN AMERICAN: 48 mL/min — AB (ref >60)
GFR, EST NON AFRICAN AMERICAN: 41 mL/min — AB (ref >60)
Glucose, Bld: 91 mg/dL (ref 65–99)
Potassium: 4.5 mmol/L (ref 3.5–5.1)
SODIUM: 135 mmol/L (ref 135–145)

## 2018-01-08 LAB — MAGNESIUM: MAGNESIUM: 1.9 mg/dL (ref 1.7–2.4)

## 2018-01-08 NOTE — Progress Notes (Signed)
PROGRESS NOTE    Justin Parsons  IYM:[id number] DOB: Oct 17, 1941 DOA: 01/05/2018 PCP: Monico Blitz, MD     Brief Narrative:  77 year old man admitted from home on 2/17 with a one week history of intractable nausea and vomiting.  He has a history of [place] plus EtOH cirrhosis, esophageal stricture and pyloric stenosis.  He has waxed and waned throughout his hospital course.  Today is feeling improved, has been able to tolerate some solids and has had no emesis.   Assessment & Plan:   Active Problems:   Cirrhosis (HCC)   Thrombocytopenia (HCC)   Hypothyroidism   Dysphagia, pharyngoesophageal phase   Esophageal stricture   Acute renal failure superimposed on stage 3 chronic kidney disease (HCC)   Intractable vomiting with nausea   Gastric outlet obstruction   Acute on chronic renal failure (HCC)   Intractable nausea and vomiting -Per nursing has been eating about 75% of his meals. -Patient admits to no emesis today.  Nausea is improved. -We will discuss with GI plan for further studies, if none would anticipate discharge home over next 24 hours. -He does have a history of an esophageal stricture.    Acute on chronic kidney disease stage III -Creatinine on admission was 2.8, baseline creatinine is around 1.8, creatinine is now down to his baseline of around 1.5. -Lasix on hold.  Chronic atrial fibrillation -Rate controlled on Coreg. -Poor candidate for anticoagulation secondary to liver cirrhosis and portal hypertensive gastropathy.  Coronary artery disease -No chest pain.  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   DVT prophylaxis: SCDs Code Status: Full code Family Communication: Patient only Disposition Plan: Pending further GI recommendations, would anticipate discharge however next 24 hours  Consultants:    GI  Procedures:   None  Antimicrobials:  Anti-infectives (From admission, onward)   None       Subjective: Sitting in bed, states he feels significantly improved, has been eating some today, no emesis, minimal nausea.  Objective: Vitals:   01/07/18 1300 01/07/18 2151 01/08/18 0300 01/08/18 1415  BP: 105/72 109/78 111/83 108/72  Pulse: 88 84 97 95  Resp: 18 18 18 18   Temp:  98.6 F (37 C) 98.5 F (36.9 C) 98.6 F (37 C)  TempSrc:  Oral Oral Oral  SpO2: 99% 95% 97% 97%  Weight:      Height:        Intake/Output Summary (Last 24 hours) at 01/08/2018 1902 Last data filed at 01/08/2018 1800 Gross per 24 hour  Intake 2555 ml  Output 350 ml  Net 2205 ml   Filed Weights   01/05/18 0905 01/05/18 1534  Weight: 104.3 kg (230 lb) 98.4 kg (216 lb 14.4 oz)    Examination:  General exam: Alert, awake, oriented x 3 Respiratory system: Clear to auscultation. Respiratory effort normal. Cardiovascular system:RRR. No murmurs, rubs, gallops. Gastrointestinal system: Abdomen is nondistended, soft and nontender. No organomegaly or masses felt. Normal bowel sounds heard. Central nervous system: Alert and oriented. No focal neurological deficits. Extremities: No C/C/E, +pedal pulses Skin: No rashes, lesions or ulcers Psychiatry: Judgement and insight appear normal. Mood & affect appropriate.     Data Reviewed: I have personally reviewed following labs and imaging studies  CBC: Recent Labs  Lab 01/05/18 0954 01/06/18 0604 01/07/18 0651  WBC 13.9* 11.4* 9.2  NEUTROABS 10.8*  --   --   HGB 11.9* 11.7* 11.2*  HCT  35.1* 36.4* 35.0*  MCV 86.9 89.4 90.7  PLT 121* 136* 536*   Basic Metabolic Panel: Recent Labs  Lab 01/05/18 0954 01/06/18 0604 01/07/18 0651 01/08/18 0607  NA 135 138 136 135  K 3.7 3.8 4.3 4.5  CL 95* 99* 103 103  CO2 27 27 25 23   GLUCOSE 93 78 83 91  BUN 39* 33* 24* 19  CREATININE 2.88* 2.25* 1.81* 1.55*  CALCIUM 8.9 8.7* 8.6* 8.7*  MG  --   --    --  1.9   GFR: Estimated Creatinine Clearance: 45.4 mL/min (A) (by C-G formula based on SCr of 1.55 mg/dL (H)). Liver Function Tests: Recent Labs  Lab 01/05/18 0954  AST 19  ALT 13*  ALKPHOS 94  BILITOT 0.6  PROT 6.1*  ALBUMIN 2.8*   Recent Labs  Lab 01/05/18 0954  LIPASE 27   Recent Labs  Lab 01/05/18 0954  AMMONIA <9*   Coagulation Profile: No results for input(s): INR, PROTIME in the last 168 hours. Cardiac Enzymes: No results for input(s): CKTOTAL, CKMB, CKMBINDEX, TROPONINI in the last 168 hours. BNP (last 3 results) No results for input(s): PROBNP in the last 8760 hours. HbA1C: No results for input(s): HGBA1C in the last 72 hours. CBG: No results for input(s): GLUCAP in the last 168 hours. Lipid Profile: No results for input(s): CHOL, HDL, LDLCALC, TRIG, CHOLHDL, LDLDIRECT in the last 72 hours. Thyroid Function Tests: No results for input(s): TSH, T4TOTAL, FREET4, T3FREE, THYROIDAB in the last 72 hours. Anemia Panel: No results for input(s): VITAMINB12, FOLATE, FERRITIN, TIBC, IRON, RETICCTPCT in the last 72 hours. Urine analysis:    Component Value Date/Time   COLORURINE YELLOW 01/05/2018 [place] 01/05/2018 1433   LABSPEC 1.012 01/05/2018 1433   PHURINE 5.0 01/05/2018 1433   GLUCOSEU NEGATIVE 01/05/2018 1433   HGBUR NEGATIVE 01/05/2018 1433   BILIRUBINUR NEGATIVE 01/05/2018 1433   KETONESUR NEGATIVE 01/05/2018 1433   PROTEINUR 30 (A) 01/05/2018 1433   UROBILINOGEN 0.2 11/12/2013 2315   NITRITE NEGATIVE 01/05/2018 1433   LEUKOCYTESUR TRACE (A) 01/05/2018 1433   Sepsis Labs: @LABRCNTIP (procalcitonin:4,lacticidven:4)  )No results found for this or any previous visit (from the past 240 hour(s)).       Radiology Studies: No results found.      Scheduled Meds: . allopurinol  100 mg Oral Daily  . aspirin  81 mg Oral Daily  . atorvastatin  80 mg Oral q1800  . cholecalciferol  400 Units Oral BID  . clopidogrel  75 mg Oral  Daily  . feeding supplement  237 mL Oral QID  . folic acid  1 mg Oral Daily  . heparin  5,000 Units Subcutaneous Q8H  . levothyroxine  25 mcg Intravenous QAC breakfast  . metoCLOPramide (REGLAN) injection  5 mg Intravenous TID AC  . ondansetron (ZOFRAN) IV  4 mg Intravenous TID AC  . pantoprazole  40 mg Oral BID AC   Continuous Infusions: . 0.9 % NaCl with KCl 20 mEq / L 75 mL/hr at 01/08/18 1716     LOS: 3 days    Time spent: 25 minutes. Greater than 50% of this time was spent in direct contact with the patient coordinating care.     Lelon Frohlich, MD [facility] Pager [phone]  If 7PM-7AM, please contact night-coverage [email or web address] Password [facility] 01/08/2018, 7:02 PM

## 2018-01-08 NOTE — Progress Notes (Signed)
Subjective:  Complains of lack of taste. Smells make him nauseated. When he forces himself to eat it will come back up. No vomiting overnight but not eating much per patient. BM yesterday. No abd pain. States at home he does good some days with his meals but then has days where he cannot eat at all. Usually does really well for 1-2 weeks after his EGDs. Did not have dilation with his last one a few weeks ago. Reports over a 100 pound weight loss in the past year. Lost 40 pounds (274 down to 234) from 04/2017 to 07/2017. Weight on admission 230??  Objective: Vital signs in last 24 hours: Temp:  [98.5 F (36.9 C)-98.6 F (37 C)] 98.5 F (36.9 C) (02/20 0300) Pulse Rate:  [84-97] 97 (02/20 0300) Resp:  [18] 18 (02/20 0300) BP: (105-111)/(72-83) 111/83 (02/20 0300) SpO2:  [95 %-99 %] 97 % (02/20 0300) Last BM Date: 01/07/18 General:   Alert,  Well-developed, well-nourished, pleasant and cooperative in NAD Head:  Normocephalic and atraumatic. Eyes:  Sclera clear, no icterus.  Abdomen:  Soft, nontender and nondistended.  Normal bowel sounds, without guarding, and without rebound.   Neurologic:  Alert and  oriented x4;  grossly normal neurologically. Psych:  Alert and cooperative. Normal mood and affect.  Intake/Output from previous day: 02/19 0701 - 02/20 0700 In: 2280 [P.O.:480; I.V.:1800] Out: 650 [Urine:650] Intake/Output this shift: No intake/output data recorded.  Lab Results: CBC Recent Labs    01/05/18 0954 01/06/18 0604 01/07/18 0651  WBC 13.9* 11.4* 9.2  HGB 11.9* 11.7* 11.2*  HCT 35.1* 36.4* 35.0*  MCV 86.9 89.4 90.7  PLT 121* 136* 126*   BMET Recent Labs    01/06/18 0604 01/07/18 0651 01/08/18 0607  NA 138 136 135  K 3.8 4.3 4.5  CL 99* 103 103  CO2 27 25 23   GLUCOSE 78 83 91  BUN 33* 24* 19  CREATININE 2.25* 1.81* 1.55*  CALCIUM 8.7* 8.6* 8.7*   LFTs Recent Labs    01/05/18 0954  BILITOT 0.6  ALKPHOS 94  AST 19  ALT 13*  PROT 6.1*  ALBUMIN 2.8*    Recent Labs    01/05/18 0954  LIPASE 27   PT/INR No results for input(s): LABPROT, INR in the last 72 hours.    Imaging Studies: Dg Chest 2 View  Result Date: 01/05/2018 CLINICAL DATA:  Vomiting and unable to keep down oral intake. EXAM: CHEST  2 VIEW COMPARISON:  09/20/2017 FINDINGS: Lungs are adequately inflated and otherwise clear. Cardiomediastinal silhouette and remainder of the exam is unchanged. IMPRESSION: No active cardiopulmonary disease. Electronically Signed   By: Marin Olp M.D.   On: 01/05/2018 10:23   Dg Abdomen 1 View  Result Date: 01/05/2018 CLINICAL DATA:  Vomiting and unable to keep down oral intake. EXAM: ABDOMEN - 1 VIEW COMPARISON:  10/01/2017 FINDINGS: Air is present throughout the colon. There are a few air-filled small bowel loops with 1 small bowel loop at the upper limits of normal in diameter measuring 3 cm over the left mid abdomen. No definite free peritoneal air. No mass or mass effect. Moderate degenerate change of the spine. Bilateral hip arthroplasties unchanged. IMPRESSION: Nonspecific, nonobstructive bowel gas pattern with a single small bowel loop at the upper limits of normal in diameter over the right mid abdomen measuring 3 cm. Findings may be due to viral gastroenteritis or focal ileus. Electronically Signed   By: Marin Olp M.D.   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 He is a difficult and limited historian. EGD recently completed 12/23/17 with ulcerations just above GE junction with path negative for malignancy. No dilatation completed at that time.   Patient seemed to have some improvement since admission with no vomiting.  Persistent nausea and "I can't taste anything". Likely multifactorial. Unclear if pyloric stenosis contributed to abnormal GES back in 07/2017. Patient with significant weight loss from 04/2017 to 07/2017 but definitely slowed since then. Reports temporary relief of N/V with endoscopies even without dilation few weeks back.    Plan: 1. Continue scheduled Reglan and Zofran.  2. Encouraged oral intake.  3. PPI BID. 4. Return for EGD in 03/2018 as planned.  5. reweigh patient due to weight discrepancies.   Laureen Ochs. Bernarda Caffey [facility] [phone] 2/20/201910:14 AM     LOS: 3 days

## 2018-01-09 ENCOUNTER — Inpatient Hospital Stay (HOSPITAL_COMMUNITY): Payer: Medicare Other

## 2018-01-09 DIAGNOSIS — R1310 Dysphagia, unspecified: Secondary | ICD-10-CM

## 2018-01-09 LAB — CBC
HEMATOCRIT: 38.5 % — AB (ref 39.0–52.0)
Hemoglobin: 12.1 g/dL — ABNORMAL LOW (ref 13.0–17.0)
MCH: 28.5 pg (ref 26.0–34.0)
MCHC: 31.4 g/dL (ref 30.0–36.0)
MCV: 90.6 fL (ref 78.0–100.0)
Platelets: 152 10*3/uL (ref 150–400)
RBC: 4.25 MIL/uL (ref 4.22–5.81)
RDW: 17.2 % — ABNORMAL HIGH (ref 11.5–15.5)
WBC: 7.7 10*3/uL (ref 4.0–10.5)

## 2018-01-09 LAB — BASIC METABOLIC PANEL
Anion gap: 8 (ref 5–15)
BUN: 14 mg/dL (ref 6–20)
CALCIUM: 8.6 mg/dL — AB (ref 8.9–10.3)
CO2: 23 mmol/L (ref 22–32)
Chloride: 103 mmol/L (ref 101–111)
Creatinine, Ser: 1.54 mg/dL — ABNORMAL HIGH (ref 0.61–1.24)
GFR calc non Af Amer: 41 mL/min — ABNORMAL LOW (ref >60)
GFR, EST AFRICAN AMERICAN: 48 mL/min — AB (ref >60)
GLUCOSE: 80 mg/dL (ref 65–99)
POTASSIUM: 4.8 mmol/L (ref 3.5–5.1)
Sodium: 134 mmol/L — ABNORMAL LOW (ref 135–145)

## 2018-01-09 NOTE — Progress Notes (Signed)
This RN assisting with patient's care today beginning at approximately this time.

## 2018-01-09 NOTE — Progress Notes (Signed)
PROGRESS NOTE    Justin Parsons  IOE:[id number] DOB: Nov 28, 1940 DOA: 01/05/2018 PCP: Monico Blitz, MD     Brief Narrative:  77 year old man admitted from home on 2/17 with a one week history of intractable nausea and vomiting.  He has a history of Nash plus EtOH cirrhosis, esophageal stricture and pyloric stenosis.  He has waxed and waned throughout his hospital course.  Had barium pill esophagus today which got lodged at the EG junction.  Dr. Oneida Alar has arranged for patient to have manometry/impedance that has been scheduled at [facility] tomorrow morning at 9 AM.  Assessment & Plan:   Active Problems:   Cirrhosis (HCC)   Thrombocytopenia (HCC)   Hypothyroidism   Dysphagia, pharyngoesophageal phase   Esophageal stricture   Acute renal failure superimposed on stage 3 chronic kidney disease (HCC)   Intractable vomiting with nausea   Gastric outlet obstruction   Acute on chronic renal failure (HCC)   Dysphagia   Intractable nausea and vomiting/dysphagia -Has had esophageal dilatation x3 since September 2018, however dysphagia persists. -Barium pill esophagus today shows moderate narrowing within the distal esophagus.  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 -GI has arranged for patient to have manometry studies tomorrow at [facility]. -Nausea and vomiting have improved.  Acute on chronic kidney disease stage III -Creatinine on admission was 2.8, baseline creatinine is around 1.8, creatinine is now down to his baseline of around 1.5. -Lasix on hold.  Chronic atrial fibrillation -Rate controlled on Coreg. -Poor candidate for anticoagulation secondary to liver cirrhosis and portal hypertensive gastropathy.  Coronary artery disease -No chest pain.  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   DVT  prophylaxis: SCDs Code Status: Full code Family Communication: Patient only Disposition Plan: To [facility] tomorrow for manometry testing.  Consultants:   GI  Procedures:   None  Antimicrobials:  Anti-infectives (From admission, onward)   None       Subjective: Lying in bed,  states he has had some food sticking in his chest today.  Objective: Vitals:   01/08/18 2102 01/09/18 0500 01/09/18 1313 01/09/18 1500  BP: 123/76 123/77 119/84 123/81  Pulse: 84 (!) 114 (!) 111 (!) 103  Resp: 19 19 17 18   Temp: 98.2 F (36.8 C) 97.7 F (36.5 C) 97.7 F (36.5 C) 98.2 F (36.8 C)  TempSrc: Oral Oral Oral Oral  SpO2: 96%  93% 93%  Weight:  102.9 kg (226 lb 13.7 oz)    Height:        Intake/Output Summary (Last 24 hours) at 01/09/2018 1756 Last data filed at 01/09/2018 1400 Gross per 24 hour  Intake 2040 ml  Output 825 ml  Net 1215 ml   Filed Weights   01/05/18 0905 01/05/18 1534 01/09/18 0500  Weight: 104.3 kg (230 lb) 98.4 kg (216 lb 14.4 oz) 102.9 kg (226 lb 13.7 oz)    Examination:  General exam: Alert, awake, oriented x 3 Respiratory system: Clear to auscultation. Respiratory effort normal. Cardiovascular system:RRR. No murmurs, rubs, gallops. Gastrointestinal system: Abdomen is nondistended, soft and nontender. No organomegaly or masses felt. Normal bowel sounds heard. Central nervous system: Alert and oriented. No focal neurological deficits. Extremities: No C/C/E, +pedal pulses Skin: No rashes, lesions or ulcers Psychiatry: Judgement and insight appear normal. Mood & affect appropriate.      Data Reviewed: I have personally reviewed following labs and  imaging studies  CBC: Recent Labs  Lab 01/05/18 0954 01/06/18 0604 01/07/18 0651 01/09/18 0550  WBC 13.9* 11.4* 9.2 7.7  NEUTROABS 10.8*  --   --   --   HGB 11.9* 11.7* 11.2* 12.1*  HCT 35.1* 36.4* 35.0* 38.5*  MCV 86.9 89.4 90.7 90.6  PLT 121* 136* 126* 948   Basic Metabolic Panel: Recent Labs   Lab 01/05/18 0954 01/06/18 0604 01/07/18 0651 01/08/18 0607 01/09/18 0550  NA 135 138 136 135 134*  K 3.7 3.8 4.3 4.5 4.8  CL 95* 99* 103 103 103  CO2 27 27 25 23 23   GLUCOSE 93 78 83 91 80  BUN 39* 33* 24* 19 14  CREATININE 2.88* 2.25* 1.81* 1.55* 1.54*  CALCIUM 8.9 8.7* 8.6* 8.7* 8.6*  MG  --   --   --  1.9  --    GFR: Estimated Creatinine Clearance: 46.7 mL/min (A) (by C-G formula based on SCr of 1.54 mg/dL (H)). Liver Function Tests: Recent Labs  Lab 01/05/18 0954  AST 19  ALT 13*  ALKPHOS 94  BILITOT 0.6  PROT 6.1*  ALBUMIN 2.8*   Recent Labs  Lab 01/05/18 0954  LIPASE 27   Recent Labs  Lab 01/05/18 0954  AMMONIA <9*   Coagulation Profile: No results for input(s): INR, PROTIME in the last 168 hours. Cardiac Enzymes: No results for input(s): CKTOTAL, CKMB, CKMBINDEX, TROPONINI in the last 168 hours. BNP (last 3 results) No results for input(s): PROBNP in the last 8760 hours. HbA1C: No results for input(s): HGBA1C in the last 72 hours. CBG: No results for input(s): GLUCAP in the last 168 hours. Lipid Profile: No results for input(s): CHOL, HDL, LDLCALC, TRIG, CHOLHDL, LDLDIRECT in the last 72 hours. Thyroid Function Tests: No results for input(s): TSH, T4TOTAL, FREET4, T3FREE, THYROIDAB in the last 72 hours. Anemia Panel: No results for input(s): VITAMINB12, FOLATE, FERRITIN, TIBC, IRON, RETICCTPCT in the last 72 hours. Urine analysis:    Component Value Date/Time   COLORURINE YELLOW 01/05/2018 [place] 01/05/2018 1433   LABSPEC 1.012 01/05/2018 1433   PHURINE 5.0 01/05/2018 1433   GLUCOSEU NEGATIVE 01/05/2018 1433   HGBUR NEGATIVE 01/05/2018 1433   BILIRUBINUR NEGATIVE 01/05/2018 1433   KETONESUR NEGATIVE 01/05/2018 1433   PROTEINUR 30 (A) 01/05/2018 1433   UROBILINOGEN 0.2 11/12/2013 2315   NITRITE NEGATIVE 01/05/2018 1433   LEUKOCYTESUR TRACE (A) 01/05/2018 1433   Sepsis  Labs: @LABRCNTIP (procalcitonin:4,lacticidven:4)  )No results found for this or any previous visit (from the past 240 hour(s)).       Radiology Studies: Dg Esophagus  Result Date: 01/09/2018 CLINICAL DATA:  Dysphagia, vomiting EXAM: ESOPHOGRAM/BARIUM SWALLOW TECHNIQUE: Single contrast examination was performed using  thin barium. FLUOROSCOPY TIME:  Fluoroscopy Time:  2 min 6 sec Radiation Exposure Index (if provided by the fluoroscopic device): Number of Acquired Spot Images: 0 COMPARISON:  None. FINDINGS: Fluoroscopic evaluation of swallowing demonstrates disruption of primary esophageal waves. There is persistent narrowing within the distal esophagus. 13 mm barium tablet sticks in this area. No other areas of esophageal stricture or narrowing. No fold thickening. IMPRESSION: Moderate narrowing within the distal esophagus, smooth and long segment. This could be related to reflux stricture. The 13 mm barium tablet sticks in this area. Esophageal dysmotility. Electronically Signed   By: Rolm Baptise M.D.   On: 01/09/2018 10:27        Scheduled Meds: . allopurinol  100 mg Oral Daily  . aspirin  81 mg  Oral Daily  . atorvastatin  80 mg Oral q1800  . cholecalciferol  400 Units Oral BID  . clopidogrel  75 mg Oral Daily  . feeding supplement  237 mL Oral QID  . folic acid  1 mg Oral Daily  . heparin  5,000 Units Subcutaneous Q8H  . levothyroxine  25 mcg Intravenous QAC breakfast  . metoCLOPramide (REGLAN) injection  5 mg Intravenous TID AC  . ondansetron (ZOFRAN) IV  4 mg Intravenous TID AC  . pantoprazole  40 mg Oral BID AC   Continuous Infusions: . 0.9 % NaCl with KCl 20 mEq / L 75 mL/hr at 01/09/18 0514     LOS: 4 days    Time spent: 25 minutes. Greater than 50% of this time was spent in direct contact with the patient coordinating care.     Lelon Frohlich, MD [facility] Pager [phone]  If 7PM-7AM, please contact night-coverage [email or web address] Password  [facility] 01/09/2018, 5:56 PM

## 2018-01-09 NOTE — Care Management Important Message (Signed)
Important Message  Patient Details  Name: Justin Parsons MRN: [id number] Date of Birth: 18-Dec-1940   Medicare Important Message Given:  Yes    Sherald Barge, RN 01/09/2018, 1:25 PM

## 2018-01-09 NOTE — Progress Notes (Addendum)
Subjective:  Resting. No specific complaints right now. Patient anticipating discharge today.   Objective: Vital signs in last 24 hours: Temp:  [97.7 F (36.5 C)-98.6 F (37 C)] 97.7 F (36.5 C) (02/21 0500) Pulse Rate:  [84-114] 114 (02/21 0500) Resp:  [18-19] 19 (02/21 0500) BP: (108-123)/(72-77) 123/77 (02/21 0500) SpO2:  [96 %-97 %] 96 % (02/20 2102) Weight:  [226 lb 13.7 oz (102.9 kg)] 226 lb 13.7 oz (102.9 kg) (02/21 0500) Last BM Date: 01/07/18 General:   Alert,  Well-developed, well-nourished, pleasant and cooperative in NAD Head:  Normocephalic and atraumatic. Eyes:  Sclera clear, no icterus.  Abdomen:  Soft, nontender and nondistended.  Neurologic:  Alert and  oriented x4;  grossly normal neurologically. Psych:  Alert and cooperative. Normal mood and affect.  Intake/Output from previous day: 02/20 0701 - 02/21 0700 In: 3306.3 [P.O.:530; I.V.:2776.3] Out: 1025 [Urine:1025] Intake/Output this shift: No intake/output data recorded.  Lab Results: CBC Recent Labs    01/07/18 0651 01/09/18 0550  WBC 9.2 7.7  HGB 11.2* 12.1*  HCT 35.0* 38.5*  MCV 90.7 90.6  PLT 126* 152   BMET Recent Labs    01/07/18 0651 01/08/18 0607 01/09/18 0550  NA 136 135 134*  K 4.3 4.5 4.8  CL 103 103 103  CO2 25 23 23   GLUCOSE 83 91 80  BUN 24* 19 14  CREATININE 1.81* 1.55* 1.54*  CALCIUM 8.6* 8.7* 8.6*   LFTs No results for input(s): BILITOT, BILIDIR, IBILI, ALKPHOS, AST, ALT, PROT, ALBUMIN in the last 72 hours. No results for input(s): LIPASE in the last 72 hours. PT/INR No results for input(s): LABPROT, INR in the last 72 hours.    Imaging Studies: Dg Chest 2 View  Result Date: 01/05/2018 CLINICAL DATA:  Vomiting and unable to keep down oral intake. EXAM: CHEST  2 VIEW COMPARISON:  09/20/2017 FINDINGS: Lungs are adequately inflated and otherwise clear. Cardiomediastinal silhouette and remainder of the exam is unchanged. IMPRESSION: No active cardiopulmonary disease.  Electronically Signed   By: Marin Olp M.D.   On: 01/05/2018 10:23   Dg Abdomen 1 View  Result Date: 01/05/2018 CLINICAL DATA:  Vomiting and unable to keep down oral intake. EXAM: ABDOMEN - 1 VIEW COMPARISON:  10/01/2017 FINDINGS: Air is present throughout the colon. There are a few air-filled small bowel loops with 1 small bowel loop at the upper limits of normal in diameter measuring 3 cm over the left mid abdomen. No definite free peritoneal air. No mass or mass effect. Moderate degenerate change of the spine. Bilateral hip arthroplasties unchanged. IMPRESSION: Nonspecific, nonobstructive bowel gas pattern with a single small bowel loop at the upper limits of normal in diameter over the right mid abdomen measuring 3 cm. Findings may be due to viral gastroenteritis or focal ileus. Electronically Signed   By: Marin Olp M.D.   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 He is a difficult and limited historian.EGD recently completed 12/23/17 with ulcerations just above GE junction with path negative for malignancy. No dilatation completed at that time.   Patient seemed to have some improvement since admission with no vomiting. Persistent nausea and "I can't taste anything". Likely multifactorial. Unclear if pyloric stenosis contributed to abnormal GES back  in 07/2017. Patient with significant weight loss from 04/2017 to 07/2017 but definitely slowed since then. Reports temporary relief of N/V with endoscopies even without dilation few weeks back.   Patient consistently eating 50-75% of meals last 48 hours. Complained of being unable to swallow again yesterday evening.    Plan: 1. Plans  for barium pill esophagram today. Await findings.    Laureen Ochs. Bernarda Caffey [facility] [phone] 2/21/20199:07 AM     LOS: 4 days     Addendum: moderate narrowing distal esophagus, 57m tablet sticks. Esophageal dysmotility. Consider EGD/ED with propofol. Last dose of Heparin [place] at 0514. Patient consumed 1/2 of his full liquid lunch tray however. Will need to plan for tomorrow.   LLaureen Ochs LBernarda [facility] [phone]/21/201912:20 PM

## 2018-01-09 NOTE — Care Management Note (Signed)
Case Management Note  Patient Details  Name: Justin Parsons MRN: [id number] Date of Birth: 01/22/1941  Subjective/Objective:           Admitted from home. Pt reports that he lives at home with "friend". This friend assists with meal prep and ADL's as needed. Pt uses WC for most mobility but has walker he uses sometimes. He has family who help manage medications. He has had HH in the past. Per chart this was through [facility]. He is not interested in [facility] at this time. He says he just wants to be able to eat. Says he is having a test done today and hopeful to go home afterwards.          Action/Plan: DC home with self care.   Expected Discharge Date:     01/09/2018             Expected Discharge Plan:  Home/Self Care  In-House Referral:  NA  Discharge planning Services  CM Consult  Post Acute Care Choice:  NA Choice offered to:  NA  Status of Service:  Completed, signed off   Sherald Barge, RN 01/09/2018, 1:20 PM

## 2018-01-10 ENCOUNTER — Ambulatory Visit (HOSPITAL_COMMUNITY): Admission: RE | Admit: 2018-01-10 | Payer: Medicare Other | Source: Ambulatory Visit | Admitting: Gastroenterology

## 2018-01-10 ENCOUNTER — Inpatient Hospital Stay (HOSPITAL_COMMUNITY): Payer: Medicare Other

## 2018-01-10 ENCOUNTER — Encounter (HOSPITAL_COMMUNITY)
Admission: EM | Disposition: A | Discharge status: Home Health | Payer: Self-pay | Source: Home / Self Care | Attending: Internal Medicine

## 2018-01-10 ENCOUNTER — Encounter (HOSPITAL_COMMUNITY): Payer: Self-pay | Admitting: Gastroenterology

## 2018-01-10 HISTORY — PX: ESOPHAGEAL MANOMETRY: SHX5429

## 2018-01-10 SURGERY — MANOMETRY, ESOPHAGUS

## 2018-01-10 MED ORDER — METOPROLOL TARTRATE 5 MG/5ML IV SOLN
10.0000 mg | Freq: Once | INTRAVENOUS | Status: AC
  Administered 2018-01-10: 10 mg via INTRAVENOUS
  Filled 2018-01-10: qty 10

## 2018-01-10 MED ORDER — AMOXICILLIN-POT CLAVULANATE 200-28.5 MG/5ML PO SUSR
400.0000 mg | Freq: Two times a day (BID) | ORAL | Status: DC
  Administered 2018-01-10 – 2018-01-15 (×10): 400 mg via ORAL
  Filled 2018-01-10 (×14): qty 10

## 2018-01-10 MED ORDER — ALBUTEROL SULFATE (2.5 MG/3ML) 0.083% IN NEBU
2.5000 mg | INHALATION_SOLUTION | Freq: Four times a day (QID) | RESPIRATORY_TRACT | Status: DC
Start: 2018-01-10 — End: 2018-01-11
  Administered 2018-01-10 – 2018-01-11 (×4): 2.5 mg via RESPIRATORY_TRACT
  Filled 2018-01-10 (×4): qty 3

## 2018-01-10 MED ORDER — ALBUTEROL SULFATE HFA 108 (90 BASE) MCG/ACT IN AERS
2.0000 | INHALATION_SPRAY | Freq: Four times a day (QID) | RESPIRATORY_TRACT | Status: DC
Start: 2018-01-10 — End: 2018-01-10

## 2018-01-10 MED ORDER — AMOXICILLIN-POT CLAVULANATE 400-57 MG/5ML PO SUSR
400.0000 mg | Freq: Two times a day (BID) | ORAL | Status: DC
  Filled 2018-01-10 (×6): qty 5

## 2018-01-10 MED ORDER — CARVEDILOL 3.125 MG PO TABS
3.1250 mg | ORAL_TABLET | Freq: Two times a day (BID) | ORAL | Status: DC
  Administered 2018-01-10 – 2018-01-15 (×10): 3.125 mg via ORAL
  Filled 2018-01-10 (×11): qty 1

## 2018-01-10 SURGICAL SUPPLY — 2 items
FACESHIELD LNG OPTICON STERILE (SAFETY) IMPLANT
GLOVE BIO SURGEON STRL SZ8 (GLOVE) ×6 IMPLANT

## 2018-01-10 NOTE — Progress Notes (Signed)
Patient returned from [facility] via [place].  2L [place] in place.  Patient c/o congested cough and requesting something other than Robitussin for cough.  Dr. Jerilee Hoh notified via text page.

## 2018-01-10 NOTE — Progress Notes (Signed)
Patient's heart rate 130-140s Afib on telemetry. Patient denies pain, shortness of breath.  Dr. Jerilee Hoh notified with orders to follow.

## 2018-01-10 NOTE — Care Management Important Message (Signed)
Important Message  Patient Details  Name: Justin Parsons MRN: [id number] Date of Birth: [birth year]   Medicare Important Message Given:  Yes    Sherald Barge, RN 01/10/2018, 8:07 AM

## 2018-01-10 NOTE — Progress Notes (Signed)
Patient reports voiding twice this afternoon and that, "they emptied my urinal".  No output recorded.  Bladder scan reveals no urine.  Will continue to monitor.  Oncoming RN notified.

## 2018-01-10 NOTE — Progress Notes (Signed)
Patient ID: Justin Parsons, male   DOB: 12/22/1940, 77 y.o.   MRN: [id number]   Assessment/Plan: Admitted with dysphagia. Unable to advance diet. Has a productive cough-POSSIBLE ASPIRATION PNEUMONITIS/PNA.Marland Kitchen   PLAN: 1. AUGMENTIN FOR COUGH. MDI Q6H. 2. FULL LIQUID DIET 3. AWAIT MANOMETRY/IMPEDANCE RESULTS.   Subjective: Since I last evaluated the patient HE COMPLAINS OF HAVING A COUGH THAT'S GETTING WORSE SINCE BEFORE HE CAME TO ED.  HAS CHEST PAIN WEHER HE COUGHS SO MUCH.TOLERATED AND COMPLETED THE MANOMETRY/IMPEDANCE. NAUSEA/VOMITING/ABDOMINAL PAIN  Objective: Vital signs in last 24 hours: Vitals:   01/10/18 0841 01/10/18 1547  BP: 119/76 122/86  Pulse: 92 95  Resp:  18  Temp:  98.4 F (36.9 C)  SpO2:  98%   General appearance: alert, cooperative and no distress Resp: COARSE BREATH SOUND SBILATERALLY Cardio: regular rate and rhythm GI: soft, non-tender; bowel sounds normal; no masses,  no organomegaly  Lab Results:  K 4.8 Cr 1.54 Hb 12.1 WBC 7.7 PLT CT 152   Studies/Results: CXR: FEB 17: NACPD  Medications: I have reviewed the patient's current medications.   LOS: 5 days   Barney Drain 04/29/2014, 2:23 PM

## 2018-01-10 NOTE — Progress Notes (Signed)
PROGRESS NOTE    Justin Parsons  HGD:[id number] DOB: Jul 15, 1941 DOA: 01/05/2018 PCP: Monico Blitz, MD     Brief Narrative:  77 year old man admitted from home on 2/17 with a one week history of intractable nausea and vomiting.  He has a history of Nash plus EtOH cirrhosis, esophageal stricture and pyloric stenosis.  He has waxed and waned throughout his hospital course.  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  Dr. Oneida Alar has arranged for patient to have manometry/impedance testing on 2/22, results are currently pending.  Assessment & Plan:   Active Problems:   Cirrhosis (HCC)   Thrombocytopenia (HCC)   Hypothyroidism   Dysphagia, pharyngoesophageal phase   Esophageal stricture   Acute renal failure superimposed on stage 3 chronic kidney disease (HCC)   Intractable vomiting with nausea   Gastric outlet obstruction   Acute on chronic renal failure (HCC)   Dysphagia   Intractable nausea and vomiting/dysphagia -Has had esophageal dilatation x3 since September 2018, however dysphagia persists. -Barium pill esophagus  shows moderate narrowing within the distal esophagus.  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 -Patient has had manometry/impedance studies on 2/22, results are pending. -Nausea and vomiting have improved. -Patient reports to GI today that he has been having increased cough productive of yellow sputum.  GI has started patient on Augmentin.  Of note patient has not mentioned this to me nor have I witnessed cough while I was at bedside.  Will check chest x-ray.  Acute on chronic kidney disease stage III -Creatinine on admission was 2.8, baseline creatinine is around 1.8, creatinine is now down to his baseline of around 1.5. -Lasix on hold.  Chronic atrial fibrillation -Rate controlled on Coreg. -Poor candidate for anticoagulation secondary to liver cirrhosis and portal hypertensive gastropathy.  Coronary  artery disease -No chest pain.  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   DVT prophylaxis: SCDs Code Status: Full code Family Communication: Patient only Disposition Plan: Await results of manometry testing  Consultants:   GI  Procedures:   None  Antimicrobials:  Anti-infectives (From admission, onward)   Start     Dose/Rate Route Frequency Ordered Stop   01/10/18 1800  amoxicillin-clavulanate (AUGMENTIN) 400-57 MG/5ML suspension 400 mg  Status:  Discontinued     400 mg Oral 2 times daily with meals 01/10/18 1627 01/10/18 1716   01/10/18 1800  amoxicillin-clavulanate (AUGMENTIN) 200-28.5 MG/5ML suspension 400 mg     400 mg of amoxicillin Oral 2 times daily with meals 01/10/18 1716         Subjective: Seen early this morning prior to transfer to [facility], no complaints other than being hungry.  Objective: Vitals:   01/10/18 0841 01/10/18 1547 01/10/18 1656 01/10/18 1746  BP: 119/76 122/86    Pulse: 92 95 (!) 108   Resp:  18    Temp:  98.4 F (36.9 C)    TempSrc:  Oral    SpO2:  98%  93%  Weight:      Height:        Intake/Output Summary (Last 24 hours) at 01/10/2018 1805 Last data filed at 01/10/2018 0407 Gross per 24 hour  Intake 675 ml  Output 850 ml  Net -175 ml   Filed Weights   01/05/18 1534 01/09/18 0500 01/10/18 0600  Weight: 98.4 kg (216 lb 14.4 oz) 102.9 kg (226 lb  13.7 oz) 104.2 kg (229 lb 11.5 oz)    Examination:  General exam: Alert, awake, oriented x 3 Respiratory system: Clear to auscultation. Respiratory effort normal. Cardiovascular system:RRR. No murmurs, rubs, gallops. Gastrointestinal system: Abdomen is nondistended, soft and nontender. No organomegaly or masses felt. Normal bowel sounds heard. Central nervous system: Alert and oriented. No focal neurological deficits. Extremities: No  C/C/E, +pedal pulses Skin: No rashes, lesions or ulcers Psychiatry: Judgement and insight appear normal. Mood & affect appropriate.       Data Reviewed: I have personally reviewed following labs and imaging studies  CBC: Recent Labs  Lab 01/05/18 0954 01/06/18 0604 01/07/18 0651 01/09/18 0550  WBC 13.9* 11.4* 9.2 7.7  NEUTROABS 10.8*  --   --   --   HGB 11.9* 11.7* 11.2* 12.1*  HCT 35.1* 36.4* 35.0* 38.5*  MCV 86.9 89.4 90.7 90.6  PLT 121* 136* 126* 073   Basic Metabolic Panel: Recent Labs  Lab 01/05/18 0954 01/06/18 0604 01/07/18 0651 01/08/18 0607 01/09/18 0550  NA 135 138 136 135 134*  K 3.7 3.8 4.3 4.5 4.8  CL 95* 99* 103 103 103  CO2 27 27 25 23 23   GLUCOSE 93 78 83 91 80  BUN 39* 33* 24* 19 14  CREATININE 2.88* 2.25* 1.81* 1.55* 1.54*  CALCIUM 8.9 8.7* 8.6* 8.7* 8.6*  MG  --   --   --  1.9  --    GFR: Estimated Creatinine Clearance: 47 mL/min (A) (by C-G formula based on SCr of 1.54 mg/dL (H)). Liver Function Tests: Recent Labs  Lab 01/05/18 0954  AST 19  ALT 13*  ALKPHOS 94  BILITOT 0.6  PROT 6.1*  ALBUMIN 2.8*   Recent Labs  Lab 01/05/18 0954  LIPASE 27   Recent Labs  Lab 01/05/18 0954  AMMONIA <9*   Coagulation Profile: No results for input(s): INR, PROTIME in the last 168 hours. Cardiac Enzymes: No results for input(s): CKTOTAL, CKMB, CKMBINDEX, TROPONINI in the last 168 hours. BNP (last 3 results) No results for input(s): PROBNP in the last 8760 hours. HbA1C: No results for input(s): HGBA1C in the last 72 hours. CBG: No results for input(s): GLUCAP in the last 168 hours. Lipid Profile: No results for input(s): CHOL, HDL, LDLCALC, TRIG, CHOLHDL, LDLDIRECT in the last 72 hours. Thyroid Function Tests: No results for input(s): TSH, T4TOTAL, FREET4, T3FREE, THYROIDAB in the last 72 hours. Anemia Panel: No results for input(s): VITAMINB12, FOLATE, FERRITIN, TIBC, IRON, RETICCTPCT in the last 72 hours. Urine analysis:      Component Value Date/Time   COLORURINE YELLOW 01/05/2018 [place] 01/05/2018 1433   LABSPEC 1.012 01/05/2018 1433   PHURINE 5.0 01/05/2018 1433   GLUCOSEU NEGATIVE 01/05/2018 1433   HGBUR NEGATIVE 01/05/2018 1433   BILIRUBINUR NEGATIVE 01/05/2018 1433   KETONESUR NEGATIVE 01/05/2018 1433   PROTEINUR 30 (A) 01/05/2018 1433   UROBILINOGEN 0.2 11/12/2013 2315   NITRITE NEGATIVE 01/05/2018 1433   LEUKOCYTESUR TRACE (A) 01/05/2018 1433   Sepsis Labs: @LABRCNTIP (procalcitonin:4,lacticidven:4)  )No results found for this or any previous visit (from the past 240 hour(s)).       Radiology Studies: Dg Esophagus  Result Date: 01/09/2018 CLINICAL DATA:  Dysphagia, vomiting EXAM: ESOPHOGRAM/BARIUM SWALLOW TECHNIQUE: Single contrast examination was performed using  thin barium. FLUOROSCOPY TIME:  Fluoroscopy Time:  2 min 6 sec Radiation Exposure Index (if provided by the fluoroscopic device): Number of Acquired Spot Images: 0 COMPARISON:  None. FINDINGS: Fluoroscopic evaluation of swallowing  demonstrates disruption of primary esophageal waves. There is persistent narrowing within the distal esophagus. 13 mm barium tablet sticks in this area. No other areas of esophageal stricture or narrowing. No fold thickening. IMPRESSION: Moderate narrowing within the distal esophagus, smooth and long segment. This could be related to reflux stricture. The 13 mm barium tablet sticks in this area. Esophageal dysmotility. Electronically Signed   By: Rolm Baptise M.D.   On: 01/09/2018 10:27        Scheduled Meds: . albuterol  2.5 mg Nebulization Q6H  . allopurinol  100 mg Oral Daily  . amoxicillin-clavulanate  400 mg of amoxicillin Oral BID WC  . aspirin  81 mg Oral Daily  . atorvastatin  80 mg Oral q1800  . carvedilol  3.125 mg Oral BID WC  . cholecalciferol  400 Units Oral BID  . clopidogrel  75 mg Oral Daily  . feeding supplement  237 mL Oral QID  . folic acid  1 mg Oral Daily  .  heparin  5,000 Units Subcutaneous Q8H  . levothyroxine  25 mcg Intravenous QAC breakfast  . metoCLOPramide (REGLAN) injection  5 mg Intravenous TID AC  . ondansetron (ZOFRAN) IV  4 mg Intravenous TID AC  . pantoprazole  40 mg Oral BID AC   Continuous Infusions: . 0.9 % NaCl with KCl 20 mEq / L 75 mL/hr at 01/10/18 0654     LOS: 5 days    Time spent: 25 minutes. Greater than 50% of this time was spent in direct contact with the patient coordinating care.     Lelon Frohlich, MD [facility] Pager [phone]  If 7PM-7AM, please contact night-coverage [email or web address] Password [facility] 01/10/2018, 6:05 PM

## 2018-01-10 NOTE — Progress Notes (Signed)
Esophageal manometry done per protocol.  Patient tolerated procedure but had a drop in oxygen saturation to 84/85.  Patient was placed on 4LNC with oxygen back up to 92. Patient remained in afib during procedure with frequent pvcs.  Rhythm was the same before and after the procedure. Patient remained on [facility] after procedure and was on 2L when transported by [facility] back to [facility].  Report sent to Dr Harl Bowie.

## 2018-01-10 NOTE — Progress Notes (Signed)
Patient scheduled for Manometry and pH study.  Per Dr Marlynn Perking patient only needs manometry done.  Order changed.  Patient also known to have eaten 4 oz vanilla ice cream between 0800 and 0830.  Per Dr. Oneida Alar go ahead with study now.

## 2018-01-11 DIAGNOSIS — R05 Cough: Secondary | ICD-10-CM

## 2018-01-11 DIAGNOSIS — R1111 Vomiting without nausea: Secondary | ICD-10-CM

## 2018-01-11 MED ORDER — ALBUTEROL SULFATE (2.5 MG/3ML) 0.083% IN NEBU
2.5000 mg | INHALATION_SOLUTION | RESPIRATORY_TRACT | Status: DC | PRN
Start: 2018-01-11 — End: 2018-01-15

## 2018-01-11 MED ORDER — IPRATROPIUM-ALBUTEROL 0.5-2.5 (3) MG/3ML IN SOLN
3.0000 mL | Freq: Four times a day (QID) | RESPIRATORY_TRACT | Status: DC
  Administered 2018-01-11 – 2018-01-15 (×16): 3 mL via RESPIRATORY_TRACT
  Filled 2018-01-11 (×17): qty 3

## 2018-01-11 MED ORDER — IPRATROPIUM BROMIDE 0.02 % IN SOLN
RESPIRATORY_TRACT | Status: AC
  Administered 2018-01-11: 1 mg
  Filled 2018-01-11: qty 2.5

## 2018-01-11 NOTE — Progress Notes (Signed)
Patient continues to have difficulty swallowing. Recent cough and new infiltrates on chest x-ray.  Manometry has been done;  final report not available for review.   Vital signs in last 24 hours: Temp:  [97.5 F (36.4 C)-98.4 F (36.9 C)] 97.5 F (36.4 C) (02/22 2100) Pulse Rate:  [85-108] 85 (02/22 2100) Resp:  [18] 18 (02/22 2100) BP: (105-129)/(72-86) 129/72 (02/23 0500) SpO2:  [93 %-98 %] 96 % (02/23 0658) Weight:  [234 lb 2.1 oz (106.2 kg)] 234 lb 2.1 oz (106.2 kg) (02/23 0500) Last BM Date: 01/07/18 General:   Alert,   pleasant and cooperative in NAD.  Lying nearly flat in bed. Abdomen:  Nondistended. Positive bowel sounds. Soft and nontender. Extremities:  Without clubbing or edema.    Intake/Output from previous day: 02/22 0701 - 02/23 0700 In: 1200 [I.V.:1200] Out: 900 [Urine:900] Intake/Output this shift: No intake/output data recorded.  Lab Results: Recent Labs    01/09/18 0550  WBC 7.7  HGB 12.1*  HCT 38.5*  PLT 152   BMET Recent Labs    01/09/18 0550  NA 134*  K 4.8  CL 103  CO2 23  GLUCOSE 80  BUN 14  CREATININE 1.54*  CALCIUM 8.6*   LFT No results for input(s): PROT, ALBUMIN, AST, ALT, ALKPHOS, BILITOT, BILIDIR, IBILI in the last 72 hours. PT/INR No results for input(s): LABPROT, INR in the last 72 hours. Hepatitis Panel No results for input(s): HEPBSAG, HCVAB, HEPAIGM, HEPBIGM in the last 72 hours. C-Diff No results for input(s): CDIFFTOX in the last 72 hours.  Studies/Results: Dg Chest Port 1 View  Result Date: 01/10/2018 CLINICAL DATA:  Productive cough as of today. Hx of a-fib, CAD, HTN. EXAM: PORTABLE CHEST - 1 VIEW COMPARISON:  01/05/2018 FINDINGS: Some increase in perihilar and infrahilar consolidation/atelectasis, right greater than left. Chronic blunting of the lateral costophrenic angles. Heart size upper limits normal. Aortic Atherosclerosis (ICD10-170.0) No pneumothorax. Visualized bones unremarkable. IMPRESSION: New perihilar  and infrahilar airspace disease, right worse than left. Electronically Signed   By: Lucrezia Europe M.D.   On: 01/10/2018 19:44    Impression:  Ongoing dysphagia with abnormal barium pill esophagram. Cough and new infiltrates-probable aspiration pneumonia. Official manometry results pending.  Most recent EGD performed by me revealed circumferential ulcerative esophagitis at the GE junction. Biopsies revealed acute ulcer.  I reviewed noncontrast CT from November with Dr. Derry Lory today. Esophagus nondilated. No adenopathy or extrinsic mass. The distal one third of the thoracic esophagus was well seen on the abdominal CT.  Last 3 esophageal dilations did not help his dysphagia. Pylorus was patent and his most recent EGD.   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 Likely pill induced/chemical esophagitis at EG junction due to stasis at EGD earlier this month .   Recommendations: Unsafe to advance diet further at this time. Will review manometry report when it becomes available.  I admonished nursing staff to keep head of bed elevated 45 at all times. When he is attempting to eat or drink should be sitting upright and should remain upright for 30 minutes afterwards.  I suspect he will need to be referred to [facility] for further evaluation regardless of manometry findings. If achalasia confirmed, may be worthwhile to try Botox. He is a poor esophagomyotomy candidate. Endoscopic balloon dilation with a goal of rupturing the LES would also be a risky proposition.  Further recommendations to follow.     LOS: 6 days   Manus Rudd  01/11/2018, 2:37 PM

## 2018-01-11 NOTE — Progress Notes (Signed)
PROGRESS NOTE    Justin Parsons  IRC:[id number] DOB: 10/21/1941 DOA: 01/05/2018 PCP: Monico Blitz, MD     Brief Narrative:  77 year old man admitted from home on 2/17 with a one week history of intractable nausea and vomiting.  He has a history of Nash plus EtOH cirrhosis, esophageal stricture and pyloric stenosis.  He has waxed and waned throughout his hospital course.  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  Dr. Oneida Alar has arranged for patient to have manometry/impedance testing on 2/22, results are currently pending.  Has developed aspiration pneumonia.  Assessment & Plan:   Active Problems:   Cirrhosis (HCC)   Thrombocytopenia (HCC)   Hypothyroidism   Dysphagia, pharyngoesophageal phase   Esophageal stricture   Acute renal failure superimposed on stage 3 chronic kidney disease (HCC)   Intractable vomiting with nausea   Gastric outlet obstruction   Acute on chronic renal failure (HCC)   Dysphagia   Intractable nausea and vomiting/dysphagia -Has had esophageal dilatation x3 since September 2018, however dysphagia persists. -Barium pill esophagus  shows moderate narrowing within the distal esophagus.  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 -Patient has had manometry/impedance studies on 2/22, results are pending. -Nausea and vomiting have improved.   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 -Continue Augmentin.  Acute on chronic kidney disease stage III -Creatinine on admission was 2.8, baseline creatinine is around 1.8, creatinine is now down to his baseline of around 1.5. -Lasix on hold.  Chronic atrial fibrillation -Rate controlled on Coreg. -Poor candidate for anticoagulation secondary to liver cirrhosis and portal hypertensive gastropathy.  Coronary artery disease -No chest pain.  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   DVT prophylaxis: SCDs Code Status: Full code Family Communication: Patient only Disposition Plan: Await results of manometry testing  Consultants:   GI  Procedures:   None  Antimicrobials:  Anti-infectives (From admission, onward)   Start     Dose/Rate Route Frequency Ordered Stop   01/10/18 1800  amoxicillin-clavulanate (AUGMENTIN) 400-57 MG/5ML suspension 400 mg  Status:  Discontinued     400 mg Oral 2 times daily with meals 01/10/18 1627 01/10/18 1716   01/10/18 1800  amoxicillin-clavulanate (AUGMENTIN) 200-28.5 MG/5ML suspension 400 mg     400 mg of amoxicillin Oral 2 times daily with meals 01/10/18 1716         Subjective: Sitting at side of bed, has no complaints, coughs intermittently throughout my exam.  Objective: Vitals:   01/11/18 0105 01/11/18 0500 01/11/18 0658 01/11/18 1501  BP:  129/72    Pulse:      Resp:      Temp:      TempSrc:      SpO2: 96% 97% 96% (!) 87%  Weight:  106.2 kg (234 lb 2.1 oz)    Height:        Intake/Output Summary (Last 24 hours) at 01/11/2018 1657 Last data filed at 01/11/2018 5102 Gross per 24 hour  Intake 1200 ml  Output 900 ml  Net 300 ml   Filed Weights   01/09/18 0500 01/10/18 0600 01/11/18 0500  Weight: 102.9 kg (226 lb 13.7 oz) 104.2 kg (229 lb 11.5 oz) 106.2 kg (234 lb 2.1 oz)    Examination:  General exam: Alert, awake, oriented x 3 Respiratory system: Coarse bilateral breath sounds, no wheezes Cardiovascular system:RRR. No murmurs, rubs,  gallops. Gastrointestinal system: Abdomen is nondistended, soft and nontender. No organomegaly or masses felt. Normal bowel sounds heard. Central nervous system: Alert and oriented. No focal neurological deficits. Extremities: No C/C/E, +pedal pulses Skin: No rashes, lesions or ulcers Psychiatry: Judgement and insight appear normal. Mood & affect appropriate.        Data Reviewed: I have  personally reviewed following labs and imaging studies  CBC: Recent Labs  Lab 01/05/18 0954 01/06/18 0604 01/07/18 0651 01/09/18 0550  WBC 13.9* 11.4* 9.2 7.7  NEUTROABS 10.8*  --   --   --   HGB 11.9* 11.7* 11.2* 12.1*  HCT 35.1* 36.4* 35.0* 38.5*  MCV 86.9 89.4 90.7 90.6  PLT 121* 136* 126* 762   Basic Metabolic Panel: Recent Labs  Lab 01/05/18 0954 01/06/18 0604 01/07/18 0651 01/08/18 0607 01/09/18 0550  NA 135 138 136 135 134*  K 3.7 3.8 4.3 4.5 4.8  CL 95* 99* 103 103 103  CO2 27 27 25 23 23   GLUCOSE 93 78 83 91 80  BUN 39* 33* 24* 19 14  CREATININE 2.88* 2.25* 1.81* 1.55* 1.54*  CALCIUM 8.9 8.7* 8.6* 8.7* 8.6*  MG  --   --   --  1.9  --    GFR: Estimated Creatinine Clearance: 47.5 mL/min (A) (by C-G formula based on SCr of 1.54 mg/dL (H)). Liver Function Tests: Recent Labs  Lab 01/05/18 0954  AST 19  ALT 13*  ALKPHOS 94  BILITOT 0.6  PROT 6.1*  ALBUMIN 2.8*   Recent Labs  Lab 01/05/18 0954  LIPASE 27   Recent Labs  Lab 01/05/18 0954  AMMONIA <9*   Coagulation Profile: No results for input(s): INR, PROTIME in the last 168 hours. Cardiac Enzymes: No results for input(s): CKTOTAL, CKMB, CKMBINDEX, TROPONINI in the last 168 hours. BNP (last 3 results) No results for input(s): PROBNP in the last 8760 hours. HbA1C: No results for input(s): HGBA1C in the last 72 hours. CBG: No results for input(s): GLUCAP in the last 168 hours. Lipid Profile: No results for input(s): CHOL, HDL, LDLCALC, TRIG, CHOLHDL, LDLDIRECT in the last 72 hours. Thyroid Function Tests: No results for input(s): TSH, T4TOTAL, FREET4, T3FREE, THYROIDAB in the last 72 hours. Anemia Panel: No results for input(s): VITAMINB12, FOLATE, FERRITIN, TIBC, IRON, RETICCTPCT in the last 72 hours. Urine analysis:    Component Value Date/Time   COLORURINE YELLOW 01/05/2018 [place] 01/05/2018 1433   LABSPEC 1.012 01/05/2018 1433   PHURINE 5.0 01/05/2018 1433    GLUCOSEU NEGATIVE 01/05/2018 1433   HGBUR NEGATIVE 01/05/2018 1433   BILIRUBINUR NEGATIVE 01/05/2018 1433   KETONESUR NEGATIVE 01/05/2018 1433   PROTEINUR 30 (A) 01/05/2018 1433   UROBILINOGEN 0.2 11/12/2013 2315   NITRITE NEGATIVE 01/05/2018 1433   LEUKOCYTESUR TRACE (A) 01/05/2018 1433   Sepsis Labs: @LABRCNTIP (procalcitonin:4,lacticidven:4)  )No results found for this or any previous visit (from the past 240 hour(s)).       Radiology Studies: Dg Chest Port 1 View  Result Date: 01/10/2018 CLINICAL DATA:  Productive cough as of today. Hx of a-fib, CAD, HTN. EXAM: PORTABLE CHEST - 1 VIEW COMPARISON:  01/05/2018 FINDINGS: Some increase in perihilar and infrahilar consolidation/atelectasis, right greater than left. Chronic blunting of the lateral costophrenic angles. Heart size upper limits normal. Aortic Atherosclerosis (ICD10-170.0) No pneumothorax. Visualized bones unremarkable. IMPRESSION: New perihilar and infrahilar airspace disease, right worse than left. Electronically Signed   By: Lucrezia Europe M.D.   On: 01/10/2018 19:44  Scheduled Meds: . allopurinol  100 mg Oral Daily  . amoxicillin-clavulanate  400 mg of amoxicillin Oral BID WC  . aspirin  81 mg Oral Daily  . atorvastatin  80 mg Oral q1800  . carvedilol  3.125 mg Oral BID WC  . cholecalciferol  400 Units Oral BID  . clopidogrel  75 mg Oral Daily  . feeding supplement  237 mL Oral QID  . folic acid  1 mg Oral Daily  . heparin  5,000 Units Subcutaneous Q8H  . ipratropium-albuterol  3 mL Nebulization Q6H  . levothyroxine  25 mcg Intravenous QAC breakfast  . metoCLOPramide (REGLAN) injection  5 mg Intravenous TID AC  . ondansetron (ZOFRAN) IV  4 mg Intravenous TID AC  . pantoprazole  40 mg Oral BID AC   Continuous Infusions: . 0.9 % NaCl with KCl 20 mEq / L 75 mL/hr at 01/11/18 0646     LOS: 6 days    Time spent: 25 minutes. Greater than 50% of this time was spent in direct contact with the patient  coordinating care.     Lelon Frohlich, MD [facility] Pager [phone]  If 7PM-7AM, please contact night-coverage [email or web address] Password [facility] 01/11/2018, 4:57 PM

## 2018-01-12 DIAGNOSIS — L8990 Pressure ulcer of unspecified site, unspecified stage: Secondary | ICD-10-CM | POA: Diagnosis present

## 2018-01-12 LAB — CBC
HCT: 37 % — ABNORMAL LOW (ref 39.0–52.0)
Hemoglobin: 11.6 g/dL — ABNORMAL LOW (ref 13.0–17.0)
MCH: 28.4 pg (ref 26.0–34.0)
MCHC: 31.4 g/dL (ref 30.0–36.0)
MCV: 90.7 fL (ref 78.0–100.0)
PLATELETS: 182 10*3/uL (ref 150–400)
RBC: 4.08 MIL/uL — ABNORMAL LOW (ref 4.22–5.81)
RDW: 17.1 % — ABNORMAL HIGH (ref 11.5–15.5)
WBC: 9.6 10*3/uL (ref 4.0–10.5)

## 2018-01-12 LAB — BASIC METABOLIC PANEL
Anion gap: 6 (ref 5–15)
BUN: 10 mg/dL (ref 6–20)
CALCIUM: 8.8 mg/dL — AB (ref 8.9–10.3)
CHLORIDE: 107 mmol/L (ref 101–111)
CO2: 24 mmol/L (ref 22–32)
Creatinine, Ser: 1.34 mg/dL — ABNORMAL HIGH (ref 0.61–1.24)
GFR calc Af Amer: 57 mL/min — ABNORMAL LOW (ref >60)
GFR calc non Af Amer: 49 mL/min — ABNORMAL LOW (ref >60)
GLUCOSE: 79 mg/dL (ref 65–99)
Potassium: 5.1 mmol/L (ref 3.5–5.1)
Sodium: 137 mmol/L (ref 135–145)

## 2018-01-12 NOTE — Progress Notes (Signed)
PROGRESS NOTE    Justin Parsons  ZLD:[id number] DOB: 1941/03/07 DOA: 01/05/2018 PCP: Monico Blitz, MD     Brief Narrative:  77 year old man admitted from home on 2/17 with a one week history of intractable nausea and vomiting.  He has a history of Nash plus EtOH cirrhosis, esophageal stricture and pyloric stenosis.  He has waxed and waned throughout his hospital course.  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  Dr. Oneida Alar has arranged for patient to have manometry/impedance testing on 2/22, results are currently pending.  Has developed aspiration pneumonia.  Assessment & Plan:   Active Problems:   Cirrhosis (HCC)   Thrombocytopenia ([place])   Hypothyroidism   Dysphagia, pharyngoesophageal phase   Esophageal stricture   Acute renal failure superimposed on stage 3 chronic kidney disease (HCC)   Intractable vomiting with nausea   Gastric outlet obstruction   Acute on chronic renal failure (HCC)   Dysphagia   Pressure injury of skin   Intractable nausea and vomiting/dysphagia -Has had esophageal dilatation x3 since September 2018, however dysphagia persists. -Barium pill esophagus  shows moderate narrowing within the distal esophagus.  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 -Patient has had manometry/impedance studies on 2/22, results are pending. -Nausea and vomiting have improved.   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 -Continue Augmentin.  Acute on chronic kidney disease stage III -Creatinine on admission was 2.8, baseline creatinine is around 1.8, creatinine is now better than baseline of around 1.34. -Lasix on hold.  Chronic atrial fibrillation -Rate controlled on Coreg. -Poor candidate for anticoagulation secondary to liver cirrhosis and portal hypertensive gastropathy.  Coronary artery disease -No chest pain.  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   DVT prophylaxis: SCDs Code Status: Full code Family Communication: Patient only Disposition Plan: Await results of manometry testing; will likely need referral to [facility], unclear if inpatient versus outpatient.  Consultants:   GI  Procedures:   Manometry studies on 2/22.  Antimicrobials:  Anti-infectives (From admission, onward)   Start     Dose/Rate Route Frequency Ordered Stop   01/10/18 1800  amoxicillin-clavulanate (AUGMENTIN) 400-57 MG/5ML suspension 400 mg  Status:  Discontinued     400 mg Oral 2 times daily with meals 01/10/18 1627 01/10/18 1716   01/10/18 1800  amoxicillin-clavulanate (AUGMENTIN) 200-28.5 MG/5ML suspension 400 mg     400 mg of amoxicillin Oral 2 times daily with meals 01/10/18 1716         Subjective: Still with significant coughing with productive sputum.  Objective: Vitals:   01/12/18 0222 01/12/18 0700 01/12/18 0734 01/12/18 1422  BP:  106/71    Pulse:  87    Resp:  18    Temp:  98.2 F (36.8 C)    TempSrc:  Oral    SpO2: 98% 99% 96% 94%  Weight:  104.8 kg (231 lb 0.7 oz)    Height:        Intake/Output Summary (Last 24 hours) at 01/12/2018 1618 Last data filed at 01/12/2018 0156 Gross per 24 hour  Intake 480 ml  Output 600 ml  Net -120 ml   Filed Weights   01/10/18 0600 01/11/18 0500 01/12/18 0700  Weight: 104.2 kg (229 lb 11.5 oz) 106.2 kg (234 lb 2.1 oz) 104.8 kg (231 lb 0.7 oz)    Examination:  General exam: Alert, awake, oriented  x 3 Respiratory system: Coarse bilateral breath sounds Cardiovascular system:RRR. No murmurs, rubs, gallops. Gastrointestinal system: Abdomen is nondistended, soft and nontender. No organomegaly or masses felt. Normal bowel sounds heard. Central nervous system: Alert and oriented. No focal neurological deficits. Extremities: No C/C/E, +pedal pulses Skin: No rashes,  lesions or ulcers Psychiatry: Judgement and insight appear normal. Mood & affect appropriate.         Data Reviewed: I have personally reviewed following labs and imaging studies  CBC: Recent Labs  Lab 01/06/18 0604 01/07/18 0651 01/09/18 0550 01/12/18 0621  WBC 11.4* 9.2 7.7 9.6  HGB 11.7* 11.2* 12.1* 11.6*  HCT 36.4* 35.0* 38.5* 37.0*  MCV 89.4 90.7 90.6 90.7  PLT 136* 126* 152 163   Basic Metabolic Panel: Recent Labs  Lab 01/06/18 0604 01/07/18 0651 01/08/18 0607 01/09/18 0550 01/12/18 0621  NA 138 136 135 134* 137  K 3.8 4.3 4.5 4.8 5.1  CL 99* 103 103 103 107  CO2 27 25 23 23 24   GLUCOSE 78 83 91 80 79  BUN 33* 24* 19 14 10   CREATININE 2.25* 1.81* 1.55* 1.54* 1.34*  CALCIUM 8.7* 8.6* 8.7* 8.6* 8.8*  MG  --   --  1.9  --   --    GFR: Estimated Creatinine Clearance: 54.2 mL/min (A) (by C-G formula based on SCr of 1.34 mg/dL (H)). Liver Function Tests: No results for input(s): AST, ALT, ALKPHOS, BILITOT, PROT, ALBUMIN in the last 168 hours. No results for input(s): LIPASE, AMYLASE in the last 168 hours. No results for input(s): AMMONIA in the last 168 hours. Coagulation Profile: No results for input(s): INR, PROTIME in the last 168 hours. Cardiac Enzymes: No results for input(s): CKTOTAL, CKMB, CKMBINDEX, TROPONINI in the last 168 hours. BNP (last 3 results) No results for input(s): PROBNP in the last 8760 hours. HbA1C: No results for input(s): HGBA1C in the last 72 hours. CBG: No results for input(s): GLUCAP in the last 168 hours. Lipid Profile: No results for input(s): CHOL, HDL, LDLCALC, TRIG, CHOLHDL, LDLDIRECT in the last 72 hours. Thyroid Function Tests: No results for input(s): TSH, T4TOTAL, FREET4, T3FREE, THYROIDAB in the last 72 hours. Anemia Panel: No results for input(s): VITAMINB12, FOLATE, FERRITIN, TIBC, IRON, RETICCTPCT in the last 72 hours. Urine analysis:    Component Value Date/Time   COLORURINE YELLOW 01/05/2018 [place] 01/05/2018 1433   LABSPEC 1.012 01/05/2018 1433   PHURINE 5.0 01/05/2018 1433   GLUCOSEU NEGATIVE 01/05/2018 1433   HGBUR NEGATIVE 01/05/2018 1433   BILIRUBINUR NEGATIVE 01/05/2018 1433   KETONESUR NEGATIVE 01/05/2018 1433   PROTEINUR 30 (A) 01/05/2018 1433   UROBILINOGEN 0.2 11/12/2013 2315   NITRITE NEGATIVE 01/05/2018 1433   LEUKOCYTESUR TRACE (A) 01/05/2018 1433   Sepsis Labs: @LABRCNTIP (procalcitonin:4,lacticidven:4)  )No results found for this or any previous visit (from the past 240 hour(s)).       Radiology Studies: Dg Chest Port 1 View  Result Date: 01/10/2018 CLINICAL DATA:  Productive cough as of today. Hx of a-fib, CAD, HTN. EXAM: PORTABLE CHEST - 1 VIEW COMPARISON:  01/05/2018 FINDINGS: Some increase in perihilar and infrahilar consolidation/atelectasis, right greater than left. Chronic blunting of the lateral costophrenic angles. Heart size upper limits normal. Aortic Atherosclerosis (ICD10-170.0) No pneumothorax. Visualized bones unremarkable. IMPRESSION: New perihilar and infrahilar airspace disease, right worse than left. Electronically Signed   By: Lucrezia Europe M.D.   On: 01/10/2018 19:44        Scheduled Meds: . allopurinol  100 mg Oral Daily  . amoxicillin-clavulanate  400 mg of amoxicillin Oral BID WC  . aspirin  81 mg Oral Daily  . atorvastatin  80 mg Oral q1800  . carvedilol  3.125 mg Oral BID WC  . cholecalciferol  400 Units Oral BID  . clopidogrel  75 mg Oral Daily  . feeding supplement  237 mL Oral QID  . folic acid  1 mg Oral Daily  . heparin  5,000 Units Subcutaneous Q8H  . ipratropium-albuterol  3 mL Nebulization Q6H  . levothyroxine  25 mcg Intravenous QAC breakfast  . metoCLOPramide (REGLAN) injection  5 mg Intravenous TID AC  . ondansetron (ZOFRAN) IV  4 mg Intravenous TID AC  . pantoprazole  40 mg Oral BID AC   Continuous Infusions: . 0.9 % NaCl with KCl 20 mEq / L 75 mL/hr at 01/12/18 1034     LOS: 7 days     Time spent: 25 minutes. Greater than 50% of this time was spent in direct contact with the patient coordinating care.     Lelon Frohlich, MD [facility] Pager [phone]  If 7PM-7AM, please contact night-coverage [email or web address] Password [facility] 01/12/2018, 4:18 PM

## 2018-01-12 NOTE — Progress Notes (Signed)
Tolerating full liquids a little better today.  Upright when eating. Discussed with nursing staff.  Final manometry report not yet available.  Vital signs in last 24 hours: Temp:  [98.2 F (36.8 C)-98.3 F (36.8 C)] 98.2 F (36.8 C) (02/24 0700) Pulse Rate:  [87-88] 87 (02/24 0700) Resp:  [18] 18 (02/24 0700) BP: (102-106)/(66-71) 106/71 (02/24 0700) SpO2:  [87 %-100 %] 96 % (02/24 0734) Weight:  [231 lb 0.7 oz (104.8 kg)] 231 lb 0.7 oz (104.8 kg) (02/24 0700) Last BM Date: 01/11/18 General:   Alert,  pleasant and cooperative in NAD Abdomen:  Soft, nontender and nondistended.  Normal bowel sounds, without guarding, and without rebound.  No mass or organomegaly. Extremities:  Without clubbing or edema.    Intake/Output from previous day: 02/23 0701 - 02/24 0700 In: 480 [P.O.:480] Out: 800 [Urine:800] Intake/Output this shift: No intake/output data recorded.  Lab Results: Recent Labs    01/12/18 0621  WBC 9.6  HGB 11.6*  HCT 37.0*  PLT 182   BMET Recent Labs    01/12/18 0621  NA 137  K 5.1  CL 107  CO2 24  GLUCOSE 79  BUN 10  CREATININE 1.34*  CALCIUM 8.8*   LFT No results for input(s): PROT, ALBUMIN, AST, ALT, ALKPHOS, BILITOT, BILIDIR, IBILI in the last 72 hours. PT/INR No results for input(s): LABPROT, INR in the last 72 hours. Hepatitis Panel No results for input(s): HEPBSAG, HCVAB, HEPAIGM, HEPBIGM in the last 72 hours. C-Diff No results for input(s): CDIFFTOX in the last 72 hours.  Studies/Results: Dg Chest Port 1 View  Result Date: 01/10/2018 CLINICAL DATA:  Productive cough as of today. Hx of a-fib, CAD, HTN. EXAM: PORTABLE CHEST - 1 VIEW COMPARISON:  01/05/2018 FINDINGS: Some increase in perihilar and infrahilar consolidation/atelectasis, right greater than left. Chronic blunting of the lateral costophrenic angles. Heart size upper limits normal. Aortic Atherosclerosis (ICD10-170.0) No pneumothorax. Visualized bones unremarkable. IMPRESSION: New  perihilar and infrahilar airspace disease, right worse than left. Electronically Signed   By: Lucrezia Europe M.D.   On: 01/10/2018 19:44    Impression:  Persisting dysphagia-likely secondary to underlying esophageal motility disorder. Secondary aspiration pneumonia.  Recommendations:  Review manometry report when it becomes available. Plan for outpatient referral to [place] with swallowing precautions.

## 2018-01-13 ENCOUNTER — Telehealth: Payer: Self-pay | Admitting: Gastroenterology

## 2018-01-13 ENCOUNTER — Other Ambulatory Visit: Payer: Self-pay

## 2018-01-13 DIAGNOSIS — R1314 Dysphagia, pharyngoesophageal phase: Secondary | ICD-10-CM

## 2018-01-13 DIAGNOSIS — R1310 Dysphagia, unspecified: Secondary | ICD-10-CM

## 2018-01-13 MED ORDER — MORPHINE SULFATE (PF) 2 MG/ML IV SOLN
2.0000 mg | Freq: Once | INTRAVENOUS | Status: AC
  Administered 2018-01-13: 2 mg via INTRAVENOUS
  Filled 2018-01-13: qty 1

## 2018-01-13 NOTE — Telephone Encounter (Signed)
Routing to [facility] clinical pool: Patient is currently at [facility]. Please arrange an ASAP outpatient evaluation at [facility] for dysphagia, concern for achalasia. Thanks!

## 2018-01-13 NOTE — Progress Notes (Signed)
    Subjective: No dysphagia with full liquids. Denies abdominal pain.   Objective: Vital signs in last 24 hours: Temp:  [98.4 F (36.9 C)-98.8 F (37.1 C)] 98.8 F (37.1 C) (02/25 0647) Pulse Rate:  [99-105] 105 (02/25 0647) Resp:  [20] 20 (02/25 0647) BP: (115-121)/(84-85) 121/84 (02/25 0647) SpO2:  [94 %-98 %] 95 % (02/25 0724) Weight:  [233 lb 14.5 oz (106.1 kg)] 233 lb 14.5 oz (106.1 kg) (02/25 0647) Last BM Date: 01/11/18 General:   Alert and oriented, chronically-ill appearing  Abdomen:  Bowel sounds present, soft, obese, non-tender Neurologic:  Alert and  oriented x4;  Psych:  Alert and cooperative. Flat affect   Intake/Output from previous day: 02/24 0701 - 02/25 0700 In: 4425 [I.V.:4425] Out: 975 [Urine:975] Intake/Output this shift: Total I/O In: 360 [P.O.:360] Out: 600 [Urine:600]  Lab Results: Recent Labs    01/12/18 0621  WBC 9.6  HGB 11.6*  HCT 37.0*  PLT 182   BMET Recent Labs    01/12/18 0621  NA 137  K 5.1  CL 107  CO2 24  GLUCOSE 79  BUN 10  CREATININE 1.34*  CALCIUM 8.8*    Assessment: 77 year old male with history of NASH+/- ETOH cirrhosis, admitted with nausea, vomiting, dysphagia. He has had multiple dilations without significant improvement historically. Concern for achalasia, with manometry completed 2/22. Results unavailable. Now dealing with aspiration pneumonia. Will need referral to [facility] as outpatient. Will try to arrange this expeditiously. Will also attempt to make contact with Dr. Silverio Decamp to see status of results from manometry.   Plan: Continue full liquids Sit upright while eating Will go ahead and start process for [facility] outpatient referral in a timely manner Need manometry results: will attempt to make contact with Dr. Woodward Ku practice to determine turnaround for this Continue PPI BID Continue Reglan  Annitta Needs, PhD, ANP-BC [facility]    LOS: 8 days    01/13/2018, 10:41 AM

## 2018-01-13 NOTE — Telephone Encounter (Signed)
[facility] with referral. Appt scheduled for 01/20/18 at 3:30pm with Dr. Roney Mans. Called pt's phone number. Joe answered. He advised for me to call pt's emergency contact Deirdre Evener) to inform of appt. Called and informed Denman Pichardo of the appt. Gave him phone number to [facility] GI dept for directions. Pt will be receiving packet in the mail.

## 2018-01-13 NOTE — Progress Notes (Addendum)
Discussed with Dr. Silverio Decamp manometry results. Results have now been scanned into epic. Manometry with EG junction outflow obstruction, findings not consistent with achalasia.

## 2018-01-13 NOTE — Progress Notes (Signed)
PROGRESS NOTE    Justin Parsons  YYT:[id number] DOB: 11-25-1940 DOA: 01/05/2018 PCP: Monico Blitz, MD     Brief Narrative:  77 year old man admitted from home on 2/17 with a one week history of intractable nausea and vomiting.  He has a history of Nash plus EtOH cirrhosis, esophageal stricture and pyloric stenosis.  He has waxed and waned throughout his hospital course.  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  Dr. Oneida Alar has arranged for patient to have manometry/impedance testing on 2/22, results are currently pending.  Has developed aspiration pneumonia.  Assessment & Plan:   Active Problems:   Cirrhosis (HCC)   Thrombocytopenia ([place])   Hypothyroidism   Dysphagia, pharyngoesophageal phase   Esophageal stricture   Acute renal failure superimposed on stage 3 chronic kidney disease (HCC)   Intractable vomiting with nausea   Gastric outlet obstruction   Acute on chronic renal failure (HCC)   Dysphagia   Pressure injury of skin   Intractable nausea and vomiting/dysphagia -Has had esophageal dilatation x3 since September 2018, however dysphagia persists. -Barium pill esophagus  shows moderate narrowing within the distal esophagus.  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 -Patient has had manometry/impedance studies on 2/22, results are pending.  GI here will call GI at [facility] to inquire about results. -Nausea and vomiting have improved.   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 -Continue Augmentin. -We will ask speech therapy to see if they have any diet recommendations for him.  Acute on chronic kidney disease stage III -Creatinine on admission was 2.8, baseline creatinine is around 1.8, creatinine is now better than baseline of around 1.34. -Lasix on hold.  Chronic atrial fibrillation -Rate controlled on Coreg. -Poor candidate for anticoagulation secondary to liver cirrhosis and portal  hypertensive gastropathy.  Coronary artery disease -No chest pain.  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   DVT prophylaxis: SCDs Code Status: Full code Family Communication: Patient only Disposition Plan: Await results of manometry testing; will likely need referral to [facility], unclear if inpatient versus outpatient. GI following  Consultants:   GI  Procedures:   Manometry studies on 2/22.  Antimicrobials:  Anti-infectives (From admission, onward)   Start     Dose/Rate Route Frequency Ordered Stop   01/10/18 1800  amoxicillin-clavulanate (AUGMENTIN) 400-57 MG/5ML suspension 400 mg  Status:  Discontinued     400 mg Oral 2 times daily with meals 01/10/18 1627 01/10/18 1716   01/10/18 1800  amoxicillin-clavulanate (AUGMENTIN) 200-28.5 MG/5ML suspension 400 mg     400 mg of amoxicillin Oral 2 times daily with meals 01/10/18 1716         Subjective: Improved, still c/o significant congestion.  Objective: Vitals:   01/13/18 0106 01/13/18 0647 01/13/18 0724 01/13/18 1325  BP:  121/84  120/75  Pulse:  (!) 105  87  Resp:  20  20  Temp:  98.8 F (37.1 C)  (!) 97.5 F (36.4 C)  TempSrc:  Oral  Oral  SpO2: 97% 98% 95% 100%  Weight:  106.1 kg (233 lb 14.5 oz)    Height:        Intake/Output Summary (Last 24 hours) at 01/13/2018 1401 Last data filed at 01/13/2018 1200 Gross per 24 hour  Intake 5025 ml  Output 1575 ml  Net 3450 ml   Filed Weights   01/11/18 0500 01/12/18  0700 01/13/18 0647  Weight: 106.2 kg (234 lb 2.1 oz) 104.8 kg (231 lb 0.7 oz) 106.1 kg (233 lb 14.5 oz)    Examination:  General exam: Alert, awake, oriented x 3 Respiratory system: Coarse bilateral breath sounds Cardiovascular system:RRR. No murmurs, rubs, gallops. Gastrointestinal system: Abdomen is nondistended, soft and nontender. No organomegaly  or masses felt. Normal bowel sounds heard. Central nervous system: Alert and oriented. No focal neurological deficits. Extremities: No C/C/E, +pedal pulses Skin: No rashes, lesions or ulcers Psychiatry: Judgement and insight appear normal. Mood & affect appropriate.          Data Reviewed: I have personally reviewed following labs and imaging studies  CBC: Recent Labs  Lab 01/07/18 0651 01/09/18 0550 01/12/18 0621  WBC 9.2 7.7 9.6  HGB 11.2* 12.1* 11.6*  HCT 35.0* 38.5* 37.0*  MCV 90.7 90.6 90.7  PLT 126* 152 211   Basic Metabolic Panel: Recent Labs  Lab 01/07/18 0651 01/08/18 0607 01/09/18 0550 01/12/18 0621  NA 136 135 134* 137  K 4.3 4.5 4.8 5.1  CL 103 103 103 107  CO2 25 23 23 24   GLUCOSE 83 91 80 79  BUN 24* 19 14 10   CREATININE 1.81* 1.55* 1.54* 1.34*  CALCIUM 8.6* 8.7* 8.6* 8.8*  MG  --  1.9  --   --    GFR: Estimated Creatinine Clearance: 54.6 mL/min (A) (by C-G formula based on SCr of 1.34 mg/dL (H)). Liver Function Tests: No results for input(s): AST, ALT, ALKPHOS, BILITOT, PROT, ALBUMIN in the last 168 hours. No results for input(s): LIPASE, AMYLASE in the last 168 hours. No results for input(s): AMMONIA in the last 168 hours. Coagulation Profile: No results for input(s): INR, PROTIME in the last 168 hours. Cardiac Enzymes: No results for input(s): CKTOTAL, CKMB, CKMBINDEX, TROPONINI in the last 168 hours. BNP (last 3 results) No results for input(s): PROBNP in the last 8760 hours. HbA1C: No results for input(s): HGBA1C in the last 72 hours. CBG: No results for input(s): GLUCAP in the last 168 hours. Lipid Profile: No results for input(s): CHOL, HDL, LDLCALC, TRIG, CHOLHDL, LDLDIRECT in the last 72 hours. Thyroid Function Tests: No results for input(s): TSH, T4TOTAL, FREET4, T3FREE, THYROIDAB in the last 72 hours. Anemia Panel: No results for input(s): VITAMINB12, FOLATE, FERRITIN, TIBC, IRON, RETICCTPCT in the last 72 hours. Urine  analysis:    Component Value Date/Time   COLORURINE YELLOW 01/05/2018 [place] 01/05/2018 1433   LABSPEC 1.012 01/05/2018 1433   PHURINE 5.0 01/05/2018 1433   GLUCOSEU NEGATIVE 01/05/2018 1433   HGBUR NEGATIVE 01/05/2018 1433   BILIRUBINUR NEGATIVE 01/05/2018 1433   KETONESUR NEGATIVE 01/05/2018 1433   PROTEINUR 30 (A) 01/05/2018 1433   UROBILINOGEN 0.2 11/12/2013 2315   NITRITE NEGATIVE 01/05/2018 1433   LEUKOCYTESUR TRACE (A) 01/05/2018 1433   Sepsis Labs: @LABRCNTIP (procalcitonin:4,lacticidven:4)  )No results found for this or any previous visit (from the past 240 hour(s)).       Radiology Studies: No results found.      Scheduled Meds: . allopurinol  100 mg Oral Daily  . amoxicillin-clavulanate  400 mg of amoxicillin Oral BID WC  . aspirin  81 mg Oral Daily  . atorvastatin  80 mg Oral q1800  . carvedilol  3.125 mg Oral BID WC  . cholecalciferol  400 Units Oral BID  . clopidogrel  75 mg Oral Daily  . feeding supplement  237 mL Oral QID  . folic acid  1 mg Oral  Daily  . heparin  5,000 Units Subcutaneous Q8H  . ipratropium-albuterol  3 mL Nebulization Q6H  . levothyroxine  25 mcg Intravenous QAC breakfast  . metoCLOPramide (REGLAN) injection  5 mg Intravenous TID AC  . ondansetron (ZOFRAN) IV  4 mg Intravenous TID AC  . pantoprazole  40 mg Oral BID AC   Continuous Infusions: . 0.9 % NaCl with KCl 20 mEq / L 75 mL/hr at 01/13/18 1258     LOS: 8 days    Time spent: 25 minutes. Greater than 50% of this time was spent in direct contact with the patient coordinating care.     Lelon Frohlich, MD [facility] Pager [phone]  If 7PM-7AM, please contact night-coverage [email or web address] Password [facility] 01/13/2018, 2:01 PM

## 2018-01-14 LAB — BASIC METABOLIC PANEL
Anion gap: 8 (ref 5–15)
BUN: 8 mg/dL (ref 6–20)
CHLORIDE: 102 mmol/L (ref 101–111)
CO2: 24 mmol/L (ref 22–32)
Calcium: 8.7 mg/dL — ABNORMAL LOW (ref 8.9–10.3)
Creatinine, Ser: 1.37 mg/dL — ABNORMAL HIGH (ref 0.61–1.24)
GFR calc Af Amer: 55 mL/min — ABNORMAL LOW (ref >60)
GFR calc non Af Amer: 48 mL/min — ABNORMAL LOW (ref >60)
GLUCOSE: 88 mg/dL (ref 65–99)
POTASSIUM: 5 mmol/L (ref 3.5–5.1)
Sodium: 134 mmol/L — ABNORMAL LOW (ref 135–145)

## 2018-01-14 LAB — CBC
HEMATOCRIT: 37.7 % — AB (ref 39.0–52.0)
HEMOGLOBIN: 11.9 g/dL — AB (ref 13.0–17.0)
MCH: 29 pg (ref 26.0–34.0)
MCHC: 31.6 g/dL (ref 30.0–36.0)
MCV: 91.7 fL (ref 78.0–100.0)
Platelets: 174 10*3/uL (ref 150–400)
RBC: 4.11 MIL/uL — ABNORMAL LOW (ref 4.22–5.81)
RDW: 17.5 % — AB (ref 11.5–15.5)
WBC: 8.4 10*3/uL (ref 4.0–10.5)

## 2018-01-14 MED ORDER — LEVOTHYROXINE SODIUM 50 MCG PO TABS
50.0000 ug | ORAL_TABLET | Freq: Every day | ORAL | Status: DC
  Administered 2018-01-15: 50 ug via ORAL
  Filled 2018-01-14: qty 1

## 2018-01-14 MED ORDER — METHYLPREDNISOLONE SODIUM SUCC 125 MG IJ SOLR
60.0000 mg | Freq: Once | INTRAMUSCULAR | Status: AC
  Administered 2018-01-14: 60 mg via INTRAVENOUS
  Filled 2018-01-14: qty 2

## 2018-01-14 MED ORDER — PREDNISONE 20 MG PO TABS
40.0000 mg | ORAL_TABLET | Freq: Every day | ORAL | Status: DC
  Administered 2018-01-15: 40 mg via ORAL
  Filled 2018-01-14: qty 2

## 2018-01-14 MED ORDER — SODIUM CHLORIDE 0.9 % IV SOLN: INTRAVENOUS | Status: DC

## 2018-01-14 MED ORDER — MORPHINE SULFATE (PF) 2 MG/ML IV SOLN
1.0000 mg | Freq: Three times a day (TID) | INTRAVENOUS | Status: DC
  Administered 2018-01-14 – 2018-01-15 (×3): 1 mg via INTRAVENOUS
  Filled 2018-01-14 (×3): qty 1

## 2018-01-14 NOTE — Progress Notes (Signed)
Discussed patient with Dr. Gala Romney, as he would be performing EGD tomorrow. Recommending hold on EGD/dilation and keep appt with [facility] upcoming 3/4. Appreciate speech input. Continue dysphagia 2 diet. Keep appt with [facility].

## 2018-01-14 NOTE — Evaluation (Signed)
Clinical/Bedside Swallow Evaluation Patient Details  Name: Justin Parsons MRN: [id number] Date of Birth: 08-08-1941  Today's Date: 01/14/2018 Time: SLP Start Time (ACUTE ONLY): 1400 SLP Stop Time (ACUTE ONLY): 1430 SLP Time Calculation (min) (ACUTE ONLY): 30 min  Past Medical History:  Past Medical History:  Diagnosis Date  . Adenomatous polyp of colon   . Alcoholism ([place])    [place]  . Arthritis   . Atrial fibrillation ([place])   . Blindness of right eye 1997  . CAD (coronary artery disease)    Multivessel disease at cardiac catheterization June 2018, DES to LAD June 2018 with significant residual disease including diagonal and obtuse marginal vessels as well as RCA  . Cirrhosis of liver (HCC)    Associated portal gastropathy  . CKD (chronic kidney disease) stage 3, GFR 30-59 ml/min (HCC)   . Essential hypertension   . Hiatal hernia   . Hypothyroidism   . 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 TIA (transient ischemic attack)    Past Surgical History:  Past Surgical History:  Procedure Laterality Date  . BIOPSY  12/23/2017   Procedure: BIOPSY;  Surgeon: Daneil Dolin, MD;  [facility];  Service: Endoscopy;;  esophagus  . CARDIAC CATHETERIZATION  2003  . COLONOSCOPY  08/2008   normal, repeat exam in 5-7 years  . COLONOSCOPY  2004   rectal adenomatous polyp  . COLONOSCOPY N/A 12/01/2014   LFY:BOFBPZ rectum/elongated colon  . CORONARY ATHERECTOMY N/A 05/03/2017   Procedure: Coronary Atherectomy;  Surgeon: Leonie Man, MD;  Location: [place] [facility] LAB;  Service: Cardiovascular;  Laterality: N/A;  . CORONARY STENT INTERVENTION N/A 05/03/2017   Procedure: Coronary Stent Intervention;  Surgeon: Leonie Man, MD;  Location: [place] CV LAB;  Service: Cardiovascular;  Laterality: N/A;  . ESOPHAGEAL DILATION N/A 02/07/2015   Procedure: ESOPHAGEAL DILATION;  Surgeon: Daneil Dolin, MD;  [facility];  Service: Endoscopy;  Laterality: N/A;  .  ESOPHAGEAL DILATION N/A 08/17/2017   Procedure: ESOPHAGEAL DILATION;  Surgeon: Danie Binder, MD;  [facility];  Service: Endoscopy;  Laterality: N/A;  . ESOPHAGEAL DILATION N/A 09/23/2017   Procedure: ESOPHAGEAL DILATION;  Surgeon: Danie Binder, MD;  [facility];  Service: Endoscopy;  Laterality: N/A;  . ESOPHAGEAL MANOMETRY N/A 01/10/2018   Procedure: ESOPHAGEAL MANOMETRY (EM);  Surgeon: Danie Binder, MD;  Location: [facility];  Service: Endoscopy;  Laterality: N/A;  . ESOPHAGOGASTRODUODENOSCOPY  02/11/2012   Dr. Gala Romney: portal gastropathy, gastric erosions, esophageal ulcerations likely pill-induced, surveillance in 2 years  . ESOPHAGOGASTRODUODENOSCOPY N/A 12/01/2014   Dr. Volney American esophageal stricture dilated with the scope passage, portal gastropathy, negative H.pylori on gastric biopsies, esophageal biopsies benign  . ESOPHAGOGASTRODUODENOSCOPY N/A 02/07/2015   Procedure: ESOPHAGOGASTRODUODENOSCOPY (EGD);  Surgeon: Daneil Dolin, MD;  [facility];  Service: Endoscopy;  Laterality: N/A;  1115  . ESOPHAGOGASTRODUODENOSCOPY N/A 08/17/2017   benign-appearing esophageal stenosis s/p dilation, mild gastritis, pylorus stenosis s/p dilation  . ESOPHAGOGASTRODUODENOSCOPY N/A 09/23/2017   moderate benign-appearing instrinsic stenosis s/p dilation, mild gastritis  . ESOPHAGOGASTRODUODENOSCOPY N/A 10/18/2017   Benign-appearing esophageal stricture s/p dilation, gastritis, benign-appearing intrinsice moderate pylorus stenosis s/p dilation  . ESOPHAGOGASTRODUODENOSCOPY (EGD) WITH PROPOFOL N/A 12/23/2017   Dr. Gala Romney: single short Grade 2 varix mid esophagus, just above GE junction was nearly circumferential denuding ulcerations/loss of normal mucosal appearance (somewhat punched out) mucosa. Query pill induced injury with path negative for malignancy. Portal hypertensive gastropathy, patent pylorus,  normal duodenal bulb and second portion of duodenum, no dilation  performed.   Marland Kitchen EYE SURGERY     RIGHT EYE REMOVED  . JOINT REPLACEMENT    . LEFT HEART CATH AND CORONARY ANGIOGRAPHY N/A 05/01/2017   Procedure: Left Heart Cath and Coronary Angiography;  Surgeon: Leonie Man, MD;  Location: [place] CV LAB;  Service: Cardiovascular;  Laterality: N/A;  . Venia Minks DILATION N/A 10/18/2017   Procedure: Venia Minks DILATION;  Surgeon: Danie Binder, MD;  [facility];  Service: Endoscopy;  Laterality: N/A;  . s/p eye implant  1997   artificial eye, right  . SAVORY DILATION N/A 10/18/2017   Procedure: SAVORY DILATION;  Surgeon: Danie Binder, MD;  [facility];  Service: Endoscopy;  Laterality: N/A;  . TOTAL HIP ARTHROPLASTY  2002  . TOTAL HIP ARTHROPLASTY  2006/2012   revision in 2012  . TOTAL KNEE ARTHROPLASTY  1999/2003   left/right   HPI:  77 year old man admitted from home on 2/17 with a one week history of intractable nausea and vomiting.  He has a history of Nash plus EtOH cirrhosis, esophageal stricture and pyloric stenosis.  He has waxed and waned throughout his hospital course.  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  Dr. Oneida Alar has arranged for patient to have manometry/impedance testing on 2/22, results are currently pending.  Has developed aspiration pneumonia. BSE ordered   Assessment / Plan / Recommendation Clinical Impression  Pt seen at bedside for a clinical swallow evaluation. Pt reports that his swallowing difficulties worsened following his "heart attack" in June. Oral motor evaluation is WNL, no gross asymmetry or weakness noted, CN appear intact. Pt with congested, wet cough prior to po trials and continued throughout po trials during assessment which makes it difficulty to discern if related to prandial aspiration. Discussed with Dr. Oneida Alar who stated an EGD with dilation is planned for tomorrow. Recommend SLP to follow up post EGD for further recommendations at that time. Continue diet as  ordered for now. SLP provided education regarding reflux precautions and aspiration precautions to Pt with an emphasis for HOB elevation during and after meals and taking smaller meals throughout the day.   SLP Visit Diagnosis: Dysphagia, unspecified (R13.10)    Aspiration Risk       Diet Recommendation Dysphagia 2 (Fine chop);Thin liquid   Liquid Administration via: Cup;Straw Medication Administration: Whole meds with liquid Supervision: Patient able to self feed;Intermittent supervision to cue for compensatory strategies Compensations: Slow rate;Small sips/bites Postural Changes: Seated upright at 90 degrees;Remain upright for at least 30 minutes after po intake    Other  Recommendations Recommended Consults: (Spoke with Dr. Oneida Alar and Pt may have EGD with dilation tomo) Oral Care Recommendations: Oral care BID;Staff/trained caregiver to provide oral care Other Recommendations: Clarify dietary restrictions   Follow up Recommendations (pending)      Frequency and Duration min 2x/week  1 week       Prognosis Prognosis for Safe Diet Advancement: Fair Barriers to Reach Goals: (esophageal impairment)      Swallow Study   General Date of Onset: 01/05/18 HPI: 77 year old man admitted from home on 2/17 with a one week history of intractable nausea and vomiting.  He has a history of Nash plus EtOH cirrhosis, esophageal stricture and pyloric stenosis.  He has waxed and waned throughout his hospital course.  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  Dr. Oneida Alar has arranged for  patient to have manometry/impedance testing on 2/22, results are currently pending.  Has developed aspiration pneumonia. BSE ordered Type of Study: Bedside Swallow Evaluation Previous Swallow Assessment: Pt has had EGD, BaSw, and manometry Diet Prior to this Study: Dysphagia 2 (chopped);Thin liquids Temperature Spikes Noted: No Respiratory Status: Nasal cannula History of Recent Intubation:  No Behavior/Cognition: Alert;Cooperative;Pleasant mood Oral Cavity Assessment: Within Functional Limits Oral Care Completed by SLP: No Oral Cavity - Dentition: Adequate natural dentition Vision: Functional for self-feeding Self-Feeding Abilities: Able to feed self Patient Positioning: Upright in bed Baseline Vocal Quality: Normal Volitional Cough: Congested;Strong Volitional Swallow: Able to elicit    Oral/Motor/Sensory Function Overall Oral Motor/Sensory Function: Within functional limits   Ice Chips Ice chips: Within functional limits Presentation: Spoon Other Comments: Pt with congested cough before and after po   Thin Liquid Thin Liquid: Within functional limits Presentation: Cup;Self Fed;Straw    Nectar Thick Nectar Thick Liquid: Not tested   Honey Thick Honey Thick Liquid: Not tested   Puree Puree: Within functional limits Presentation: Spoon   Solid   Thank you,  Genene Churn, CCC-SLP [phone]    Solid: (Pt declined)        PORTER,DABNEY 01/14/2018,4:25 PM

## 2018-01-14 NOTE — Progress Notes (Signed)
PROGRESS NOTE    Justin Parsons  JKD:[id number] DOB: Dec 10, 1940 DOA: 01/05/2018 PCP: Monico Blitz, MD     Brief Narrative:  77 year old man admitted from home on 2/17 with a one week history of intractable nausea and vomiting.  He has a history of Nash plus EtOH cirrhosis, esophageal stricture and pyloric stenosis.  He has waxed and waned throughout his hospital course.  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  Dr. Oneida Alar has arranged for patient to have manometry/impedance testing on 2/22, results are currently pending.  Has developed aspiration pneumonia.  On 2/26 noticed to have severe pain and inflammation of the left elbow and left wrist.  Assessment & Plan:   Active Problems:   Cirrhosis (HCC)   Thrombocytopenia ([place])   Hypothyroidism   Dysphagia, pharyngoesophageal phase   Esophageal stricture   Acute renal failure superimposed on stage 3 chronic kidney disease (HCC)   Intractable vomiting with nausea   Gastric outlet obstruction   Acute on chronic renal failure (HCC)   Dysphagia   Pressure injury of skin   Intractable nausea and vomiting/dysphagia -Has had esophageal dilatation x3 since September 2018, however dysphagia persists. -Barium pill esophagus  shows moderate narrowing within the distal esophagus.  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 -Manometry studies not consistent with achalasia and more so with mechanical stricture. -Patient has been scheduled to undergo repeat EGD on 2/27.  Aspiration pneumonia -Likely due to his dysphagia -Continue Augmentin. -Appreciate speech therapy recommendations.  Acute gout -Significant erythema and pain to the left  elbow and left wrist above the ulnar protuberance. -Will start quick steroid burst and taper.  We will give one-time dose of IV Solu-Medrol followed by oral prednisone tomorrow.  Acute on chronic kidney disease stage III -Creatinine on admission  was 2.8, baseline creatinine is around 1.8, creatinine is now better than baseline of around 1.37. -Lasix on hold.  Chronic atrial fibrillation -Rate controlled on Coreg. -Poor candidate for anticoagulation secondary to liver cirrhosis and portal hypertensive gastropathy.  Coronary artery disease -No chest pain.  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   DVT prophylaxis: SCDs Code Status: Full code Family Communication: Patient only Disposition Plan: Inpatient EGD planned for 2/27 Consultants:   GI  Procedures:   Manometry studies on 2/22.  Antimicrobials:  Anti-infectives (From admission, onward)   Start     Dose/Rate Route Frequency Ordered Stop   01/10/18 1800  amoxicillin-clavulanate (AUGMENTIN) 400-57 MG/5ML suspension 400 mg  Status:  Discontinued     400 mg Oral 2 times daily with meals 01/10/18 1627 01/10/18 1716   01/10/18 1800  amoxicillin-clavulanate (AUGMENTIN) 200-28.5 MG/5ML suspension 400 mg     400 mg of amoxicillin Oral 2 times daily with meals 01/10/18 1716         Subjective: Today complains of significant pain over his left wrist and left elbow, unable to move, and unable to tolerate even soft touch.  Objective: Vitals:   01/14/18 0602 01/14/18 0725 01/14/18 1300 01/14/18 1527  BP: 107/85  112/90   Pulse: 100  97   Resp:      Temp: 97.9 F (36.6 C)  98 F (36.7 C)   TempSrc: Oral     SpO2: 98% 92% 95% 97%  Weight: 110.1 kg (242 lb 11.6 oz)     Height:  Intake/Output Summary (Last 24 hours) at 01/14/2018 1700 Last data filed at 01/14/2018 1600 Gross per 24 hour  Intake 1988.33 ml  Output 1325 ml  Net 663.33 ml   Filed Weights   01/13/18 0647 01/14/18 0500 01/14/18 0602  Weight: 106.1 kg (233 lb 14.5 oz) 107.3 kg (236 lb 8.9 oz) 110.1 kg (242 lb 11.6 oz)    Examination:  General  exam: Alert, awake, oriented x 3 Respiratory system: Clear to auscultation. Respiratory effort normal. Cardiovascular system:RRR. No murmurs, rubs, gallops. Gastrointestinal system: Abdomen is nondistended, soft and nontender. No organomegaly or masses felt. Normal bowel sounds heard. Central nervous system: Alert and oriented. No focal neurological deficits. Extremities: No C/C/E, +pedal pulses Skin: Edema and erythema to the left elbow and left wrist specifically over the ulnar protuberance. Psychiatry: Judgement and insight appear normal. Mood & affect appropriate.           Data Reviewed: I have personally reviewed following labs and imaging studies  CBC: Recent Labs  Lab 01/09/18 0550 01/12/18 0621 01/14/18 0533  WBC 7.7 9.6 8.4  HGB 12.1* 11.6* 11.9*  HCT 38.5* 37.0* 37.7*  MCV 90.6 90.7 91.7  PLT 152 182 540   Basic Metabolic Panel: Recent Labs  Lab 01/08/18 0607 01/09/18 0550 01/12/18 0621 01/14/18 0533  NA 135 134* 137 134*  K 4.5 4.8 5.1 5.0  CL 103 103 107 102  CO2 23 23 24 24   GLUCOSE 91 80 79 88  BUN 19 14 10 8   CREATININE 1.55* 1.54* 1.34* 1.37*  CALCIUM 8.7* 8.6* 8.8* 8.7*  MG 1.9  --   --   --    GFR: Estimated Creatinine Clearance: 54.4 mL/min (A) (by C-G formula based on SCr of 1.37 mg/dL (H)). Liver Function Tests: No results for input(s): AST, ALT, ALKPHOS, BILITOT, PROT, ALBUMIN in the last 168 hours. No results for input(s): LIPASE, AMYLASE in the last 168 hours. No results for input(s): AMMONIA in the last 168 hours. Coagulation Profile: No results for input(s): INR, PROTIME in the last 168 hours. Cardiac Enzymes: No results for input(s): CKTOTAL, CKMB, CKMBINDEX, TROPONINI in the last 168 hours. BNP (last 3 results) No results for input(s): PROBNP in the last 8760 hours. HbA1C: No results for input(s): HGBA1C in the last 72 hours. CBG: No results for input(s): GLUCAP in the last 168 hours. Lipid Profile: No results for input(s):  CHOL, HDL, LDLCALC, TRIG, CHOLHDL, LDLDIRECT in the last 72 hours. Thyroid Function Tests: No results for input(s): TSH, T4TOTAL, FREET4, T3FREE, THYROIDAB in the last 72 hours. Anemia Panel: No results for input(s): VITAMINB12, FOLATE, FERRITIN, TIBC, IRON, RETICCTPCT in the last 72 hours. Urine analysis:    Component Value Date/Time   COLORURINE YELLOW 01/05/2018 [place] 01/05/2018 1433   LABSPEC 1.012 01/05/2018 1433   PHURINE 5.0 01/05/2018 1433   GLUCOSEU NEGATIVE 01/05/2018 1433   HGBUR NEGATIVE 01/05/2018 1433   BILIRUBINUR NEGATIVE 01/05/2018 1433   KETONESUR NEGATIVE 01/05/2018 1433   PROTEINUR 30 (A) 01/05/2018 1433   UROBILINOGEN 0.2 11/12/2013 2315   NITRITE NEGATIVE 01/05/2018 1433   LEUKOCYTESUR TRACE (A) 01/05/2018 1433   Sepsis Labs: @LABRCNTIP (procalcitonin:4,lacticidven:4)  )No results found for this or any previous visit (from the past 240 hour(s)).       Radiology Studies: No results found.      Scheduled Meds: . allopurinol  100 mg Oral Daily  . amoxicillin-clavulanate  400 mg of amoxicillin Oral BID WC  . aspirin  81 mg  Oral Daily  . atorvastatin  80 mg Oral q1800  . carvedilol  3.125 mg Oral BID WC  . cholecalciferol  400 Units Oral BID  . clopidogrel  75 mg Oral Daily  . feeding supplement  237 mL Oral QID  . folic acid  1 mg Oral Daily  . heparin  5,000 Units Subcutaneous Q8H  . ipratropium-albuterol  3 mL Nebulization Q6H  . [START ON 01/15/2018] levothyroxine  50 mcg Oral QAC breakfast  . methylPREDNISolone (SOLU-MEDROL) injection  60 mg Intravenous Once  .  morphine injection  1 mg Intravenous TID  . ondansetron (ZOFRAN) IV  4 mg Intravenous TID AC  . pantoprazole  40 mg Oral BID AC  . [START ON 01/15/2018] predniSONE  40 mg Oral Q breakfast   Continuous Infusions: . sodium chloride    . 0.9 % NaCl with KCl 20 mEq / L 50 mL/hr at 01/14/18 0612     LOS: 9 days    Time spent: 25 minutes. Greater than 50% of this  time was spent in direct contact with the patient coordinating care.     Lelon Frohlich, MD [facility] Pager [phone]  If 7PM-7AM, please contact night-coverage [email or web address] Password [email or web address] 01/14/2018, 5:00 PM

## 2018-01-14 NOTE — Progress Notes (Addendum)
    Subjective: Tolerating dysphagia 2 diet. No dysphagia. No N/V. Only complaint is of neck and left elbow pain. Has heat packs applied. Decreased appetite.   Objective: Vital signs in last 24 hours: Temp:  [97.5 F (36.4 C)-97.9 F (36.6 C)] 97.9 F (36.6 C) (02/26 0602) Pulse Rate:  [84-100] 100 (02/26 0602) Resp:  [20] 20 (02/25 1325) BP: (98-120)/(63-85) 107/85 (02/26 0602) SpO2:  [92 %-100 %] 92 % (02/26 0725) Weight:  [236 lb 8.9 oz (107.3 kg)-242 lb 11.6 oz (110.1 kg)] 242 lb 11.6 oz (110.1 kg) (02/26 0602) Last BM Date: 01/11/18 General:   Alert and oriented, pleasant Head:  Normocephalic and atraumatic. Abdomen:  Bowel sounds present, soft, non-tender, non-distended. No HSM or hernias noted. No rebound or guarding. No masses appreciated  Neurologic:  Alert and  oriented x4   Intake/Output from previous day: 02/25 0701 - 02/26 0700 In: 2205.8 [P.O.:840; I.V.:1365.8] Out: 1475 [Urine:1475] Intake/Output this shift: Total I/O In: 240 [P.O.:240] Out: 200 [Urine:200]  Lab Results: Recent Labs    01/12/18 0621 01/14/18 0533  WBC 9.6 8.4  HGB 11.6* 11.9*  HCT 37.0* 37.7*  PLT 182 174   BMET Recent Labs    01/12/18 0621 01/14/18 0533  NA 137 134*  K 5.1 5.0  CL 107 102  CO2 24 24  GLUCOSE 79 88  BUN 10 8  CREATININE 1.34* 1.37*  CALCIUM 8.8* 8.7*    Assessment:  77 year old male with history of NASH+/- ETOH cirrhosis, admitted with nausea, vomiting, dysphagia. He has had multiple dilations with improvement only short-lived historically. 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 He is tolerating dysphagia 2 diet although appetite is poor. EGD had been planned for surveillance in 3 months but may need to be considered earlier if unable to tolerate diet.   Neck pain: has had episodes of neck pain in the past (once while seen as an outpatient in our clinic) and  needs follow-up with PCP.   As of note, outpatient referral to [facility] already arranged and scheduled for 3/4 at 3:30 with Dr. Roney Mans. Discussed with Dr. Gala Romney who recommends keeping this appt.  Plan: Dysphagia 2 diet Sit upright while eating PPI BID Continue Reglan [facility] referral arranged for 3/4 at 3:30    Annitta Needs, PhD, ANP-BC [facility]    LOS: 9 days    01/14/2018, 11:20 AM

## 2018-01-15 ENCOUNTER — Encounter (HOSPITAL_COMMUNITY)
Admission: EM | Disposition: A | Discharge status: Home Health | Payer: Self-pay | Source: Home / Self Care | Attending: Internal Medicine

## 2018-01-15 DIAGNOSIS — M109 Gout, unspecified: Secondary | ICD-10-CM | POA: Diagnosis not present

## 2018-01-15 DIAGNOSIS — N183 Chronic kidney disease, stage 3 (moderate): Secondary | ICD-10-CM

## 2018-01-15 DIAGNOSIS — N170 Acute kidney failure with tubular necrosis: Secondary | ICD-10-CM

## 2018-01-15 SURGERY — EGD (ESOPHAGOGASTRODUODENOSCOPY)
Anesthesia: Moderate Sedation

## 2018-01-15 MED ORDER — AMOXICILLIN-POT CLAVULANATE 200-28.5 MG/5ML PO SUSR: 400.0000 mg | Freq: Two times a day (BID) | ORAL | 0 refills | Status: AC

## 2018-01-15 MED ORDER — PREDNISONE 20 MG PO TABS: 40.0000 mg | ORAL_TABLET | Freq: Every day | ORAL | 0 refills | Status: AC

## 2018-01-15 NOTE — Care Management (Signed)
Spoke with [facility] they can not provide transportation for patient. [place] [employer], research (life sciences) can. Added to AVS for patient to follow up.

## 2018-01-15 NOTE — Discharge Summary (Signed)
Physician Discharge Summary  Justin Parsons QHU:[id number] DOB: 08/26/1941 DOA: 01/05/2018  PCP: Monico Blitz, MD  Admit date: 01/05/2018 Discharge date: 01/15/2018  Admitted From: Home Disposition: Home  Recommendations for Outpatient Follow-up:  1. Follow up with PCP in 1 week.  Please check BMET 1 week. 2. Patient has appointment at [facility] on 3/5. 3. She will complete 7-day course of Augmentin on 3/1 and 5-day course of prednisone on 3/3.   Home Health: RN and PT Equipment/Devices: None  Discharge Condition: Fair CODE STATUS: Full code Diet recommendation: Dysphagia level 2     Discharge Diagnoses:  Principal Problem:   Gastric outlet obstruction Active Problems:   Dysphagia, pharyngoesophageal phase   Acute renal failure superimposed on stage 3 chronic kidney disease (HCC)     Acute gouty arthritis Aspiration pneumonia of bilateral lower lobe (R >L)   Intractable vomiting with nausea   Cirrhosis (HCC)   Thrombocytopenia (HCC)   Hypothyroidism   Esophageal stricture   Pressure injury of skin   Brief narrative/HPI Please refer to admission H&P for details, in brief, 77 year old male with history of alcoholic and Nash cirrhosis, diastolic CHF, anticoagulant, chronic kidney disease stage III, history of esophageal stricture and pyloric stenosis who presented with 1 week history of intractable nausea and vomiting progressed over the past few days.  Patient reported having dysphagia primarily to solid food.  He underwent EGD on 12/24/2017 which showed denuded distal esophageal mucosa with portal hypertensive gastropathy and grade 2 mild esophageal varix.  Pathology showed ulcerations without malignancy.  Patient was admitted for intractable nausea and vomiting.  Had barium pill esophagus which got lodged in the esophagogastric junction.  Patient underwent  manometry/impedance testing on 2/22 which was not consistent with achalasia and likely mechanical  stricture.  Hospital course prolonged due to development of aspiration pneumonia and acute gouty arthritis.  Hospital course Dysphagia with intractable nausea and vomiting. Has had 3 esophageal dilatation since September 2018 with persistent symptoms.  Barium pill esophagus showed moderate narrowing within the distal esophagus with barium tablets taking in that area along with esophageal dysmotility.  Manometric study not consistent with achalasia and likely mechanical stricture.  Patient has appointment with [facility] on 3/4 and since he is tolerating dysphagia level 2 diet GI recommended to hold off on EGD which was planned today and have him follow-up at [facility] early next week. Patient will be discharged on dysphagia level 2 diet along with PPI twice daily and Reglan.  Aspiration pneumonia (R >L) Due to dysphagia.  Treating with Augmentin for 7-day course (complete on 3/1) Appreciate speech therapy recommendations.  Acute gouty arthritis of left elbow and left wrist. Started on oral prednisone for 5-day course.  (Completes on 3/4).  Symptoms better today.  Acute on chronic kidney disease stage III Baseline creatinine around 1.8 (was 2.8 on admission).  Lasix was held.  Creatinine now improved to 1.37.  Resume Lasix upon discharge.  Chronic atrial fibrillation Rate controlled on Coreg.  Not a candidate for anticoagulations due to liver cirrhosis and portal hypertensive gastropathy.  Coronary artery disease / chronic diastolic CHF asymptomatic.  Continue aspirin, beta-blocker and statin.  Hypothyroidism Continue Synthroid.  Thrombocytopenia Mild secondary to cirrhosis with platelets in the range of 120-180.   Generalized weakness and deconditioning.  Will arrange home health PT and RN.  Social work also assisting with transport home and provided resource on transport to his outpatient GI appointment next week.   Procedure: Manometric study Barium  esophagus  Consults:  GI Family communication: None at bedside  Disposition: Home  Discharge Instructions   Allergies as of 01/15/2018   No Known Allergies     Medication List    STOP taking these medications   metoprolol tartrate 25 MG tablet Commonly known as:  LOPRESSOR     TAKE these medications   allopurinol 100 MG tablet Commonly known as:  ZYLOPRIM Take 1 tablet (100 mg total) by mouth daily. What changed:  how much to take   amoxicillin-clavulanate 200-28.5 MG/5ML suspension Commonly known as:  AUGMENTIN Take 10 mLs (400 mg total) by mouth 2 (two) times daily with a meal for 2 days.   aspirin 81 MG chewable tablet Chew 81 mg by mouth daily.   atorvastatin 80 MG tablet Commonly known as:  LIPITOR Take 80 mg by mouth daily at 6 PM.   baclofen 10 MG tablet Commonly known as:  LIORESAL Take 1 tablet by mouth 2 (two) times daily.   calcium carbonate 500 MG chewable tablet Commonly known as:  TUMS - dosed in mg elemental calcium Chew 1 tablet by mouth 2 (two) times daily.   carvedilol 3.125 MG tablet Commonly known as:  COREG Take 1 tablet by mouth 2 (two) times daily.   clopidogrel 75 MG tablet Commonly known as:  PLAVIX Take 1 tablet (75 mg total) by mouth daily.   feeding supplement Liqd Take 1 Container by mouth 4 (four) times daily. What changed:    when to take this  reasons to take this   folic acid 1 MG tablet Commonly known as:  FOLVITE Take 1 tablet (1 mg total) by mouth daily.   furosemide 40 MG tablet Commonly known as:  LASIX Take 40 mg by mouth daily.   levothyroxine 50 MCG tablet Commonly known as:  SYNTHROID, LEVOTHROID Take 50 mcg by mouth daily before breakfast.   metoCLOPramide 5 MG tablet Commonly known as:  REGLAN 1 po 30 minutes prior to meals bid What changed:    how much to take  how to take this  when to take this  additional instructions   NON FORMULARY Apply 1 application topically daily as needed (for pain). [place] Gel as  needed for arthritis pain   omeprazole 40 MG capsule Commonly known as:  PRILOSEC Take 1 capsule (40 mg total) by mouth 2 (two) times daily. What changed:  how much to take   polyethylene glycol packet Commonly known as:  MIRALAX / GLYCOLAX Take 17 g daily by mouth.   predniSONE 20 MG tablet Commonly known as:  DELTASONE Take 2 tablets (40 mg total) by mouth daily with breakfast for 4 days.   traMADol 50 MG tablet Commonly known as:  ULTRAM Take 1 tablet (50 mg total) by mouth every 6 (six) hours as needed.   vitamin C 500 MG tablet Commonly known as:  ASCORBIC ACID Take 500 mg by mouth daily.   Vitamin D2 400 units Tabs Take 1 tablet by mouth 2 (two) times daily.      Follow-up Information    [facility] Follow up.   Why:  [phone] -transportation services       Monico Blitz, MD Follow up in 1 week(s).   Specialty:  Internal Medicine Contact information: [place] [address] [phone]        [facility] Follow up on 01/20/2018.   Why:  HAS APPT         No Known Allergies  Procedures/Studies: Dg Chest 2 View  Result Date: 01/05/2018 CLINICAL DATA:  Vomiting and unable to keep down oral intake. EXAM: CHEST  2 VIEW COMPARISON:  09/20/2017 FINDINGS: Lungs are adequately inflated and otherwise clear. Cardiomediastinal silhouette and remainder of the exam is unchanged. IMPRESSION: No active cardiopulmonary disease. Electronically Signed   By: Marin Olp M.D.   On: 01/05/2018 10:23   Dg Abdomen 1 View  Result Date: 01/05/2018 CLINICAL DATA:  Vomiting and unable to keep down oral intake. EXAM: ABDOMEN - 1 VIEW COMPARISON:  10/01/2017 FINDINGS: Air is present throughout the colon. There are a few air-filled small bowel loops with 1 small bowel loop at the upper limits of normal in diameter measuring 3 cm over the left mid abdomen. No definite free peritoneal air. No mass or mass effect. Moderate degenerate change of the spine.  Bilateral hip arthroplasties unchanged. IMPRESSION: Nonspecific, nonobstructive bowel gas pattern with a single small bowel loop at the upper limits of normal in diameter over the right mid abdomen measuring 3 cm. Findings may be due to viral gastroenteritis or focal ileus. Electronically Signed   By: Marin Olp M.D.   On: 01/05/2018 10:27   Dg Esophagus  Result Date: 01/09/2018 CLINICAL DATA:  Dysphagia, vomiting EXAM: ESOPHOGRAM/BARIUM SWALLOW TECHNIQUE: Single contrast examination was performed using  thin barium. FLUOROSCOPY TIME:  Fluoroscopy Time:  2 min 6 sec Radiation Exposure Index (if provided by the fluoroscopic device): Number of Acquired Spot Images: 0 COMPARISON:  None. FINDINGS: Fluoroscopic evaluation of swallowing demonstrates disruption of primary esophageal waves. There is persistent narrowing within the distal esophagus. 13 mm barium tablet sticks in this area. No other areas of esophageal stricture or narrowing. No fold thickening. IMPRESSION: Moderate narrowing within the distal esophagus, smooth and long segment. This could be related to reflux stricture. The 13 mm barium tablet sticks in this area. Esophageal dysmotility. Electronically Signed   By: Rolm Baptise M.D.   On: 01/09/2018 10:27   Dg Chest Port 1 View  Result Date: 01/10/2018 CLINICAL DATA:  Productive cough as of today. Hx of a-fib, CAD, HTN. EXAM: PORTABLE CHEST - 1 VIEW COMPARISON:  01/05/2018 FINDINGS: Some increase in perihilar and infrahilar consolidation/atelectasis, right greater than left. Chronic blunting of the lateral costophrenic angles. Heart size upper limits normal. Aortic Atherosclerosis (ICD10-170.0) No pneumothorax. Visualized bones unremarkable. IMPRESSION: New perihilar and infrahilar airspace disease, right worse than left. Electronically Signed   By: Lucrezia Europe M.D.   On: 01/10/2018 19:44       Subjective: Reports some cough.  Pain over left elbow and wrist better this morning.  Discharge  Exam: Vitals:   01/15/18 0543 01/15/18 0733  BP: 115/85   Pulse: 89   Resp:    Temp: 98 F (36.7 C)   SpO2: 98% 98%   Vitals:   01/15/18 0107 01/15/18 0500 01/15/18 0543 01/15/18 0733  BP:   115/85   Pulse:   89   Resp:      Temp:   98 F (36.7 C)   TempSrc:   Oral   SpO2: 98%  98% 98%  Weight:  108.5 kg (239 lb 3.2 oz)    Height:        General: Elderly male not in distress HEENT: No pallor, moist mucosa, supple neck Chest: Clear to auscultation bilaterally CVS: Normal S1 and S2, no murmurs rubs or gallop GI: Soft, nondistended, nontender, bowel sounds present Musculoskeletal: Warm, no edema, minimal swelling and tenderness over the left elbow  and wrist    The results of significant diagnostics from this hospitalization (including imaging, microbiology, ancillary and laboratory) are listed below for reference.     Microbiology: No results found for this or any previous visit (from the past 240 hour(s)).   Labs: BNP (last 3 results) No results for input(s): BNP in the last 8760 hours. Basic Metabolic Panel: Recent Labs  Lab 01/09/18 0550 01/12/18 0621 01/14/18 0533  NA 134* 137 134*  K 4.8 5.1 5.0  CL 103 107 102  CO2 23 24 24   GLUCOSE 80 79 88  BUN 14 10 8   CREATININE 1.54* 1.34* 1.37*  CALCIUM 8.6* 8.8* 8.7*   Liver Function Tests: No results for input(s): AST, ALT, ALKPHOS, BILITOT, PROT, ALBUMIN in the last 168 hours. No results for input(s): LIPASE, AMYLASE in the last 168 hours. No results for input(s): AMMONIA in the last 168 hours. CBC: Recent Labs  Lab 01/09/18 0550 01/12/18 0621 01/14/18 0533  WBC 7.7 9.6 8.4  HGB 12.1* 11.6* 11.9*  HCT 38.5* 37.0* 37.7*  MCV 90.6 90.7 91.7  PLT 152 182 174   Cardiac Enzymes: No results for input(s): CKTOTAL, CKMB, CKMBINDEX, TROPONINI in the last 168 hours. BNP: Invalid input(s): POCBNP CBG: No results for input(s): GLUCAP in the last 168 hours. D-Dimer No results for input(s): DDIMER in the  last 72 hours. Hgb A1c No results for input(s): HGBA1C in the last 72 hours. Lipid Profile No results for input(s): CHOL, HDL, LDLCALC, TRIG, CHOLHDL, LDLDIRECT in the last 72 hours. Thyroid function studies No results for input(s): TSH, T4TOTAL, T3FREE, THYROIDAB in the last 72 hours.  Invalid input(s): FREET3 Anemia work up No results for input(s): VITAMINB12, FOLATE, FERRITIN, TIBC, IRON, RETICCTPCT in the last 72 hours. Urinalysis    Component Value Date/Time   COLORURINE YELLOW 01/05/2018 1433   APPEARANCEUR CLEAR 01/05/2018 1433   LABSPEC 1.012 01/05/2018 1433   PHURINE 5.0 01/05/2018 1433   GLUCOSEU NEGATIVE 01/05/2018 1433   HGBUR NEGATIVE 01/05/2018 1433   BILIRUBINUR NEGATIVE 01/05/2018 1433   KETONESUR NEGATIVE 01/05/2018 1433   PROTEINUR 30 (A) 01/05/2018 1433   UROBILINOGEN 0.2 11/12/2013 2315   NITRITE NEGATIVE 01/05/2018 1433   LEUKOCYTESUR TRACE (A) 01/05/2018 1433   Sepsis Labs Invalid input(s): PROCALCITONIN,  WBC,  LACTICIDVEN Microbiology No results found for this or any previous visit (from the past 240 hour(s)).   Time coordinating discharge: Over 30 minutes  SIGNED:   Louellen Molder, MD  [facility] 01/15/2018, 11:04 AM Pager   If 7PM-7AM, please contact night-coverage [email or web address] Password [email or web address]

## 2018-01-15 NOTE — Discharge Instructions (Signed)
Dysphagia Diet Level 2, Mechanically Altered The dysphagia level 2 diet includes foods that are blended, chopped, ground, or mashed so they are easier to chew and swallow. The foods are soft, moist, and can be chopped into -inch chunks (such as pancakes, pasta, and bananas). In order to be on this diet, you must be able to chew. This diet helps you transition between the pureed textures of the dysphagia level 1 diet to more solid textures. This diet is helpful for people with mild to moderate swallowing difficulties. It reduces the risk of food getting caught in the windpipe, trachea, or lungs. You may need help or supervision during meals while following this diet so that you eat safely. You will be on this diet until your health care provider advances the texture of your diet. What do I need to know about this diet? Foods  You may eat foods that are soft and moist. ? You may need to use a blender, whisk, or masher to soften some of your foods. ? You can moisten foods with gravies, sauces, vegetable or fruit juice, milk, half and half, or water when blending, mashing, or grinding your foods to the right consistency.  If you were on the dysphagia level 1 diet, you may still eat any of the foods included in that diet.  Avoid foods that are dry, hard, sticky, chewy, coarse, and crunchy. Also avoid large cuts of food.  Take small bites. Each bite should contain  inch or less of food. Liquids  Avoid liquids with seeds and chunks.  Thicken liquids, if instructed by your health care provider. Your health care provider will tell you the consistency to which you should thicken your liquids for safe swallowing. To thicken a liquid, use a commercial thickener or a thickening food (such as rice cereal or potato flakes). Ask your health care provider for specific recommendations on thickeners. See your dietitian or health care provider regularly for help with your dietary changes. What foods can I  eat? Grains Store-bought soft breads that do not have nuts or seeds. Pancakes, sweet rolls, [place] pastries, and [place] toast that have been moistened with syrup or sauce to form a slurry when blended. Well-cooked pasta, noodles, and bread dressing. Well-cooked noodles and pasta in sauce. Moist macaroni and cheese. Soft dumplings or spaetzle with gravy or butter. Cooked cereals (including oatmeal). Low-texture dry cereals, such as rice puff, corn, or wheat-flake cereals, with milk (if thin liquids are not allowed, make sure all of the milk is absorbed by the cereal before eating it). Vegetables Very soft, well-cooked vegetables in pieces less than  inch in size. Cooked potatoes that are moist, not crispy, and with sauce. Fruits Canned or cooked fruits that are soft or moist and do not have skin or seeds. Fresh, soft bananas. Fruit juices with a small amount of pulp (if thin liquids are allowed). Gelatin or plain gelatin with canned fruit, except pineapple. Meat and Other Protein Sources Tender, moist meats, poultry, or fish cooked with gravy or sauce and cubed to -inch bites or smaller. Ground meat. Moist meatball or meatloaf. Fish without bones. Moist casseroles without rice. Tuna, egg, or meat salad without chunks or hard-to-chew vegetables, such as celery and onions. Smooth quiche without large chunks. Scrambled, poached, or soft-cooked eggs with butter, margarine, sauce, or gravy. Tofu. Well-cooked, moistened and mashed beans, peas, baked beans, and other legumes. Casseroles without rice (such as tuna noodle casserole or soft moist meat lasagna). Dairy Cream cheese. Yogurt. [employer]  cheese. Ask your health care provider if milk is allowed. Sweets/Desserts Pudding. Custard. Soft fruit pies with crust on the bottom only. Crisps and cobblers without seeds or nuts and with soft crusts. Soft, moist cakes. Icing. Pre-gelled cookies. Soft, moist cookies dunked in milk, coffee, or another liquid. Jelly.  Soft, smooth chocolate bars that are easily chewed. Jams and preserves without seeds. Ask your health care provider whether you can have frozen desserts. Fats and Oils Butter. Margarine. Cream for cereal, depending on liquid consistency allowed. Gravy. Cream sauces. Mayonnaise. Salad dressings. Cream cheese. Cheese spreads, plain or with soft fruits or vegetables added. Sour cream. Sour cream dips with soft fruits or vegetables added. Whipped toppings. Other Sauces and salsas that have soft chunks that are about  inch or smaller. The items listed above may not be a complete list of recommended foods or beverages. Contact your dietitian for more options. What foods are not recommended? Grains All breads not listed in the recommended list. Breads that are hard or have nuts or seeds. Coarse cereals. Cereals that have nuts, seeds, dried fruits, or coconut. Rice. Corn. Vegetables Whole, raw, frozen, or dried vegetables. Tough, fibrous, chewy, or stringy cooked vegetables, such as celery, peas, broccoli, cabbage, Brussels sprouts, and asparagus. Potato skins. Potato and other vegetable chips. Fried or French-fried potatoes. Cooked corn and peas. Fruits Whole raw, frozen, or dried fruits, including coconut. Pineapple. Fruits with seeds. Meat and Other Protein Sources Dry, tough meats, such as bacon, sausage, and hot dogs. Cheese slices and cubes. Peanut butter. Hard boiled or fried eggs. Nuts. Seeds. Pizza. Sandwiches. Dry casseroles or casseroles with rice or large chunks. Dairy Yogurt with nuts, seeds, or large chunks. Sweets/Desserts Coarse, hard, chewy, or sticky desserts. Any dessert with nuts, seeds, coconut, pineapple, or dried fruit. Ask your health care provider whether you can have frozen desserts. Fats and Oils Avoid fats with chunky, large textures, such as those with nuts or fruits. Other Soups and casseroles with large chunks. The items listed above may not be a complete list of foods  and beverages to avoid. Contact your dietitian for more information. This information is not intended to replace advice given to you by your health care provider. Make sure you discuss any questions you have with your health care provider. Document Released: 11/05/2005 Document Revised: 04/12/2016 Document Reviewed: 10/19/2013 Elsevier Interactive Patient Education  [employer].

## 2018-01-15 NOTE — Progress Notes (Signed)
Subjective:  Advised patient of no EGD today and Dr. Roseanne Kaufman recommendations of next step to see Dr. Roney Mans at [facility] on 01/20/18. He is concerned he will not have transportation. He has someone who can go with him but no ride. Tolerated mash potatoes and liquids. Cannot eat meat, "no taste". No vomiting.   Objective: Vital signs in last 24 hours: Temp:  [97.6 F (36.4 C)-98 F (36.7 C)] 98 F (36.7 C) (02/27 0543) Pulse Rate:  [89-97] 89 (02/27 0543) BP: (112-115)/(75-90) 115/85 (02/27 0543) SpO2:  [95 %-98 %] 98 % (02/27 0733) Weight:  [239 lb 3.2 oz (108.5 kg)] 239 lb 3.2 oz (108.5 kg) (02/27 0500) Last BM Date: 01/14/18 General:   Alert,  Well-developed, well-nourished, pleasant and cooperative in NAD Head:  Normocephalic and atraumatic. Eyes:  Sclera clear, no icterus.  CAbdomen:  Soft, nontender and nondistended.Normal bowel sounds, without guarding, and without rebound.   Extremities:  Without clubbing, deformity or edema. Neurologic:  Alert and  oriented x4;  grossly normal neurologically. Skin:  Intact without significant lesions or rashes. Psych:  Alert and cooperative. Normal mood and affect.  Intake/Output from previous day: 02/26 0701 - 02/27 0700 In: 1450 [P.O.:900; I.V.:550] Out: 850 [Urine:850] Intake/Output this shift: No intake/output data recorded.  Lab Results: CBC Recent Labs    01/14/18 0533  WBC 8.4  HGB 11.9*  HCT 37.7*  MCV 91.7  PLT 174   BMET Recent Labs    01/14/18 0533  NA 134*  K 5.0  CL 102  CO2 24  GLUCOSE 88  BUN 8  CREATININE 1.37*  CALCIUM 8.7*   LFTs No results for input(s): BILITOT, BILIDIR, IBILI, ALKPHOS, AST, ALT, PROT, ALBUMIN in the last 72 hours. No results for input(s): LIPASE in the last 72 hours. PT/INR No results for input(s): LABPROT, INR in the last 72 hours.    Imaging Studies: Dg Chest 2 View  Result Date: 01/05/2018 CLINICAL DATA:  Vomiting and unable to keep down oral intake. EXAM: CHEST  2 VIEW  COMPARISON:  09/20/2017 FINDINGS: Lungs are adequately inflated and otherwise clear. Cardiomediastinal silhouette and remainder of the exam is unchanged. IMPRESSION: No active cardiopulmonary disease. Electronically Signed   By: Marin Olp M.D.   On: 01/05/2018 10:23   Dg Abdomen 1 View  Result Date: 01/05/2018 CLINICAL DATA:  Vomiting and unable to keep down oral intake. EXAM: ABDOMEN - 1 VIEW COMPARISON:  10/01/2017 FINDINGS: Air is present throughout the colon. There are a few air-filled small bowel loops with 1 small bowel loop at the upper limits of normal in diameter measuring 3 cm over the left mid abdomen. No definite free peritoneal air. No mass or mass effect. Moderate degenerate change of the spine. Bilateral hip arthroplasties unchanged. IMPRESSION: Nonspecific, nonobstructive bowel gas pattern with a single small bowel loop at the upper limits of normal in diameter over the right mid abdomen measuring 3 cm. Findings may be due to viral gastroenteritis or focal ileus. Electronically Signed   By: Marin Olp M.D.   On: 01/05/2018 10:27   Dg Esophagus  Result Date: 01/09/2018 CLINICAL DATA:  Dysphagia, vomiting EXAM: ESOPHOGRAM/BARIUM SWALLOW TECHNIQUE: Single contrast examination was performed using  thin barium. FLUOROSCOPY TIME:  Fluoroscopy Time:  2 min 6 sec Radiation Exposure Index (if provided by the fluoroscopic device): Number of Acquired Spot Images: 0 COMPARISON:  None. FINDINGS: Fluoroscopic evaluation of swallowing demonstrates disruption of primary esophageal waves. There is persistent narrowing within the distal esophagus. 13 mm  barium tablet sticks in this area. No other areas of esophageal stricture or narrowing. No fold thickening. IMPRESSION: Moderate narrowing within the distal esophagus, smooth and long segment. This could be related to reflux stricture. The 13 mm barium tablet sticks in this area. Esophageal dysmotility. Electronically Signed   By: Rolm Baptise M.D.   On:  01/09/2018 10:27   Dg Chest Port 1 View  Result Date: 01/10/2018 CLINICAL DATA:  Productive cough as of today. Hx of a-fib, CAD, HTN. EXAM: PORTABLE CHEST - 1 VIEW COMPARISON:  01/05/2018 FINDINGS: Some increase in perihilar and infrahilar consolidation/atelectasis, right greater than left. Chronic blunting of the lateral costophrenic angles. Heart size upper limits normal. Aortic Atherosclerosis (ICD10-170.0) No pneumothorax. Visualized bones unremarkable. IMPRESSION: New perihilar and infrahilar airspace disease, right worse than left. Electronically Signed   By: Lucrezia Europe M.D.   On: 01/10/2018 19:44  [2 weeks]   Assessment: 77 year old male with history of NASH+/- ETOH cirrhosis, admitted with nausea, vomiting, dysphagia. He has had multiple dilations with improvement only short-lived historically. 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 He is tolerating dysphagia 2 diet although appetite is poor. EGD had been planned for surveillance in 3 months due to unusual denuded/punch out area in the esophagus.    As of note, outpatient referral to [facility] already arranged and scheduled for 3/4 at 3:30 with Dr. Roney Mans. No EGD planned today as Dr. Gala Romney recommends next step in evaluation by Dr. Roney Mans for EG junction outflow obstruction/dysphagia with short lived improvement s/p dilation and no evidence of achalasia on manometry.   Dysphagia, less likely due to extrinsic compression but no recent ct chest.   Plan: 1. Continue dysphagia 2 diet.  2. PPI BID.  3. Continue Reglan. 4. Outpatient appt at [facility] as outlined. Will get case management to assistant with finding transportation options for him.   Laureen Ochs. Bernarda Caffey [facility] [phone] 2/27/201910:02 AM     LOS: 10 days

## 2018-01-15 NOTE — Progress Notes (Signed)
SLP Cancellation Note  Patient Details Name: Justin Parsons MRN: [id number] DOB: 11-May-1941   Cancelled treatment:       Reason Eval/Treat Not Completed: Other (comment); Acknowledge that EGD canceled for today and plans for follow up at [facility] next week. SLP will plan to sign off for now. If MD would like objective assessment of oropharyngeal swallow, can complete MBSS this admission (please order if you would like). Education regarding aspiration and reflux precautions completed yesterday with Pt and friend.   Thank you,  Genene Churn, [place]    [place] 01/15/2018, 11:11 AM

## 2018-01-15 NOTE — Progress Notes (Signed)
Pt's IV catheter removed and intact. Pt's IV site clean dry and intact. Discharge instructions including medications and follow up appointments were reviewed and discussed with patient and his friend. All questions were answered and no further questions at this time. Pt in stable condition and in no acute distress at time of discharge. Pt escorted by nurse tech.

## 2018-01-15 NOTE — Care Management Important Message (Signed)
Important Message  Patient Details  Name: ADIL TUGWELL MRN: [id number] Date of Birth: May 09, 1941   Medicare Important Message Given:  Yes    Deepti Gunawan, Chauncey [place], RN 01/15/2018, 11:16 AM

## 2018-01-15 NOTE — Care Management Note (Addendum)
Case Management Note  Patient Details  Name: DOMONIK LEVARIO MRN: [id number] Date of Birth: 08-28-1941    Expected Discharge Date:     01/15/2018             Expected Discharge Plan:  [place]  In-House Referral:  NA  Discharge planning Services  CM Consult, Other - See comment(transportation to appt.)  Post Acute Care Choice:  NA, Home Health Choice offered to:  Patient  DME Arranged:    DME Agency:     HH Arranged:   RN, PT HH Agency:  [place]  Status of Service:  Completed, signed off  If discussed at [place] of Stay Meetings, dates discussed:    Additional Comments: Patient discharging home today to f/u with [facility] on 01/20/2018 at 1530. He would like [facility] RN and PT. Has had [facility] in the past and would like again. [facility] [place] notified and will obtain orders from [place]. Patient arranging for someone to pick him up today. He has RW, and WC at home. Offered PT services here to asses if needs SNF, patient states he would not agree to SNF and would only agree to [facility].  He is worried about transportation to his upcoming appt.  We discuss [facility] and [facility] transportation. He is agreeable to using these services.  Added to AVS.  CM did call [facility] and left voicemail for return call to CM or patient.  Patient updated.   Shama Monfils, Chauncey [place], RN 01/15/2018, 11:05 AM

## 2018-01-17 DIAGNOSIS — M13832 Other specified arthritis, left wrist: Secondary | ICD-10-CM | POA: Diagnosis not present

## 2018-01-17 DIAGNOSIS — I482 Chronic atrial fibrillation: Secondary | ICD-10-CM | POA: Diagnosis not present

## 2018-01-17 DIAGNOSIS — I2510 Atherosclerotic heart disease of native coronary artery without angina pectoris: Secondary | ICD-10-CM | POA: Diagnosis not present

## 2018-01-17 DIAGNOSIS — Z8701 Personal history of pneumonia (recurrent): Secondary | ICD-10-CM | POA: Diagnosis not present

## 2018-01-17 DIAGNOSIS — R1310 Dysphagia, unspecified: Secondary | ICD-10-CM | POA: Diagnosis not present

## 2018-01-17 DIAGNOSIS — N183 Chronic kidney disease, stage 3 (moderate): Secondary | ICD-10-CM | POA: Diagnosis not present

## 2018-01-17 DIAGNOSIS — Z7982 Long term (current) use of aspirin: Secondary | ICD-10-CM | POA: Diagnosis not present

## 2018-01-17 DIAGNOSIS — M13822 Other specified arthritis, left elbow: Secondary | ICD-10-CM | POA: Diagnosis not present

## 2018-01-17 DIAGNOSIS — Z7902 Long term (current) use of antithrombotics/antiplatelets: Secondary | ICD-10-CM | POA: Diagnosis not present

## 2018-01-17 DIAGNOSIS — I5032 Chronic diastolic (congestive) heart failure: Secondary | ICD-10-CM | POA: Diagnosis not present

## 2018-01-17 DIAGNOSIS — R262 Difficulty in walking, not elsewhere classified: Secondary | ICD-10-CM | POA: Diagnosis not present

## 2018-01-20 DIAGNOSIS — R1310 Dysphagia, unspecified: Secondary | ICD-10-CM | POA: Diagnosis not present

## 2018-01-20 DIAGNOSIS — K219 Gastro-esophageal reflux disease without esophagitis: Secondary | ICD-10-CM | POA: Diagnosis not present

## 2018-01-21 ENCOUNTER — Ambulatory Visit: Payer: Medicare Other | Admitting: Internal Medicine

## 2018-01-21 NOTE — Telephone Encounter (Signed)
I spoke with Justin Parsons and she stated the patient was seen by Gi at [facility] on yesterday and ok to cancel appt here today with RMR that was made back in 11/2017.  I called the patient and made him aware that we cancelled his appt.  He went on to say that he is coughing up some phlegm, spoke with Justin Parsons and she said the patient needs to call his pcp for this issue.  Routing to [employer]

## 2018-01-21 NOTE — Telephone Encounter (Signed)
I made patient aware that he needs to call his pcp and he stated they will not call him in anything without seeing him and he doesn't have time to go over there today.  He said he is going back to the ER.  Routing to [place].

## 2018-01-21 NOTE — Telephone Encounter (Signed)
Noted  

## 2018-01-22 DIAGNOSIS — M13822 Other specified arthritis, left elbow: Secondary | ICD-10-CM | POA: Diagnosis not present

## 2018-01-22 DIAGNOSIS — I2510 Atherosclerotic heart disease of native coronary artery without angina pectoris: Secondary | ICD-10-CM | POA: Diagnosis not present

## 2018-01-22 DIAGNOSIS — I5032 Chronic diastolic (congestive) heart failure: Secondary | ICD-10-CM | POA: Diagnosis not present

## 2018-01-22 DIAGNOSIS — R262 Difficulty in walking, not elsewhere classified: Secondary | ICD-10-CM | POA: Diagnosis not present

## 2018-01-22 DIAGNOSIS — R1310 Dysphagia, unspecified: Secondary | ICD-10-CM | POA: Diagnosis not present

## 2018-01-22 DIAGNOSIS — M13832 Other specified arthritis, left wrist: Secondary | ICD-10-CM | POA: Diagnosis not present

## 2018-01-24 DIAGNOSIS — R262 Difficulty in walking, not elsewhere classified: Secondary | ICD-10-CM | POA: Diagnosis not present

## 2018-01-24 DIAGNOSIS — M13832 Other specified arthritis, left wrist: Secondary | ICD-10-CM | POA: Diagnosis not present

## 2018-01-24 DIAGNOSIS — R1310 Dysphagia, unspecified: Secondary | ICD-10-CM | POA: Diagnosis not present

## 2018-01-24 DIAGNOSIS — I2510 Atherosclerotic heart disease of native coronary artery without angina pectoris: Secondary | ICD-10-CM | POA: Diagnosis not present

## 2018-01-24 DIAGNOSIS — M13822 Other specified arthritis, left elbow: Secondary | ICD-10-CM | POA: Diagnosis not present

## 2018-01-24 DIAGNOSIS — I5032 Chronic diastolic (congestive) heart failure: Secondary | ICD-10-CM | POA: Diagnosis not present

## 2018-01-27 DIAGNOSIS — M13832 Other specified arthritis, left wrist: Secondary | ICD-10-CM | POA: Diagnosis not present

## 2018-01-27 DIAGNOSIS — M13822 Other specified arthritis, left elbow: Secondary | ICD-10-CM | POA: Diagnosis not present

## 2018-01-27 DIAGNOSIS — R262 Difficulty in walking, not elsewhere classified: Secondary | ICD-10-CM | POA: Diagnosis not present

## 2018-01-27 DIAGNOSIS — R1310 Dysphagia, unspecified: Secondary | ICD-10-CM | POA: Diagnosis not present

## 2018-01-27 DIAGNOSIS — I2510 Atherosclerotic heart disease of native coronary artery without angina pectoris: Secondary | ICD-10-CM | POA: Diagnosis not present

## 2018-01-27 DIAGNOSIS — I5032 Chronic diastolic (congestive) heart failure: Secondary | ICD-10-CM | POA: Diagnosis not present

## 2018-01-28 DIAGNOSIS — M13832 Other specified arthritis, left wrist: Secondary | ICD-10-CM | POA: Diagnosis not present

## 2018-01-28 DIAGNOSIS — I2510 Atherosclerotic heart disease of native coronary artery without angina pectoris: Secondary | ICD-10-CM | POA: Diagnosis not present

## 2018-01-28 DIAGNOSIS — M13822 Other specified arthritis, left elbow: Secondary | ICD-10-CM | POA: Diagnosis not present

## 2018-01-28 DIAGNOSIS — I5032 Chronic diastolic (congestive) heart failure: Secondary | ICD-10-CM | POA: Diagnosis not present

## 2018-01-28 DIAGNOSIS — R262 Difficulty in walking, not elsewhere classified: Secondary | ICD-10-CM | POA: Diagnosis not present

## 2018-01-28 DIAGNOSIS — R1310 Dysphagia, unspecified: Secondary | ICD-10-CM | POA: Diagnosis not present

## 2018-01-30 DIAGNOSIS — M13832 Other specified arthritis, left wrist: Secondary | ICD-10-CM | POA: Diagnosis not present

## 2018-01-30 DIAGNOSIS — I2510 Atherosclerotic heart disease of native coronary artery without angina pectoris: Secondary | ICD-10-CM | POA: Diagnosis not present

## 2018-01-30 DIAGNOSIS — I5032 Chronic diastolic (congestive) heart failure: Secondary | ICD-10-CM | POA: Diagnosis not present

## 2018-01-30 DIAGNOSIS — R1310 Dysphagia, unspecified: Secondary | ICD-10-CM | POA: Diagnosis not present

## 2018-01-30 DIAGNOSIS — R262 Difficulty in walking, not elsewhere classified: Secondary | ICD-10-CM | POA: Diagnosis not present

## 2018-01-30 DIAGNOSIS — M13822 Other specified arthritis, left elbow: Secondary | ICD-10-CM | POA: Diagnosis not present

## 2018-01-31 DIAGNOSIS — I5032 Chronic diastolic (congestive) heart failure: Secondary | ICD-10-CM | POA: Diagnosis not present

## 2018-01-31 DIAGNOSIS — R262 Difficulty in walking, not elsewhere classified: Secondary | ICD-10-CM | POA: Diagnosis not present

## 2018-01-31 DIAGNOSIS — I2510 Atherosclerotic heart disease of native coronary artery without angina pectoris: Secondary | ICD-10-CM | POA: Diagnosis not present

## 2018-01-31 DIAGNOSIS — M13832 Other specified arthritis, left wrist: Secondary | ICD-10-CM | POA: Diagnosis not present

## 2018-01-31 DIAGNOSIS — M13822 Other specified arthritis, left elbow: Secondary | ICD-10-CM | POA: Diagnosis not present

## 2018-01-31 DIAGNOSIS — R1310 Dysphagia, unspecified: Secondary | ICD-10-CM | POA: Diagnosis not present

## 2018-02-01 DIAGNOSIS — R262 Difficulty in walking, not elsewhere classified: Secondary | ICD-10-CM | POA: Diagnosis not present

## 2018-02-01 DIAGNOSIS — I5032 Chronic diastolic (congestive) heart failure: Secondary | ICD-10-CM | POA: Diagnosis not present

## 2018-02-01 DIAGNOSIS — M13822 Other specified arthritis, left elbow: Secondary | ICD-10-CM | POA: Diagnosis not present

## 2018-02-01 DIAGNOSIS — I2510 Atherosclerotic heart disease of native coronary artery without angina pectoris: Secondary | ICD-10-CM | POA: Diagnosis not present

## 2018-02-01 DIAGNOSIS — R1310 Dysphagia, unspecified: Secondary | ICD-10-CM | POA: Diagnosis not present

## 2018-02-01 DIAGNOSIS — M13832 Other specified arthritis, left wrist: Secondary | ICD-10-CM | POA: Diagnosis not present

## 2018-02-03 DIAGNOSIS — R262 Difficulty in walking, not elsewhere classified: Secondary | ICD-10-CM | POA: Diagnosis not present

## 2018-02-03 DIAGNOSIS — M13822 Other specified arthritis, left elbow: Secondary | ICD-10-CM | POA: Diagnosis not present

## 2018-02-03 DIAGNOSIS — R1310 Dysphagia, unspecified: Secondary | ICD-10-CM | POA: Diagnosis not present

## 2018-02-03 DIAGNOSIS — I2510 Atherosclerotic heart disease of native coronary artery without angina pectoris: Secondary | ICD-10-CM | POA: Diagnosis not present

## 2018-02-03 DIAGNOSIS — I5032 Chronic diastolic (congestive) heart failure: Secondary | ICD-10-CM | POA: Diagnosis not present

## 2018-02-03 DIAGNOSIS — M13832 Other specified arthritis, left wrist: Secondary | ICD-10-CM | POA: Diagnosis not present

## 2018-02-05 DIAGNOSIS — R1310 Dysphagia, unspecified: Secondary | ICD-10-CM | POA: Diagnosis not present

## 2018-02-05 DIAGNOSIS — I2510 Atherosclerotic heart disease of native coronary artery without angina pectoris: Secondary | ICD-10-CM | POA: Diagnosis not present

## 2018-02-05 DIAGNOSIS — M13822 Other specified arthritis, left elbow: Secondary | ICD-10-CM | POA: Diagnosis not present

## 2018-02-05 DIAGNOSIS — M13832 Other specified arthritis, left wrist: Secondary | ICD-10-CM | POA: Diagnosis not present

## 2018-02-05 DIAGNOSIS — R262 Difficulty in walking, not elsewhere classified: Secondary | ICD-10-CM | POA: Diagnosis not present

## 2018-02-05 DIAGNOSIS — I5032 Chronic diastolic (congestive) heart failure: Secondary | ICD-10-CM | POA: Diagnosis not present

## 2018-02-06 DIAGNOSIS — M13822 Other specified arthritis, left elbow: Secondary | ICD-10-CM | POA: Diagnosis not present

## 2018-02-06 DIAGNOSIS — R262 Difficulty in walking, not elsewhere classified: Secondary | ICD-10-CM | POA: Diagnosis not present

## 2018-02-06 DIAGNOSIS — I5032 Chronic diastolic (congestive) heart failure: Secondary | ICD-10-CM | POA: Diagnosis not present

## 2018-02-06 DIAGNOSIS — M13832 Other specified arthritis, left wrist: Secondary | ICD-10-CM | POA: Diagnosis not present

## 2018-02-06 DIAGNOSIS — R1310 Dysphagia, unspecified: Secondary | ICD-10-CM | POA: Diagnosis not present

## 2018-02-06 DIAGNOSIS — I2510 Atherosclerotic heart disease of native coronary artery without angina pectoris: Secondary | ICD-10-CM | POA: Diagnosis not present

## 2018-02-07 DIAGNOSIS — I4891 Unspecified atrial fibrillation: Secondary | ICD-10-CM | POA: Diagnosis not present

## 2018-02-07 DIAGNOSIS — R262 Difficulty in walking, not elsewhere classified: Secondary | ICD-10-CM | POA: Diagnosis not present

## 2018-02-07 DIAGNOSIS — M13822 Other specified arthritis, left elbow: Secondary | ICD-10-CM | POA: Diagnosis not present

## 2018-02-07 DIAGNOSIS — Z299 Encounter for prophylactic measures, unspecified: Secondary | ICD-10-CM | POA: Diagnosis not present

## 2018-02-07 DIAGNOSIS — I509 Heart failure, unspecified: Secondary | ICD-10-CM | POA: Diagnosis not present

## 2018-02-07 DIAGNOSIS — Z6834 Body mass index (BMI) 34.0-34.9, adult: Secondary | ICD-10-CM | POA: Diagnosis not present

## 2018-02-07 DIAGNOSIS — M1990 Unspecified osteoarthritis, unspecified site: Secondary | ICD-10-CM | POA: Diagnosis not present

## 2018-02-07 DIAGNOSIS — M2550 Pain in unspecified joint: Secondary | ICD-10-CM | POA: Diagnosis not present

## 2018-02-07 DIAGNOSIS — N184 Chronic kidney disease, stage 4 (severe): Secondary | ICD-10-CM | POA: Diagnosis not present

## 2018-02-07 DIAGNOSIS — R1310 Dysphagia, unspecified: Secondary | ICD-10-CM | POA: Diagnosis not present

## 2018-02-07 DIAGNOSIS — I5032 Chronic diastolic (congestive) heart failure: Secondary | ICD-10-CM | POA: Diagnosis not present

## 2018-02-07 DIAGNOSIS — M13832 Other specified arthritis, left wrist: Secondary | ICD-10-CM | POA: Diagnosis not present

## 2018-02-07 DIAGNOSIS — Z79899 Other long term (current) drug therapy: Secondary | ICD-10-CM | POA: Diagnosis not present

## 2018-02-07 DIAGNOSIS — R2681 Unsteadiness on feet: Secondary | ICD-10-CM | POA: Diagnosis not present

## 2018-02-07 DIAGNOSIS — M6281 Muscle weakness (generalized): Secondary | ICD-10-CM | POA: Diagnosis not present

## 2018-02-07 DIAGNOSIS — I2510 Atherosclerotic heart disease of native coronary artery without angina pectoris: Secondary | ICD-10-CM | POA: Diagnosis not present

## 2018-02-10 ENCOUNTER — Ambulatory Visit: Payer: Medicare Other | Admitting: Nurse Practitioner

## 2018-02-11 DIAGNOSIS — M13832 Other specified arthritis, left wrist: Secondary | ICD-10-CM | POA: Diagnosis not present

## 2018-02-11 DIAGNOSIS — I2510 Atherosclerotic heart disease of native coronary artery without angina pectoris: Secondary | ICD-10-CM | POA: Diagnosis not present

## 2018-02-11 DIAGNOSIS — R262 Difficulty in walking, not elsewhere classified: Secondary | ICD-10-CM | POA: Diagnosis not present

## 2018-02-11 DIAGNOSIS — I5032 Chronic diastolic (congestive) heart failure: Secondary | ICD-10-CM | POA: Diagnosis not present

## 2018-02-11 DIAGNOSIS — M13822 Other specified arthritis, left elbow: Secondary | ICD-10-CM | POA: Diagnosis not present

## 2018-02-11 DIAGNOSIS — R1310 Dysphagia, unspecified: Secondary | ICD-10-CM | POA: Diagnosis not present

## 2018-02-12 DIAGNOSIS — M13832 Other specified arthritis, left wrist: Secondary | ICD-10-CM | POA: Diagnosis not present

## 2018-02-12 DIAGNOSIS — I5032 Chronic diastolic (congestive) heart failure: Secondary | ICD-10-CM | POA: Diagnosis not present

## 2018-02-12 DIAGNOSIS — R262 Difficulty in walking, not elsewhere classified: Secondary | ICD-10-CM | POA: Diagnosis not present

## 2018-02-12 DIAGNOSIS — I2510 Atherosclerotic heart disease of native coronary artery without angina pectoris: Secondary | ICD-10-CM | POA: Diagnosis not present

## 2018-02-12 DIAGNOSIS — R1310 Dysphagia, unspecified: Secondary | ICD-10-CM | POA: Diagnosis not present

## 2018-02-12 DIAGNOSIS — M13822 Other specified arthritis, left elbow: Secondary | ICD-10-CM | POA: Diagnosis not present

## 2018-02-14 ENCOUNTER — Emergency Department (HOSPITAL_COMMUNITY): Payer: Medicare Other

## 2018-02-14 ENCOUNTER — Inpatient Hospital Stay (HOSPITAL_COMMUNITY)
Admission: EM | Admit: 2018-02-14 | Discharge: 2018-02-20 | DRG: 441 | Disposition: A | Discharge status: Skilled Nursing Facility | Payer: Medicare Other | Attending: Internal Medicine | Admitting: Internal Medicine

## 2018-02-14 ENCOUNTER — Encounter (HOSPITAL_COMMUNITY): Payer: Self-pay | Admitting: Cardiology

## 2018-02-14 ENCOUNTER — Other Ambulatory Visit: Payer: Self-pay

## 2018-02-14 DIAGNOSIS — N179 Acute kidney failure, unspecified: Secondary | ICD-10-CM | POA: Diagnosis not present

## 2018-02-14 DIAGNOSIS — N189 Chronic kidney disease, unspecified: Secondary | ICD-10-CM

## 2018-02-14 DIAGNOSIS — E785 Hyperlipidemia, unspecified: Secondary | ICD-10-CM | POA: Diagnosis not present

## 2018-02-14 DIAGNOSIS — Z79899 Other long term (current) drug therapy: Secondary | ICD-10-CM

## 2018-02-14 DIAGNOSIS — N182 Chronic kidney disease, stage 2 (mild): Secondary | ICD-10-CM

## 2018-02-14 DIAGNOSIS — K7290 Hepatic failure, unspecified without coma: Principal | ICD-10-CM | POA: Diagnosis present

## 2018-02-14 DIAGNOSIS — Z8673 Personal history of transient ischemic attack (TIA), and cerebral infarction without residual deficits: Secondary | ICD-10-CM

## 2018-02-14 DIAGNOSIS — I4891 Unspecified atrial fibrillation: Secondary | ICD-10-CM | POA: Diagnosis not present

## 2018-02-14 DIAGNOSIS — K311 Adult hypertrophic pyloric stenosis: Secondary | ICD-10-CM | POA: Diagnosis present

## 2018-02-14 DIAGNOSIS — Z79891 Long term (current) use of opiate analgesic: Secondary | ICD-10-CM

## 2018-02-14 DIAGNOSIS — R197 Diarrhea, unspecified: Secondary | ICD-10-CM

## 2018-02-14 DIAGNOSIS — K449 Diaphragmatic hernia without obstruction or gangrene: Secondary | ICD-10-CM | POA: Diagnosis present

## 2018-02-14 DIAGNOSIS — H54413A Blindness right eye category 3, normal vision left eye: Secondary | ICD-10-CM | POA: Diagnosis not present

## 2018-02-14 DIAGNOSIS — I1310 Hypertensive heart and chronic kidney disease without heart failure, with stage 1 through stage 4 chronic kidney disease, or unspecified chronic kidney disease: Secondary | ICD-10-CM | POA: Diagnosis present

## 2018-02-14 DIAGNOSIS — I2510 Atherosclerotic heart disease of native coronary artery without angina pectoris: Secondary | ICD-10-CM | POA: Diagnosis present

## 2018-02-14 DIAGNOSIS — F1020 Alcohol dependence, uncomplicated: Secondary | ICD-10-CM | POA: Diagnosis present

## 2018-02-14 DIAGNOSIS — M6281 Muscle weakness (generalized): Secondary | ICD-10-CM | POA: Diagnosis not present

## 2018-02-14 DIAGNOSIS — M109 Gout, unspecified: Secondary | ICD-10-CM | POA: Diagnosis present

## 2018-02-14 DIAGNOSIS — G9340 Encephalopathy, unspecified: Secondary | ICD-10-CM | POA: Diagnosis not present

## 2018-02-14 DIAGNOSIS — S3992XA Unspecified injury of lower back, initial encounter: Secondary | ICD-10-CM | POA: Diagnosis not present

## 2018-02-14 DIAGNOSIS — E869 Volume depletion, unspecified: Secondary | ICD-10-CM | POA: Diagnosis present

## 2018-02-14 DIAGNOSIS — R627 Adult failure to thrive: Secondary | ICD-10-CM | POA: Diagnosis present

## 2018-02-14 DIAGNOSIS — E039 Hypothyroidism, unspecified: Secondary | ICD-10-CM | POA: Diagnosis not present

## 2018-02-14 DIAGNOSIS — H5461 Unqualified visual loss, right eye, normal vision left eye: Secondary | ICD-10-CM | POA: Diagnosis present

## 2018-02-14 DIAGNOSIS — R5383 Other fatigue: Secondary | ICD-10-CM | POA: Diagnosis not present

## 2018-02-14 DIAGNOSIS — R112 Nausea with vomiting, unspecified: Secondary | ICD-10-CM | POA: Diagnosis not present

## 2018-02-14 DIAGNOSIS — R4182 Altered mental status, unspecified: Secondary | ICD-10-CM | POA: Diagnosis not present

## 2018-02-14 DIAGNOSIS — R2681 Unsteadiness on feet: Secondary | ICD-10-CM | POA: Diagnosis not present

## 2018-02-14 DIAGNOSIS — K110 Atrophy of salivary gland: Secondary | ICD-10-CM | POA: Diagnosis not present

## 2018-02-14 DIAGNOSIS — K222 Esophageal obstruction: Secondary | ICD-10-CM | POA: Diagnosis not present

## 2018-02-14 DIAGNOSIS — S3991XA Unspecified injury of abdomen, initial encounter: Secondary | ICD-10-CM | POA: Diagnosis not present

## 2018-02-14 DIAGNOSIS — Z66 Do not resuscitate: Secondary | ICD-10-CM | POA: Diagnosis present

## 2018-02-14 DIAGNOSIS — I482 Chronic atrial fibrillation, unspecified: Secondary | ICD-10-CM | POA: Diagnosis present

## 2018-02-14 DIAGNOSIS — Q400 Congenital hypertrophic pyloric stenosis: Secondary | ICD-10-CM | POA: Diagnosis not present

## 2018-02-14 DIAGNOSIS — D696 Thrombocytopenia, unspecified: Secondary | ICD-10-CM | POA: Diagnosis not present

## 2018-02-14 DIAGNOSIS — Z96643 Presence of artificial hip joint, bilateral: Secondary | ICD-10-CM | POA: Diagnosis present

## 2018-02-14 DIAGNOSIS — G9341 Metabolic encephalopathy: Secondary | ICD-10-CM | POA: Diagnosis not present

## 2018-02-14 DIAGNOSIS — I8500 Esophageal varices without bleeding: Secondary | ICD-10-CM | POA: Diagnosis present

## 2018-02-14 DIAGNOSIS — I9589 Other hypotension: Secondary | ICD-10-CM | POA: Diagnosis not present

## 2018-02-14 DIAGNOSIS — R1312 Dysphagia, oropharyngeal phase: Secondary | ICD-10-CM | POA: Diagnosis not present

## 2018-02-14 DIAGNOSIS — S79911A Unspecified injury of right hip, initial encounter: Secondary | ICD-10-CM | POA: Diagnosis not present

## 2018-02-14 DIAGNOSIS — S0990XA Unspecified injury of head, initial encounter: Secondary | ICD-10-CM | POA: Diagnosis not present

## 2018-02-14 DIAGNOSIS — E861 Hypovolemia: Secondary | ICD-10-CM

## 2018-02-14 DIAGNOSIS — I480 Paroxysmal atrial fibrillation: Secondary | ICD-10-CM | POA: Diagnosis not present

## 2018-02-14 DIAGNOSIS — I959 Hypotension, unspecified: Secondary | ICD-10-CM | POA: Diagnosis not present

## 2018-02-14 DIAGNOSIS — N184 Chronic kidney disease, stage 4 (severe): Secondary | ICD-10-CM

## 2018-02-14 DIAGNOSIS — K7030 Alcoholic cirrhosis of liver without ascites: Secondary | ICD-10-CM | POA: Diagnosis present

## 2018-02-14 DIAGNOSIS — K766 Portal hypertension: Secondary | ICD-10-CM | POA: Diagnosis present

## 2018-02-14 DIAGNOSIS — R262 Difficulty in walking, not elsewhere classified: Secondary | ICD-10-CM | POA: Diagnosis not present

## 2018-02-14 DIAGNOSIS — Z7982 Long term (current) use of aspirin: Secondary | ICD-10-CM

## 2018-02-14 DIAGNOSIS — Z955 Presence of coronary angioplasty implant and graft: Secondary | ICD-10-CM

## 2018-02-14 DIAGNOSIS — N183 Chronic kidney disease, stage 3 (moderate): Secondary | ICD-10-CM | POA: Diagnosis not present

## 2018-02-14 DIAGNOSIS — K219 Gastro-esophageal reflux disease without esophagitis: Secondary | ICD-10-CM | POA: Diagnosis present

## 2018-02-14 DIAGNOSIS — R0602 Shortness of breath: Secondary | ICD-10-CM | POA: Diagnosis not present

## 2018-02-14 DIAGNOSIS — D649 Anemia, unspecified: Secondary | ICD-10-CM | POA: Diagnosis not present

## 2018-02-14 DIAGNOSIS — R531 Weakness: Secondary | ICD-10-CM

## 2018-02-14 DIAGNOSIS — N171 Acute kidney failure with acute cortical necrosis: Secondary | ICD-10-CM

## 2018-02-14 DIAGNOSIS — K3189 Other diseases of stomach and duodenum: Secondary | ICD-10-CM | POA: Diagnosis present

## 2018-02-14 DIAGNOSIS — D6959 Other secondary thrombocytopenia: Secondary | ICD-10-CM | POA: Diagnosis present

## 2018-02-14 DIAGNOSIS — R630 Anorexia: Secondary | ICD-10-CM | POA: Diagnosis not present

## 2018-02-14 DIAGNOSIS — Z87891 Personal history of nicotine dependence: Secondary | ICD-10-CM

## 2018-02-14 DIAGNOSIS — E860 Dehydration: Secondary | ICD-10-CM | POA: Diagnosis not present

## 2018-02-14 DIAGNOSIS — Z7902 Long term (current) use of antithrombotics/antiplatelets: Secondary | ICD-10-CM

## 2018-02-14 DIAGNOSIS — K3184 Gastroparesis: Secondary | ICD-10-CM | POA: Diagnosis not present

## 2018-02-14 DIAGNOSIS — Z79890 Hormone replacement therapy (postmenopausal): Secondary | ICD-10-CM

## 2018-02-14 DIAGNOSIS — R404 Transient alteration of awareness: Secondary | ICD-10-CM | POA: Diagnosis not present

## 2018-02-14 LAB — PROCALCITONIN: Procalcitonin: 0.74 ng/mL

## 2018-02-14 LAB — URINALYSIS, ROUTINE W REFLEX MICROSCOPIC
Bilirubin Urine: NEGATIVE
GLUCOSE, UA: NEGATIVE mg/dL
Hgb urine dipstick: NEGATIVE
KETONES UR: NEGATIVE mg/dL
LEUKOCYTES UA: NEGATIVE
NITRITE: NEGATIVE
PROTEIN: NEGATIVE mg/dL
Specific Gravity, Urine: 1.004 — ABNORMAL LOW (ref 1.005–1.030)
pH: 5 (ref 5.0–8.0)

## 2018-02-14 LAB — CBC WITH DIFFERENTIAL/PLATELET
BASOS PCT: 0 %
Basophils Absolute: 0 10*3/uL (ref 0.0–0.1)
EOS ABS: 0.4 10*3/uL (ref 0.0–0.7)
Eosinophils Relative: 4 %
HCT: 33.6 % — ABNORMAL LOW (ref 39.0–52.0)
HEMOGLOBIN: 11.1 g/dL — AB (ref 13.0–17.0)
LYMPHS ABS: 1.8 10*3/uL (ref 0.7–4.0)
Lymphocytes Relative: 17 %
MCH: 30.2 pg (ref 26.0–34.0)
MCHC: 33 g/dL (ref 30.0–36.0)
MCV: 91.6 fL (ref 78.0–100.0)
Monocytes Absolute: 1.1 10*3/uL — ABNORMAL HIGH (ref 0.1–1.0)
Monocytes Relative: 10 %
NEUTROS PCT: 69 %
Neutro Abs: 7.4 10*3/uL (ref 1.7–7.7)
Platelets: 137 10*3/uL — ABNORMAL LOW (ref 150–400)
RBC: 3.67 MIL/uL — AB (ref 4.22–5.81)
RDW: 18.3 % — ABNORMAL HIGH (ref 11.5–15.5)
WBC: 10.7 10*3/uL — ABNORMAL HIGH (ref 4.0–10.5)

## 2018-02-14 LAB — COMPREHENSIVE METABOLIC PANEL
ALBUMIN: 2.5 g/dL — AB (ref 3.5–5.0)
ALT: 13 U/L — AB (ref 17–63)
AST: 19 U/L (ref 15–41)
Alkaline Phosphatase: 88 U/L (ref 38–126)
Anion gap: 16 — ABNORMAL HIGH (ref 5–15)
BUN: 62 mg/dL — AB (ref 6–20)
CHLORIDE: 90 mmol/L — AB (ref 101–111)
CO2: 26 mmol/L (ref 22–32)
CREATININE: 4.83 mg/dL — AB (ref 0.61–1.24)
Calcium: 8.1 mg/dL — ABNORMAL LOW (ref 8.9–10.3)
GFR calc Af Amer: 12 mL/min — ABNORMAL LOW (ref >60)
GFR, EST NON AFRICAN AMERICAN: 10 mL/min — AB (ref >60)
Glucose, Bld: 79 mg/dL (ref 65–99)
Potassium: 3.6 mmol/L (ref 3.5–5.1)
SODIUM: 132 mmol/L — AB (ref 135–145)
Total Bilirubin: 0.9 mg/dL (ref 0.3–1.2)
Total Protein: 5.4 g/dL — ABNORMAL LOW (ref 6.5–8.1)

## 2018-02-14 LAB — PROTIME-INR
INR: 1.09
PROTHROMBIN TIME: 14 s (ref 11.4–15.2)

## 2018-02-14 LAB — LACTIC ACID, PLASMA
Lactic Acid, Venous: 1.1 mmol/L (ref 0.5–1.9)
Lactic Acid, Venous: 0.9 mmol/L (ref 0.5–1.9)

## 2018-02-14 LAB — LIPASE, BLOOD: LIPASE: 22 U/L (ref 11–51)

## 2018-02-14 LAB — MRSA PCR SCREENING: MRSA by PCR: NEGATIVE

## 2018-02-14 LAB — TROPONIN I

## 2018-02-14 MED ORDER — METOCLOPRAMIDE HCL 10 MG PO TABS
5.0000 mg | ORAL_TABLET | Freq: Two times a day (BID) | ORAL | Status: DC
  Administered 2018-02-15 – 2018-02-20 (×7): 5 mg via ORAL
  Filled 2018-02-14 (×8): qty 1

## 2018-02-14 MED ORDER — BOOST / RESOURCE BREEZE PO LIQD CUSTOM
1.0000 | Freq: Four times a day (QID) | ORAL | Status: DC
  Administered 2018-02-17 – 2018-02-20 (×9): 1 via ORAL

## 2018-02-14 MED ORDER — CLOPIDOGREL BISULFATE 75 MG PO TABS
75.0000 mg | ORAL_TABLET | Freq: Every day | ORAL | Status: DC
Start: 2018-02-14 — End: 2018-02-20
  Administered 2018-02-14 – 2018-02-20 (×6): 75 mg via ORAL
  Filled 2018-02-14 (×6): qty 1

## 2018-02-14 MED ORDER — SODIUM CHLORIDE 0.9 % IV SOLN
INTRAVENOUS | Status: DC
  Administered 2018-02-14: 16:00:00 via INTRAVENOUS

## 2018-02-14 MED ORDER — ALLOPURINOL 100 MG PO TABS
100.0000 mg | ORAL_TABLET | Freq: Every day | ORAL | Status: DC
  Administered 2018-02-14 – 2018-02-20 (×6): 100 mg via ORAL
  Filled 2018-02-14 (×6): qty 1

## 2018-02-14 MED ORDER — BACLOFEN 10 MG PO TABS
10.0000 mg | ORAL_TABLET | Freq: Two times a day (BID) | ORAL | Status: DC
  Administered 2018-02-14 – 2018-02-15 (×3): 10 mg via ORAL
  Filled 2018-02-14 (×3): qty 1

## 2018-02-14 MED ORDER — ATORVASTATIN CALCIUM 40 MG PO TABS
80.0000 mg | ORAL_TABLET | Freq: Every day | ORAL | Status: DC
  Administered 2018-02-14 – 2018-02-19 (×5): 80 mg via ORAL
  Filled 2018-02-14 (×5): qty 2

## 2018-02-14 MED ORDER — FOLIC ACID 1 MG PO TABS
1.0000 mg | ORAL_TABLET | Freq: Every day | ORAL | Status: DC
  Administered 2018-02-14 – 2018-02-20 (×6): 1 mg via ORAL
  Filled 2018-02-14 (×6): qty 1

## 2018-02-14 MED ORDER — POTASSIUM CHLORIDE CRYS ER 20 MEQ PO TBCR
20.0000 meq | EXTENDED_RELEASE_TABLET | Freq: Once | ORAL | Status: AC
  Administered 2018-02-14: 20 meq via ORAL
  Filled 2018-02-14: qty 1

## 2018-02-14 MED ORDER — ONDANSETRON HCL 4 MG/2ML IJ SOLN: 4.0000 mg | Freq: Four times a day (QID) | INTRAMUSCULAR | Status: DC | PRN

## 2018-02-14 MED ORDER — ASPIRIN 81 MG PO CHEW
81.0000 mg | CHEWABLE_TABLET | Freq: Every day | ORAL | Status: DC
  Administered 2018-02-14 – 2018-02-20 (×6): 81 mg via ORAL
  Filled 2018-02-14 (×6): qty 1

## 2018-02-14 MED ORDER — ACETAMINOPHEN 325 MG PO TABS: 650.0000 mg | ORAL_TABLET | Freq: Four times a day (QID) | ORAL | Status: DC | PRN

## 2018-02-14 MED ORDER — PANTOPRAZOLE SODIUM 40 MG PO TBEC
40.0000 mg | DELAYED_RELEASE_TABLET | Freq: Every day | ORAL | Status: DC
  Administered 2018-02-14 – 2018-02-20 (×6): 40 mg via ORAL
  Filled 2018-02-14 (×6): qty 1

## 2018-02-14 MED ORDER — SODIUM CHLORIDE 0.9 % IV BOLUS
1000.0000 mL | Freq: Once | INTRAVENOUS | Status: AC
  Administered 2018-02-14: 1000 mL via INTRAVENOUS

## 2018-02-14 MED ORDER — CALCIUM CARBONATE ANTACID 500 MG PO CHEW
1.0000 | CHEWABLE_TABLET | Freq: Two times a day (BID) | ORAL | Status: DC
  Administered 2018-02-14 – 2018-02-20 (×9): 200 mg via ORAL
  Filled 2018-02-14 (×10): qty 1

## 2018-02-14 MED ORDER — LEVOTHYROXINE SODIUM 25 MCG PO TABS
50.0000 ug | ORAL_TABLET | Freq: Every day | ORAL | Status: DC
  Administered 2018-02-15 – 2018-02-20 (×4): 50 ug via ORAL
  Filled 2018-02-14 (×5): qty 2

## 2018-02-14 MED ORDER — HEPARIN SODIUM (PORCINE) 5000 UNIT/ML IJ SOLN
5000.0000 [IU] | Freq: Three times a day (TID) | INTRAMUSCULAR | Status: DC
  Administered 2018-02-14 – 2018-02-19 (×14): 5000 [IU] via SUBCUTANEOUS
  Filled 2018-02-14 (×14): qty 1

## 2018-02-14 MED ORDER — POTASSIUM CHLORIDE IN NACL 20-0.9 MEQ/L-% IV SOLN
INTRAVENOUS | Status: DC
  Administered 2018-02-14: 19:00:00 via INTRAVENOUS
  Administered 2018-02-15: 1 mL via INTRAVENOUS
  Administered 2018-02-15 – 2018-02-20 (×11): via INTRAVENOUS

## 2018-02-14 MED ORDER — TRAMADOL HCL 50 MG PO TABS
50.0000 mg | ORAL_TABLET | Freq: Four times a day (QID) | ORAL | Status: DC | PRN
  Administered 2018-02-17 – 2018-02-18 (×2): 50 mg via ORAL
  Filled 2018-02-14 (×2): qty 1

## 2018-02-14 MED ORDER — ONDANSETRON HCL 4 MG PO TABS: 4.0000 mg | ORAL_TABLET | Freq: Four times a day (QID) | ORAL | Status: DC | PRN

## 2018-02-14 MED ORDER — ACETAMINOPHEN 650 MG RE SUPP: 650.0000 mg | Freq: Four times a day (QID) | RECTAL | Status: DC | PRN

## 2018-02-14 NOTE — ED Provider Notes (Signed)
[facility] EMERGENCY DEPARTMENT Provider Note   CSN: [id number] Arrival date & time: 02/14/18  1058     History   Chief Complaint Chief Complaint  Patient presents with  . Weakness    HPI Justin Parsons is a 77 y.o. male.  HPI  Pt was seen at 1110. Per EMS and pt report, c/o gradual onset and worsening of persistent generalized weakness for the past 2 weeks. Pt states he "can't stand up" and has been "falling down" due to his weakness. Pt states he also has had multiple intermittent episodes of diarrhea for the past 2 to 3 days. Denies N/V, no black or blood in stools, no CP/SOB, no abd pain, no focal motor weakness, no tingling/numbness in extremities, no syncope/near syncope.    Past Medical History:  Diagnosis Date  . Adenomatous polyp of colon   . Alcoholism ([place])    [place]  . Arthritis   . Atrial fibrillation ([place])   . Blindness of right eye 1997  . CAD (coronary artery disease)    Multivessel disease at cardiac catheterization June 2018, DES to LAD June 2018 with significant residual disease including diagonal and obtuse marginal vessels as well as RCA  . Cirrhosis of liver (HCC)    Associated portal gastropathy  . CKD (chronic kidney disease) stage 3, GFR 30-59 ml/min (HCC)   . Essential hypertension   . Hiatal hernia   . Hypothyroidism   . 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 TIA (transient ischemic attack)     Patient Active Problem List   Diagnosis Date Noted  . Acute gouty arthritis 01/15/2018  . Pressure injury of skin 01/12/2018  . Gastritis due to nonsteroidal anti-inflammatory drug   . 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 Intractable vomiting 09/20/2017  . Atrial fibrillation with RVR ([place]) 09/20/2017  . Atrial fibrillation, chronic ([place]) 08/18/2017  . Gastric outlet obstruction   . AKI (acute kidney injury) ([place])   . Nausea with vomiting 08/15/2017  . Hypotension 08/15/2017  . Dehydration 08/15/2017  . Intractable vomiting with  nausea 08/15/2017  . Transaminasemia 08/15/2017  . AF (paroxysmal atrial fibrillation) ([place]) 08/15/2017  . 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 Abnormal liver enzymes   . Coronary artery disease involving native coronary artery of native heart with angina pectoris ([place])   . Tobacco abuse   . Acute renal failure superimposed on stage 3 chronic kidney disease ([place])   . Alcohol use disorder   . Non-ST elevation (NSTEMI) myocardial infarction ([place]) 04/29/2017  . Delirium tremens ([place])   . Shock circulatory ([place])   . Confusion   . Acute encephalopathy 04/27/2017  . Schatzki's ring   . Esophageal varices ([place])   . History of esophageal stricture 01/20/2015  . Esophageal stricture   . History of colonic polyps   . Dysphagia, pharyngoesophageal phase 11/04/2014  . Suicidal ideation 11/17/2013  . Acute renal failure ([place]) 11/17/2013  . Acute diastolic CHF (congestive heart failure) ([place]) 11/13/2013  . Acute respiratory failure ([place]) 11/13/2013  . Community acquired pneumonia 11/12/2013  . A-fib ([place]) 11/12/2013  . CKD (chronic kidney disease), stage III ([place]) 11/12/2013  . Hypothyroidism 11/12/2013  . Dyspnea 11/12/2013  . Thrombocytopenia ([place]) 06/05/2012  . ARF (acute renal failure) ([place]) 06/04/2012  . Obesity 06/04/2012  . Cirrhosis ([place]) 01/29/2012  . Adenomatous polyp 01/29/2012  . Renal insufficiency 05/01/2011  . Anemia 05/01/2011  . Rectal bleed 05/01/2011  . GERD (gastroesophageal reflux disease) 05/01/2011  Past Surgical History:  Procedure Laterality Date  . BIOPSY  12/23/2017   Procedure: BIOPSY;  Surgeon: Daneil Dolin, MD;  [facility];  Service: Endoscopy;;  esophagus  . CARDIAC CATHETERIZATION  2003  . COLONOSCOPY  08/2008   normal, repeat exam in 5-7 years  . COLONOSCOPY  2004   rectal adenomatous polyp  . COLONOSCOPY N/A 12/01/2014   IFO:YDXAJO rectum/elongated colon  . CORONARY ATHERECTOMY N/A 05/03/2017    Procedure: Coronary Atherectomy;  Surgeon: Leonie Man, MD;  Location: [place] CV LAB;  Service: Cardiovascular;  Laterality: N/A;  . CORONARY STENT INTERVENTION N/A 05/03/2017   Procedure: Coronary Stent Intervention;  Surgeon: Leonie Man, MD;  Location: [place] [facility] LAB;  Service: Cardiovascular;  Laterality: N/A;  . ESOPHAGEAL DILATION N/A 02/07/2015   Procedure: ESOPHAGEAL DILATION;  Surgeon: Daneil Dolin, MD;  [facility];  Service: Endoscopy;  Laterality: N/A;  . ESOPHAGEAL DILATION N/A 08/17/2017   Procedure: ESOPHAGEAL DILATION;  Surgeon: Danie Binder, MD;  [facility];  Service: Endoscopy;  Laterality: N/A;  . ESOPHAGEAL DILATION N/A 09/23/2017   Procedure: ESOPHAGEAL DILATION;  Surgeon: Danie Binder, MD;  [facility];  Service: Endoscopy;  Laterality: N/A;  . ESOPHAGEAL MANOMETRY N/A 01/10/2018   Procedure: ESOPHAGEAL MANOMETRY (EM);  Surgeon: Danie Binder, MD;  [facility]: [facility];  Service: Endoscopy;  Laterality: N/A;  . ESOPHAGOGASTRODUODENOSCOPY  02/11/2012   Dr. Gala Romney: portal gastropathy, gastric erosions, esophageal ulcerations likely pill-induced, surveillance in 2 years  . ESOPHAGOGASTRODUODENOSCOPY N/A 12/01/2014   Dr. Volney American esophageal stricture dilated with the scope passage, portal gastropathy, negative H.pylori on gastric biopsies, esophageal biopsies benign  . ESOPHAGOGASTRODUODENOSCOPY N/A 02/07/2015   Procedure: ESOPHAGOGASTRODUODENOSCOPY (EGD);  Surgeon: Daneil Dolin, MD;  [facility];  Service: Endoscopy;  Laterality: N/A;  1115  . ESOPHAGOGASTRODUODENOSCOPY N/A 08/17/2017   benign-appearing esophageal stenosis s/p dilation, mild gastritis, pylorus stenosis s/p dilation  . ESOPHAGOGASTRODUODENOSCOPY N/A 09/23/2017   moderate benign-appearing instrinsic stenosis s/p dilation, mild gastritis  . ESOPHAGOGASTRODUODENOSCOPY N/A 10/18/2017   Benign-appearing esophageal stricture s/p dilation,  gastritis, benign-appearing intrinsice moderate pylorus stenosis s/p dilation  . ESOPHAGOGASTRODUODENOSCOPY (EGD) WITH PROPOFOL N/A 12/23/2017   Dr. Gala Romney: single short Grade 2 varix mid esophagus, just above GE junction was nearly circumferential denuding ulcerations/loss of normal mucosal appearance (somewhat punched out) mucosa. Query pill induced injury with path negative for malignancy. Portal hypertensive gastropathy, patent pylorus, normal duodenal bulb and second portion of duodenum, no dilation performed.   Marland Kitchen EYE SURGERY     RIGHT EYE REMOVED  . JOINT REPLACEMENT    . LEFT HEART CATH AND CORONARY ANGIOGRAPHY N/A 05/01/2017   Procedure: Left Heart Cath and Coronary Angiography;  Surgeon: Leonie Man, MD;  Location: [place] [facility] LAB;  Service: Cardiovascular;  Laterality: N/A;  . Venia Minks DILATION N/A 10/18/2017   Procedure: Venia Minks DILATION;  Surgeon: Danie Binder, MD;  [facility];  Service: Endoscopy;  Laterality: N/A;  . s/p eye implant  1997   artificial eye, right  . SAVORY DILATION N/A 10/18/2017   Procedure: SAVORY DILATION;  Surgeon: Danie Binder, MD;  [facility];  Service: Endoscopy;  Laterality: N/A;  . TOTAL HIP ARTHROPLASTY  2002  . TOTAL HIP ARTHROPLASTY  2006/2012   revision in 2012  . TOTAL KNEE ARTHROPLASTY  1999/2003   left/right        Home Medications    Prior  to Admission medications   Medication Sig Start Date End Date Taking? Authorizing Provider  allopurinol (ZYLOPRIM) 100 MG tablet Take 1 tablet (100 mg total) by mouth daily. Patient taking differently: Take 300 mg by mouth daily.  08/19/17   Orson Eva, MD  aspirin 81 MG chewable tablet Chew 81 mg by mouth daily.    [provider]  atorvastatin (LIPITOR) 80 MG tablet Take 80 mg by mouth daily at 6 PM.  09/12/17   [provider]  baclofen (LIORESAL) 10 MG tablet Take 1 tablet by mouth 2 (two) times daily. 12/10/17   [provider]  calcium  carbonate (TUMS - DOSED IN MG ELEMENTAL CALCIUM) 500 MG chewable tablet Chew 1 tablet by mouth 2 (two) times daily.    [provider]  carvedilol (COREG) 3.125 MG tablet Take 1 tablet by mouth 2 (two) times daily. 12/20/17   [provider]  clopidogrel (PLAVIX) 75 MG tablet Take 1 tablet (75 mg total) by mouth daily. 05/07/17   Barton Dubois, MD  Ergocalciferol (VITAMIN D2) 400 units TABS Take 1 tablet by mouth 2 (two) times daily.    [provider]  feeding supplement (BOOST / RESOURCE BREEZE) LIQD Take 1 Container by mouth 4 (four) times daily. Patient taking differently: Take 1 Container by mouth daily as needed (does not drink all the time).  08/18/17   Orson Eva, MD  folic acid (FOLVITE) 1 MG tablet Take 1 tablet (1 mg total) by mouth daily. 05/07/17   Barton Dubois, MD  furosemide (LASIX) 40 MG tablet Take 40 mg by mouth daily. 11/14/17   [provider]  levothyroxine (SYNTHROID, LEVOTHROID) 50 MCG tablet Take 50 mcg by mouth daily before breakfast.  09/12/17   [provider]  metoCLOPramide (REGLAN) 5 MG tablet 1 po 30 minutes prior to meals bid Patient taking differently: Take 5 mg by mouth 2 (two) times daily before a meal. 1 po 30 minutes prior to meals bid 10/18/17   Fields, Marga Melnick, MD  NON FORMULARY Apply 1 application topically daily as needed (for pain). [place] Gel as needed for arthritis pain     [provider]  omeprazole (PRILOSEC) 40 MG capsule Take 1 capsule (40 mg total) by mouth 2 (two) times daily. Patient taking differently: Take 20 mg by mouth 2 (two) times daily.  03/12/16   Carlis Stable, NP  polyethylene glycol (MIRALAX / GLYCOLAX) packet Take 17 g daily by mouth. 09/25/17   Kathie Dike, MD  traMADol (ULTRAM) 50 MG tablet Take 1 tablet (50 mg total) by mouth every 6 (six) hours as needed. 08/18/17   Orson Eva, MD  vitamin C (ASCORBIC ACID) 500 MG tablet Take 500 mg by mouth daily.    [provider]     Family History Family History  Problem Relation Age of Onset  . Ovarian cancer Sister   . Colon cancer Neg Hx     Social History Social History   Tobacco Use  . Smoking status: Never Smoker  . Smokeless tobacco: Never Used  . Tobacco comment: used to chew tobacco, none in 15 years  Substance Use Topics  . Alcohol use: No    Alcohol/week: 0.0 oz    Frequency: Never    Comment: no alcohol at present-in nursing home; half a gallon moonshine per week  . Drug use: No     Allergies   Patient has no known allergies.   Review of Systems Review of Systems ROS:  Statement: All systems negative except as marked or noted in the HPI; Constitutional: Negative for fever and chills. ; ; Eyes: Negative for eye pain, redness and discharge. ; ; ENMT: Negative for ear pain, hoarseness, nasal congestion, sinus pressure and sore throat. ; ; Cardiovascular: Negative for chest pain, palpitations, diaphoresis, dyspnea and peripheral edema. ; ; Respiratory: Negative for cough, wheezing and stridor. ; ; Gastrointestinal: +diarrhea. Negative for nausea, vomiting, abdominal pain, blood in stool, hematemesis, jaundice and rectal bleeding. . ; ; Genitourinary: Negative for dysuria, flank pain and hematuria. ; ; Musculoskeletal: Negative for back pain and neck pain. Negative for swelling and trauma.; ; Skin: Negative for pruritus, rash, abrasions, blisters, bruising and skin lesion.; ; Neuro: +generalized weakness. Negative for headache, lightheadedness and neck stiffness. Negative for altered level of consciousness, altered mental status, extremity weakness, paresthesias, involuntary movement, seizure and syncope.       Physical Exam Updated Vital Signs BP (!) 78/67   Pulse (!) 102   Temp 97.9 F (36.6 C) (Oral)   Ht 5' 8"  (1.727 m)   Wt 108.4 kg (239 lb)   SpO2 92%   BMI 36.34 kg/m     Patient Vitals for the past 24 hrs:  BP Temp Temp src Pulse Resp SpO2 Height Weight  02/14/18 1423 (!) 95/52  - - 88 16 94 % - -  02/14/18 1400 99/64 - - - 10 - - -  02/14/18 1345 (!) 84/59 - - 88 12 94 % - -  02/14/18 1315 (!) 86/65 - - - 17 - - -  02/14/18 1300 - - - 91 13 94 % - -  02/14/18 1245 92/66 - - 97 13 98 % - -  02/14/18 1221 - - - 94 11 93 % - -  02/14/18 1217 99/69 - - - - - - -  02/14/18 1130 (!) 79/55 - - - 15 - - -  02/14/18 1115 (!) 90/55 - - - 10 - - -  02/14/18 1109 - 97.9 F (36.6 C) Oral - - - - -  02/14/18 1104 - - - - - - 5' 8"  (1.727 m) 108.4 kg (239 lb)  02/14/18 1101 - - - (!) 102 - 92 % - -  02/14/18 1100 (!) 78/67 - - - - - - -     Physical Exam 1115: Physical examination:  Nursing notes reviewed; Vital signs and O2 SAT reviewed;  Constitutional: Well developed, Well nourished, In no acute distress; Head:  Normocephalic, atraumatic; Eyes: EOMI, PERRL, No scleral icterus; ENMT: Mouth and pharynx normal, Mucous membranes dry; Neck: Supple, Full range of motion, No lymphadenopathy; Cardiovascular: Irregular rate and rhythm, No gallop; Respiratory: Breath sounds clear & equal bilaterally, No wheezes.  Speaking full sentences with ease, Normal respiratory effort/excursion; Chest: Nontender, Movement normal; Abdomen: Soft, Nontender, Nondistended, Normal bowel sounds. +several fading ecchymosis to lower pannus.; Genitourinary: No CVA tenderness; Spine:  No midline CS, TS, LS tenderness.;; Extremities: Peripheral pulses normal, Pelvis stable. NT right hip/knee/ankle/foot. No deformity. +tr pedal edema bilat. No calf asymmetry.; Neuro: AA&Ox3, Major CN grossly intact. No facial droop. Speech clear. Grips equal. Strength 4/5 LLE, 3/5 RLE..; Skin: Color normal, Warm, Dry.   ED Treatments / Results  Labs (all labs ordered are listed, but only abnormal results are displayed)   EKG EKG Interpretation  Date/Time:  Friday February 14 2018 12:24:52 EDT Ventricular Rate:  97 PR Interval:    QRS Duration: 128 QT Interval:  368 QTC Calculation: 451 R  Axis:   -41 Text  Interpretation:  Atrial fibrillation Paired ventricular premature complexes Left bundle branch block When compared with ECG of 01/05/2018 No significant change was found Confirmed by Francine Graven [phone]) on 02/14/2018 12:42:15 PM   Radiology   Procedures Procedures (including critical care time)  Medications Ordered in ED Medications  sodium chloride 0.9 % bolus 1,000 mL (1,000 mLs Intravenous New Bag/Given 02/14/18 1120)  0.9 %  sodium chloride infusion (has no administration in time range)     Initial Impression / Assessment and Plan / ED Course  I have reviewed the triage vital signs and the nursing notes.  Pertinent labs & imaging results that were available during my care of the patient were reviewed by me and considered in my medical decision making (see chart for details).  MDM Reviewed: previous chart, nursing note and vitals Reviewed previous: labs and ECG Interpretation: labs, ECG, CT scan and x-ray Total time providing critical care: 30-74 minutes. This excludes time spent performing separately reportable procedures and services. Consults: admitting MD   CRITICAL CARE Performed by: Alfonzo Feller Total critical care time: 35 minutes Critical care time was exclusive of separately billable procedures and treating other patients. Critical care was necessary to treat or prevent imminent or life-threatening deterioration. Critical care was time spent personally by me on the following activities: development of treatment plan with patient and/or surrogate as well as nursing, discussions with consultants, evaluation of patient's response to treatment, examination of patient, obtaining history from patient or surrogate, ordering and performing treatments and interventions, ordering and review of laboratory studies, ordering and review of radiographic studies, pulse oximetry and re-evaluation of patient's condition.   Results for orders placed or performed during the  hospital encounter of 02/14/18  Urinalysis, Routine w reflex microscopic  Result Value Ref Range   Color, Urine STRAW (A) YELLOW   APPearance CLEAR CLEAR   Specific Gravity, Urine 1.004 (L) 1.005 - 1.030   pH 5.0 5.0 - 8.0   Glucose, UA NEGATIVE NEGATIVE mg/dL   Hgb urine dipstick NEGATIVE NEGATIVE   Bilirubin Urine NEGATIVE NEGATIVE   Ketones, ur NEGATIVE NEGATIVE mg/dL   Protein, ur NEGATIVE NEGATIVE mg/dL   Nitrite NEGATIVE NEGATIVE   Leukocytes, UA NEGATIVE NEGATIVE  Comprehensive metabolic panel  Result Value Ref Range   Sodium 132 (L) 135 - 145 mmol/L   Potassium 3.6 3.5 - 5.1 mmol/L   Chloride 90 (L) 101 - 111 mmol/L   CO2 26 22 - 32 mmol/L   Glucose, Bld 79 65 - 99 mg/dL   BUN 62 (H) 6 - 20 mg/dL   Creatinine, Ser 4.83 (H) 0.61 - 1.24 mg/dL   Calcium 8.1 (L) 8.9 - 10.3 mg/dL   Total Protein 5.4 (L) 6.5 - 8.1 g/dL   Albumin 2.5 (L) 3.5 - 5.0 g/dL   AST 19 15 - 41 U/L   ALT 13 (L) 17 - 63 U/L   Alkaline Phosphatase 88 38 - 126 U/L   Total Bilirubin 0.9 0.3 - 1.2 mg/dL   GFR calc non Af Amer 10 (L) >60 mL/min   GFR calc Af Amer 12 (L) >60 mL/min   Anion gap 16 (H) 5 - 15  Troponin I  Result Value Ref Range   Troponin I <0.03 <0.03 ng/mL  Lactic acid, plasma  Result Value Ref Range   Lactic Acid, Venous 1.1 0.5 - 1.9 mmol/L  Lactic acid, plasma  Result Value Ref Range   Lactic Acid, Venous 0.9 0.5 -  1.9 mmol/L  CBC with Differential  Result Value Ref Range   WBC 10.7 (H) 4.0 - 10.5 K/uL   RBC 3.67 (L) 4.22 - 5.81 MIL/uL   Hemoglobin 11.1 (L) 13.0 - 17.0 g/dL   HCT 33.6 (L) 39.0 - 52.0 %   MCV 91.6 78.0 - 100.0 fL   MCH 30.2 26.0 - 34.0 pg   MCHC 33.0 30.0 - 36.0 g/dL   RDW 18.3 (H) 11.5 - 15.5 %   Platelets 137 (L) 150 - 400 K/uL   Neutrophils Relative % 69 %   Neutro Abs 7.4 1.7 - 7.7 K/uL   Lymphocytes Relative 17 %   Lymphs Abs 1.8 0.7 - 4.0 K/uL   Monocytes Relative 10 %   Monocytes Absolute 1.1 (H) 0.1 - 1.0 K/uL   Eosinophils Relative 4 %    Eosinophils Absolute 0.4 0.0 - 0.7 K/uL   Basophils Relative 0 %   Basophils Absolute 0.0 0.0 - 0.1 K/uL  Lipase, blood  Result Value Ref Range   Lipase 22 11 - 51 U/L  Protime-INR  Result Value Ref Range   Prothrombin Time 14.0 11.4 - 15.2 seconds   INR 1.09    Dg Lumbar Spine Complete Result Date: 02/14/2018 CLINICAL DATA:  Increasing immobility over the last 2 weeks. Several falls. EXAM: LUMBAR SPINE - COMPLETE 4+ VIEW COMPARISON:  Abdominal radiographs 01/05/2018. Abdominopelvic CT 09/20/2017. FINDINGS: Five lumbar type vertebral bodies. The alignment is stable and near anatomic. There is multilevel spondylosis with disc space narrowing most advanced at L1-2. There are paraspinal osteophytes and mild facet hypertrophy. No evidence of acute fracture or pars defect. Bilateral hip arthroplasties and splenic artery atherosclerosis noted. IMPRESSION: No acute findings. Multilevel spondylosis, similar to previous studies. Electronically Signed   By: Richardean Sale M.D.   On: 02/14/2018 12:37   Ct Head Wo Contrast Result Date: 02/14/2018 CLINICAL DATA:  Head trauma EXAM: CT HEAD WITHOUT CONTRAST TECHNIQUE: Contiguous axial images were obtained from the base of the skull through the vertex without intravenous contrast. COMPARISON:  08/16/2017 FINDINGS: Brain: Diffuse cerebral atrophy. Mild chronic small vessel disease. No acute intracranial abnormality. Specifically, no hemorrhage, hydrocephalus, mass lesion, acute infarction, or significant intracranial injury. Vascular: No hyperdense vessel or unexpected calcification. Skull: No acute calvarial abnormality. Sinuses/Orbits: Visualized paranasal sinuses and mastoids clear. Orbital soft tissues unremarkable. Other: Locules of gas noted within the soft tissues in the left temporal region which appear to correspond to vessels, likely related to IV access. IMPRESSION: No acute intracranial abnormality. Atrophy, chronic microvascular disease. Electronically  Signed   By: Rolm Baptise M.D.   On: 02/14/2018 11:43   Dg Abd Acute W/chest Result Date: 02/14/2018 CLINICAL DATA:  Diarrhea with weakness and increasing immobility over the last 2 weeks. Several falls. EXAM: DG ABDOMEN ACUTE W/ 1V CHEST COMPARISON:  Radiographs 01/10/2018 and 01/05/2018. FINDINGS: Lordotic positioning on the chest radiograph. The heart appears mildly enlarged, but stable. There is atelectasis at both lung bases, but no confluent airspace opacity, pleural effusion or pneumothorax. Abdominal radiographs are somewhat limited by body habitus. There is no evidence of bowel distention, wall thickening or free air. There are no suspicious abdominal calcifications. Degenerative changes are present throughout the lumbar spine. Patient is status post bilateral total hip arthroplasty. IMPRESSION: No apparent acute findings in the chest, abdomen or pelvis. Cardiomegaly with mild bibasilar atelectasis. Electronically Signed   By: Richardean Sale M.D.   On: 02/14/2018 12:33   Dg Hip Unilat With Pelvis 2-3 Views  Right Result Date: 02/14/2018 CLINICAL DATA:  Increasing immobility over the last 2 weeks. Several falls. EXAM: DG HIP (WITH OR WITHOUT PELVIS) 2-3V RIGHT COMPARISON:  Abdominal radiographs 01/05/2018. Pelvic CT 09/20/2017. FINDINGS: Status post bilateral total hip arthroplasty. There is no evidence of acute fracture or dislocation. The left femoral stem is incompletely visualized. No evidence of hardware loosening. Degenerative changes are present in the lower lumbar spine. There are mild sacroiliac degenerative changes. IMPRESSION: No acute osseous findings status post bilateral total hip arthroplasty. The left femoral stem is incompletely visualized. Electronically Signed   By: Richardean Sale M.D.   On: 02/14/2018 12:35   Results for ISAAC, DUBIE (MRN [id number]) as of 02/14/2018 14:52  Ref. Range 01/09/2018 05:50 01/12/2018 06:21 01/14/2018 05:33 02/14/2018 11:22  BUN Latest Ref Range:  6 - 20 mg/dL 14 10 8  62 (H)  Creatinine Latest Ref Range: 0.61 - 1.24 mg/dL 1.54 (H) 1.34 (H) 1.37 (H) 4.83 (H)     1430:  No diarrhea while in the ED. New ARF on labs today. IVF bolus x2 given with slow improvement in BP. Pt appears clinically dehydrated. Abd remains benign, resps without distress. T/C returned from [facility] Dr. Carles Collet, case discussed, including:  HPI, pertinent PM/SHx, VS/PE, dx testing, ED course and treatment:  Agreeable to admit.     Final Clinical Impressions(s) / ED Diagnoses   Final diagnoses:  None    ED Discharge Orders    None       Francine Graven, DO 02/16/18 1449

## 2018-02-14 NOTE — Progress Notes (Signed)
Patient had 27 beat run of Vtach. He was asymptomatic. Dr. Carles Collet notified.

## 2018-02-14 NOTE — H&P (Signed)
History and Physical  Justin Parsons NKN:[id number] DOB: 1941-10-06 DOA: 02/14/2018   PCP: Monico Blitz, MD   Patient coming from: Home  Chief Complaint: Diarrhea, generalized weakness  HPI:  Justin Parsons is a 77 y.o. male with medical history of NASH +/- Etoh cirrhosis,esophageal stricture, pyloric stenosis, esophagogastric junction outflow obstruction, chronic atrial fibrillation, CKD stage III, coronary artery disease presenting with 3-day history of diarrhea.  The patient states he has been having 2-3 watery bowel movements daily.  He was recently discharged from the hospital after stay from 01/05/18 through 01/15/18 during which he was treated for his intractable vomiting.  Esophageal manometry on 01/10/2018 showed esophago-gastric junction outflow obstruction.  The patient was referred to GI at [facility], Dr. Roney Mans.  During the hospitalization, the patient was also treated for aspiration pneumonia.  He was discharged with 7 days of Augmentin.  The patient subsequently followed up with Dr. Roney Mans on 01/20/18.  He recommended a timed barium esophagram to differentiate between gastroesophageal reflux disease and early achalasia; however, it does not appear that the patient followed up with the study.  Nevertheless, the patient continues to tolerate his dysphagia 2 diet without any vomiting.  He denies any fevers, chills, abdominal pain, hematochezia, melena, hematemesis.  He denies any chest pain, shortness breath, hemoptysis, fevers, chills, headache.  He complains of generalized weakness to the point where he is having difficulty getting out of bed and feeling unsteady on his feet.  As result, the patient presented for further evaluation in the emergency department.  He denies any other new medications.  In the emergency department, the patient was hypotensive with systolic blood pressures in the 80s.  He was given 2 L of IV fluid with improvement of his systolic blood pressure into the low  90s.  He was afebrile with oxygen saturation 98% on room air.  BMP showed elevated serum creatinine 4.83 above his baseline.  CBC showed hemoglobin 11.1 with WBC 10.7.  Acute abdominal series was nonobstructive.  Chest x-ray was negative.  CT of the brain was unremarkable.    Assessment/Plan: Acute on chronic renal failure--CKD stage III -Secondary to volume depletion -Baseline creatinine 1.5-1.8 -Presenting creatinine 4.83 -Renal ultrasound -Continue IV fluids -holding furosemide  Diarrhea -Check C. Difficile -stool pathogen panel  Hypotension -Likely secondary to volume depletion -blood cultures x 2 -UA neg for pyuria -CXR--neg for infiltrates -continue IVF -am cortisol -lactate 0.9 -holding coreg  Esophageal stricture/pyloric stenosis-Esophagogastric obstruction -As discussed above 12/24/17 EGD--denudedmucosa todistal esophageal mucosa, portal hypertensive gastropathy, grade 2 mid esophageal varix. -01/10/18--manometry--confirmed esophagogastric obstruction -Continue Reglan -continue PPI -dysphagia 2 diet  Chronic atrial fibrillation -The patient is a poor candidate for anticoagulation secondary to portal hypertensive gastropathy and liver cirrhosis -Continue aspirin and Plavix -Rate controlled -holding carvedilol due to hypotension  NASH/Etoh Cirrohsis -Holding furosemidedue to renal failure  Coronary artery disease -No chest pain -Holdingcarvediloldue to hypotension  Hypothyroidism -Continue Synthroid  Hyperlipidemia -Continue statin  Thrombocytopenia -Chronic secondary liver cirrhosis -am CBC -monitor for bleeding         Past Medical History:  Diagnosis Date  . Adenomatous polyp of colon   . Alcoholism ([place])    [place]  . Arthritis   . Atrial fibrillation ([place])   . Blindness of right eye 1997  . CAD (coronary artery disease)    Multivessel disease at cardiac catheterization June 2018, DES to LAD June 2018 with significant  residual disease including diagonal and obtuse marginal vessels as well as  RCA  . Cirrhosis of liver (HCC)    Associated portal gastropathy  . CKD (chronic kidney disease) stage 3, GFR 30-59 ml/min (HCC)   . Essential hypertension   . Hiatal hernia   . Hypothyroidism   . 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 TIA (transient ischemic attack)    Past Surgical History:  Procedure Laterality Date  . BIOPSY  12/23/2017   Procedure: BIOPSY;  Surgeon: Daneil Dolin, MD;  [facility];  Service: Endoscopy;;  esophagus  . CARDIAC CATHETERIZATION  2003  . COLONOSCOPY  08/2008   normal, repeat exam in 5-7 years  . COLONOSCOPY  2004   rectal adenomatous polyp  . COLONOSCOPY N/A 12/01/2014   [id number] rectum/elongated colon  . CORONARY ATHERECTOMY N/A 05/03/2017   Procedure: Coronary Atherectomy;  Surgeon: Leonie Man, MD;  Location: [place] [facility];  Service: Cardiovascular;  Laterality: N/A;  . CORONARY STENT INTERVENTION N/A 05/03/2017   Procedure: Coronary Stent Intervention;  Surgeon: Leonie Man, MD;  Location: [place] [facility];  Service: Cardiovascular;  Laterality: N/A;  . ESOPHAGEAL DILATION N/A 02/07/2015   Procedure: ESOPHAGEAL DILATION;  Surgeon: Daneil Dolin, MD;  [facility];  Service: Endoscopy;  Laterality: N/A;  . ESOPHAGEAL DILATION N/A 08/17/2017   Procedure: ESOPHAGEAL DILATION;  Surgeon: Danie Binder, MD;  [facility];  Service: Endoscopy;  Laterality: N/A;  . ESOPHAGEAL DILATION N/A 09/23/2017   Procedure: ESOPHAGEAL DILATION;  Surgeon: Danie Binder, MD;  [facility];  Service: Endoscopy;  Laterality: N/A;  . ESOPHAGEAL MANOMETRY N/A 01/10/2018   Procedure: ESOPHAGEAL MANOMETRY (EM);  Surgeon: Danie Binder, MD;  [facility]: [facility];  Service: Endoscopy;  Laterality: N/A;  . ESOPHAGOGASTRODUODENOSCOPY  02/11/2012   Dr. Gala Romney: portal gastropathy, gastric erosions, esophageal ulcerations likely pill-induced,  surveillance in 2 years  . ESOPHAGOGASTRODUODENOSCOPY N/A 12/01/2014   Dr. Volney American esophageal stricture dilated with the scope passage, portal gastropathy, negative H.pylori on gastric biopsies, esophageal biopsies benign  . ESOPHAGOGASTRODUODENOSCOPY N/A 02/07/2015   Procedure: ESOPHAGOGASTRODUODENOSCOPY (EGD);  Surgeon: Daneil Dolin, MD;  [facility];  Service: Endoscopy;  Laterality: N/A;  1115  . ESOPHAGOGASTRODUODENOSCOPY N/A 08/17/2017   benign-appearing esophageal stenosis s/p dilation, mild gastritis, pylorus stenosis s/p dilation  . ESOPHAGOGASTRODUODENOSCOPY N/A 09/23/2017   moderate benign-appearing instrinsic stenosis s/p dilation, mild gastritis  . ESOPHAGOGASTRODUODENOSCOPY N/A 10/18/2017   Benign-appearing esophageal stricture s/p dilation, gastritis, benign-appearing intrinsice moderate pylorus stenosis s/p dilation  . ESOPHAGOGASTRODUODENOSCOPY (EGD) WITH PROPOFOL N/A 12/23/2017   Dr. Gala Romney: single short Grade 2 varix mid esophagus, just above GE junction was nearly circumferential denuding ulcerations/loss of normal mucosal appearance (somewhat punched out) mucosa. Query pill induced injury with path negative for malignancy. Portal hypertensive gastropathy, patent pylorus, normal duodenal bulb and second portion of duodenum, no dilation performed.   Marland Kitchen EYE SURGERY     RIGHT EYE REMOVED  . JOINT REPLACEMENT    . LEFT HEART CATH AND CORONARY ANGIOGRAPHY N/A 05/01/2017   Procedure: Left Heart Cath and Coronary Angiography;  Surgeon: Leonie Man, MD;  Location: [place] [facility];  Service: Cardiovascular;  Laterality: N/A;  . Venia Minks DILATION N/A 10/18/2017   Procedure: Venia Minks DILATION;  Surgeon: Danie Binder, MD;  [facility];  Service: Endoscopy;  Laterality: N/A;  . s/p eye implant  1997   artificial eye, right  . SAVORY DILATION N/A 10/18/2017   Procedure: SAVORY DILATION;  Surgeon: Danie Binder,  MD;  [facility];  Service:  Endoscopy;  Laterality: N/A;  . TOTAL HIP ARTHROPLASTY  2002  . TOTAL HIP ARTHROPLASTY  2006/2012   revision in 2012  . TOTAL KNEE ARTHROPLASTY  1999/2003   left/right   Social History:  reports that he has never smoked. He has never used smokeless tobacco. He reports that he does not drink alcohol or use drugs.   Family History  Problem Relation Age of Onset  . Ovarian cancer Sister   . Colon cancer Neg Hx      No Known Allergies   Prior to Admission medications   Medication Sig Start Date End Date Taking? Authorizing Provider  allopurinol (ZYLOPRIM) 100 MG tablet Take 1 tablet (100 mg total) by mouth daily. Patient taking differently: Take 300 mg by mouth daily.  08/19/17  Yes TatShanon Brow, MD  aspirin 81 MG chewable tablet Chew 81 mg by mouth daily.   Yes [provider]  atorvastatin (LIPITOR) 80 MG tablet Take 80 mg by mouth daily at 6 PM.  09/12/17  Yes [provider]  baclofen (LIORESAL) 10 MG tablet Take 1 tablet by mouth 2 (two) times daily. 12/10/17   [provider]  calcium carbonate (TUMS - DOSED IN MG ELEMENTAL CALCIUM) 500 MG chewable tablet Chew 1 tablet by mouth 2 (two) times daily.    [provider]  carvedilol (COREG) 3.125 MG tablet Take 1 tablet by mouth 2 (two) times daily. 12/20/17   [provider]  cephALEXin (KEFLEX) 500 MG capsule  02/04/18   [provider]  clopidogrel (PLAVIX) 75 MG tablet Take 1 tablet (75 mg total) by mouth daily. 05/07/17   Barton Dubois, MD  colchicine 0.6 MG tablet  02/07/18   [provider]  diclofenac (VOLTAREN) 75 MG EC tablet  02/07/18   [provider]  Ergocalciferol (VITAMIN D2) 400 units TABS Take 1 tablet by mouth 2 (two) times daily.    [provider]  feeding supplement (BOOST / RESOURCE BREEZE) LIQD Take 1 Container by mouth 4 (four) times daily. Patient taking differently: Take 1 Container by mouth daily as needed (does not drink all the  time).  08/18/17   Orson Eva, MD  folic acid (FOLVITE) 1 MG tablet Take 1 tablet (1 mg total) by mouth daily. 05/07/17   Barton Dubois, MD  furosemide (LASIX) 40 MG tablet Take 40 mg by mouth daily. 11/14/17   [provider]  gabapentin (NEURONTIN) 300 MG capsule  02/07/18   [provider]  levothyroxine (SYNTHROID, LEVOTHROID) 50 MCG tablet Take 50 mcg by mouth daily before breakfast.  09/12/17   [provider]  metoCLOPramide (REGLAN) 5 MG tablet 1 po 30 minutes prior to meals bid Patient taking differently: Take 5 mg by mouth 2 (two) times daily before a meal. 1 po 30 minutes prior to meals bid 10/18/17   Fields, Marga Melnick, MD  NON FORMULARY Apply 1 application topically daily as needed (for pain). [place] Gel as needed for arthritis pain     [provider]  omeprazole (PRILOSEC) 40 MG capsule Take 1 capsule (40 mg total) by mouth 2 (two) times daily. Patient taking differently: Take 20 mg by mouth 2 (two) times daily.  03/12/16   Carlis Stable, NP  oxazepam (SERAX) 10 MG capsule  02/08/18   [provider]  polyethylene glycol (MIRALAX / GLYCOLAX) packet Take 17 g daily by mouth. 09/25/17   Kathie Dike, MD  predniSONE (  DELTASONE) 20 MG tablet Take by mouth.    [provider]  traMADol (ULTRAM) 50 MG tablet Take 1 tablet (50 mg total) by mouth every 6 (six) hours as needed. 08/18/17   Orson Eva, MD  vitamin C (ASCORBIC ACID) 500 MG tablet Take 500 mg by mouth daily.    [provider]    Review of Systems:  Constitutional:  No weight loss, night sweats, Fevers, chills, fatigue.  Head&Eyes: No headache.  No vision loss.  No eye pain or scotoma ENT:  No Difficulty swallowing,Tooth/dental problems,Sore throat,  No ear ache, post nasal drip,  Cardio-vascular:  No chest pain, Orthopnea, PND, swelling in lower extremities,  dizziness, palpitations  GI:  No  abdominal pain, nausea, vomiting,  loss of appetite, hematochezia,  melena, heartburn, indigestion, Resp:  No shortness of breath with exertion or at rest. No cough. No coughing up of blood .No wheezing.No chest wall deformity  Skin:  no rash or lesions.  GU:  no dysuria, change in color of urine, no urgency or frequency. No flank pain.  Musculoskeletal:  No joint pain or swelling. No decreased range of motion. No back pain.  Psych:  No change in mood or affect. No depression or anxiety. Neurologic: No headache, no dysesthesia, no focal weakness, no vision loss. No syncope  Physical Exam: Vitals:   02/14/18 1315 02/14/18 1345 02/14/18 1400 02/14/18 1423  BP: (!) 86/65 (!) 84/59 99/64 (!) 95/52  Pulse:  88  88  Resp: 17 12 10 16   Temp:      TempSrc:      SpO2:  94%  94%  Weight:      Height:       General:  A&O x 3, NAD, nontoxic, pleasant/cooperative Head/Eye: No conjunctival hemorrhage, no icterus, [place]/AT, No nystagmus ENT:  No icterus,  No thrush, good dentition, no pharyngeal exudate Neck:  No masses, no lymphadenpathy, no bruits CV:  RRR, no rub, no gallop, no S3 Lung:  CTAB, good air movement, no wheeze, no rhonchi Abdomen: soft/NT, +BS, nondistended, no peritoneal signs Ext: No cyanosis, No rashes, No petechiae, No lymphangitis, No edema Neuro: CNII-XII intact, strength 4/5 in bilateral upper and lower extremities, no dysmetria  Labs on Admission:  Basic Metabolic Panel: Recent Labs  Lab 02/14/18 1122  NA 132*  K 3.6  CL 90*  CO2 26  GLUCOSE 79  BUN 62*  CREATININE 4.83*  CALCIUM 8.1*   Liver Function Tests: Recent Labs  Lab 02/14/18 1122  AST 19  ALT 13*  ALKPHOS 88  BILITOT 0.9  PROT 5.4*  ALBUMIN 2.5*   Recent Labs  Lab 02/14/18 1122  LIPASE 22   No results for input(s): AMMONIA in the last 168 hours. CBC: Recent Labs  Lab 02/14/18 1122  WBC 10.7*  NEUTROABS 7.4  HGB 11.1*  HCT 33.6*  MCV 91.6  PLT 137*   Coagulation Profile: Recent Labs  Lab 02/14/18 1122  INR 1.09   Cardiac Enzymes: Recent  Labs  Lab 02/14/18 1122  TROPONINI <0.03   BNP: Invalid input(s): POCBNP CBG: No results for input(s): GLUCAP in the last 168 hours. Urine analysis:    Component Value Date/Time   COLORURINE STRAW (A) 02/14/2018 1106   APPEARANCEUR CLEAR 02/14/2018 1106   LABSPEC 1.004 (L) 02/14/2018 1106   PHURINE 5.0 02/14/2018 1106   GLUCOSEU NEGATIVE 02/14/2018 1106   HGBUR NEGATIVE 02/14/2018 1106   BILIRUBINUR NEGATIVE 02/14/2018 1106   [place] 02/14/2018 1106   PROTEINUR  NEGATIVE 02/14/2018 1106   UROBILINOGEN 0.2 11/12/2013 2315   NITRITE NEGATIVE 02/14/2018 1106   LEUKOCYTESUR NEGATIVE 02/14/2018 1106   Sepsis Labs: @LABRCNTIP (procalcitonin:4,lacticidven:4) )No results found for this or any previous visit (from the past 240 hour(s)).   Radiological Exams on Admission: Dg Lumbar Spine Complete  Result Date: 02/14/2018 CLINICAL DATA:  Increasing immobility over the last 2 weeks. Several falls. EXAM: LUMBAR SPINE - COMPLETE 4+ VIEW COMPARISON:  Abdominal radiographs 01/05/2018. Abdominopelvic CT 09/20/2017. FINDINGS: Five lumbar type vertebral bodies. The alignment is stable and near anatomic. There is multilevel spondylosis with disc space narrowing most advanced at L1-2. There are paraspinal osteophytes and mild facet hypertrophy. No evidence of acute fracture or pars defect. Bilateral hip arthroplasties and splenic artery atherosclerosis noted. IMPRESSION: No acute findings. Multilevel spondylosis, similar to previous studies. Electronically Signed   By: Richardean Sale M.D.   On: 02/14/2018 12:37   Ct Head Wo Contrast  Result Date: 02/14/2018 CLINICAL DATA:  Head trauma EXAM: CT HEAD WITHOUT CONTRAST TECHNIQUE: Contiguous axial images were obtained from the base of the skull through the vertex without intravenous contrast. COMPARISON:  08/16/2017 FINDINGS: Brain: Diffuse cerebral atrophy. Mild chronic small vessel disease. No acute intracranial abnormality. Specifically, no  hemorrhage, hydrocephalus, mass lesion, acute infarction, or significant intracranial injury. Vascular: No hyperdense vessel or unexpected calcification. Skull: No acute calvarial abnormality. Sinuses/Orbits: Visualized paranasal sinuses and mastoids clear. Orbital soft tissues unremarkable. Other: Locules of gas noted within the soft tissues in the left temporal region which appear to correspond to vessels, likely related to IV access. IMPRESSION: No acute intracranial abnormality. Atrophy, chronic microvascular disease. Electronically Signed   By: Rolm Baptise M.D.   On: 02/14/2018 11:43   Dg Abd Acute W/chest  Result Date: 02/14/2018 CLINICAL DATA:  Diarrhea with weakness and increasing immobility over the last 2 weeks. Several falls. EXAM: DG ABDOMEN ACUTE W/ 1V CHEST COMPARISON:  Radiographs 01/10/2018 and 01/05/2018. FINDINGS: Lordotic positioning on the chest radiograph. The heart appears mildly enlarged, but stable. There is atelectasis at both lung bases, but no confluent airspace opacity, pleural effusion or pneumothorax. Abdominal radiographs are somewhat limited by body habitus. There is no evidence of bowel distention, wall thickening or free air. There are no suspicious abdominal calcifications. Degenerative changes are present throughout the lumbar spine. Patient is status post bilateral total hip arthroplasty. IMPRESSION: No apparent acute findings in the chest, abdomen or pelvis. Cardiomegaly with mild bibasilar atelectasis. Electronically Signed   By: Richardean Sale M.D.   On: 02/14/2018 12:33   Dg Hip Unilat With Pelvis 2-3 Views Right  Result Date: 02/14/2018 CLINICAL DATA:  Increasing immobility over the last 2 weeks. Several falls. EXAM: DG HIP (WITH OR WITHOUT PELVIS) 2-3V RIGHT COMPARISON:  Abdominal radiographs 01/05/2018. Pelvic CT 09/20/2017. FINDINGS: Status post bilateral total hip arthroplasty. There is no evidence of acute fracture or dislocation. The left femoral stem is  incompletely visualized. No evidence of hardware loosening. Degenerative changes are present in the lower lumbar spine. There are mild sacroiliac degenerative changes. IMPRESSION: No acute osseous findings status post bilateral total hip arthroplasty. The left femoral stem is incompletely visualized. Electronically Signed   By: Richardean Sale M.D.   On: 02/14/2018 12:35    EKG: Independently reviewed.  Sinus rhythm, LBBB    Time spent:60 minutes Code Status:   FULL Family Communication:  No Family at bedside Disposition Plan: expect 2-3 day hospitalization Consults called: none DVT Prophylaxis: [place] Heparin     Orson Eva,  DO  [facility] Pager [phone]  If 7PM-7AM, please contact night-coverage [email or web address] Password [facility] 02/14/2018, 2:45 PM

## 2018-02-14 NOTE — ED Triage Notes (Signed)
Pt has been having increasing immobility over the past 2 weeks.  Pt has had several falls since yesterday.  And this morning he is unable to ambulate or sit up.  Not moving right leg.  CBG 96.  B/p 90/60,  EMS gave 400 cc bolus.

## 2018-02-15 ENCOUNTER — Inpatient Hospital Stay (HOSPITAL_COMMUNITY): Payer: Medicare Other

## 2018-02-15 DIAGNOSIS — R5383 Other fatigue: Secondary | ICD-10-CM

## 2018-02-15 DIAGNOSIS — I959 Hypotension, unspecified: Secondary | ICD-10-CM

## 2018-02-15 LAB — COMPREHENSIVE METABOLIC PANEL
ALK PHOS: 98 U/L (ref 38–126)
ALT: 13 U/L — ABNORMAL LOW (ref 17–63)
ANION GAP: 16 — AB (ref 5–15)
AST: 20 U/L (ref 15–41)
Albumin: 2.6 g/dL — ABNORMAL LOW (ref 3.5–5.0)
BILIRUBIN TOTAL: 1 mg/dL (ref 0.3–1.2)
BUN: 61 mg/dL — ABNORMAL HIGH (ref 6–20)
CALCIUM: 8.4 mg/dL — AB (ref 8.9–10.3)
CO2: 25 mmol/L (ref 22–32)
Chloride: 96 mmol/L — ABNORMAL LOW (ref 101–111)
Creatinine, Ser: 3.79 mg/dL — ABNORMAL HIGH (ref 0.61–1.24)
GFR, EST AFRICAN AMERICAN: 16 mL/min — AB (ref >60)
GFR, EST NON AFRICAN AMERICAN: 14 mL/min — AB (ref >60)
GLUCOSE: 74 mg/dL (ref 65–99)
POTASSIUM: 3.6 mmol/L (ref 3.5–5.1)
Sodium: 137 mmol/L (ref 135–145)
TOTAL PROTEIN: 5.9 g/dL — AB (ref 6.5–8.1)

## 2018-02-15 LAB — RAPID URINE DRUG SCREEN, HOSP PERFORMED
AMPHETAMINES: NOT DETECTED
BARBITURATES: NOT DETECTED
Benzodiazepines: NOT DETECTED
COCAINE: NOT DETECTED
OPIATES: NOT DETECTED
TETRAHYDROCANNABINOL: NOT DETECTED

## 2018-02-15 LAB — URINE CULTURE: Culture: <10000 — AB

## 2018-02-15 LAB — BLOOD GAS, ARTERIAL
Acid-base deficit: 1.3 mmol/L (ref 0.0–2.0)
Bicarbonate: 23.5 mmol/L (ref 20.0–28.0)
DRAWN BY: 277331
O2 Content: 2 L/min
O2 Saturation: 94.8 %
PCO2 ART: 35.7 mmHg (ref 32.0–48.0)
PH ART: 7.417 (ref 7.350–7.450)
Patient temperature: 37
pO2, Arterial: 78 mmHg — ABNORMAL LOW (ref 83.0–108.0)

## 2018-02-15 LAB — CBC
HEMATOCRIT: 37.2 % — AB (ref 39.0–52.0)
Hemoglobin: 12.3 g/dL — ABNORMAL LOW (ref 13.0–17.0)
MCH: 30.1 pg (ref 26.0–34.0)
MCHC: 33.1 g/dL (ref 30.0–36.0)
MCV: 91 fL (ref 78.0–100.0)
Platelets: 194 10*3/uL (ref 150–400)
RBC: 4.09 MIL/uL — AB (ref 4.22–5.81)
RDW: 18.4 % — ABNORMAL HIGH (ref 11.5–15.5)
WBC: 13.4 10*3/uL — AB (ref 4.0–10.5)

## 2018-02-15 LAB — C DIFFICILE QUICK SCREEN W PCR REFLEX
C DIFFICILE (CDIFF) TOXIN: NEGATIVE
C DIFFICLE (CDIFF) ANTIGEN: NEGATIVE
C Diff interpretation: NOT DETECTED

## 2018-02-15 LAB — TROPONIN I
Troponin I: <0.03 ng/mL (ref <0.03)
Troponin I: <0.03 ng/mL (ref <0.03)

## 2018-02-15 LAB — CORTISOL-AM, BLOOD: CORTISOL - AM: 12.7 ug/dL (ref 6.7–22.6)

## 2018-02-15 LAB — MAGNESIUM: Magnesium: 1.5 mg/dL — ABNORMAL LOW (ref 1.7–2.4)

## 2018-02-15 LAB — PROCALCITONIN: PROCALCITONIN: 0.6 ng/mL

## 2018-02-15 MED ORDER — MAGNESIUM SULFATE 2 GM/50ML IV SOLN
2.0000 g | Freq: Once | INTRAVENOUS | Status: AC
  Administered 2018-02-15: 2 g via INTRAVENOUS
  Filled 2018-02-15: qty 50

## 2018-02-15 MED ORDER — HYDROCORTISONE NA SUCCINATE PF 100 MG IJ SOLR
50.0000 mg | Freq: Three times a day (TID) | INTRAMUSCULAR | Status: DC
  Administered 2018-02-15 – 2018-02-18 (×9): 50 mg via INTRAVENOUS
  Filled 2018-02-15 (×9): qty 2

## 2018-02-15 MED ORDER — SODIUM CHLORIDE 0.9 % IV BOLUS
1000.0000 mL | Freq: Once | INTRAVENOUS | Status: AC
  Administered 2018-02-15: 1000 mL via INTRAVENOUS

## 2018-02-15 NOTE — Progress Notes (Signed)
PROGRESS NOTE  Justin Parsons JJH:[id number] DOB: [birth year] DOA: 02/14/2018 PCP: Monico Blitz, MD  HPI/Recap of past 24 hours:  Very confused and lethargic this am Per RN it took 5 mins of stimulation to wake his up He is currently oriented to self, know he is in the hospital in [place], not oriented to time  He denies pain, no hypoxia, no fever He had pasty stool x1 per RN, stool study pending, he does not appear in pain  Multiple family at bed side, family reports patient does not sleep well at night, he does sleep through out of the day at baseline, but he is easily arousable at baseline  Assessment/Plan: Active Problems:   GERD (gastroesophageal reflux disease)   Thrombocytopenia ([place])   Acute renal failure superimposed on stage 3 chronic kidney disease ([place])   Hypotension   Alcoholic cirrhosis of liver without ascites ([place])   Atrial fibrillation, chronic ([place])   Diarrhea   Confusion/lethargy: -No fever, blood culture no growth, mrsa screen negative. Per RN patient had bmx1 that is pasty, stool study pending -initial CT no acute findings, stat ABG unremarkable, chronic afib on tele, am random cortisol level unremarkable -unclear etiology of confusion, though he does has persistetn hypotension and ARF, will repeat ct head, get ckg for interval changes, get cycle troponin, get echo,  -continue ivf, fluids boluses , start stress dose steroids -he is currently maintaining airway, per family he has sleep apnea, but could not tolerate cpap, he is DNR   ARF on CKDIII Renal US no acute findings, ua unremarkable Continue ivf, renal dosing meds  Diarrhea -1 pasty stool in hospital, C. difficile and GI PCR panel is pending -No fever, does not appear to have abdominal pain, soft abdomen  Hypotension -Does not appear to have infection, blood culture negative, UA negative, chest x-ray no acute infiltrate -A.m. 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 -01/10/18--manometry--confirmed esophagogastric obstruction -Continue Reglan -continue PPI -dysphagia 2 diet  Chronic atrial fibrillation -The patient is a poor candidate for anticoagulation secondary to portal hypertensive gastropathy and liver cirrhosis -Continue aspirin and Plavix -Rate controlled -holding carvedilol due to hypotension  NASH/Etoh Cirrohsis -Holding furosemidedue to renal failure  -Thrombocytopenia chronic/anemia chronic -monitor  Coronary artery disease -No chest pain -Holdingcarvediloldue to hypotension  Hypothyroidism -Continue Synthroid  Hyperlipidemia -Continue statin    FTT: per HPOA patient has lost a lot weight, this is verified per chart review Patient weigh 287lbs in 04/2017( per family patient was significantly volume overloaded at that time, currently weigh 220lbs Per chart review patient has been hospitalized three times since 04/2017, two times this year Patient was just discharged on 2/27, he is brought back to the hospital due to not able to walk for two weeks  Code Status: DNR  Family Communication: patient at bedside, HPOA Manu Rubey over the phone, he confirmed patient's DNR status  Disposition Plan: remain in the ICU   Consultants:  none  Procedures:  none  Antibiotics:  none   Objective: BP 93/77   Pulse 99   Temp 97.9 F (36.6 C) (Axillary)   Resp 13   Ht 5' 8"  (1.727 m)   Wt 100 kg (220 lb 7.4 oz)   SpO2 96%   BMI 33.52 kg/m   Intake/Output Summary (Last 24 hours) at 02/15/2018 8185 Last data filed  at 02/15/2018 8850 Gross per 24 hour  Intake 1468.33 ml  Output 900 ml  Net 568.33 ml   Filed Weights   02/14/18 1104 02/15/18 0400  Weight:  108.4 kg (239 lb) 100 kg (220 lb 7.4 oz)    Exam: Patient is examined daily including today on 02/15/2018, exams remain the same as of yesterday except that has changed    General:  Lethargic, hard to be arousable   Cardiovascular: IRRR  Respiratory: CTABL  Abdomen: Soft/ND/NT, positive BS  Musculoskeletal: No Edema  Neuro: Lethargic, hard to be arousable   Data Reviewed: Basic Metabolic Panel: Recent Labs  Lab 02/14/18 1122 02/15/18 0515  NA 132* 137  K 3.6 3.6  CL 90* 96*  CO2 26 25  GLUCOSE 79 74  BUN 62* 61*  CREATININE 4.83* 3.79*  CALCIUM 8.1* 8.4*  MG  --  1.5*   Liver Function Tests: Recent Labs  Lab 02/14/18 1122 02/15/18 0515  AST 19 20  ALT 13* 13*  ALKPHOS 88 98  BILITOT 0.9 1.0  PROT 5.4* 5.9*  ALBUMIN 2.5* 2.6*   Recent Labs  Lab 02/14/18 1122  LIPASE 22   No results for input(s): AMMONIA in the last 168 hours. CBC: Recent Labs  Lab 02/14/18 1122 02/15/18 0515  WBC 10.7* 13.4*  NEUTROABS 7.4  --   HGB 11.1* 12.3*  HCT 33.6* 37.2*  MCV 91.6 91.0  PLT 137* 194   Cardiac Enzymes:   Recent Labs  Lab 02/14/18 1122  TROPONINI <0.03   BNP (last 3 results) No results for input(s): BNP in the last 8760 hours.  ProBNP (last 3 results) No results for input(s): PROBNP in the last 8760 hours.  CBG: No results for input(s): GLUCAP in the last 168 hours.  Recent Results (from the past 240 hour(s))  MRSA PCR Screening     Status: None   Collection Time: 02/14/18  6:25 PM  Result Value Ref Range Status   MRSA by PCR NEGATIVE NEGATIVE Final    Comment:        The GeneXpert MRSA Assay (FDA approved for NASAL specimens only), is one component of a comprehensive MRSA colonization surveillance program. It is not intended to diagnose MRSA infection nor to guide or monitor treatment for MRSA infections. Performed at [facility], [address], [place], [place] [address]   Culture, blood (Routine X 2) w Reflex to ID Panel      Status: None (Preliminary result)   Collection Time: 02/14/18  6:55 PM  Result Value Ref Range Status   Specimen Description RIGHT ANTECUBITAL  Final   Special Requests   Final    BOTTLES DRAWN AEROBIC AND ANAEROBIC Blood Culture adequate volume   Culture   Final    NO GROWTH < 12 HOURS Performed at [facility], [address], [place], [place] [address]    Report Status PENDING  Incomplete  Culture, blood (Routine X 2) w Reflex to ID Panel     Status: None (Preliminary result)   Collection Time: 02/14/18  7:05 PM  Result Value Ref Range Status   Specimen Description BLOOD LEFT HAND  Final   Special Requests   Final    BOTTLES DRAWN AEROBIC ONLY Blood Culture adequate volume   Culture   Final    NO GROWTH < 12 HOURS Performed at [facility], [address], [place], [place] [address]    Report Status PENDING  Incomplete     Studies: Dg Lumbar Spine Complete  Result Date: 02/14/2018 CLINICAL DATA:  Increasing immobility over the last 2 weeks. Several falls. EXAM: LUMBAR SPINE - COMPLETE 4+ VIEW COMPARISON:  Abdominal radiographs 01/05/2018. Abdominopelvic CT 09/20/2017. FINDINGS: Five lumbar type vertebral bodies. The alignment is stable and near anatomic. There is multilevel spondylosis with disc space narrowing most advanced at L1-2. There are paraspinal osteophytes and mild facet hypertrophy. No evidence of acute fracture or pars defect. Bilateral hip arthroplasties and splenic artery atherosclerosis noted. IMPRESSION: No acute findings. Multilevel spondylosis, similar to previous studies. Electronically Signed   By: Richardean Sale M.D.   On: 02/14/2018 12:37   Ct Head Wo Contrast  Result Date: 02/14/2018 CLINICAL DATA:  Head trauma EXAM: CT HEAD WITHOUT CONTRAST TECHNIQUE: Contiguous axial images were obtained from the base of the skull through the vertex without intravenous contrast. COMPARISON:  08/16/2017 FINDINGS: Brain: Diffuse cerebral atrophy. Mild chronic small vessel  disease. No acute intracranial abnormality. Specifically, no hemorrhage, hydrocephalus, mass lesion, acute infarction, or significant intracranial injury. Vascular: No hyperdense vessel or unexpected calcification. Skull: No acute calvarial abnormality. Sinuses/Orbits: Visualized paranasal sinuses and mastoids clear. Orbital soft tissues unremarkable. Other: Locules of gas noted within the soft tissues in the left temporal region which appear to correspond to vessels, likely related to IV access. IMPRESSION: No acute intracranial abnormality. Atrophy, chronic microvascular disease. Electronically Signed   By: Rolm Baptise M.D.   On: 02/14/2018 11:43   Dg Abd Acute W/chest  Result Date: 02/14/2018 CLINICAL DATA:  Diarrhea with weakness and increasing immobility over the last 2 weeks. Several falls. EXAM: DG ABDOMEN ACUTE W/ 1V CHEST COMPARISON:  Radiographs 01/10/2018 and 01/05/2018. FINDINGS: Lordotic positioning on the chest radiograph. The heart appears mildly enlarged, but stable. There is atelectasis at both lung bases, but no confluent airspace opacity, pleural effusion or pneumothorax. Abdominal radiographs are somewhat limited by body habitus. There is no evidence of bowel distention, wall thickening or free air. There are no suspicious abdominal calcifications. Degenerative changes are present throughout the lumbar spine. Patient is status post bilateral total hip arthroplasty. IMPRESSION: No apparent acute findings in the chest, abdomen or pelvis. Cardiomegaly with mild bibasilar atelectasis. Electronically Signed   By: Richardean Sale M.D.   On: 02/14/2018 12:33   Dg Hip Unilat With Pelvis 2-3 Views Right  Result Date: 02/14/2018 CLINICAL DATA:  Increasing immobility over the last 2 weeks. Several falls. EXAM: DG HIP (WITH OR WITHOUT PELVIS) 2-3V RIGHT COMPARISON:  Abdominal radiographs 01/05/2018. Pelvic CT 09/20/2017. FINDINGS: Status post bilateral total hip arthroplasty. There is no evidence of  acute fracture or dislocation. The left femoral stem is incompletely visualized. No evidence of hardware loosening. Degenerative changes are present in the lower lumbar spine. There are mild sacroiliac degenerative changes. IMPRESSION: No acute osseous findings status post bilateral total hip arthroplasty. The left femoral stem is incompletely visualized. Electronically Signed   By: Richardean Sale M.D.   On: 02/14/2018 12:35    Scheduled Meds: . allopurinol  100 mg Oral Daily  . aspirin  81 mg Oral Daily  . atorvastatin  80 mg Oral q1800  . baclofen  10 mg Oral BID  . calcium carbonate  1 tablet Oral BID  . clopidogrel  75 mg Oral Daily  . feeding supplement  1 Container Oral QID  . folic acid  1 mg Oral Daily  . heparin  5,000 Units Subcutaneous Q8H  . levothyroxine  50 mcg Oral QAC breakfast  . metoCLOPramide  5 mg Oral BID AC  .  pantoprazole  40 mg Oral Daily    Continuous Infusions: . sodium chloride Stopped (02/14/18 1840)  . 0.9 % NaCl with KCl 20 mEq / L 100 mL/hr at 02/15/18 0635  . magnesium sulfate 1 - 4 g bolus IVPB       Time spent: 62mns I have personally reviewed and interpreted on  02/15/2018 daily labs, tele strips, imagings as discussed above under date review session and assessment and plans.  I reviewed all nursing notes, pharmacy notes,  vitals, pertinent old records  I have discussed plan of care as described above with RN , patient and family on 02/15/2018   FFlorencia ReasonsMD, PhD  [facility] Pager [phone] If 7PM-7AM, please contact night-coverage at [email or web address], password [email or web address]02/15/2018, 9:37 AM  LOS: 1 day

## 2018-02-16 ENCOUNTER — Inpatient Hospital Stay (HOSPITAL_COMMUNITY): Payer: Medicare Other

## 2018-02-16 DIAGNOSIS — I4891 Unspecified atrial fibrillation: Secondary | ICD-10-CM

## 2018-02-16 LAB — GASTROINTESTINAL PANEL BY PCR, STOOL (REPLACES STOOL CULTURE)
ASTROVIRUS: NOT DETECTED
Adenovirus F40/41: NOT DETECTED
CRYPTOSPORIDIUM: NOT DETECTED
Campylobacter species: NOT DETECTED
Cyclospora cayetanensis: NOT DETECTED
ENTEROAGGREGATIVE E COLI (EAEC): NOT DETECTED
ENTEROTOXIGENIC E COLI (ETEC): NOT DETECTED
Entamoeba histolytica: NOT DETECTED
Enteropathogenic E coli (EPEC): NOT DETECTED
GIARDIA LAMBLIA: NOT DETECTED
Norovirus GI/GII: NOT DETECTED
Plesimonas shigelloides: NOT DETECTED
ROTAVIRUS A: NOT DETECTED
Salmonella species: NOT DETECTED
Sapovirus (I, II, IV, and V): NOT DETECTED
Shiga like toxin producing E coli (STEC): NOT DETECTED
Shigella/Enteroinvasive E coli (EIEC): NOT DETECTED
VIBRIO SPECIES: NOT DETECTED
Vibrio cholerae: NOT DETECTED
YERSINIA ENTEROCOLITICA: NOT DETECTED

## 2018-02-16 LAB — CBC
HCT: 33.2 % — ABNORMAL LOW (ref 39.0–52.0)
HEMOGLOBIN: 10.8 g/dL — AB (ref 13.0–17.0)
MCH: 29.9 pg (ref 26.0–34.0)
MCHC: 32.5 g/dL (ref 30.0–36.0)
MCV: 92 fL (ref 78.0–100.0)
Platelets: 153 10*3/uL (ref 150–400)
RBC: 3.61 MIL/uL — AB (ref 4.22–5.81)
RDW: 18.3 % — ABNORMAL HIGH (ref 11.5–15.5)
WBC: 10.9 10*3/uL — ABNORMAL HIGH (ref 4.0–10.5)

## 2018-02-16 LAB — BLOOD GAS, ARTERIAL
Acid-base deficit: 2.5 mmol/L — ABNORMAL HIGH (ref 0.0–2.0)
Bicarbonate: 22.6 mmol/L (ref 20.0–28.0)
Drawn by: 277331
O2 Content: 2 L/min
O2 Saturation: 98.2 %
PCO2 ART: 34.3 mmHg (ref 32.0–48.0)
PH ART: 7.412 (ref 7.350–7.450)
PO2 ART: 129 mmHg — AB (ref 83.0–108.0)
Patient temperature: 37

## 2018-02-16 LAB — BASIC METABOLIC PANEL
Anion gap: 9 (ref 5–15)
BUN: 58 mg/dL — ABNORMAL HIGH (ref 6–20)
CALCIUM: 8.7 mg/dL — AB (ref 8.9–10.3)
CHLORIDE: 105 mmol/L (ref 101–111)
CO2: 24 mmol/L (ref 22–32)
CREATININE: 3.04 mg/dL — AB (ref 0.61–1.24)
GFR calc Af Amer: 21 mL/min — ABNORMAL LOW (ref >60)
GFR, EST NON AFRICAN AMERICAN: 18 mL/min — AB (ref >60)
GLUCOSE: 154 mg/dL — AB (ref 65–99)
POTASSIUM: 3.9 mmol/L (ref 3.5–5.1)
Sodium: 138 mmol/L (ref 135–145)

## 2018-02-16 LAB — TROPONIN I: Troponin I: <0.03 ng/mL (ref <0.03)

## 2018-02-16 LAB — ECHOCARDIOGRAM COMPLETE
Height: 68 in
WEIGHTICAEL: 3523.83 [oz_av]

## 2018-02-16 LAB — AMMONIA: Ammonia: 10 umol/L (ref 9–35)

## 2018-02-16 LAB — GLUCOSE, CAPILLARY: Glucose-Capillary: 140 mg/dL — ABNORMAL HIGH (ref 65–99)

## 2018-02-16 LAB — PROCALCITONIN: Procalcitonin: 0.34 ng/mL

## 2018-02-16 LAB — VITAMIN B12: VITAMIN B 12: 755 pg/mL (ref 180–914)

## 2018-02-16 LAB — FOLATE: FOLATE: 69.6 ng/mL (ref >5.9)

## 2018-02-16 MED ORDER — LACTULOSE ENEMA
300.0000 mL | Freq: Once | ORAL | Status: AC
  Administered 2018-02-16: 300 mL via RECTAL
  Filled 2018-02-16: qty 300

## 2018-02-16 MED ORDER — DOPAMINE-DEXTROSE 3.2-5 MG/ML-% IV SOLN: 0.0000 ug/kg/min | INTRAVENOUS | Status: DC

## 2018-02-16 MED ORDER — SODIUM CHLORIDE 0.9 % IV BOLUS
1000.0000 mL | Freq: Once | INTRAVENOUS | Status: AC
  Administered 2018-02-16: 1000 mL via INTRAVENOUS

## 2018-02-16 NOTE — Progress Notes (Signed)
Pt very lethargic all night. Hard to arouse at times. Pt would not answer orientation questions when asked but while cleaning pt due to him having a BM, he carried on a conversation with this RN and NT.  At beginning of shift patient would not swallow his pills. Pt held pills in his mouth and had to be prompted to swallow several times. Will not give 6am PO meds due to this and pt's drowsy state. BP stable all night.

## 2018-02-16 NOTE — Progress Notes (Signed)
*  PRELIMINARY RESULTS* Echocardiogram 2D Echocardiogram has been performed.  Justin Parsons 02/16/2018, 11:50 AM

## 2018-02-16 NOTE — Progress Notes (Addendum)
PROGRESS NOTE  ZYKEE AVAKIAN BWI:[id number] DOB: 1941-06-22 DOA: 02/14/2018 PCP: Monico Blitz, MD  HPI/Recap of past 24 hours:  Blood pressure is improving, he appear more awake this am, he open his eyes spontaneously, he does not talk, he does follow commands but inconsistently He eat 70-80% of his breakfast this am   He denies pain, no hypoxia, no fever He had pasty stool x2 per RN, cdiff negative, gi pcr pending, abdomen soft on exam  HPOA james at bedside   Assessment/Plan: Active Problems:   GERD (gastroesophageal reflux disease)   Thrombocytopenia ([facility])   Acute renal failure superimposed on stage 3 chronic kidney disease ([place])   Hypotension   Alcoholic cirrhosis of liver without ascites ([place])   Atrial fibrillation, chronic ([place])   Diarrhea   Confusion/lethargy: -likely metabolic encephalopathy in the setting of arf/uremia -No fever, blood culture no growth, mrsa screen negative. Per RN patient had bmx1 that is pasty, cdiff negative, gi pcr in process -CT head x2 no acute findings, mri pending, will add eeg -stat ABG unremarkable - chronic afib on tele, troponin negative, echo pending -ammonia unremarkable, rpr/b12/folate /hiv pending -am random cortisol level unremarkable -unclear etiology of confusion, though he does has persistetn hypotension initially and ARF,  -continue ivf, fluids boluses , start stress dose steroids -he is currently maintaining airway, per family he has sleep apnea, but could not tolerate cpap, he is DNR  Addendum: bp remain low, patient remain very hard to arouse, will start dopamine drip Remain encephalopathic, there is documented cirrhosis, although ammonia level is unremarkable, will try a dose of lactulose enema  as well.  ARF on CKDIII Renal US no acute findings, ua unremarkable Continue ivf, renal dosing meds Slowly improving  Diarrhea -pasty stool in hospital, C. difficile negative and GI PCR panel is pending -No fever,  does not appear to have abdominal pain, soft abdomen  Hypotension -Does not appear to have infection, blood culture negative, UA negative, chest x-ray no acute infiltrate -A.m. cortisol unremarkable, lactic acid unremarkable -Hold Coreg, continue IV fluids, start stress dose steroid -bp improving, echo pending  Esophageal stricture/pyloric stenosis-Esophagogastric obstruction -As discussed above 2/5/19EGD--denudedmucosa todistal esophageal mucosa, portal hypertensive gastropathy, grade 2 mid esophageal varix. -01/10/18--manometry--confirmed esophagogastric obstruction -Continue Reglan -continue PPI -dysphagia 2 diet  Chronic atrial fibrillation -The patient is a poor candidate for anticoagulation secondary to portal hypertensive gastropathy and liver cirrhosis -Continue aspirin and Plavix -Rate controlled -holding carvedilol due to hypotension  NASH/Etoh Cirrohsis -lft /ammonia unremarkable -Holding furosemidedue to renal failure  -Thrombocytopenia chronic/anemia chronic -monitor  Coronary artery disease -No chest pain, troponin negative  -Holdingcarvediloldue to hypotension  Hypothyroidism -Continue Synthroid  Hyperlipidemia -Continue statin    FTT: per HPOA patient has lost a lot weight, this is verified per chart review Patient weigh 287lbs in 04/2017( per family patient was significantly volume overloaded at that time, currently weigh 220lbs Per chart review patient has been hospitalized three times since 04/2017, two times this year Patient was just discharged on 2/27, he is brought back to the hospital due to not able to walk for two weeks  Code Status: DNR  Family Communication: patient at bedside, HPOA Heron Pitcock at bedside, he confirmed patient's DNR status  Disposition Plan: remain in the ICU   Consultants:  none  Procedures:  none  Antibiotics:  none   Objective: BP (!) 122/99   Pulse 69   Temp (!) 97.5 F (36.4 C)  (Oral)   Resp 17   Ht  5' 8"  (1.727 m)   Wt 99.9 kg (220 lb 3.8 oz)   SpO2 (!) 88%   BMI 33.49 kg/m   Intake/Output Summary (Last 24 hours) at 02/16/2018 0909 Last data filed at 02/16/2018 1275 Gross per 24 hour  Intake 3346.67 ml  Output 650 ml  Net 2696.67 ml   Filed Weights   02/14/18 1104 02/15/18 0400 02/16/18 0500  Weight: 108.4 kg (239 lb) 100 kg (220 lb 7.4 oz) 99.9 kg (220 lb 3.8 oz)    Exam: Patient is examined daily including today on 02/16/2018, exams remain the same as of yesterday except that has changed    General:  Lethargic, hard to be arousable   Cardiovascular: IRRR  Respiratory: CTABL  Abdomen: Soft/ND/NT, positive BS  Musculoskeletal: No Edema  Neuro: Lethargic, hard to be arousable   Data Reviewed: Basic Metabolic Panel: Recent Labs  Lab 02/14/18 1122 02/15/18 0515 02/16/18 0339  NA 132* 137 138  K 3.6 3.6 3.9  CL 90* 96* 105  CO2 26 25 24   GLUCOSE 79 74 154*  BUN 62* 61* 58*  CREATININE 4.83* 3.79* 3.04*  CALCIUM 8.1* 8.4* 8.7*  MG  --  1.5*  --    Liver Function Tests: Recent Labs  Lab 02/14/18 1122 02/15/18 0515  AST 19 20  ALT 13* 13*  ALKPHOS 88 98  BILITOT 0.9 1.0  PROT 5.4* 5.9*  ALBUMIN 2.5* 2.6*   Recent Labs  Lab 02/14/18 1122  LIPASE 22   Recent Labs  Lab 02/16/18 0339  AMMONIA 10   CBC: Recent Labs  Lab 02/14/18 1122 02/15/18 0515 02/16/18 0339  WBC 10.7* 13.4* 10.9*  NEUTROABS 7.4  --   --   HGB 11.1* 12.3* 10.8*  HCT 33.6* 37.2* 33.2*  MCV 91.6 91.0 92.0  PLT 137* 194 153   Cardiac Enzymes:   Recent Labs  Lab 02/14/18 1122 02/15/18 1538 02/15/18 2018 02/16/18 0339  TROPONINI <0.03 <0.03 <0.03 <0.03   BNP (last 3 results) No results for input(s): BNP in the last 8760 hours.  ProBNP (last 3 results) No results for input(s): PROBNP in the last 8760 hours.  CBG: Recent Labs  Lab 02/16/18 0759  GLUCAP 140*    Recent Results (from the past 240 hour(s))  Urine culture     Status:  Abnormal   Collection Time: 02/14/18 11:08 AM  Result Value Ref Range Status   Specimen Description   Final    URINE, CLEAN CATCH Performed at [facility], [address], [place], [place] [address]    Special Requests   Final    NONE Performed at [facility], [address] [place], [place] [address]    Culture (A)  Final    <10,000 COLONIES/mL Performed at [facility], [place] [address] [place], [place] [address]    Report Status 02/15/2018 FINAL  Final  MRSA PCR Screening     Status: None   Collection Time: 02/14/18  6:25 PM  Result Value Ref Range Status   MRSA by PCR NEGATIVE NEGATIVE Final    Comment:        The GeneXpert MRSA Assay (FDA approved for NASAL specimens only), is one component of a comprehensive MRSA colonization surveillance program. It is not intended to diagnose MRSA infection nor to guide or monitor treatment for MRSA infections. Performed at [facility], [address], [place], [place] [address]   Culture, blood (Routine X 2) w Reflex to ID Panel  Status: None (Preliminary result)   Collection Time: 02/14/18  6:55 PM  Result Value Ref Range Status   Specimen Description RIGHT ANTECUBITAL  Final   Special Requests   Final    BOTTLES DRAWN AEROBIC AND ANAEROBIC Blood Culture adequate volume   Culture   Final    NO GROWTH 2 DAYS Performed at [facility], [address], [place], [place] [address]    Report Status PENDING  Incomplete  Culture, blood (Routine X 2) w Reflex to ID Panel     Status: None (Preliminary result)   Collection Time: 02/14/18  7:05 PM  Result Value Ref Range Status   Specimen Description BLOOD LEFT HAND  Final   Special Requests   Final    BOTTLES DRAWN AEROBIC ONLY Blood Culture adequate volume   Culture   Final    NO GROWTH 2 DAYS Performed at [facility], [address] [place], [place] [address]    Report Status PENDING  Incomplete  C difficile quick scan w PCR reflex     Status: None   Collection Time:  02/15/18  3:20 PM  Result Value Ref Range Status   C Diff antigen NEGATIVE NEGATIVE Final   C Diff toxin NEGATIVE NEGATIVE Final   C Diff interpretation No C. difficile detected.  Final    Comment: Performed at [facility], [address] [place], [place] [address]     Studies: Ct Head Wo Contrast  Result Date: 02/15/2018 CLINICAL DATA:  Altered mental status. EXAM: CT HEAD WITHOUT CONTRAST TECHNIQUE: Contiguous axial images were obtained from the base of the skull through the vertex without intravenous contrast. COMPARISON:  02/14/2018 FINDINGS: Brain: There is no evidence of acute infarct, intracranial hemorrhage, mass, midline shift, or extra-axial fluid collection. There is moderate cerebral atrophy. Periventricular white matter hypodensities are unchanged and nonspecific but compatible with mild chronic small vessel ischemic disease. Vascular: Calcified atherosclerosis at the skull base mild vertebrobasilar dolichoectasia. No hyperdense vessel. Skull: No fracture or suspicious osseous lesion. Sinuses/Orbits: Old left orbital floor fracture. Minimal bilateral ethmoid air cell and right maxillary sinus mucosal thickening. Clear mastoid air cells. Right ocular prosthesis. Left cataract extraction. Other: None. IMPRESSION: 1. No evidence of acute intracranial abnormality. 2. Moderate cerebral atrophy and mild chronic small vessel ischemic disease. Electronically Signed   By: Logan Bores M.D.   On: 02/15/2018 18:14   US Renal  Result Date: 02/15/2018 CLINICAL DATA:  Acute on chronic renal failure EXAM: RENAL / URINARY TRACT ULTRASOUND COMPLETE COMPARISON:  Abdominal CT 09/20/2017 FINDINGS: Right Kidney: Length: 10 cm. Smooth cortical thinning. No hydronephrosis or evidence of mass. Left Kidney: Length: 9 cm. Cortical thinning. No hydronephrosis or evidence of mass. Bladder: Decompressed.  No detected abnormality. IMPRESSION: 1. No hydronephrosis. 2. Symmetric renal atrophy. Electronically Signed    By: Monte Fantasia M.D.   On: 02/15/2018 09:58    Scheduled Meds: . allopurinol  100 mg Oral Daily  . aspirin  81 mg Oral Daily  . atorvastatin  80 mg Oral q1800  . baclofen  10 mg Oral BID  . calcium carbonate  1 tablet Oral BID  . clopidogrel  75 mg Oral Daily  . feeding supplement  1 Container Oral QID  . folic acid  1 mg Oral Daily  . heparin  5,000 Units Subcutaneous Q8H  . hydrocortisone sod succinate (SOLU-CORTEF) inj  50 mg Intravenous Q8H  . levothyroxine  50 mcg Oral QAC breakfast  . metoCLOPramide  5 mg Oral BID  AC  . pantoprazole  40 mg Oral Daily    Continuous Infusions: . sodium chloride Stopped (02/14/18 1840)  . 0.9 % NaCl with KCl 20 mEq / L 100 mL/hr at 02/16/18 7473     Time spent: 51mns I have personally reviewed and interpreted on  02/16/2018 daily labs, tele strips, imagings as discussed above under date review session and assessment and plans.  I reviewed all nursing notes, pharmacy notes,  vitals, pertinent old records  I have discussed plan of care as described above with RN , patient and family on 02/16/2018   FFlorencia ReasonsMD, PhD  [facility] Pager [phone] If 7PM-7AM, please contact night-coverage at [email or web address], password [email or web address]3/31/2019, 9:09 AM  LOS: 2 days

## 2018-02-17 ENCOUNTER — Inpatient Hospital Stay (HOSPITAL_COMMUNITY)
Admit: 2018-02-17 | Discharge: 2018-02-17 | Disposition: A | Discharge status: Home / Self Care | Payer: Medicare Other | Attending: Internal Medicine | Admitting: Internal Medicine

## 2018-02-17 ENCOUNTER — Inpatient Hospital Stay (HOSPITAL_COMMUNITY): Payer: Medicare Other

## 2018-02-17 DIAGNOSIS — R531 Weakness: Secondary | ICD-10-CM

## 2018-02-17 DIAGNOSIS — G9340 Encephalopathy, unspecified: Secondary | ICD-10-CM

## 2018-02-17 LAB — BASIC METABOLIC PANEL
ANION GAP: 10 (ref 5–15)
BUN: 47 mg/dL — AB (ref 6–20)
CALCIUM: 9.4 mg/dL (ref 8.9–10.3)
CO2: 22 mmol/L (ref 22–32)
Chloride: 112 mmol/L — ABNORMAL HIGH (ref 101–111)
Creatinine, Ser: 2.24 mg/dL — ABNORMAL HIGH (ref 0.61–1.24)
GFR calc Af Amer: 31 mL/min — ABNORMAL LOW (ref >60)
GFR, EST NON AFRICAN AMERICAN: 26 mL/min — AB (ref >60)
Glucose, Bld: 114 mg/dL — ABNORMAL HIGH (ref 65–99)
Potassium: 3.9 mmol/L (ref 3.5–5.1)
SODIUM: 144 mmol/L (ref 135–145)

## 2018-02-17 LAB — TSH: TSH: 2.412 u[IU]/mL (ref 0.350–4.500)

## 2018-02-17 LAB — CBC
HCT: 37.5 % — ABNORMAL LOW (ref 39.0–52.0)
Hemoglobin: 11.7 g/dL — ABNORMAL LOW (ref 13.0–17.0)
MCH: 29.1 pg (ref 26.0–34.0)
MCHC: 31.2 g/dL (ref 30.0–36.0)
MCV: 93.3 fL (ref 78.0–100.0)
PLATELETS: 189 10*3/uL (ref 150–400)
RBC: 4.02 MIL/uL — AB (ref 4.22–5.81)
RDW: 18.5 % — AB (ref 11.5–15.5)
WBC: 12.6 10*3/uL — AB (ref 4.0–10.5)

## 2018-02-17 LAB — HIV ANTIBODY (ROUTINE TESTING W REFLEX): HIV Screen 4th Generation wRfx: NONREACTIVE

## 2018-02-17 LAB — RPR: RPR: NONREACTIVE

## 2018-02-17 MED ORDER — LACTULOSE 10 GM/15ML PO SOLN
20.0000 g | Freq: Two times a day (BID) | ORAL | Status: DC
  Administered 2018-02-17 – 2018-02-20 (×7): 20 g via ORAL
  Filled 2018-02-17 (×7): qty 30

## 2018-02-17 NOTE — Progress Notes (Signed)
EEG completed; results pending.    

## 2018-02-17 NOTE — Progress Notes (Signed)
PROGRESS NOTE  Justin Parsons QBH:[id number] DOB: 1941/02/10 DOA: 02/14/2018 PCP: Monico Blitz, MD  Brief History:   77 y.o. male with medical history of NASH +/- Etoh cirrhosis,esophageal stricture, pyloric stenosis, esophagogastric junction outflow obstruction, chronic atrial fibrillation, CKD stage III, coronary artery disease presenting with 3-day history of diarrhea.  The patient states he has been having 2-3 watery bowel movements daily.  He was recently discharged from the hospital after stay from 01/05/18 through 01/15/18 during which he was treated for his intractable vomiting.  Esophageal manometry on 01/10/2018 showed esophago-gastric junction outflow obstruction.  The patient was referred to GI at [facility], Dr. Roney Mans.  During the hospitalization, the patient was also treated for aspiration pneumonia.  He was discharged with 7 days of Augmentin.  The patient subsequently followed up with Dr. Roney Mans on 01/20/18.  He recommended a timed barium esophagram to differentiate between gastroesophageal reflux disease and early achalasia; however, it does not appear that the patient followed up with the study.  Nevertheless, the patient continues to tolerate his dysphagia 2 diet without any vomiting. At the time of this admission, the patient was noted to have acute on chronic renal failure with serum creatinine peaking at 4.83.  The patient became increasingly somnolent.  Metabolic workup was essentially negative.  MRI of the brain was negative.  Despite ammonia in the normal range, the patient was treated empirically for hepatic encephalopathy with lactulose enema.  After lactulose, the patient's mental status gradually improved.   Assessment/Plan: Acute metabolic encephalopathy/hepatic encephalopathy -CT brain negative x2 -MRI brain negative for acute findings -RPR, serum B12, TSH, ammonia unremarkable -ABG without hypoxia or hypercarbia -Patient empirically treated for hepatic  encephalopathy with lactulose enema--> mental status gradually improving -Continue lactulose po -EEG mild to moderate diffuse slowing without any epileptiform discharges -Urinalysis negative for pyuria -d/c baclofen  Acute on chronic renal failure--CKD stage III -Secondary to volume depletion -Baseline creatinine 1.5-1.8 -Presenting creatinine 4.83 -Renal ultrasound--negative for hydronephrosis -Continue IV fluids -holding furosemide  Diarrhea -Check C. Difficile--neg -stool pathogen panel--neg -now due to lactulose  Hypotension -Likely secondary to volume depletion -blood cultures x 2--negative -UA neg for pyuria -CXR--neg for infiltrates -am cortisol--12.7 -lactate 0.9 -holding coreg -Improving with IV fluids -wean solucortef  Esophageal stricture/pyloric stenosis-Esophagogastric obstruction -As discussed above 2/5/19EGD--denudedmucosa todistal esophageal mucosa, portal hypertensive gastropathy, grade 2 mid esophageal varix. -01/10/18--manometry--confirmed esophagogastric obstruction -Continue Reglan -continue PPI -dysphagia 2 diet  Chronic atrial fibrillation -The patient is a poor candidate for anticoagulation secondary to portal hypertensive gastropathy and liver cirrhosis -Continue aspirin and Plavix -Rate up a little due to holding BB -holding carvedilol due to hypotension  NASH/Etoh Cirrohsis -Holding furosemidedue to renal failure  Coronary artery disease -No chest pain -Holdingcarvediloldue to hypotension  Hypothyroidism -Continue Synthroid  Hyperlipidemia -Continue statin  Thrombocytopenia -Chronic secondary liver cirrhosis -am CBC -monitor for bleeding  GOC -pt changed to DNR    Disposition Plan:   Home in 1-2 days  Family Communication:   Family at bedside updated 4/1  Consultants:  none  Code Status:  DNR  DVT Prophylaxis:  SCDs   Procedures: As Listed in Progress Note  Above  Antibiotics: None    Subjective: Patient denies fevers, chills, headache, chest pain, dyspnea, nausea, vomiting,  abdominal pain, dysuria, hematuria, hematochezia, and melena. Positive loose stools   Objective: Vitals:   02/17/18 0300 02/17/18 0400 02/17/18 0500 02/17/18 0600  BP: 110/89 (!) 156/134 (!) 128/91 (!) 125/99  Pulse:   (!) 41 (!) 26  Resp:  (!) 21 16 16   Temp:  97.9 F (36.6 C)    TempSrc:  Axillary    SpO2: 100%  100% 97%  Weight:   102.9 kg (226 lb 13.7 oz)   Height:        Intake/Output Summary (Last 24 hours) at 02/17/2018 1315 Last data filed at 02/17/2018 0500 Gross per 24 hour  Intake 500 ml  Output 1500 ml  Net -1000 ml   Weight change: 3 kg (6 lb 9.8 oz) Exam:   General:  Pt is alert, follows commands appropriately, not in acute distress  HEENT: No icterus, No thrush, No neck mass, [place]/AT  Cardiovascular: IRRR, S1/S2, no rubs, no gallops  Respiratory: Bibasilar rales.  No wheezing.  Good air movement.  Abdomen: Soft/+BS, non tender, non distended, no guarding  Extremities: trace LE edema, No lymphangitis, No petechiae, No rashes, no synovitis   Data Reviewed: I have personally reviewed following labs and imaging studies Basic Metabolic Panel: Recent Labs  Lab 02/14/18 1122 02/15/18 0515 02/16/18 0339 02/17/18 0442  NA 132* 137 138 144  K 3.6 3.6 3.9 3.9  CL 90* 96* 105 112*  CO2 26 25 24 22   GLUCOSE 79 74 154* 114*  BUN 62* 61* 58* 47*  CREATININE 4.83* 3.79* 3.04* 2.24*  CALCIUM 8.1* 8.4* 8.7* 9.4  MG  --  1.5*  --   --    Liver Function Tests: Recent Labs  Lab 02/14/18 1122 02/15/18 0515  AST 19 20  ALT 13* 13*  ALKPHOS 88 98  BILITOT 0.9 1.0  PROT 5.4* 5.9*  ALBUMIN 2.5* 2.6*   Recent Labs  Lab 02/14/18 1122  LIPASE 22   Recent Labs  Lab 02/16/18 0339  AMMONIA 10   Coagulation Profile: Recent Labs  Lab 02/14/18 1122  INR 1.09   CBC: Recent Labs  Lab 02/14/18 1122 02/15/18 0515 02/16/18 0339  02/17/18 0442  WBC 10.7* 13.4* 10.9* 12.6*  NEUTROABS 7.4  --   --   --   HGB 11.1* 12.3* 10.8* 11.7*  HCT 33.6* 37.2* 33.2* 37.5*  MCV 91.6 91.0 92.0 93.3  PLT 137* 194 153 189   Cardiac Enzymes: Recent Labs  Lab 02/14/18 1122 02/15/18 1538 02/15/18 2018 02/16/18 0339  TROPONINI <0.03 <0.03 <0.03 <0.03   BNP: Invalid input(s): POCBNP CBG: Recent Labs  Lab 02/16/18 0759  GLUCAP 140*   HbA1C: No results for input(s): HGBA1C in the last 72 hours. Urine analysis:    Component Value Date/Time   COLORURINE STRAW (A) 02/14/2018 1106   APPEARANCEUR CLEAR 02/14/2018 1106   LABSPEC 1.004 (L) 02/14/2018 1106   PHURINE 5.0 02/14/2018 1106   GLUCOSEU NEGATIVE 02/14/2018 1106   HGBUR NEGATIVE 02/14/2018 1106   BILIRUBINUR NEGATIVE 02/14/2018 1106   KETONESUR NEGATIVE 02/14/2018 1106   PROTEINUR NEGATIVE 02/14/2018 1106   UROBILINOGEN 0.2 11/12/2013 2315   NITRITE NEGATIVE 02/14/2018 1106   LEUKOCYTESUR NEGATIVE 02/14/2018 1106   Sepsis Labs: @LABRCNTIP (procalcitonin:4,lacticidven:4) ) Recent Results (from the past 240 hour(s))  Urine culture     Status: Abnormal   Collection Time: 02/14/18 11:08 AM  Result Value Ref Range Status   Specimen Description   Final    URINE, CLEAN CATCH Performed at [facility], [address], [place], [place] [address]    Special Requests   Final    NONE Performed at [facility], [address] [place], [place] [address]    Culture (A)  Final    <10,000 COLONIES/mL Performed at [facility], [place] [address], [place], [place] [address]    Report Status 02/15/2018 FINAL  Final  MRSA PCR Screening     Status: None   Collection Time: 02/14/18  6:25 PM  Result Value Ref Range Status   MRSA by PCR NEGATIVE NEGATIVE Final    Comment:        The GeneXpert MRSA Assay (FDA approved for NASAL specimens only), is one component of a comprehensive MRSA colonization surveillance program. It is not intended to diagnose MRSA infection  nor to guide or monitor treatment for MRSA infections. Performed at [facility], [address] [place], [place] [address]   Culture, blood (Routine X 2) w Reflex to ID Panel     Status: None (Preliminary result)   Collection Time: 02/14/18  6:55 PM  Result Value Ref Range Status   Specimen Description RIGHT ANTECUBITAL  Final   Special Requests   Final    BOTTLES DRAWN AEROBIC AND ANAEROBIC Blood Culture adequate volume   Culture   Final    NO GROWTH 3 DAYS Performed at [facility], [address], [place], [place] [address]    Report Status PENDING  Incomplete  Culture, blood (Routine X 2) w Reflex to ID Panel     Status: None (Preliminary result)   Collection Time: 02/14/18  7:05 PM  Result Value Ref Range Status   Specimen Description BLOOD LEFT HAND  Final   Special Requests   Final    BOTTLES DRAWN AEROBIC ONLY Blood Culture adequate volume   Culture   Final    NO GROWTH 3 DAYS Performed at [facility], [address] [place], [place] [address]    Report Status PENDING  Incomplete  Gastrointestinal Panel by PCR , Stool     Status: None   Collection Time: 02/15/18  3:49 AM  Result Value Ref Range Status   Campylobacter species NOT DETECTED NOT DETECTED Final   Plesimonas shigelloides NOT DETECTED NOT DETECTED Final   Salmonella species NOT DETECTED NOT DETECTED Final   Yersinia enterocolitica NOT DETECTED NOT DETECTED Final   Vibrio species NOT DETECTED NOT DETECTED Final   Vibrio cholerae NOT DETECTED NOT DETECTED Final   Enteroaggregative E coli (EAEC) NOT DETECTED NOT DETECTED Final   Enteropathogenic E coli (EPEC) NOT DETECTED NOT DETECTED Final   Enterotoxigenic E coli (ETEC) NOT DETECTED NOT DETECTED Final   Shiga like toxin producing E coli (STEC) NOT DETECTED NOT DETECTED Final   Shigella/Enteroinvasive E coli (EIEC) NOT DETECTED NOT DETECTED Final   Cryptosporidium NOT DETECTED NOT DETECTED Final   Cyclospora cayetanensis NOT DETECTED NOT DETECTED Final    Entamoeba histolytica NOT DETECTED NOT DETECTED Final   Giardia lamblia NOT DETECTED NOT DETECTED Final   Adenovirus F40/41 NOT DETECTED NOT DETECTED Final   Astrovirus NOT DETECTED NOT DETECTED Final   Norovirus GI/GII NOT DETECTED NOT DETECTED Final   Rotavirus A NOT DETECTED NOT DETECTED Final   Sapovirus (I, II, IV, and V) NOT DETECTED NOT DETECTED Final    Comment: Performed at [facility], [address] [place], [place] [address]  C difficile quick scan w PCR reflex     Status: None   Collection Time: 02/15/18  3:20 PM  Result Value Ref Range Status   C Diff antigen NEGATIVE NEGATIVE Final   C Diff toxin NEGATIVE NEGATIVE Final   C Diff interpretation No C. difficile detected.  Final  Comment: Performed at [facility], [address], [place], [place] [address]     Scheduled Meds: . allopurinol  100 mg Oral Daily  . aspirin  81 mg Oral Daily  . atorvastatin  80 mg Oral q1800  . calcium carbonate  1 tablet Oral BID  . clopidogrel  75 mg Oral Daily  . feeding supplement  1 Container Oral QID  . folic acid  1 mg Oral Daily  . heparin  5,000 Units Subcutaneous Q8H  . hydrocortisone sod succinate (SOLU-CORTEF) inj  50 mg Intravenous Q8H  . levothyroxine  50 mcg Oral QAC breakfast  . metoCLOPramide  5 mg Oral BID AC  . pantoprazole  40 mg Oral Daily   Continuous Infusions: . 0.9 % NaCl with KCl 20 mEq / L 100 mL/hr at 02/17/18 0272    Procedures/Studies: Dg Lumbar Spine Complete  Result Date: 02/14/2018 CLINICAL DATA:  Increasing immobility over the last 2 weeks. Several falls. EXAM: LUMBAR SPINE - COMPLETE 4+ VIEW COMPARISON:  Abdominal radiographs 01/05/2018. Abdominopelvic CT 09/20/2017. FINDINGS: Five lumbar type vertebral bodies. The alignment is stable and near anatomic. There is multilevel spondylosis with disc space narrowing most advanced at L1-2. There are paraspinal osteophytes and mild facet hypertrophy. No evidence of acute fracture or pars defect.  Bilateral hip arthroplasties and splenic artery atherosclerosis noted. IMPRESSION: No acute findings. Multilevel spondylosis, similar to previous studies. Electronically Signed   By: Richardean Sale M.D.   On: 02/14/2018 12:37   Ct Head Wo Contrast  Result Date: 02/15/2018 CLINICAL DATA:  Altered mental status. EXAM: CT HEAD WITHOUT CONTRAST TECHNIQUE: Contiguous axial images were obtained from the base of the skull through the vertex without intravenous contrast. COMPARISON:  02/14/2018 FINDINGS: Brain: There is no evidence of acute infarct, intracranial hemorrhage, mass, midline shift, or extra-axial fluid collection. There is moderate cerebral atrophy. Periventricular white matter hypodensities are unchanged and nonspecific but compatible with mild chronic small vessel ischemic disease. Vascular: Calcified atherosclerosis at the skull base mild vertebrobasilar dolichoectasia. No hyperdense vessel. Skull: No fracture or suspicious osseous lesion. Sinuses/Orbits: Old left orbital floor fracture. Minimal bilateral ethmoid air cell and right maxillary sinus mucosal thickening. Clear mastoid air cells. Right ocular prosthesis. Left cataract extraction. Other: None. IMPRESSION: 1. No evidence of acute intracranial abnormality. 2. Moderate cerebral atrophy and mild chronic small vessel ischemic disease. Electronically Signed   By: Logan Bores M.D.   On: 02/15/2018 18:14   Ct Head Wo Contrast  Result Date: 02/14/2018 CLINICAL DATA:  Head trauma EXAM: CT HEAD WITHOUT CONTRAST TECHNIQUE: Contiguous axial images were obtained from the base of the skull through the vertex without intravenous contrast. COMPARISON:  08/16/2017 FINDINGS: Brain: Diffuse cerebral atrophy. Mild chronic small vessel disease. No acute intracranial abnormality. Specifically, no hemorrhage, hydrocephalus, mass lesion, acute infarction, or significant intracranial injury. Vascular: No hyperdense vessel or unexpected calcification. Skull: No  acute calvarial abnormality. Sinuses/Orbits: Visualized paranasal sinuses and mastoids clear. Orbital soft tissues unremarkable. Other: Locules of gas noted within the soft tissues in the left temporal region which appear to correspond to vessels, likely related to IV access. IMPRESSION: No acute intracranial abnormality. Atrophy, chronic microvascular disease. Electronically Signed   By: Rolm Baptise M.D.   On: 02/14/2018 11:43   Mr Jodene Nam Head Wo Contrast  Result Date: 02/17/2018 CLINICAL DATA:  Altered mental status. Unable to walk. Incontinence. History of cirrhosis and atrial fibrillation. EXAM: MRI HEAD WITHOUT CONTRAST MRA HEAD WITHOUT CONTRAST TECHNIQUE: Multiplanar, multiecho pulse sequences of the  brain and surrounding structures were obtained without intravenous contrast. Angiographic images of the head were obtained using MRA technique without contrast. COMPARISON:  Head CT 02/15/2018 and MRI/MRA 04/27/2017 FINDINGS: MRI HEAD FINDINGS The study is mildly to moderately motion degraded. Brain: There is no evidence of acute infarct, intracranial hemorrhage, mass, midline shift, or extra-axial fluid collection. There is moderate cerebral atrophy which is unchanged from the prior MRI. Periventricular white matter T2 hyperintensities are also unchanged and nonspecific but compatible with mild chronic small vessel ischemic disease. Vascular: Major intracranial vascular flow voids are preserved. Skull and upper cervical spine: No suspicious marrow lesion. Sinuses/Orbits: Right ocular prosthesis. Remote left orbital floor fracture. Paranasal sinuses and mastoid air cells are clear. Other: None. MRA HEAD FINDINGS The study is severely motion degraded which limits assessment for arterial stenoses. The visualized distal vertebral arteries are patent to the basilar with the left being slightly larger than the right. There is apparent moderate narrowing of both distal V4 segments at the vertebrobasilar junction,  although motion may be artifactually contributing to this appearance. The basilar artery is patent without definite significant stenosis when accounting for motion artifact. The PCAs are completely obscured by motion. The intracranial internal carotid arteries are patent but are intermittently completely obscured by motion. The ACAs and MCAs are largely obscured by motion artifact, although gross flow is evident in the proximal M1 and A1 segments bilaterally. No large aneurysm is identified. IMPRESSION: 1. Motion degraded examination without evidence of acute intracranial abnormality. 2. Moderate cerebral atrophy and mild chronic small vessel ischemic disease. 3. Severely motion degraded MRA. Grossly patent large vessels at the base of the brain as above. Electronically Signed   By: Logan Bores M.D.   On: 02/17/2018 08:48   Mr Brain Wo Contrast  Result Date: 02/17/2018 CLINICAL DATA:  Altered mental status. Unable to walk. Incontinence. History of cirrhosis and atrial fibrillation. EXAM: MRI HEAD WITHOUT CONTRAST MRA HEAD WITHOUT CONTRAST TECHNIQUE: Multiplanar, multiecho pulse sequences of the brain and surrounding structures were obtained without intravenous contrast. Angiographic images of the head were obtained using MRA technique without contrast. COMPARISON:  Head CT 02/15/2018 and MRI/MRA 04/27/2017 FINDINGS: MRI HEAD FINDINGS The study is mildly to moderately motion degraded. Brain: There is no evidence of acute infarct, intracranial hemorrhage, mass, midline shift, or extra-axial fluid collection. There is moderate cerebral atrophy which is unchanged from the prior MRI. Periventricular white matter T2 hyperintensities are also unchanged and nonspecific but compatible with mild chronic small vessel ischemic disease. Vascular: Major intracranial vascular flow voids are preserved. Skull and upper cervical spine: No suspicious marrow lesion. Sinuses/Orbits: Right ocular prosthesis. Remote left orbital floor  fracture. Paranasal sinuses and mastoid air cells are clear. Other: None. MRA HEAD FINDINGS The study is severely motion degraded which limits assessment for arterial stenoses. The visualized distal vertebral arteries are patent to the basilar with the left being slightly larger than the right. There is apparent moderate narrowing of both distal V4 segments at the vertebrobasilar junction, although motion may be artifactually contributing to this appearance. The basilar artery is patent without definite significant stenosis when accounting for motion artifact. The PCAs are completely obscured by motion. The intracranial internal carotid arteries are patent but are intermittently completely obscured by motion. The ACAs and MCAs are largely obscured by motion artifact, although gross flow is evident in the proximal M1 and A1 segments bilaterally. No large aneurysm is identified. IMPRESSION: 1. Motion degraded examination without evidence of acute intracranial abnormality. 2. Moderate  cerebral atrophy and mild chronic small vessel ischemic disease. 3. Severely motion degraded MRA. Grossly patent large vessels at the base of the brain as above. Electronically Signed   By: Logan Bores M.D.   On: 02/17/2018 08:48   US Renal  Result Date: 02/15/2018 CLINICAL DATA:  Acute on chronic renal failure EXAM: RENAL / URINARY TRACT ULTRASOUND COMPLETE COMPARISON:  Abdominal CT 09/20/2017 FINDINGS: Right Kidney: Length: 10 cm. Smooth cortical thinning. No hydronephrosis or evidence of mass. Left Kidney: Length: 9 cm. Cortical thinning. No hydronephrosis or evidence of mass. Bladder: Decompressed.  No detected abnormality. IMPRESSION: 1. No hydronephrosis. 2. Symmetric renal atrophy. Electronically Signed   By: Monte Fantasia M.D.   On: 02/15/2018 09:58   Dg Chest Port 1 View  Result Date: 02/16/2018 CLINICAL DATA:  Shortness of breath. EXAM: PORTABLE CHEST 1 VIEW COMPARISON:  Radiograph of February 14, 2018. FINDINGS: Stable  cardiomegaly. No pneumothorax is noted. Mild central pulmonary vascular congestion is noted. No significant pleural effusion is noted. No consolidative process is noted. Bony thorax is unremarkable. IMPRESSION: Stable cardiomegaly with central pulmonary vascular congestion. Electronically Signed   By: Marijo Conception, M.D.   On: 02/16/2018 19:14   Dg Abd Acute W/chest  Result Date: 02/14/2018 CLINICAL DATA:  Diarrhea with weakness and increasing immobility over the last 2 weeks. Several falls. EXAM: DG ABDOMEN ACUTE W/ 1V CHEST COMPARISON:  Radiographs 01/10/2018 and 01/05/2018. FINDINGS: Lordotic positioning on the chest radiograph. The heart appears mildly enlarged, but stable. There is atelectasis at both lung bases, but no confluent airspace opacity, pleural effusion or pneumothorax. Abdominal radiographs are somewhat limited by body habitus. There is no evidence of bowel distention, wall thickening or free air. There are no suspicious abdominal calcifications. Degenerative changes are present throughout the lumbar spine. Patient is status post bilateral total hip arthroplasty. IMPRESSION: No apparent acute findings in the chest, abdomen or pelvis. Cardiomegaly with mild bibasilar atelectasis. Electronically Signed   By: Richardean Sale M.D.   On: 02/14/2018 12:33   Dg Hip Unilat With Pelvis 2-3 Views Right  Result Date: 02/14/2018 CLINICAL DATA:  Increasing immobility over the last 2 weeks. Several falls. EXAM: DG HIP (WITH OR WITHOUT PELVIS) 2-3V RIGHT COMPARISON:  Abdominal radiographs 01/05/2018. Pelvic CT 09/20/2017. FINDINGS: Status post bilateral total hip arthroplasty. There is no evidence of acute fracture or dislocation. The left femoral stem is incompletely visualized. No evidence of hardware loosening. Degenerative changes are present in the lower lumbar spine. There are mild sacroiliac degenerative changes. IMPRESSION: No acute osseous findings status post bilateral total hip arthroplasty.  The left femoral stem is incompletely visualized. Electronically Signed   By: Richardean Sale M.D.   On: 02/14/2018 12:35    Orson Eva, DO  [facility] Pager [phone]  If 7PM-7AM, please contact night-coverage [email or web address] Password [email or web address] 02/17/2018, 1:15 PM   LOS: 3 days

## 2018-02-17 NOTE — Procedures (Signed)
ELECTROENCEPHALOGRAM REPORT  Date of Study: 02/17/2018  Patient's Name: Justin Parsons MRN: [id number] Date of Birth: December 11, 1940  Referring Provider: Dr. Florencia Reasons  Clinical History: This is a 77 year old man with altered mental status.  Medications: acetaminophen (TYLENOL) tablet 650 mg  allopurinol (ZYLOPRIM) tablet 100 mg  aspirin chewable tablet 81 mg  atorvastatin (LIPITOR) tablet 80 mg  baclofen (LIORESAL) tablet 10 mg  calcium carbonate (TUMS - dosed in mg elemental calcium) chewable tablet 200 mg of elemental calcium  clopidogrel (PLAVIX) tablet 75 mg  DOPamine (INTROPIN) 800 mg in dextrose 5 % 250 mL (3.2 mg/mL) infusion  feeding supplement (BOOST / RESOURCE BREEZE) liquid 1 Container  folic acid (FOLVITE) tablet 1 mg  heparin injection 5,000 Units  hydrocortisone sodium succinate (SOLU-CORTEF) 100 MG injection 50 mg  levothyroxine (SYNTHROID, LEVOTHROID) tablet 50 mcg  metoCLOPramide (REGLAN) tablet 5 mg  pantoprazole (PROTONIX) EC tablet 40 mg  traMADol (ULTRAM) tablet 50 mg   Technical Summary: A multichannel digital EEG recording measured by the international 10-20 system with electrodes applied with paste and impedances below 5000 ohms performed as portable with EKG monitoring in an awake and asleep patient.  Hyperventilation and photic stimulation were not performed.  The digital EEG was referentially recorded, reformatted, and digitally filtered in a variety of bipolar and referential montages for optimal display.   Description: The patient is awake and asleep during the recording.  During maximal wakefulness, there is a poorly sustained medium voltage 7 Hz posterior dominant rhythm that poorly attenuates with eye opening and eye closure. This is admixed with a moderate amount of diffuse 4-5 Hz theta and occasional 2-3 Hz delta slowing of the waking background.  During drowsiness and sleep, there is an increase in theta and delta slowing of the background with  occasional vertex waves seen.  Hyperventilation and photic stimulation were not performed.  There were no epileptiform discharges or electrographic seizures seen.    EKG lead showed frequent extrasystolic beats.  Impression: This awake and asleep EEG is abnormal due to mild to moderate diffuse slowing of the waking background.  Clinical Correlation of the above findings indicates diffuse cerebral dysfunction that is non-specific in etiology and can be seen with hypoxic/ischemic injury, toxic/metabolic encephalopathies, neurodegenerative disorders, or medication effect.  The absence of epileptiform discharges does not rule out a clinical diagnosis of epilepsy.  Clinical correlation is advised.   Ellouise Newer, M.D.

## 2018-02-18 LAB — BASIC METABOLIC PANEL
ANION GAP: 8 (ref 5–15)
BUN: 42 mg/dL — ABNORMAL HIGH (ref 6–20)
CALCIUM: 9.1 mg/dL (ref 8.9–10.3)
CO2: 20 mmol/L — AB (ref 22–32)
Chloride: 115 mmol/L — ABNORMAL HIGH (ref 101–111)
Creatinine, Ser: 1.81 mg/dL — ABNORMAL HIGH (ref 0.61–1.24)
GFR calc non Af Amer: 34 mL/min — ABNORMAL LOW (ref >60)
GFR, EST AFRICAN AMERICAN: 40 mL/min — AB (ref >60)
Glucose, Bld: 133 mg/dL — ABNORMAL HIGH (ref 65–99)
POTASSIUM: 4.1 mmol/L (ref 3.5–5.1)
Sodium: 143 mmol/L (ref 135–145)

## 2018-02-18 LAB — CBC
HEMATOCRIT: 33.6 % — AB (ref 39.0–52.0)
HEMOGLOBIN: 10.7 g/dL — AB (ref 13.0–17.0)
MCH: 29.6 pg (ref 26.0–34.0)
MCHC: 31.8 g/dL (ref 30.0–36.0)
MCV: 92.8 fL (ref 78.0–100.0)
Platelets: 144 10*3/uL — ABNORMAL LOW (ref 150–400)
RBC: 3.62 MIL/uL — AB (ref 4.22–5.81)
RDW: 18.6 % — AB (ref 11.5–15.5)
WBC: 16.6 10*3/uL — AB (ref 4.0–10.5)

## 2018-02-18 MED ORDER — ZOLPIDEM TARTRATE 5 MG PO TABS
5.0000 mg | ORAL_TABLET | Freq: Every evening | ORAL | Status: DC | PRN
  Administered 2018-02-18 (×2): 5 mg via ORAL
  Filled 2018-02-18 (×2): qty 1

## 2018-02-18 MED ORDER — DILTIAZEM HCL-DEXTROSE 100-5 MG/100ML-% IV SOLN (PREMIX)
5.0000 mg/h | INTRAVENOUS | Status: DC
  Administered 2018-02-18 – 2018-02-19 (×2): 5 mg/h via INTRAVENOUS
  Filled 2018-02-18 (×2): qty 100

## 2018-02-18 MED ORDER — CARVEDILOL 3.125 MG PO TABS
3.1250 mg | ORAL_TABLET | Freq: Two times a day (BID) | ORAL | Status: DC
  Administered 2018-02-18 – 2018-02-20 (×4): 3.125 mg via ORAL
  Filled 2018-02-18 (×4): qty 1

## 2018-02-18 MED ORDER — PREDNISONE 20 MG PO TABS
40.0000 mg | ORAL_TABLET | Freq: Two times a day (BID) | ORAL | Status: DC
  Administered 2018-02-18 – 2018-02-20 (×4): 40 mg via ORAL
  Filled 2018-02-18 (×4): qty 2

## 2018-02-18 NOTE — Progress Notes (Signed)
PROGRESS NOTE  Justin Parsons:[id number] DOB: 1941-02-07 DOA: 02/14/2018 PCP: Monico Blitz, MD   Brief History:  77 y.o.malewith medical history ofNASH +/- Etoh cirrhosis,esophageal stricture, pyloric stenosis, esophagogastric junction outflow obstruction,chronic atrial fibrillation, CKD stage III, coronary artery disease presenting with 3-day history of diarrhea. The patient states he has been having 2-3 watery bowel movements daily. He was recently discharged from the hospital after stay from 01/05/18 through 01/15/18 during which he was treated for his intractable vomiting. Esophageal manometry on 01/10/2018 showed esophago-gastric junction outflow obstruction. The patient was referred to GI at [facility], Dr. Roney Mans.During the hospitalization, the patient was also treated for aspiration pneumonia. He was discharged with 7 days of Augmentin. The patient subsequently followed up with Dr. Roney Mans on 01/20/18. He recommended atimed barium esophagram to differentiate betweengastroesophageal reflux disease and early achalasia;however, it does not appear that the patient followed up with the study. Nevertheless, the patient continues to tolerate his dysphagia 2 diet without any vomiting. At the time of this admission, the patient was noted to have acute on chronic renal failure with serum creatinine peaking at 4.83.  The patient became increasingly somnolent.  Metabolic workup was essentially negative.  MRI of the brain was negative.  Despite ammonia in the normal range, the patient was treated empirically for hepatic encephalopathy with lactulose enema.  After lactulose, the patient's mental status gradually improved.  Unfortunately, on the afternoon of February 18, 2018, the patient developed RVR from his atrial fibrillation.  The patient was started back on carvedilol, and a diltiazem drip was started.   Assessment/Plan: Acute metabolic encephalopathy/hepatic encephalopathy -CT  brain negative x2 -MRI brain negative for acute findings -RPR, serum B12, TSH, ammonia unremarkable -ABG without hypoxia or hypercarbia -Patient empirically treated for hepatic encephalopathy with lactulose enema--> mental status improving -Continue lactulose po -EEG mild to moderate diffuse slowing without any epileptiform discharges -Urinalysis negative for pyuria -d/c baclofen  Acute on chronic renal failure--CKD stage III -Secondary to volume depletion -Baseline creatinine 1.5-1.8 -Presenting creatinine 4.83 -Renal ultrasound--negative for hydronephrosis -Continue IV fluids-->improved -holding furosemide  Hypotension -Likely secondary to volume depletion -blood cultures x 2--negative -UA neg for pyuria -CXR--neg for infiltrates -am cortisol--12.7 -lactate 0.9 -holding coreg -Improving with IV fluids -wean solucortef  Chronic atrial fibrillation--Afib with RVR -The patient is a poor candidate for anticoagulation secondary to portal hypertensive gastropathy and liver cirrhosis -Continue aspirin and Plavix -4/2--HR went up to 160s -restart coreg -start diltiazem drip -01/29/18--Echo--EF 55-60%, mild AI/MR PASP 46  Esophageal stricture/pyloric stenosis-Esophagogastric obstruction -As discussed above 2/5/19EGD--denudedmucosa todistal esophageal mucosa, portal hypertensive gastropathy, grade 2 mid esophageal varix. -01/10/18--manometry--confirmed esophagogastric obstruction -Continue Reglan -continue PPI -dysphagia 2 diet  Diarrhea -Check C. Difficile--neg -stool pathogen panel--neg -now due to lactulose  NASH/Etoh Cirrohsis -Holding furosemidedue to renal failure  Coronary artery disease -No chest pain -Holdingcarvediloldue tohypotension  Hypothyroidism -Continue Synthroid  Hyperlipidemia -Continue statin  Thrombocytopenia -Chronic secondary liver cirrhosis -am CBC -monitor for bleeding  GOC -pt changed to DNR    Disposition  Plan:  SNF 4/3 or 4/4 if HR better controlled Family Communication:   Family at bedside updated 4/1  Consultants:  none  Code Status:  DNR  DVT Prophylaxis:  SCDs   Procedures: As Listed in Progress Note Above  Antibiotics: None       Subjective: Patient denies fevers, chills, headache, chest pain, dyspnea, nausea, vomiting, diarrhea, abdominal pain, dysuria, hematuria, hematochezia, and melena.   Objective: Vitals:  02/18/18 0405 02/18/18 0500 02/18/18 0600 02/18/18 0800  BP: 121/78  123/87   Pulse: (!) 101  (!) 122   Resp: (!) 27  (!) 27   Temp:    (!) 97.4 F (36.3 C)  TempSrc:    Oral  SpO2: 96%  95%   Weight:  108.8 kg (239 lb 13.8 oz)    Height:        Intake/Output Summary (Last 24 hours) at 02/18/2018 1517 Last data filed at 02/18/2018 0600 Gross per 24 hour  Intake 4953.33 ml  Output 1350 ml  Net 3603.33 ml   Weight change: 5.9 kg (13 lb 0.1 oz) Exam:   General:  Pt is alert, follows commands appropriately, not in acute distress  HEENT: No icterus, No thrush, No neck mass, [place]/AT  Cardiovascular: IRRR, S1/S2, no rubs, no gallops  Respiratory: Bibasilar crackles.  No wheezing.  Good air movement  Abdomen: Soft/+BS, non tender, non distended, no guarding  Extremities: trace LE edema, No lymphangitis, No petechiae, No rashes, no synovitis   Data Reviewed: I have personally reviewed following labs and imaging studies Basic Metabolic Panel: Recent Labs  Lab 02/14/18 1122 02/15/18 0515 02/16/18 0339 02/17/18 0442 02/18/18 0530  NA 132* 137 138 144 143  K 3.6 3.6 3.9 3.9 4.1  CL 90* 96* 105 112* 115*  CO2 26 25 24 22  20*  GLUCOSE 79 74 154* 114* 133*  BUN 62* 61* 58* 47* 42*  CREATININE 4.83* 3.79* 3.04* 2.24* 1.81*  CALCIUM 8.1* 8.4* 8.7* 9.4 9.1  MG  --  1.5*  --   --   --    Liver Function Tests: Recent Labs  Lab 02/14/18 1122 02/15/18 0515  AST 19 20  ALT 13* 13*  ALKPHOS 88 98  BILITOT 0.9 1.0  PROT 5.4* 5.9*    ALBUMIN 2.5* 2.6*   Recent Labs  Lab 02/14/18 1122  LIPASE 22   Recent Labs  Lab 02/16/18 0339  AMMONIA 10   Coagulation Profile: Recent Labs  Lab 02/14/18 1122  INR 1.09   CBC: Recent Labs  Lab 02/14/18 1122 02/15/18 0515 02/16/18 0339 02/17/18 0442 02/18/18 0530  WBC 10.7* 13.4* 10.9* 12.6* 16.6*  NEUTROABS 7.4  --   --   --   --   HGB 11.1* 12.3* 10.8* 11.7* 10.7*  HCT 33.6* 37.2* 33.2* 37.5* 33.6*  MCV 91.6 91.0 92.0 93.3 92.8  PLT 137* 194 153 189 144*   Cardiac Enzymes: Recent Labs  Lab 02/14/18 1122 02/15/18 1538 02/15/18 2018 02/16/18 0339  TROPONINI <0.03 <0.03 <0.03 <0.03   BNP: Invalid input(s): POCBNP CBG: Recent Labs  Lab 02/16/18 0759  GLUCAP 140*   HbA1C: No results for input(s): HGBA1C in the last 72 hours. Urine analysis:    Component Value Date/Time   COLORURINE STRAW (A) 02/14/2018 1106   APPEARANCEUR CLEAR 02/14/2018 1106   LABSPEC 1.004 (L) 02/14/2018 1106   PHURINE 5.0 02/14/2018 1106   GLUCOSEU NEGATIVE 02/14/2018 1106   HGBUR NEGATIVE 02/14/2018 1106   BILIRUBINUR NEGATIVE 02/14/2018 1106   KETONESUR NEGATIVE 02/14/2018 1106   PROTEINUR NEGATIVE 02/14/2018 1106   UROBILINOGEN 0.2 11/12/2013 2315   NITRITE NEGATIVE 02/14/2018 1106   LEUKOCYTESUR NEGATIVE 02/14/2018 1106   Sepsis Labs: @LABRCNTIP (procalcitonin:4,lacticidven:4) ) Recent Results (from the past 240 hour(s))  Urine culture     Status: Abnormal   Collection Time: 02/14/18 11:08 AM  Result Value Ref Range Status   Specimen Description   Final    URINE, CLEAN  CATCH Performed at [facility], [address], [place], [place] [address]    Special Requests   Final    NONE Performed at [facility], [address], [place], [place] [address]    Culture (A)  Final    <10,000 COLONIES/mL Performed at [facility], [place] [address] [place], [place] [address]    Report Status 02/15/2018 FINAL  Final  MRSA PCR Screening     Status: None   Collection  Time: 02/14/18  6:25 PM  Result Value Ref Range Status   MRSA by PCR NEGATIVE NEGATIVE Final    Comment:        The GeneXpert MRSA Assay (FDA approved for NASAL specimens only), is one component of a comprehensive MRSA colonization surveillance program. It is not intended to diagnose MRSA infection nor to guide or monitor treatment for MRSA infections. Performed at [facility], [address] [place], [place] [address]   Culture, blood (Routine X 2) w Reflex to ID Panel     Status: None (Preliminary result)   Collection Time: 02/14/18  6:55 PM  Result Value Ref Range Status   Specimen Description RIGHT ANTECUBITAL  Final   Special Requests   Final    BOTTLES DRAWN AEROBIC AND ANAEROBIC Blood Culture adequate volume   Culture   Final    NO GROWTH 4 DAYS Performed at [facility], [address], [place], [place] [address]    Report Status PENDING  Incomplete  Culture, blood (Routine X 2) w Reflex to ID Panel     Status: None (Preliminary result)   Collection Time: 02/14/18  7:05 PM  Result Value Ref Range Status   Specimen Description BLOOD LEFT HAND  Final   Special Requests   Final    BOTTLES DRAWN AEROBIC ONLY Blood Culture adequate volume   Culture   Final    NO GROWTH 4 DAYS Performed at [facility], [address] [place],  [address]    Report Status PENDING  Incomplete  Gastrointestinal Panel by PCR , Stool     Status: None   Collection Time: 02/15/18  3:49 AM  Result Value Ref Range Status   Campylobacter species NOT DETECTED NOT DETECTED Final   Plesimonas shigelloides NOT DETECTED NOT DETECTED Final   Salmonella species NOT DETECTED NOT DETECTED Final   Yersinia enterocolitica NOT DETECTED NOT DETECTED Final   Vibrio species NOT DETECTED NOT DETECTED Final   Vibrio cholerae NOT DETECTED NOT DETECTED Final   Enteroaggregative E coli (EAEC) NOT DETECTED NOT DETECTED Final   Enteropathogenic E coli (EPEC) NOT DETECTED NOT DETECTED Final   Enterotoxigenic  E coli (ETEC) NOT DETECTED NOT DETECTED Final   Shiga like toxin producing E coli (STEC) NOT DETECTED NOT DETECTED Final   Shigella/Enteroinvasive E coli (EIEC) NOT DETECTED NOT DETECTED Final   Cryptosporidium NOT DETECTED NOT DETECTED Final   Cyclospora cayetanensis NOT DETECTED NOT DETECTED Final   Entamoeba histolytica NOT DETECTED NOT DETECTED Final   Giardia lamblia NOT DETECTED NOT DETECTED Final   Adenovirus F40/41 NOT DETECTED NOT DETECTED Final   Astrovirus NOT DETECTED NOT DETECTED Final   Norovirus GI/GII NOT DETECTED NOT DETECTED Final   Rotavirus A NOT DETECTED NOT DETECTED Final   Sapovirus (I, II, IV, and V) NOT DETECTED NOT DETECTED Final    Comment: Performed at [facility], [address] [place],  [address]  C difficile quick scan w PCR reflex     Status: None  Collection Time: 02/15/18  3:20 PM  Result Value Ref Range Status   C Diff antigen NEGATIVE NEGATIVE Final   C Diff toxin NEGATIVE NEGATIVE Final   C Diff interpretation No C. difficile detected.  Final    Comment: Performed at [facility], [address], [place], [place] [address]     Scheduled Meds: . allopurinol  100 mg Oral Daily  . aspirin  81 mg Oral Daily  . atorvastatin  80 mg Oral q1800  . calcium carbonate  1 tablet Oral BID  . carvedilol  3.125 mg Oral BID WC  . clopidogrel  75 mg Oral Daily  . feeding supplement  1 Container Oral QID  . folic acid  1 mg Oral Daily  . heparin  5,000 Units Subcutaneous Q8H  . lactulose  20 g Oral BID  . levothyroxine  50 mcg Oral QAC breakfast  . metoCLOPramide  5 mg Oral BID AC  . pantoprazole  40 mg Oral Daily  . predniSONE  40 mg Oral BID WC   Continuous Infusions: . 0.9 % NaCl with KCl 20 mEq / L 100 mL/hr at 02/18/18 1204    Procedures/Studies: Dg Lumbar Spine Complete  Result Date: 02/14/2018 CLINICAL DATA:  Increasing immobility over the last 2 weeks. Several falls. EXAM: LUMBAR SPINE - COMPLETE 4+ VIEW COMPARISON:   Abdominal radiographs 01/05/2018. Abdominopelvic CT 09/20/2017. FINDINGS: Five lumbar type vertebral bodies. The alignment is stable and near anatomic. There is multilevel spondylosis with disc space narrowing most advanced at L1-2. There are paraspinal osteophytes and mild facet hypertrophy. No evidence of acute fracture or pars defect. Bilateral hip arthroplasties and splenic artery atherosclerosis noted. IMPRESSION: No acute findings. Multilevel spondylosis, similar to previous studies. Electronically Signed   By: Richardean Sale M.D.   On: 02/14/2018 12:37   Ct Head Wo Contrast  Result Date: 02/15/2018 CLINICAL DATA:  Altered mental status. EXAM: CT HEAD WITHOUT CONTRAST TECHNIQUE: Contiguous axial images were obtained from the base of the skull through the vertex without intravenous contrast. COMPARISON:  02/14/2018 FINDINGS: Brain: There is no evidence of acute infarct, intracranial hemorrhage, mass, midline shift, or extra-axial fluid collection. There is moderate cerebral atrophy. Periventricular white matter hypodensities are unchanged and nonspecific but compatible with mild chronic small vessel ischemic disease. Vascular: Calcified atherosclerosis at the skull base mild vertebrobasilar dolichoectasia. No hyperdense vessel. Skull: No fracture or suspicious osseous lesion. Sinuses/Orbits: Old left orbital floor fracture. Minimal bilateral ethmoid air cell and right maxillary sinus mucosal thickening. Clear mastoid air cells. Right ocular prosthesis. Left cataract extraction. Other: None. IMPRESSION: 1. No evidence of acute intracranial abnormality. 2. Moderate cerebral atrophy and mild chronic small vessel ischemic disease. Electronically Signed   By: Logan Bores M.D.   On: 02/15/2018 18:14   Ct Head Wo Contrast  Result Date: 02/14/2018 CLINICAL DATA:  Head trauma EXAM: CT HEAD WITHOUT CONTRAST TECHNIQUE: Contiguous axial images were obtained from the base of the skull through the vertex without  intravenous contrast. COMPARISON:  08/16/2017 FINDINGS: Brain: Diffuse cerebral atrophy. Mild chronic small vessel disease. No acute intracranial abnormality. Specifically, no hemorrhage, hydrocephalus, mass lesion, acute infarction, or significant intracranial injury. Vascular: No hyperdense vessel or unexpected calcification. Skull: No acute calvarial abnormality. Sinuses/Orbits: Visualized paranasal sinuses and mastoids clear. Orbital soft tissues unremarkable. Other: Locules of gas noted within the soft tissues in the left temporal region which appear to correspond to vessels, likely related to IV access. IMPRESSION: No acute intracranial abnormality. Atrophy, chronic microvascular disease. Electronically  Signed   By: Rolm Baptise M.D.   On: 02/14/2018 11:43   Mr Jodene Nam Head Wo Contrast  Result Date: 02/17/2018 CLINICAL DATA:  Altered mental status. Unable to walk. Incontinence. History of cirrhosis and atrial fibrillation. EXAM: MRI HEAD WITHOUT CONTRAST MRA HEAD WITHOUT CONTRAST TECHNIQUE: Multiplanar, multiecho pulse sequences of the brain and surrounding structures were obtained without intravenous contrast. Angiographic images of the head were obtained using MRA technique without contrast. COMPARISON:  Head CT 02/15/2018 and MRI/MRA 04/27/2017 FINDINGS: MRI HEAD FINDINGS The study is mildly to moderately motion degraded. Brain: There is no evidence of acute infarct, intracranial hemorrhage, mass, midline shift, or extra-axial fluid collection. There is moderate cerebral atrophy which is unchanged from the prior MRI. Periventricular white matter T2 hyperintensities are also unchanged and nonspecific but compatible with mild chronic small vessel ischemic disease. Vascular: Major intracranial vascular flow voids are preserved. Skull and upper cervical spine: No suspicious marrow lesion. Sinuses/Orbits: Right ocular prosthesis. Remote left orbital floor fracture. Paranasal sinuses and mastoid air cells are  clear. Other: None. MRA HEAD FINDINGS The study is severely motion degraded which limits assessment for arterial stenoses. The visualized distal vertebral arteries are patent to the basilar with the left being slightly larger than the right. There is apparent moderate narrowing of both distal V4 segments at the vertebrobasilar junction, although motion may be artifactually contributing to this appearance. The basilar artery is patent without definite significant stenosis when accounting for motion artifact. The PCAs are completely obscured by motion. The intracranial internal carotid arteries are patent but are intermittently completely obscured by motion. The ACAs and MCAs are largely obscured by motion artifact, although gross flow is evident in the proximal M1 and A1 segments bilaterally. No large aneurysm is identified. IMPRESSION: 1. Motion degraded examination without evidence of acute intracranial abnormality. 2. Moderate cerebral atrophy and mild chronic small vessel ischemic disease. 3. Severely motion degraded MRA. Grossly patent large vessels at the base of the brain as above. Electronically Signed   By: Logan Bores M.D.   On: 02/17/2018 08:48   Mr Brain Wo Contrast  Result Date: 02/17/2018 CLINICAL DATA:  Altered mental status. Unable to walk. Incontinence. History of cirrhosis and atrial fibrillation. EXAM: MRI HEAD WITHOUT CONTRAST MRA HEAD WITHOUT CONTRAST TECHNIQUE: Multiplanar, multiecho pulse sequences of the brain and surrounding structures were obtained without intravenous contrast. Angiographic images of the head were obtained using MRA technique without contrast. COMPARISON:  Head CT 02/15/2018 and MRI/MRA 04/27/2017 FINDINGS: MRI HEAD FINDINGS The study is mildly to moderately motion degraded. Brain: There is no evidence of acute infarct, intracranial hemorrhage, mass, midline shift, or extra-axial fluid collection. There is moderate cerebral atrophy which is unchanged from the prior MRI.  Periventricular white matter T2 hyperintensities are also unchanged and nonspecific but compatible with mild chronic small vessel ischemic disease. Vascular: Major intracranial vascular flow voids are preserved. Skull and upper cervical spine: No suspicious marrow lesion. Sinuses/Orbits: Right ocular prosthesis. Remote left orbital floor fracture. Paranasal sinuses and mastoid air cells are clear. Other: None. MRA HEAD FINDINGS The study is severely motion degraded which limits assessment for arterial stenoses. The visualized distal vertebral arteries are patent to the basilar with the left being slightly larger than the right. There is apparent moderate narrowing of both distal V4 segments at the vertebrobasilar junction, although motion may be artifactually contributing to this appearance. The basilar artery is patent without definite significant stenosis when accounting for motion artifact. The PCAs are completely obscured by  motion. The intracranial internal carotid arteries are patent but are intermittently completely obscured by motion. The ACAs and MCAs are largely obscured by motion artifact, although gross flow is evident in the proximal M1 and A1 segments bilaterally. No large aneurysm is identified. IMPRESSION: 1. Motion degraded examination without evidence of acute intracranial abnormality. 2. Moderate cerebral atrophy and mild chronic small vessel ischemic disease. 3. Severely motion degraded MRA. Grossly patent large vessels at the base of the brain as above. Electronically Signed   By: Logan Bores M.D.   On: 02/17/2018 08:48   US Renal  Result Date: 02/15/2018 CLINICAL DATA:  Acute on chronic renal failure EXAM: RENAL / URINARY TRACT ULTRASOUND COMPLETE COMPARISON:  Abdominal CT 09/20/2017 FINDINGS: Right Kidney: Length: 10 cm. Smooth cortical thinning. No hydronephrosis or evidence of mass. Left Kidney: Length: 9 cm. Cortical thinning. No hydronephrosis or evidence of mass. Bladder:  Decompressed.  No detected abnormality. IMPRESSION: 1. No hydronephrosis. 2. Symmetric renal atrophy. Electronically Signed   By: Monte Fantasia M.D.   On: 02/15/2018 09:58   Dg Chest Port 1 View  Result Date: 02/16/2018 CLINICAL DATA:  Shortness of breath. EXAM: PORTABLE CHEST 1 VIEW COMPARISON:  Radiograph of February 14, 2018. FINDINGS: Stable cardiomegaly. No pneumothorax is noted. Mild central pulmonary vascular congestion is noted. No significant pleural effusion is noted. No consolidative process is noted. Bony thorax is unremarkable. IMPRESSION: Stable cardiomegaly with central pulmonary vascular congestion. Electronically Signed   By: Marijo Conception, M.D.   On: 02/16/2018 19:14   Dg Abd Acute W/chest  Result Date: 02/14/2018 CLINICAL DATA:  Diarrhea with weakness and increasing immobility over the last 2 weeks. Several falls. EXAM: DG ABDOMEN ACUTE W/ 1V CHEST COMPARISON:  Radiographs 01/10/2018 and 01/05/2018. FINDINGS: Lordotic positioning on the chest radiograph. The heart appears mildly enlarged, but stable. There is atelectasis at both lung bases, but no confluent airspace opacity, pleural effusion or pneumothorax. Abdominal radiographs are somewhat limited by body habitus. There is no evidence of bowel distention, wall thickening or free air. There are no suspicious abdominal calcifications. Degenerative changes are present throughout the lumbar spine. Patient is status post bilateral total hip arthroplasty. IMPRESSION: No apparent acute findings in the chest, abdomen or pelvis. Cardiomegaly with mild bibasilar atelectasis. Electronically Signed   By: Richardean Sale M.D.   On: 02/14/2018 12:33   Dg Hip Unilat With Pelvis 2-3 Views Right  Result Date: 02/14/2018 CLINICAL DATA:  Increasing immobility over the last 2 weeks. Several falls. EXAM: DG HIP (WITH OR WITHOUT PELVIS) 2-3V RIGHT COMPARISON:  Abdominal radiographs 01/05/2018. Pelvic CT 09/20/2017. FINDINGS: Status post bilateral total  hip arthroplasty. There is no evidence of acute fracture or dislocation. The left femoral stem is incompletely visualized. No evidence of hardware loosening. Degenerative changes are present in the lower lumbar spine. There are mild sacroiliac degenerative changes. IMPRESSION: No acute osseous findings status post bilateral total hip arthroplasty. The left femoral stem is incompletely visualized. Electronically Signed   By: Richardean Sale M.D.   On: 02/14/2018 12:35    Orson Eva, DO  [facility] Pager [phone]  If 7PM-7AM, please contact night-coverage [email or web address] Password [email or web address] 02/18/2018, 3:17 PM   LOS: 4 days

## 2018-02-18 NOTE — Progress Notes (Signed)
Pt requesting something to help him sleep. PRN tramadol given. Pt states he still cannot sleep. MD paged.

## 2018-02-18 NOTE — Evaluation (Signed)
Physical Therapy Evaluation Patient Details Name: Justin Parsons MRN: [id number] DOB: 20-Feb-1941 Today's Date: 02/18/2018   History of Present Illness  Justin Parsons is a 77 y.o. male with medical history of  NASH +/- Etoh cirrhosis, esophageal stricture, pyloric stenosis, esophagogastric junction outflow obstruction, chronic atrial fibrillation, CKD stage III, coronary artery disease presenting with 3-day history of diarrhea.  The patient states he has been having 2-3 watery bowel movements daily.  He was recently discharged from the hospital after stay from 01/05/18 through 01/15/18 during which he was treated for his intractable vomiting.  Esophageal manometry on 01/10/2018 showed esophago-gastric junction outflow obstruction.  The patient was referred to GI at [facility], Dr. Roney Mans.  During the hospitalization, the patient was also treated for aspiration pneumonia.  He was discharged with 7 days of Augmentin.  The patient subsequently followed up with Dr. Roney Mans on 01/20/18.  He recommended a timed barium esophagram to differentiate between gastroesophageal reflux disease and early achalasia; however, it does not appear that the patient followed up with the study.  Nevertheless, the patient continues to tolerate his dysphagia 2 diet without any vomiting.  He denies any fevers, chills, abdominal pain, hematochezia, melena, hematemesis.  He denies any chest pain, shortness breath, hemoptysis, fevers, chills, headache.  He complains of generalized weakness to the point where he is having difficulty getting out of bed and feeling unsteady on his feet.  As result, the patient presented for further evaluation in the emergency department.  He denies any other new medications.    Clinical Impression  Patient demonstrates slow labored movement for sitting up at bedside and taking steps with RW, limited for standing up to 1-2 minutes before having to sit due to c/o fatigue and weakness.  Patient put back to bed  and nursing staff notified patient need to be cleaned due to flexi seal tube leaking  Patient will benefit from continued physical therapy in hospital and recommended venue below to increase strength, balance, endurance for safe ADLs and gait.    Follow Up Recommendations SNF    Equipment Recommendations  None recommended by PT    Recommendations for Other Services       Precautions / Restrictions Precautions Precautions: Fall Restrictions Weight Bearing Restrictions: No      Mobility  Bed Mobility Overal bed mobility: Needs Assistance Bed Mobility: Supine to Sit;Sit to Supine     Supine to sit: Min assist;Mod assist Sit to supine: Min assist;Mod assist   General bed mobility comments: has to use siderail to help pull self up  Transfers Overall transfer level: Needs assistance Equipment used: Rolling walker (2 wheeled) Transfers: Sit to/from Stand Sit to Stand: Mod assist            Ambulation/Gait Ambulation/Gait assistance: Max assist Ambulation Distance (Feet): 3 Feet Assistive device: Rolling walker (2 wheeled) Gait Pattern/deviations: Decreased step length - right;Decreased step length - left;Decreased stride length Gait velocity: decreased Gait velocity interpretation: Below normal speed for age/gender General Gait Details: limited to 3-4 side steps at bedside due to c/o weakness and HR going up to near 200 bpm  Stairs            Wheelchair Mobility    Modified Rankin (Stroke Patients Only)       Balance Overall balance assessment: Needs assistance Sitting-balance support: Feet supported;No upper extremity supported Sitting balance-Leahy Scale: Fair     Standing balance support: Bilateral upper extremity supported;During functional activity Standing balance-Leahy Scale: Poor Standing balance comment:  fair/poor with RW                             Pertinent Vitals/Pain Pain Assessment: No/denies pain    Home Living  Family/patient expects to be discharged to:: Private residence Living Arrangements: Non-relatives/Friends Available Help at Discharge: Friend(s) Type of Home: House Home Access: Ramped entrance     Home Layout: Two level Home Equipment: [place] - single point;Walker - 2 wheels;Wheelchair - [employer];Shower seat - built in;Walker - standard      Prior Function Level of Independence: Needs assistance   Gait / Transfers Assistance Needed: Ambulates household distances with standard walker  ADL's / Homemaking Assistance Needed: friends assist        Hand Dominance        Extremity/Trunk Assessment   Upper Extremity Assessment Upper Extremity Assessment: Generalized weakness    Lower Extremity Assessment Lower Extremity Assessment: Generalized weakness    Cervical / Trunk Assessment Cervical / Trunk Assessment: Normal  Communication   Communication: No difficulties  Cognition Arousal/Alertness: Awake/alert Behavior During Therapy: WFL for tasks assessed/performed Overall Cognitive Status: Within Functional Limits for tasks assessed                                        General Comments      Exercises     Assessment/Plan    PT Assessment Patient needs continued PT services  PT Problem List Decreased strength;Decreased activity tolerance;Decreased balance;Decreased mobility       PT Treatment Interventions Gait training;Functional mobility training;Therapeutic activities;Therapeutic exercise;Patient/family education    PT Goals (Current goals can be found in the Care Plan section)  Acute Rehab PT Goals Patient Stated Goal: return home with friends to assist PT Goal Formulation: With patient Time For Goal Achievement: 02/25/18 Potential to Achieve Goals: Good    Frequency Min 3X/week   Barriers to discharge        Co-evaluation               AM-PAC PT "6 Clicks" Daily Activity  Outcome Measure Difficulty turning over  in bed (including adjusting bedclothes, sheets and blankets)?: A Little Difficulty moving from lying on back to sitting on the side of the bed? : A Lot Difficulty sitting down on and standing up from a chair with arms (e.g., wheelchair, bedside commode, etc,.)?: A Lot Help needed moving to and from a bed to chair (including a wheelchair)?: A Lot Help needed walking in hospital room?: A Lot Help needed climbing 3-5 steps with a railing? : Total 6 Click Score: 12    End of Session   Activity Tolerance: Patient limited by fatigue Patient left: in bed;with call bell/phone within reach;with bed alarm set Nurse Communication: Mobility status PT Visit Diagnosis: Unsteadiness on feet (R26.81);Other abnormalities of gait and mobility (R26.89);Muscle weakness (generalized) (M62.81)    Time: 8295-6213 PT Time Calculation (min) (ACUTE ONLY): 25 min   Charges:   PT Evaluation $PT Eval Moderate Complexity: 1 Mod PT Treatments $Therapeutic Activity: 23-37 mins   PT G Codes:        4:07 PM, 26-Feb-2018 Lonell Grandchild, MPT Physical Therapist with [facility] [phone] office [phone] mobile phone

## 2018-02-18 NOTE — Care Management Note (Addendum)
Case Management Note  Patient Details  Name: Justin Parsons MRN: [id number] Date of Birth: [birth year]  Subjective/Objective:   Adm with hypotension. From home with a friend "Grayland Ormond". Joe, another friend lives close by and assists patient as needed. Has oxygen pta provided by [facility]. Is active with [facility].  Patient prefers to go home with home health but is open to SNF if needed. Reports he has been to [facility] in the past.                Action/Plan: CM following for needs. PT to evaluate patient.   Expected Discharge Date:   02/20/2018               Expected Discharge Plan:     In-House Referral:     Discharge planning Services  CM Consult  Post Acute Care Choice:    Choice offered to:     DME Arranged:    DME Agency:     HH Arranged:    HH Agency:     Status of Service:  In process, will continue to follow  If discussed at Long Length of Stay Meetings, dates discussed:    Additional Comments:  Shawna Kiener, Chauncey Reading, RN 02/18/2018, 1:53 PM

## 2018-02-18 NOTE — Progress Notes (Signed)
Pt has had multiple large BMs. Because of thick consistency it is leaking around flexi seal in a large amount. Peri care and foley care performed multiple times and barrier cream applied.  Pt has not slept all night and seems to have a little sundowners.   Margaret Pyle, RN

## 2018-02-18 NOTE — Plan of Care (Signed)
  Problem: Acute Rehab PT Goals(only PT should resolve) Goal: Pt Will Go Supine/Side To Sit Outcome: Progressing Flowsheets (Taken 02/18/2018 1610) Pt will go Supine/Side to Sit: with minimal assist Goal: Patient Will Transfer Sit To/From Stand Outcome: Progressing Flowsheets (Taken 02/18/2018 1610) Patient will transfer sit to/from stand: with minimal assist Goal: Pt Will Transfer Bed To Chair/Chair To Bed Outcome: Progressing Flowsheets (Taken 02/18/2018 1610) Pt will Transfer Bed to Chair/Chair to Bed: with min assist Goal: Pt Will Ambulate Outcome: Progressing Flowsheets (Taken 02/18/2018 1610) Pt will Ambulate: 25 feet;with moderate assist;with rolling walker;with standard walker  4:10 PM, 02/18/18 Lonell Grandchild, MPT Physical Therapist with [facility] [phone] office [phone] mobile phone

## 2018-02-19 ENCOUNTER — Encounter: Payer: Self-pay | Admitting: Internal Medicine

## 2018-02-19 DIAGNOSIS — N183 Chronic kidney disease, stage 3 (moderate): Secondary | ICD-10-CM

## 2018-02-19 DIAGNOSIS — K7030 Alcoholic cirrhosis of liver without ascites: Secondary | ICD-10-CM

## 2018-02-19 DIAGNOSIS — I4891 Unspecified atrial fibrillation: Secondary | ICD-10-CM

## 2018-02-19 DIAGNOSIS — I482 Chronic atrial fibrillation: Secondary | ICD-10-CM

## 2018-02-19 DIAGNOSIS — N179 Acute kidney failure, unspecified: Secondary | ICD-10-CM

## 2018-02-19 LAB — CULTURE, BLOOD (ROUTINE X 2)
CULTURE: NO GROWTH
Culture: NO GROWTH
Special Requests: ADEQUATE
Special Requests: ADEQUATE

## 2018-02-19 MED ORDER — DILTIAZEM HCL 30 MG PO TABS
30.0000 mg | ORAL_TABLET | Freq: Four times a day (QID) | ORAL | Status: DC
  Administered 2018-02-19 – 2018-02-20 (×4): 30 mg via ORAL
  Filled 2018-02-19 (×4): qty 1

## 2018-02-19 NOTE — NC FL2 (Signed)
[place] LEVEL OF CARE SCREENING TOOL     IDENTIFICATION  Patient Name: Justin Parsons Birthdate: 1940-12-20 Sex: male Admission Date (Current Location): 02/14/2018  [facility] and [place] Number:  [employer] and Address:  [facility] [address] [place]      Provider Number: [id number]  Attending Physician Name and Address:  Isaac Bliss, Olam Idler*  Relative Name and Phone Number:  Lazlo Tunney [phone] (Cell)  [phone] (home)    Current Level of Care: Hospital Recommended Level of Care: [place] Prior Approval Number: [id number]  Date Approved/Denied: 05/01/17 PASRR Number:    Discharge Plan: SNF    Current Diagnoses: Patient Active Problem List   Diagnosis Date Noted  . Generalized weakness   . Diarrhea 02/14/2018  . Acute gouty arthritis 01/15/2018  . Pressure injury of skin 01/12/2018  . Gastritis due to nonsteroidal anti-inflammatory drug   . 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 Intractable vomiting 09/20/2017  . Atrial fibrillation with RVR ([place]) 09/20/2017  . Atrial fibrillation, chronic ([place]) 08/18/2017  . Gastric outlet obstruction   . AKI (acute kidney injury) ([place])   . Nausea with vomiting 08/15/2017  . Hypotension 08/15/2017  . Dehydration 08/15/2017  . Intractable vomiting with nausea 08/15/2017  . Transaminasemia 08/15/2017  . AF (paroxysmal atrial fibrillation) ([place]) 08/15/2017  . Alcoholic cirrhosis of liver without ascites ([place]) 08/15/2017  . Abnormal liver enzymes   . Coronary artery disease involving native coronary artery of native heart with angina pectoris ([place])   . Tobacco abuse   . Acute renal failure superimposed on stage 3 chronic kidney disease ([place])   . Alcohol use disorder   . Non-ST elevation (NSTEMI) myocardial infarction ([place]) 04/29/2017  . Delirium tremens ([place])   . Shock circulatory ([place])   . Confusion   . Acute encephalopathy  04/27/2017  . Schatzki's ring   . Esophageal varices ([place])   . History of esophageal stricture 01/20/2015  . Esophageal stricture   . History of colonic polyps   . Dysphagia, pharyngoesophageal phase 11/04/2014  . Suicidal ideation 11/17/2013  . Acute renal failure ([place]) 11/17/2013  . Acute diastolic CHF (congestive heart failure) ([place]) 11/13/2013  . Acute respiratory failure ([place]) 11/13/2013  . Community acquired pneumonia 11/12/2013  . A-fib ([place]) 11/12/2013  . CKD (chronic kidney disease), stage III ([place]) 11/12/2013  . Hypothyroidism 11/12/2013  . Dyspnea 11/12/2013  . Thrombocytopenia ([place]) 06/05/2012  . ARF (acute renal failure) ([place]) 06/04/2012  . Obesity 06/04/2012  . Cirrhosis ([place]) 01/29/2012  . Adenomatous polyp 01/29/2012  . Renal insufficiency 05/01/2011  . Anemia 05/01/2011  . Rectal bleed 05/01/2011  . GERD (gastroesophageal reflux disease) 05/01/2011    Orientation RESPIRATION BLADDER Height & Weight     Self, Time, Situation, Place  Normal Incontinent Weight: 241 lb 10 oz (109.6 kg) Height:  5' 8"  (172.7 cm)  BEHAVIORAL SYMPTOMS/MOOD NEUROLOGICAL BOWEL NUTRITION STATUS      Continent Diet(dysphagia 2)  AMBULATORY STATUS COMMUNICATION OF NEEDS Skin   Extensive Assist Verbally Normal                       Personal Care Assistance Level of Assistance  Bathing, Feeding, Dressing Bathing Assistance: Limited assistance Feeding assistance: Independent Dressing Assistance: Limited assistance     Functional Limitations Info  Sight, Hearing, Speech Sight Info: Adequate Hearing Info: Adequate Speech Info: Adequate    SPECIAL CARE FACTORS FREQUENCY  PT (By  licensed PT)     PT Frequency: 5 times week              Contractures Contractures Info: Not present    Additional Factors Info  Code Status, Allergies Code Status Info: DNR Allergies Info: No Known Allergies           Current Medications (02/19/2018):  This is the current hospital  active medication list Current Facility-Administered Medications  Medication Dose Route Frequency Provider Last Rate Last Dose  . 0.9 % NaCl with KCl 20 mEq/ L  infusion   Intravenous Continuous Tat, David, MD 100 mL/hr at 02/19/18 0859    . acetaminophen (TYLENOL) tablet 650 mg  650 mg Oral Q6H PRN Tat, David, MD       Or  . acetaminophen (TYLENOL) suppository 650 mg  650 mg Rectal Q6H PRN Tat, Shanon Brow, MD      . allopurinol (ZYLOPRIM) tablet 100 mg  100 mg Oral Daily Tat, David, MD   100 mg at 02/19/18 0856  . aspirin chewable tablet 81 mg  81 mg Oral Daily Tat, Shanon Brow, MD   81 mg at 02/19/18 0856  . atorvastatin (LIPITOR) tablet 80 mg  80 mg Oral q1800 Orson Eva, MD   80 mg at 02/18/18 1604  . calcium carbonate (TUMS - dosed in mg elemental calcium) chewable tablet 200 mg of elemental calcium  1 tablet Oral BID Tat, David, MD   200 mg of elemental calcium at 02/19/18 0855  . carvedilol (COREG) tablet 3.125 mg  3.125 mg Oral BID WC Orson Eva, MD   3.125 mg at 02/19/18 0854  . clopidogrel (PLAVIX) tablet 75 mg  75 mg Oral Daily Tat, Shanon Brow, MD   75 mg at 02/19/18 0855  . diltiazem (CARDIZEM) 100 mg in dextrose 5% 159m (1 mg/mL) infusion  5-15 mg/hr Intravenous Titrated TOrson Eva MD 5 mL/hr at 02/19/18 0858 5 mg/hr at 02/19/18 0858  . diltiazem (CARDIZEM) tablet 30 mg  30 mg Oral Q6H HIsaac Bliss ERayford Halsted MD      . feeding supplement (BOOST / RESOURCE BREEZE) liquid 1 Container  1 Container Oral QID TOrson Eva MD   1 Container at 02/19/18 0320 [id number] . folic acid (FOLVITE) tablet 1 mg  1 mg Oral Daily Tat, David, MD   1 mg at 02/19/18 0855  . lactulose (CHRONULAC) 10 GM/15ML solution 20 g  20 g Oral BID TOrson Eva MD   20 g at 02/19/18 0854  . levothyroxine (SYNTHROID, LEVOTHROID) tablet 50 mcg  50 mcg Oral QAC breakfast Tat, DShanon Brow MD   50 mcg at 02/19/18 [phone] . metoCLOPramide (REGLAN) tablet 5 mg  5 mg Oral BID AMina Marble MD   5 mg at 02/19/18 0854  . ondansetron (ZOFRAN) tablet 4 mg  4  mg Oral Q6H PRN Tat, David, MD       Or  . ondansetron (ZOFRAN) injection 4 mg  4 mg Intravenous Q6H PRN Tat, David, MD      . pantoprazole (PROTONIX) EC tablet 40 mg  40 mg Oral Daily Tat, David, MD   40 mg at 02/19/18 0856  . predniSONE (DELTASONE) tablet 40 mg  40 mg Oral BID WC TOrson Eva MD   40 mg at 02/19/18 0854  . traMADol (ULTRAM) tablet 50 mg  50 mg Oral Q6H PRN TOrson Eva MD   50 mg at 02/18/18 2110  . zolpidem (AMBIEN) tablet 5 mg  5 mg Oral QHS PRN Schorr, KBelenda Cruise  P, NP   5 mg at 02/18/18 2110     Discharge Medications: Please see discharge summary for a list of discharge medications.  Relevant Imaging Results:  Relevant Lab Results:   Additional Information SSN: [id number] [place]

## 2018-02-19 NOTE — Care Management Important Message (Signed)
Important Message  Patient Details  Name: TEQUAN REDMON MRN: [id number] Date of Birth: Oct 03, 1941   Medicare Important Message Given:  Yes    Shelda Altes 02/19/2018, 11:35 AM

## 2018-02-19 NOTE — Progress Notes (Signed)
PROGRESS NOTE    Justin Parsons  TWS:[id number] DOB: 04-13-1941 DOA: 02/14/2018 PCP: Monico Blitz, MD     Brief Narrative:  77 year old man admitted from home on 3/29 due to acute on chronic renal failure with a creatinine peaking at 4.83.  Patient also developed acute metabolic encephalopathy which has since resolved.  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  On 4/2 developed A. fib with RVR, Coreg was resumed and diltiazem drip was started.   Assessment & Plan:   Active Problems:   GERD (gastroesophageal reflux disease)   Thrombocytopenia (HCC)   Acute renal failure superimposed on stage 3 chronic kidney disease (HCC)   Hypotension   Alcoholic cirrhosis of liver without ascites (HCC)   Atrial fibrillation, chronic (HCC)   Diarrhea   Generalized weakness   A. fib with RVR -Rates have improved overnight on Cardizem drip, currently in the 90s to low 100s. -Will start oral Cardizem with goal to wean off Cardizem drip. -Continue current dose of Coreg which is 3.125 twice daily.  Acute metabolic encephalopathy -Completely resolved. -2 CTs and one MRI of the brain  were negative for acute findings. -Patient had B12, TSH, RPR, ammonia were also within normal limits. -Patient had an ABG that did not show hypoxia or hypercarbia. -Patient was empirically treated for hepatic encephalopathy with lactulose. -As patient's encephalopathy improved with lactulose and he does have a history of cirrhosis, decision has been made to continue empiric lactulose at this time. -Baclofen has been discontinued. -EEG was performed that did not show any epileptiform discharges. -There has been no evidence for acute infections including UTI or pneumonia.  Acute on chronic kidney disease stage III -Baseline creatinine appears to be around  1.5-1.8. -Creatinine on admission was 4.83, is currently down to 1.81 which is  his baseline  Diarrhea -Resolved, presumed secondary to lactulose as stool pathogen panel and C. difficile have been negative.  Coronary artery disease -Stable, no chest pain.  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 -Discontinue subcutaneous heparin, continue SCDs   DVT prophylaxis: SCD  Code Status: DNR Family Communication: Patient only, no family members at bedside Disposition Plan: Keep in ICU until we are able to wean off Cardizem drip.    Consultants:   None   Procedures:   None  Antimicrobials:  Anti-infectives (From admission, onward)   None       Subjective: Feels well, his main concern is whether his insurance will pay for his posthospitalization rehab, denies abdominal pain, emesis or diarrhea.  Objective: Vitals:   02/19/18 0500 02/19/18 0530 02/19/18 0600 02/19/18 0736  BP: (!) 123/96 129/87 127/85   Pulse: 67 86 84 96  Resp: (!) 23 (!) 27 (!) 25 (!) 23  Temp:    (!) 97.3 F (36.3 C)  TempSrc:    Oral  SpO2: 97% 97% 96% 95%  Weight: 109.6 kg (241 lb 10 oz)     Height:        Intake/Output Summary (Last 24 hours) at 02/19/2018 0903 Last data filed at 02/19/2018 0500 Gross per 24 hour  Intake 2653.75 ml  Output 1500 ml  Net 1153.75 ml   Filed Weights   02/17/18 0500 02/18/18 0500 02/19/18 0500  Weight: 102.9 kg (226 lb 13.7 oz) 108.8 kg (239 lb 13.8 oz) 109.6 kg (241 lb 10  oz)    Examination:  General exam: Alert, awake, oriented x 3 Respiratory system: Clear to auscultation. Respiratory effort normal. Cardiovascular system:RRR. No murmurs, rubs, gallops. Gastrointestinal system: Abdomen is nondistended, soft and nontender. No organomegaly or masses felt. Normal bowel sounds heard. Central nervous system: Alert and oriented. No focal neurological  deficits. Extremities: No C/C/E, +pedal pulses Skin: No rashes, lesions or ulcers Psychiatry: Judgement and insight appear normal. Mood & affect appropriate.     Data Reviewed: I have personally reviewed following labs and imaging studies  CBC: Recent Labs  Lab 02/14/18 1122 02/15/18 0515 02/16/18 0339 02/17/18 0442 02/18/18 0530  WBC 10.7* 13.4* 10.9* 12.6* 16.6*  NEUTROABS 7.4  --   --   --   --   HGB 11.1* 12.3* 10.8* 11.7* 10.7*  HCT 33.6* 37.2* 33.2* 37.5* 33.6*  MCV 91.6 91.0 92.0 93.3 92.8  PLT 137* 194 153 189 117*   Basic Metabolic Panel: Recent Labs  Lab 02/14/18 1122 02/15/18 0515 02/16/18 0339 02/17/18 0442 02/18/18 0530  NA 132* 137 138 144 143  K 3.6 3.6 3.9 3.9 4.1  CL 90* 96* 105 112* 115*  CO2 26 25 24 22  20*  GLUCOSE 79 74 154* 114* 133*  BUN 62* 61* 58* 47* 42*  CREATININE 4.83* 3.79* 3.04* 2.24* 1.81*  CALCIUM 8.1* 8.4* 8.7* 9.4 9.1  MG  --  1.5*  --   --   --    GFR: Estimated Creatinine Clearance: 40.4 mL/min (A) (by C-G formula based on SCr of 1.81 mg/dL (H)). Liver Function Tests: Recent Labs  Lab 02/14/18 1122 02/15/18 0515  AST 19 20  ALT 13* 13*  ALKPHOS 88 98  BILITOT 0.9 1.0  PROT 5.4* 5.9*  ALBUMIN 2.5* 2.6*   Recent Labs  Lab 02/14/18 1122  LIPASE 22   Recent Labs  Lab 02/16/18 0339  AMMONIA 10   Coagulation Profile: Recent Labs  Lab 02/14/18 1122  INR 1.09   Cardiac Enzymes: Recent Labs  Lab 02/14/18 1122 02/15/18 1538 02/15/18 2018 02/16/18 0339  TROPONINI <0.03 <0.03 <0.03 <0.03   BNP (last 3 results) No results for input(s): PROBNP in the last 8760 hours. HbA1C: No results for input(s): HGBA1C in the last 72 hours. CBG: Recent Labs  Lab 02/16/18 0759  GLUCAP 140*   Lipid Profile: No results for input(s): CHOL, HDL, LDLCALC, TRIG, CHOLHDL, LDLDIRECT in the last 72 hours. Thyroid Function Tests: Recent Labs    02/17/18 0442  TSH 2.412   Anemia Panel: No results for input(s):  VITAMINB12, FOLATE, FERRITIN, TIBC, IRON, RETICCTPCT in the last 72 hours. Urine analysis:    Component Value Date/Time   COLORURINE STRAW (A) 02/14/2018 1106   APPEARANCEUR CLEAR 02/14/2018 1106   LABSPEC 1.004 (L) 02/14/2018 1106   PHURINE 5.0 02/14/2018 1106   GLUCOSEU NEGATIVE 02/14/2018 1106   HGBUR NEGATIVE 02/14/2018 1106   BILIRUBINUR NEGATIVE 02/14/2018 1106   KETONESUR NEGATIVE 02/14/2018 1106   PROTEINUR NEGATIVE 02/14/2018 1106   UROBILINOGEN 0.2 11/12/2013 2315   NITRITE NEGATIVE 02/14/2018 1106   LEUKOCYTESUR NEGATIVE 02/14/2018 1106   Sepsis Labs: @LABRCNTIP (procalcitonin:4,lacticidven:4)  ) Recent Results (from the past 240 hour(s))  Urine culture     Status: Abnormal   Collection Time: 02/14/18 11:08 AM  Result Value Ref Range Status   Specimen Description   Final    URINE, CLEAN CATCH Performed at [facility], [address], [place], [place] [address]    Special Requests   Final  NONE Performed at [facility], [address] [place], [place] [address]    Culture (A)  Final    <10,000 COLONIES/mL Performed at [facility] [address] [place], [place] [address]    Report Status 02/15/2018 FINAL  Final  MRSA PCR Screening     Status: None   Collection Time: 02/14/18  6:25 PM  Result Value Ref Range Status   MRSA by PCR NEGATIVE NEGATIVE Final    Comment:        The GeneXpert MRSA Assay (FDA approved for NASAL specimens only), is one component of a comprehensive MRSA colonization surveillance program. It is not intended to diagnose MRSA infection nor to guide or monitor treatment for MRSA infections. Performed at [facility], [address], [place], [place] [address]   Culture, blood (Routine X 2) w Reflex to ID Panel     Status: None   Collection Time: 02/14/18  6:55 PM  Result Value Ref Range Status   Specimen Description RIGHT ANTECUBITAL  Final   Special Requests   Final    BOTTLES DRAWN AEROBIC AND ANAEROBIC Blood Culture  adequate volume   Culture   Final    NO GROWTH 5 DAYS Performed at [facility], [address] [place], [place] [address]    Report Status 02/19/2018 FINAL  Final  Culture, blood (Routine X 2) w Reflex to ID Panel     Status: None   Collection Time: 02/14/18  7:05 PM  Result Value Ref Range Status   Specimen Description BLOOD LEFT HAND  Final   Special Requests   Final    BOTTLES DRAWN AEROBIC ONLY Blood Culture adequate volume   Culture   Final    NO GROWTH 5 DAYS Performed at [facility], [address] [place], [place] [address]    Report Status 02/19/2018 FINAL  Final  Gastrointestinal Panel by PCR , Stool     Status: None   Collection Time: 02/15/18  3:49 AM  Result Value Ref Range Status   Campylobacter species NOT DETECTED NOT DETECTED Final   Plesimonas shigelloides NOT DETECTED NOT DETECTED Final   Salmonella species NOT DETECTED NOT DETECTED Final   Yersinia enterocolitica NOT DETECTED NOT DETECTED Final   Vibrio species NOT DETECTED NOT DETECTED Final   Vibrio cholerae NOT DETECTED NOT DETECTED Final   Enteroaggregative E coli (EAEC) NOT DETECTED NOT DETECTED Final   Enteropathogenic E coli (EPEC) NOT DETECTED NOT DETECTED Final   Enterotoxigenic E coli (ETEC) NOT DETECTED NOT DETECTED Final   Shiga like toxin producing E coli (STEC) NOT DETECTED NOT DETECTED Final   Shigella/Enteroinvasive E coli (EIEC) NOT DETECTED NOT DETECTED Final   Cryptosporidium NOT DETECTED NOT DETECTED Final   Cyclospora cayetanensis NOT DETECTED NOT DETECTED Final   Entamoeba histolytica NOT DETECTED NOT DETECTED Final   Giardia lamblia NOT DETECTED NOT DETECTED Final   Adenovirus F40/41 NOT DETECTED NOT DETECTED Final   Astrovirus NOT DETECTED NOT DETECTED Final   Norovirus GI/GII NOT DETECTED NOT DETECTED Final   Rotavirus A NOT DETECTED NOT DETECTED Final   Sapovirus (I, II, IV, and V) NOT DETECTED NOT DETECTED Final    Comment: Performed at [facility], [address] [place],  [address]  C difficile quick scan w PCR reflex     Status: None   Collection Time: 02/15/18  3:20 PM  Result Value Ref Range Status   C Diff antigen NEGATIVE NEGATIVE Final   C Diff  toxin NEGATIVE NEGATIVE Final   C Diff interpretation No C. difficile detected.  Final    Comment: Performed at [facility], [address], [place], [place] [address]         Radiology Studies: No results found.      Scheduled Meds: . allopurinol  100 mg Oral Daily  . aspirin  81 mg Oral Daily  . atorvastatin  80 mg Oral q1800  . calcium carbonate  1 tablet Oral BID  . carvedilol  3.125 mg Oral BID WC  . clopidogrel  75 mg Oral Daily  . feeding supplement  1 Container Oral QID  . folic acid  1 mg Oral Daily  . heparin  5,000 Units Subcutaneous Q8H  . lactulose  20 g Oral BID  . levothyroxine  50 mcg Oral QAC breakfast  . metoCLOPramide  5 mg Oral BID AC  . pantoprazole  40 mg Oral Daily  . predniSONE  40 mg Oral BID WC   Continuous Infusions: . 0.9 % NaCl with KCl 20 mEq / L 100 mL/hr at 02/19/18 0859  . diltiazem (CARDIZEM) infusion 5 mg/hr (02/19/18 0858)     LOS: 5 days    Time spent: 35 minutes. Greater than 50% of this time was spent in direct contact with the patient and coordinating care.     Lelon Frohlich, MD [facility] Pager [phone]  If 7PM-7AM, please contact night-coverage [email or web address] Password [email or web address] 02/19/2018, 9:03 AM

## 2018-02-19 NOTE — Clinical Social Work Note (Signed)
Clinical Social Work Assessment  Patient Details  Name: Justin Parsons MRN: [id number] Date of Birth: 09-30-1941  Date of referral:  02/19/18               Reason for consult:  Facility Placement, Discharge Planning                Permission sought to share information with:  Facility Art therapist granted to share information::     Name::        Agency::  [facility]  Relationship::     Contact Information:     Housing/Transportation Living arrangements for the past 2 months:  [place] of Information:  Patient Patient Interpreter Needed:  None Criminal Activity/Legal Involvement Pertinent to Current Situation/Hospitalization:  No - Comment as needed Significant Relationships:    Lives with:  Self Do you feel safe going back to the place where you live?  Yes Need for family participation in patient care:  Yes (Comment)  Care giving concerns: PT recommending STR at SNF.   Social Worker assessment / plan: Pt is a 77 year old male referred to CSW for SNF rehab placement. Met with pt today to assist. Per pt, he lives alone and feels he would benefit from SNF rehab. Pt has been to [facility] in the past and he would like to return. Will initiate referral and continue to assist with dc planning.  Employment status:  Retired Forensic scientist:  Medicare PT Recommendations:  [place] / Referral to community resources:     Patient/Family's Response to care: Pt accepting of care.  Patient/Family's Understanding of and Emotional Response to Diagnosis, Current Treatment, and Prognosis: Pt appears to understand diagnosis and treatment recommendations. Pt states that one doctor told him his heart was bad and one doctor told him his heart was good so he is confused about which is correct. Encouraged pt to request clarification from the doctor at the next visit and pt stated he is planning to do this. No  emotional distress identified.  Emotional Assessment Appearance:  Appears stated age Attitude/Demeanor/Rapport:  Engaged Affect (typically observed):  Accepting, Calm, Pleasant Orientation:  Oriented to Self, Oriented to Place, Oriented to  Time, Oriented to Situation Alcohol / Substance use:  Not Applicable Psych involvement (Current and /or in the community):  No (Comment)  Discharge Needs  Concerns to be addressed:  Discharge Planning Concerns Readmission within the last 30 days:  No Current discharge risk:  Physical Impairment Barriers to Discharge:  No Barriers Identified   Shade Flood, LCSW 02/19/2018, 11:19 AM

## 2018-02-20 DIAGNOSIS — E785 Hyperlipidemia, unspecified: Secondary | ICD-10-CM | POA: Diagnosis not present

## 2018-02-20 DIAGNOSIS — K110 Atrophy of salivary gland: Secondary | ICD-10-CM | POA: Diagnosis not present

## 2018-02-20 DIAGNOSIS — D696 Thrombocytopenia, unspecified: Secondary | ICD-10-CM | POA: Diagnosis not present

## 2018-02-20 DIAGNOSIS — I480 Paroxysmal atrial fibrillation: Secondary | ICD-10-CM | POA: Diagnosis not present

## 2018-02-20 DIAGNOSIS — E039 Hypothyroidism, unspecified: Secondary | ICD-10-CM | POA: Diagnosis not present

## 2018-02-20 DIAGNOSIS — Q400 Congenital hypertrophic pyloric stenosis: Secondary | ICD-10-CM | POA: Diagnosis not present

## 2018-02-20 DIAGNOSIS — R1312 Dysphagia, oropharyngeal phase: Secondary | ICD-10-CM | POA: Diagnosis not present

## 2018-02-20 DIAGNOSIS — K222 Esophageal obstruction: Secondary | ICD-10-CM | POA: Diagnosis not present

## 2018-02-20 DIAGNOSIS — D649 Anemia, unspecified: Secondary | ICD-10-CM | POA: Diagnosis not present

## 2018-02-20 DIAGNOSIS — I2510 Atherosclerotic heart disease of native coronary artery without angina pectoris: Secondary | ICD-10-CM | POA: Diagnosis not present

## 2018-02-20 DIAGNOSIS — H54413A Blindness right eye category 3, normal vision left eye: Secondary | ICD-10-CM | POA: Diagnosis not present

## 2018-02-20 DIAGNOSIS — M6281 Muscle weakness (generalized): Secondary | ICD-10-CM | POA: Diagnosis not present

## 2018-02-20 DIAGNOSIS — I5032 Chronic diastolic (congestive) heart failure: Secondary | ICD-10-CM | POA: Diagnosis not present

## 2018-02-20 DIAGNOSIS — R29898 Other symptoms and signs involving the musculoskeletal system: Secondary | ICD-10-CM | POA: Diagnosis not present

## 2018-02-20 DIAGNOSIS — R262 Difficulty in walking, not elsewhere classified: Secondary | ICD-10-CM | POA: Diagnosis not present

## 2018-02-20 DIAGNOSIS — N183 Chronic kidney disease, stage 3 (moderate): Secondary | ICD-10-CM | POA: Diagnosis not present

## 2018-02-20 DIAGNOSIS — I4891 Unspecified atrial fibrillation: Secondary | ICD-10-CM | POA: Diagnosis not present

## 2018-02-20 DIAGNOSIS — R197 Diarrhea, unspecified: Secondary | ICD-10-CM | POA: Diagnosis not present

## 2018-02-20 DIAGNOSIS — R112 Nausea with vomiting, unspecified: Secondary | ICD-10-CM | POA: Diagnosis not present

## 2018-02-20 DIAGNOSIS — N179 Acute kidney failure, unspecified: Secondary | ICD-10-CM | POA: Diagnosis not present

## 2018-02-20 DIAGNOSIS — K7030 Alcoholic cirrhosis of liver without ascites: Secondary | ICD-10-CM | POA: Diagnosis not present

## 2018-02-20 DIAGNOSIS — K219 Gastro-esophageal reflux disease without esophagitis: Secondary | ICD-10-CM | POA: Diagnosis not present

## 2018-02-20 DIAGNOSIS — K3184 Gastroparesis: Secondary | ICD-10-CM | POA: Diagnosis not present

## 2018-02-20 DIAGNOSIS — R630 Anorexia: Secondary | ICD-10-CM | POA: Diagnosis not present

## 2018-02-20 DIAGNOSIS — R2681 Unsteadiness on feet: Secondary | ICD-10-CM | POA: Diagnosis not present

## 2018-02-20 DIAGNOSIS — G9341 Metabolic encephalopathy: Secondary | ICD-10-CM | POA: Diagnosis not present

## 2018-02-20 LAB — CBC
HEMATOCRIT: 33 % — AB (ref 39.0–52.0)
Hemoglobin: 10.5 g/dL — ABNORMAL LOW (ref 13.0–17.0)
MCH: 29.8 pg (ref 26.0–34.0)
MCHC: 31.8 g/dL (ref 30.0–36.0)
MCV: 93.8 fL (ref 78.0–100.0)
PLATELETS: 141 10*3/uL — AB (ref 150–400)
RBC: 3.52 MIL/uL — AB (ref 4.22–5.81)
RDW: 19.1 % — ABNORMAL HIGH (ref 11.5–15.5)
WBC: 16.6 10*3/uL — AB (ref 4.0–10.5)

## 2018-02-20 LAB — BASIC METABOLIC PANEL
Anion gap: 6 (ref 5–15)
BUN: 34 mg/dL — ABNORMAL HIGH (ref 6–20)
CHLORIDE: 111 mmol/L (ref 101–111)
CO2: 23 mmol/L (ref 22–32)
Calcium: 9.4 mg/dL (ref 8.9–10.3)
Creatinine, Ser: 1.34 mg/dL — ABNORMAL HIGH (ref 0.61–1.24)
GFR calc non Af Amer: 49 mL/min — ABNORMAL LOW (ref >60)
GFR, EST AFRICAN AMERICAN: 57 mL/min — AB (ref >60)
Glucose, Bld: 131 mg/dL — ABNORMAL HIGH (ref 65–99)
POTASSIUM: 5.2 mmol/L — AB (ref 3.5–5.1)
SODIUM: 140 mmol/L (ref 135–145)

## 2018-02-20 MED ORDER — PREDNISONE 10 MG PO TABS: 10.0000 mg | ORAL_TABLET | Freq: Every day | ORAL | 0 refills | Status: DC

## 2018-02-20 MED ORDER — DILTIAZEM HCL ER COATED BEADS 120 MG PO CP24: 120.0000 mg | ORAL_CAPSULE | Freq: Every day | ORAL | 0 refills | Status: DC

## 2018-02-20 MED ORDER — LACTULOSE 10 GM/15ML PO SOLN: 20.0000 g | Freq: Two times a day (BID) | ORAL | 0 refills | Status: DC

## 2018-02-20 NOTE — Progress Notes (Signed)
1338 d/c paperwork given to patient and patient's friend. IV catheter removed from LEFT arm, intact w/no s/s of infection and/or infiltration noted at this time. Foley catheter removed, intact, patient tolerated well. Patient's friend to transport patient to [facility]. Patient assisted to friend's vehicle via w/c by staff.

## 2018-02-20 NOTE — Clinical Social Work Placement (Signed)
   CLINICAL SOCIAL WORK PLACEMENT  NOTE  Date:  02/20/2018  Patient Details  Name: [name] MRN: [id number] Date of Birth: 04/21/1941  Clinical Social Work is seeking post-discharge placement for this patient at the [place] level of care (*CSW will initial, date and re-position this form in  chart as items are completed):  Yes   Patient/family provided with [place] Work Department's list of facilities offering this level of care within the geographic area requested by the patient (or if unable, by the patient's family).  Yes   Patient/family informed of their freedom to choose among providers that offer the needed level of care, that participate in Medicare, Medicaid or managed care program needed by the patient, have an available bed and are willing to accept the patient.  Yes   Patient/family informed of [place]'s ownership interest in [facility] and [facility], as well as of the fact that they are under no obligation to receive care at these facilities.  PASRR submitted to EDS on       PASRR number received on       Existing PASRR number confirmed on 02/19/18     FL2 transmitted to all facilities in geographic area requested by pt/family on 02/19/18     FL2 transmitted to all facilities within larger geographic area on       Patient informed that his/her managed care company has contracts with or will negotiate with certain facilities, including the following:        Yes   Patient/family informed of bed offers received.  Patient chooses bed at [facility]     Physician recommends and patient chooses bed at      Patient to be transferred to [facility] on 02/20/18.  Patient to be transferred to facility by private car     Patient family notified on 02/20/18 of transfer.  Name of family member notified:  Joe (in the room)     PHYSICIAN       Additional Comment: Pt stable for dc today per MD. Pt  in agreement with transfer to [facility]. Jackey Loge at the SNF. Will send dc clinical when available. Family member to transport. No other SW needs for dc.   _______________________________________________ Shade Flood, LCSW 02/20/2018, 11:59 AM

## 2018-02-20 NOTE — Discharge Summary (Signed)
Physician Discharge Summary  Justin Parsons YKZ:[id number] DOB: 1941/11/03 DOA: 02/14/2018  PCP: Monico Blitz, MD  Admit date: 02/14/2018 Discharge date: 02/20/2018  Time spent: 45 minutes  Recommendations for Outpatient Follow-up:  -Will be discharged to SNF for ST-rehab purposes.   Discharge Diagnoses:  Active Problems:   GERD (gastroesophageal reflux disease)   Thrombocytopenia (HCC)   Acute renal failure superimposed on stage 3 chronic kidney disease (HCC)   Hypotension   Alcoholic cirrhosis of liver without ascites (HCC)   Atrial fibrillation, chronic (HCC)   Diarrhea   Generalized weakness   Discharge Condition: Stable and improved  Filed Weights   02/18/18 0500 02/19/18 0500 02/20/18 0500  Weight: 108.8 kg (239 lb 13.8 oz) 109.6 kg (241 lb 10 oz) 112.5 kg (248 lb 0.3 oz)    History of present illness:  As per Dr. Carles Collet on 3/29: Justin Parsons is a 77 y.o. male with medical history of NASH +/- Etoh cirrhosis,esophageal stricture, pyloric stenosis, esophagogastric junction outflow obstruction, chronic atrial fibrillation, CKD stage III, coronary artery disease presenting with 3-day history of diarrhea.  The patient states he has been having 2-3 watery bowel movements daily.  He was recently discharged from the hospital after stay from 01/05/18 through 01/15/18 during which he was treated for his intractable vomiting.  Esophageal manometry on 01/10/2018 showed esophago-gastric junction outflow obstruction.  The patient was referred to GI at [facility], Dr. Roney Mans.  During the hospitalization, the patient was also treated for aspiration pneumonia.  He was discharged with 7 days of Augmentin.  The patient subsequently followed up with Dr. Roney Mans on 01/20/18.  He recommended a timed barium esophagram to differentiate between gastroesophageal reflux disease and early achalasia; however, it does not appear that the patient followed up with the study.  Nevertheless, the patient  continues to tolerate his dysphagia 2 diet without any vomiting.  He denies any fevers, chills, abdominal pain, hematochezia, melena, hematemesis.  He denies any chest pain, shortness breath, hemoptysis, fevers, chills, headache.  He complains of generalized weakness to the point where he is having difficulty getting out of bed and feeling unsteady on his feet.  As result, the patient presented for further evaluation in the emergency department.  He denies any other new medications.  In the emergency department, the patient was hypotensive with systolic blood pressures in the 80s.  He was given 2 L of IV fluid with improvement of his systolic blood pressure into the low 90s.  He was afebrile with oxygen saturation 98% on room air.  BMP showed elevated serum creatinine 4.83 above his baseline.  CBC showed hemoglobin 11.1 with WBC 10.7.  Acute abdominal series was nonobstructive.  Chest x-ray was negative.  CT of the brain was unremarkable.     Hospital Course:   A. fib with RVR -Rates have been stable overnight on PO cardizem. -Will transition to cardizem CD for ease of administration. -Continue current dose of Coreg which is 3.125 twice daily.  Acute metabolic encephalopathy -Completely resolved. -2 CTs and one MRI of the brain  were negative for acute findings. -Patient had B12, TSH, RPR, ammonia were also within normal limits. -Patient had an ABG that did not show hypoxia or hypercarbia. -Patient was empirically treated for hepatic encephalopathy with lactulose. -As patient's encephalopathy improved with lactulose and he does have a history of cirrhosis, decision has been made to continue empiric lactulose at this time. -Baclofen has been discontinued. -EEG was performed that did not show any  epileptiform discharges. -There has been no evidence for acute infections including UTI or pneumonia.  Acute on chronic kidney disease stage III -Baseline creatinine appears to be around  1.5-1.8. -Creatinine on admission was 4.83, is currently down to 1.34 which is better than his baseline  Diarrhea -Resolved, presumed secondary to lactulose as stool pathogen panel and C. difficile have been negative.  Coronary artery disease -Stable, no chest pain.  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      Procedures:  None   Consultations:  None  Discharge Instructions  Discharge Instructions    Diet - low sodium heart healthy   Complete by:  As directed    Increase activity slowly   Complete by:  As directed      Allergies as of 02/20/2018   No Known Allergies     Medication List    STOP taking these medications   baclofen 10 MG tablet Commonly known as:  LIORESAL   calcium carbonate 500 MG chewable tablet Commonly known as:  TUMS - dosed in mg elemental calcium   cephALEXin 500 MG capsule Commonly known as:  KEFLEX   colchicine 0.6 MG tablet   furosemide 40 MG tablet Commonly known as:  LASIX   gabapentin 300 MG capsule Commonly known as:  NEURONTIN   NON FORMULARY   oxazepam 10 MG capsule Commonly known as:  SERAX     TAKE these medications   allopurinol 100 MG tablet Commonly known as:  ZYLOPRIM Take 1 tablet (100 mg total) by mouth daily. What changed:  how much to take   aspirin 81 MG chewable tablet Chew 81 mg by mouth daily.   atorvastatin 80 MG tablet Commonly known as:  LIPITOR Take 80 mg by mouth daily at 6 PM.   carvedilol 3.125 MG tablet Commonly known as:  COREG Take 1 tablet by mouth 2 (two) times daily.   clopidogrel 75 MG tablet Commonly known as:  PLAVIX Take 1 tablet (75 mg total) by mouth daily.   diltiazem 120 MG 24 hr capsule Commonly known as:  CARDIZEM CD Take 1 capsule (120 mg total) by mouth daily.   feeding supplement Liqd Take 1 Container by mouth 4 (four) times daily. What changed:     when to take this  reasons to take this   folic acid 1 MG tablet Commonly known as:  FOLVITE Take 1 tablet (1 mg total) by mouth daily.   lactulose 10 GM/15ML solution Commonly known as:  CHRONULAC Take 30 mLs (20 g total) by mouth 2 (two) times daily.   levothyroxine 50 MCG tablet Commonly known as:  SYNTHROID, LEVOTHROID Take 50 mcg by mouth daily before breakfast.   metoCLOPramide 5 MG tablet Commonly known as:  REGLAN 1 po 30 minutes prior to meals bid What changed:    how much to take  how to take this  when to take this  additional instructions   omeprazole 40 MG capsule Commonly known as:  PRILOSEC Take 1 capsule (40 mg total) by mouth 2 (two) times daily. What changed:  how much to take   polyethylene glycol packet Commonly known as:  MIRALAX / GLYCOLAX Take 17 g daily by mouth.   predniSONE 10 MG tablet Commonly known as:  DELTASONE Take 1 tablet (10 mg total) by mouth daily with breakfast. Take 6 tablets today and then decrease by 1 tablet daily until none are left.   traMADol 50  MG tablet Commonly known as:  ULTRAM Take 1 tablet (50 mg total) by mouth every 6 (six) hours as needed.   vitamin C 500 MG tablet Commonly known as:  ASCORBIC ACID Take 500 mg by mouth daily.   Vitamin D2 400 units Tabs Take 1 tablet by mouth 2 (two) times daily.      No Known Allergies Contact information for after-discharge care    [facility] .   Service:  Skilled Nursing Contact information: [address] [place] [phone]               The results of significant diagnostics from this hospitalization (including imaging, microbiology, ancillary and laboratory) are listed below for reference.    Significant Diagnostic Studies: Dg Lumbar Spine Complete  Result Date: 02/14/2018 CLINICAL DATA:  Increasing immobility over the last 2 weeks. Several falls. EXAM: LUMBAR SPINE - COMPLETE 4+ VIEW  COMPARISON:  Abdominal radiographs 01/05/2018. Abdominopelvic CT 09/20/2017. FINDINGS: Five lumbar type vertebral bodies. The alignment is stable and near anatomic. There is multilevel spondylosis with disc space narrowing most advanced at L1-2. There are paraspinal osteophytes and mild facet hypertrophy. No evidence of acute fracture or pars defect. Bilateral hip arthroplasties and splenic artery atherosclerosis noted. IMPRESSION: No acute findings. Multilevel spondylosis, similar to previous studies. Electronically Signed   By: Richardean Sale M.D.   On: 02/14/2018 12:37   Ct Head Wo Contrast  Result Date: 02/15/2018 CLINICAL DATA:  Altered mental status. EXAM: CT HEAD WITHOUT CONTRAST TECHNIQUE: Contiguous axial images were obtained from the base of the skull through the vertex without intravenous contrast. COMPARISON:  02/14/2018 FINDINGS: Brain: There is no evidence of acute infarct, intracranial hemorrhage, mass, midline shift, or extra-axial fluid collection. There is moderate cerebral atrophy. Periventricular white matter hypodensities are unchanged and nonspecific but compatible with mild chronic small vessel ischemic disease. Vascular: Calcified atherosclerosis at the skull base mild vertebrobasilar dolichoectasia. No hyperdense vessel. Skull: No fracture or suspicious osseous lesion. Sinuses/Orbits: Old left orbital floor fracture. Minimal bilateral ethmoid air cell and right maxillary sinus mucosal thickening. Clear mastoid air cells. Right ocular prosthesis. Left cataract extraction. Other: None. IMPRESSION: 1. No evidence of acute intracranial abnormality. 2. Moderate cerebral atrophy and mild chronic small vessel ischemic disease. Electronically Signed   By: Logan Bores M.D.   On: 02/15/2018 18:14   Ct Head Wo Contrast  Result Date: 02/14/2018 CLINICAL DATA:  Head trauma EXAM: CT HEAD WITHOUT CONTRAST TECHNIQUE: Contiguous axial images were obtained from the base of the skull through the  vertex without intravenous contrast. COMPARISON:  08/16/2017 FINDINGS: Brain: Diffuse cerebral atrophy. Mild chronic small vessel disease. No acute intracranial abnormality. Specifically, no hemorrhage, hydrocephalus, mass lesion, acute infarction, or significant intracranial injury. Vascular: No hyperdense vessel or unexpected calcification. Skull: No acute calvarial abnormality. Sinuses/Orbits: Visualized paranasal sinuses and mastoids clear. Orbital soft tissues unremarkable. Other: Locules of gas noted within the soft tissues in the left temporal region which appear to correspond to vessels, likely related to IV access. IMPRESSION: No acute intracranial abnormality. Atrophy, chronic microvascular disease. Electronically Signed   By: Rolm Baptise M.D.   On: 02/14/2018 11:43   Mr Jodene Nam Head Wo Contrast  Result Date: 02/17/2018 CLINICAL DATA:  Altered mental status. Unable to walk. Incontinence. History of cirrhosis and atrial fibrillation. EXAM: MRI HEAD WITHOUT CONTRAST MRA HEAD WITHOUT CONTRAST TECHNIQUE: Multiplanar, multiecho pulse sequences of the brain and surrounding structures were obtained without intravenous  contrast. Angiographic images of the head were obtained using MRA technique without contrast. COMPARISON:  Head CT 02/15/2018 and MRI/MRA 04/27/2017 FINDINGS: MRI HEAD FINDINGS The study is mildly to moderately motion degraded. Brain: There is no evidence of acute infarct, intracranial hemorrhage, mass, midline shift, or extra-axial fluid collection. There is moderate cerebral atrophy which is unchanged from the prior MRI. Periventricular white matter T2 hyperintensities are also unchanged and nonspecific but compatible with mild chronic small vessel ischemic disease. Vascular: Major intracranial vascular flow voids are preserved. Skull and upper cervical spine: No suspicious marrow lesion. Sinuses/Orbits: Right ocular prosthesis. Remote left orbital floor fracture. Paranasal sinuses and mastoid air  cells are clear. Other: None. MRA HEAD FINDINGS The study is severely motion degraded which limits assessment for arterial stenoses. The visualized distal vertebral arteries are patent to the basilar with the left being slightly larger than the right. There is apparent moderate narrowing of both distal V4 segments at the vertebrobasilar junction, although motion may be artifactually contributing to this appearance. The basilar artery is patent without definite significant stenosis when accounting for motion artifact. The PCAs are completely obscured by motion. The intracranial internal carotid arteries are patent but are intermittently completely obscured by motion. The ACAs and MCAs are largely obscured by motion artifact, although gross flow is evident in the proximal M1 and A1 segments bilaterally. No large aneurysm is identified. IMPRESSION: 1. Motion degraded examination without evidence of acute intracranial abnormality. 2. Moderate cerebral atrophy and mild chronic small vessel ischemic disease. 3. Severely motion degraded MRA. Grossly patent large vessels at the base of the brain as above. Electronically Signed   By: Logan Bores M.D.   On: 02/17/2018 08:48   Mr Brain Wo Contrast  Result Date: 02/17/2018 CLINICAL DATA:  Altered mental status. Unable to walk. Incontinence. History of cirrhosis and atrial fibrillation. EXAM: MRI HEAD WITHOUT CONTRAST MRA HEAD WITHOUT CONTRAST TECHNIQUE: Multiplanar, multiecho pulse sequences of the brain and surrounding structures were obtained without intravenous contrast. Angiographic images of the head were obtained using MRA technique without contrast. COMPARISON:  Head CT 02/15/2018 and MRI/MRA 04/27/2017 FINDINGS: MRI HEAD FINDINGS The study is mildly to moderately motion degraded. Brain: There is no evidence of acute infarct, intracranial hemorrhage, mass, midline shift, or extra-axial fluid collection. There is moderate cerebral atrophy which is unchanged from the  prior MRI. Periventricular white matter T2 hyperintensities are also unchanged and nonspecific but compatible with mild chronic small vessel ischemic disease. Vascular: Major intracranial vascular flow voids are preserved. Skull and upper cervical spine: No suspicious marrow lesion. Sinuses/Orbits: Right ocular prosthesis. Remote left orbital floor fracture. Paranasal sinuses and mastoid air cells are clear. Other: None. MRA HEAD FINDINGS The study is severely motion degraded which limits assessment for arterial stenoses. The visualized distal vertebral arteries are patent to the basilar with the left being slightly larger than the right. There is apparent moderate narrowing of both distal V4 segments at the vertebrobasilar junction, although motion may be artifactually contributing to this appearance. The basilar artery is patent without definite significant stenosis when accounting for motion artifact. The PCAs are completely obscured by motion. The intracranial internal carotid arteries are patent but are intermittently completely obscured by motion. The ACAs and MCAs are largely obscured by motion artifact, although gross flow is evident in the proximal M1 and A1 segments bilaterally. No large aneurysm is identified. IMPRESSION: 1. Motion degraded examination without evidence of acute intracranial abnormality. 2. Moderate cerebral atrophy and mild chronic small vessel ischemic  disease. 3. Severely motion degraded MRA. Grossly patent large vessels at the base of the brain as above. Electronically Signed   By: Logan Bores M.D.   On: 02/17/2018 08:48   US Renal  Result Date: 02/15/2018 CLINICAL DATA:  Acute on chronic renal failure EXAM: RENAL / URINARY TRACT ULTRASOUND COMPLETE COMPARISON:  Abdominal CT 09/20/2017 FINDINGS: Right Kidney: Length: 10 cm. Smooth cortical thinning. No hydronephrosis or evidence of mass. Left Kidney: Length: 9 cm. Cortical thinning. No hydronephrosis or evidence of mass. Bladder:  Decompressed.  No detected abnormality. IMPRESSION: 1. No hydronephrosis. 2. Symmetric renal atrophy. Electronically Signed   By: Monte Fantasia M.D.   On: 02/15/2018 09:58   Dg Chest Port 1 View  Result Date: 02/16/2018 CLINICAL DATA:  Shortness of breath. EXAM: PORTABLE CHEST 1 VIEW COMPARISON:  Radiograph of February 14, 2018. FINDINGS: Stable cardiomegaly. No pneumothorax is noted. Mild central pulmonary vascular congestion is noted. No significant pleural effusion is noted. No consolidative process is noted. Bony thorax is unremarkable. IMPRESSION: Stable cardiomegaly with central pulmonary vascular congestion. Electronically Signed   By: Marijo Conception, M.D.   On: 02/16/2018 19:14   Dg Abd Acute W/chest  Result Date: 02/14/2018 CLINICAL DATA:  Diarrhea with weakness and increasing immobility over the last 2 weeks. Several falls. EXAM: DG ABDOMEN ACUTE W/ 1V CHEST COMPARISON:  Radiographs 01/10/2018 and 01/05/2018. FINDINGS: Lordotic positioning on the chest radiograph. The heart appears mildly enlarged, but stable. There is atelectasis at both lung bases, but no confluent airspace opacity, pleural effusion or pneumothorax. Abdominal radiographs are somewhat limited by body habitus. There is no evidence of bowel distention, wall thickening or free air. There are no suspicious abdominal calcifications. Degenerative changes are present throughout the lumbar spine. Patient is status post bilateral total hip arthroplasty. IMPRESSION: No apparent acute findings in the chest, abdomen or pelvis. Cardiomegaly with mild bibasilar atelectasis. Electronically Signed   By: Richardean Sale M.D.   On: 02/14/2018 12:33   Dg Hip Unilat With Pelvis 2-3 Views Right  Result Date: 02/14/2018 CLINICAL DATA:  Increasing immobility over the last 2 weeks. Several falls. EXAM: DG HIP (WITH OR WITHOUT PELVIS) 2-3V RIGHT COMPARISON:  Abdominal radiographs 01/05/2018. Pelvic CT 09/20/2017. FINDINGS: Status post bilateral total  hip arthroplasty. There is no evidence of acute fracture or dislocation. The left femoral stem is incompletely visualized. No evidence of hardware loosening. Degenerative changes are present in the lower lumbar spine. There are mild sacroiliac degenerative changes. IMPRESSION: No acute osseous findings status post bilateral total hip arthroplasty. The left femoral stem is incompletely visualized. Electronically Signed   By: Richardean Sale M.D.   On: 02/14/2018 12:35    Microbiology: Recent Results (from the past 240 hour(s))  Urine culture     Status: Abnormal   Collection Time: 02/14/18 11:08 AM  Result Value Ref Range Status   Specimen Description   Final    URINE, CLEAN CATCH Performed at [facility], [address] [place], [place] [address]    Special Requests   Final    NONE Performed at [facility], [address] [place], [place] [address]    Culture (A)  Final    <10,000 COLONIES/mL Performed at [facility], [place] [address] [place], [place] [address]    Report Status 02/15/2018 FINAL  Final  MRSA PCR Screening     Status: None   Collection Time: 02/14/18  6:25 PM  Result Value Ref Range Status   MRSA by  PCR NEGATIVE NEGATIVE Final    Comment:        The GeneXpert MRSA Assay (FDA approved for NASAL specimens only), is one component of a comprehensive MRSA colonization surveillance program. It is not intended to diagnose MRSA infection nor to guide or monitor treatment for MRSA infections. Performed at [facility], [address] [place], [place] [address]   Culture, blood (Routine X 2) w Reflex to ID Panel     Status: None   Collection Time: 02/14/18  6:55 PM  Result Value Ref Range Status   Specimen Description RIGHT ANTECUBITAL  Final   Special Requests   Final    BOTTLES DRAWN AEROBIC AND ANAEROBIC Blood Culture adequate volume   Culture   Final    NO GROWTH 5 DAYS Performed at [facility], [address], [place], [place] [address]    Report  Status 02/19/2018 FINAL  Final  Culture, blood (Routine X 2) w Reflex to ID Panel     Status: None   Collection Time: 02/14/18  7:05 PM  Result Value Ref Range Status   Specimen Description BLOOD LEFT HAND  Final   Special Requests   Final    BOTTLES DRAWN AEROBIC ONLY Blood Culture adequate volume   Culture   Final    NO GROWTH 5 DAYS Performed at [facility], [address] [place], [place] [address]    Report Status 02/19/2018 FINAL  Final  Gastrointestinal Panel by PCR , Stool     Status: None   Collection Time: 02/15/18  3:49 AM  Result Value Ref Range Status   Campylobacter species NOT DETECTED NOT DETECTED Final   Plesimonas shigelloides NOT DETECTED NOT DETECTED Final   Salmonella species NOT DETECTED NOT DETECTED Final   Yersinia enterocolitica NOT DETECTED NOT DETECTED Final   Vibrio species NOT DETECTED NOT DETECTED Final   Vibrio cholerae NOT DETECTED NOT DETECTED Final   Enteroaggregative E coli (EAEC) NOT DETECTED NOT DETECTED Final   Enteropathogenic E coli (EPEC) NOT DETECTED NOT DETECTED Final   Enterotoxigenic E coli (ETEC) NOT DETECTED NOT DETECTED Final   Shiga like toxin producing E coli (STEC) NOT DETECTED NOT DETECTED Final   Shigella/Enteroinvasive E coli (EIEC) NOT DETECTED NOT DETECTED Final   Cryptosporidium NOT DETECTED NOT DETECTED Final   Cyclospora cayetanensis NOT DETECTED NOT DETECTED Final   Entamoeba histolytica NOT DETECTED NOT DETECTED Final   Giardia lamblia NOT DETECTED NOT DETECTED Final   Adenovirus F40/41 NOT DETECTED NOT DETECTED Final   Astrovirus NOT DETECTED NOT DETECTED Final   Norovirus GI/GII NOT DETECTED NOT DETECTED Final   Rotavirus A NOT DETECTED NOT DETECTED Final   Sapovirus (I, II, IV, and V) NOT DETECTED NOT DETECTED Final    Comment: Performed at [facility], [address] [place], [place] [address]  C difficile quick scan w PCR reflex     Status: None   Collection Time: 02/15/18  3:20 PM  Result Value Ref  Range Status   C Diff antigen NEGATIVE NEGATIVE Final   C Diff toxin NEGATIVE NEGATIVE Final   C Diff interpretation No C. difficile detected.  Final    Comment: Performed at [facility], [address] [place], [place] [address]     Labs: Basic Metabolic Panel: Recent Labs  Lab 02/15/18 0515 02/16/18 0339 02/17/18 0442 02/18/18 0530 02/20/18 0515  NA 137 138 144 143 140  K 3.6 3.9 3.9 4.1 5.2*  CL 96* 105 112* 115* 111  CO2 25 24 22  20* 23  GLUCOSE 74 154* 114* 133* 131*  BUN 61* 58* 47* 42* 34*  CREATININE 3.79* 3.04* 2.24* 1.81* 1.34*  CALCIUM 8.4* 8.7* 9.4 9.1 9.4  MG 1.5*  --   --   --   --    Liver Function Tests: Recent Labs  Lab 02/14/18 1122 02/15/18 0515  AST 19 20  ALT 13* 13*  ALKPHOS 88 98  BILITOT 0.9 1.0  PROT 5.4* 5.9*  ALBUMIN 2.5* 2.6*   Recent Labs  Lab 02/14/18 1122  LIPASE 22   Recent Labs  Lab 02/16/18 0339  AMMONIA 10   CBC: Recent Labs  Lab 02/14/18 1122 02/15/18 0515 02/16/18 0339 02/17/18 0442 02/18/18 0530 02/20/18 0515  WBC 10.7* 13.4* 10.9* 12.6* 16.6* 16.6*  NEUTROABS 7.4  --   --   --   --   --   HGB 11.1* 12.3* 10.8* 11.7* 10.7* 10.5*  HCT 33.6* 37.2* 33.2* 37.5* 33.6* 33.0*  MCV 91.6 91.0 92.0 93.3 92.8 93.8  PLT 137* 194 153 189 144* 141*   Cardiac Enzymes: Recent Labs  Lab 02/14/18 1122 02/15/18 1538 02/15/18 2018 02/16/18 0339  TROPONINI <0.03 <0.03 <0.03 <0.03   BNP: BNP (last 3 results) No results for input(s): BNP in the last 8760 hours.  ProBNP (last 3 results) No results for input(s): PROBNP in the last 8760 hours.  CBG: Recent Labs  Lab 02/16/18 0759  GLUCAP 140*       Signed:  [facility] Pager: [phone] 02/20/2018, 1:24 PM

## 2018-02-20 NOTE — Care Management Note (Signed)
Case Management Note  Patient Details  Name: Justin Parsons MRN: [id number] Date of Birth: 1940-12-12  If discussed at Long Length of Stay Meetings, dates discussed:   02/20/2018 Additional Comments:  Raihana Balderrama, Chauncey Reading, RN 02/20/2018, 12:09 PM

## 2018-02-20 NOTE — Progress Notes (Signed)
Physical Therapy Treatment Patient Details Name: Justin Parsons MRN: [id number] DOB: 1940-12-26 Today's Date: 02/20/2018    History of Present Illness Justin Parsons is a 77 y.o. male with medical history of  NASH +/- Etoh cirrhosis, esophageal stricture, pyloric stenosis, esophagogastric junction outflow obstruction, chronic atrial fibrillation, CKD stage III, coronary artery disease presenting with 3-day history of diarrhea.  The patient states he has been having 2-3 watery bowel movements daily.  He was recently discharged from the hospital after stay from 01/05/18 through 01/15/18 during which he was treated for his intractable vomiting.  Esophageal manometry on 01/10/2018 showed esophago-gastric junction outflow obstruction.  The patient was referred to GI at [facility], Dr. Roney Mans.  During the hospitalization, the patient was also treated for aspiration pneumonia.  He was discharged with 7 days of Augmentin.  The patient subsequently followed up with Dr. Roney Mans on 01/20/18.  He recommended a timed barium esophagram to differentiate between gastroesophageal reflux disease and early achalasia; however, it does not appear that the patient followed up with the study.  Nevertheless, the patient continues to tolerate his dysphagia 2 diet without any vomiting.  He denies any fevers, chills, abdominal pain, hematochezia, melena, hematemesis.  He denies any chest pain, shortness breath, hemoptysis, fevers, chills, headache.  He complains of generalized weakness to the point where he is having difficulty getting out of bed and feeling unsteady on his feet.  As result, the patient presented for further evaluation in the emergency department.  He denies any other new medications.    PT Comments    Patient presents up in chair (assisted by nursing staff) and agreeable for therapy.  Patient demonstrates labored movement for sit to stands requiring VC's for proper hand placement, limited to a few steps due to BLE  weakness and c/o difficulty breathing, required active assistance with seated marching in place and RLE LAQ's due to weakness, required frequent rest breaks with approximately 3-4 minutes recovery time and continued sitting up in chair after therapy.  Patient will benefit from continued physical therapy in hospital and recommended venue below to increase strength, balance, endurance for safe ADLs and gait.   Follow Up Recommendations  SNF     Equipment Recommendations  None recommended by PT    Recommendations for Other Services       Precautions / Restrictions Precautions Precautions: Fall Restrictions Weight Bearing Restrictions: No    Mobility  Bed Mobility               General bed mobility comments: Patient presents up in chair (assisted by nursing staff)  Transfers Overall transfer level: Needs assistance Equipment used: Standard walker Transfers: Sit to/from Stand Sit to Stand: Mod assist            Ambulation/Gait Ambulation/Gait assistance: Mod assist;Max assist Ambulation Distance (Feet): 2 Feet Assistive device: Standard walker Gait Pattern/deviations: Decreased step length - right;Decreased step length - left;Decreased stance time - right;Decreased stance time - left;Decreased stride length Gait velocity: decreased Gait velocity interpretation: Below normal speed for age/gender General Gait Details: Patient limited to 3-4 steps forward/backwards in front of chair, unable to take steps away from bedside/chair due to poor standing balance and BLE weakness   Stairs            Wheelchair Mobility    Modified Rankin (Stroke Patients Only)       Balance Overall balance assessment: Needs assistance Sitting-balance support: Feet supported;Bilateral upper extremity supported Sitting balance-Leahy Scale: Fair  Standing balance support: Bilateral upper extremity supported;During functional activity Standing balance-Leahy Scale: Poor Standing  balance comment: fair/poor with standard walker                            Cognition Arousal/Alertness: Awake/alert Behavior During Therapy: WFL for tasks assessed/performed Overall Cognitive Status: Within Functional Limits for tasks assessed                                        Exercises General Exercises - Lower Extremity Long Arc Quad: Seated;AROM;Strengthening;Both;10 reps Hip Flexion/Marching: Seated;AROM;Strengthening;Both;10 reps Toe Raises: Seated;AROM;Strengthening;Both;10 reps Heel Raises: Seated;AROM;Strengthening;Both;10 reps    General Comments        Pertinent Vitals/Pain Pain Assessment: No/denies pain    Home Living                      Prior Function            PT Goals (current goals can now be found in the care plan section) Acute Rehab PT Goals Patient Stated Goal: return home with friends to assist after rehab PT Goal Formulation: With patient Time For Goal Achievement: 03/04/18 Potential to Achieve Goals: Good Progress towards PT goals: Progressing toward goals    Frequency    Min 3X/week      PT Plan Current plan remains appropriate    Co-evaluation              AM-PAC PT "6 Clicks" Daily Activity  Outcome Measure  Difficulty turning over in bed (including adjusting bedclothes, sheets and blankets)?: A Little Difficulty moving from lying on back to sitting on the side of the bed? : A Little Difficulty sitting down on and standing up from a chair with arms (e.g., wheelchair, bedside commode, etc,.)?: A Lot Help needed moving to and from a bed to chair (including a wheelchair)?: A Lot Help needed walking in hospital room?: Total Help needed climbing 3-5 steps with a railing? : Total 6 Click Score: 12    End of Session   Activity Tolerance: Patient tolerated treatment well;Patient limited by fatigue Patient left: with call bell/phone within reach;in chair Nurse Communication: Mobility  status;Other (comment)(nursing staff aware patient left up in chair) PT Visit Diagnosis: Unsteadiness on feet (R26.81);Other abnormalities of gait and mobility (R26.89);Muscle weakness (generalized) (M62.81)     Time: 3953-2023 PT Time Calculation (min) (ACUTE ONLY): 31 min  Charges:  $Therapeutic Exercise: 8-22 mins $Therapeutic Activity: 8-22 mins                    G Codes:       11:40 AM, February 28, 2018 Justin Parsons, MPT Physical Therapist with [facility] 336 [phone] office [phone] mobile phone

## 2018-02-25 DIAGNOSIS — R29898 Other symptoms and signs involving the musculoskeletal system: Secondary | ICD-10-CM | POA: Diagnosis not present

## 2018-02-25 DIAGNOSIS — I4891 Unspecified atrial fibrillation: Secondary | ICD-10-CM | POA: Diagnosis not present

## 2018-02-25 DIAGNOSIS — I5032 Chronic diastolic (congestive) heart failure: Secondary | ICD-10-CM | POA: Diagnosis not present

## 2018-02-25 DIAGNOSIS — K7030 Alcoholic cirrhosis of liver without ascites: Secondary | ICD-10-CM | POA: Diagnosis not present

## 2018-02-26 DIAGNOSIS — K222 Esophageal obstruction: Secondary | ICD-10-CM | POA: Diagnosis not present

## 2018-02-26 DIAGNOSIS — R29898 Other symptoms and signs involving the musculoskeletal system: Secondary | ICD-10-CM | POA: Diagnosis not present

## 2018-02-26 DIAGNOSIS — N183 Chronic kidney disease, stage 3 (moderate): Secondary | ICD-10-CM | POA: Diagnosis not present

## 2018-02-26 DIAGNOSIS — I5032 Chronic diastolic (congestive) heart failure: Secondary | ICD-10-CM | POA: Diagnosis not present

## 2018-03-03 DIAGNOSIS — N183 Chronic kidney disease, stage 3 (moderate): Secondary | ICD-10-CM | POA: Diagnosis not present

## 2018-03-03 DIAGNOSIS — I5032 Chronic diastolic (congestive) heart failure: Secondary | ICD-10-CM | POA: Diagnosis not present

## 2018-03-06 DIAGNOSIS — K222 Esophageal obstruction: Secondary | ICD-10-CM | POA: Diagnosis not present

## 2018-03-06 DIAGNOSIS — K110 Atrophy of salivary gland: Secondary | ICD-10-CM | POA: Diagnosis not present

## 2018-03-06 DIAGNOSIS — R630 Anorexia: Secondary | ICD-10-CM | POA: Diagnosis not present

## 2018-03-10 DIAGNOSIS — K219 Gastro-esophageal reflux disease without esophagitis: Secondary | ICD-10-CM | POA: Diagnosis not present

## 2018-03-10 DIAGNOSIS — N183 Chronic kidney disease, stage 3 (moderate): Secondary | ICD-10-CM | POA: Diagnosis not present

## 2018-03-10 DIAGNOSIS — I5032 Chronic diastolic (congestive) heart failure: Secondary | ICD-10-CM | POA: Diagnosis not present

## 2018-03-10 DIAGNOSIS — K222 Esophageal obstruction: Secondary | ICD-10-CM | POA: Diagnosis not present

## 2018-03-16 DIAGNOSIS — M13832 Other specified arthritis, left wrist: Secondary | ICD-10-CM | POA: Diagnosis not present

## 2018-03-16 DIAGNOSIS — R262 Difficulty in walking, not elsewhere classified: Secondary | ICD-10-CM | POA: Diagnosis not present

## 2018-03-16 DIAGNOSIS — M13822 Other specified arthritis, left elbow: Secondary | ICD-10-CM | POA: Diagnosis not present

## 2018-03-16 DIAGNOSIS — I2510 Atherosclerotic heart disease of native coronary artery without angina pectoris: Secondary | ICD-10-CM | POA: Diagnosis not present

## 2018-03-16 DIAGNOSIS — I5032 Chronic diastolic (congestive) heart failure: Secondary | ICD-10-CM | POA: Diagnosis not present

## 2018-03-16 DIAGNOSIS — R1310 Dysphagia, unspecified: Secondary | ICD-10-CM | POA: Diagnosis not present

## 2018-03-18 ENCOUNTER — Ambulatory Visit: Payer: Medicare Other | Admitting: Nurse Practitioner

## 2018-03-21 DIAGNOSIS — E039 Hypothyroidism, unspecified: Secondary | ICD-10-CM | POA: Diagnosis present

## 2018-03-21 DIAGNOSIS — Z8673 Personal history of transient ischemic attack (TIA), and cerebral infarction without residual deficits: Secondary | ICD-10-CM | POA: Diagnosis not present

## 2018-03-21 DIAGNOSIS — K219 Gastro-esophageal reflux disease without esophagitis: Secondary | ICD-10-CM | POA: Diagnosis present

## 2018-03-21 DIAGNOSIS — N184 Chronic kidney disease, stage 4 (severe): Secondary | ICD-10-CM | POA: Diagnosis not present

## 2018-03-21 DIAGNOSIS — I509 Heart failure, unspecified: Secondary | ICD-10-CM | POA: Diagnosis not present

## 2018-03-21 DIAGNOSIS — K7460 Unspecified cirrhosis of liver: Secondary | ICD-10-CM | POA: Diagnosis not present

## 2018-03-21 DIAGNOSIS — I5033 Acute on chronic diastolic (congestive) heart failure: Secondary | ICD-10-CM | POA: Diagnosis not present

## 2018-03-21 DIAGNOSIS — R0602 Shortness of breath: Secondary | ICD-10-CM | POA: Diagnosis not present

## 2018-03-21 DIAGNOSIS — M109 Gout, unspecified: Secondary | ICD-10-CM | POA: Diagnosis present

## 2018-03-21 DIAGNOSIS — I130 Hypertensive heart and chronic kidney disease with heart failure and stage 1 through stage 4 chronic kidney disease, or unspecified chronic kidney disease: Secondary | ICD-10-CM | POA: Diagnosis not present

## 2018-03-21 DIAGNOSIS — N400 Enlarged prostate without lower urinary tract symptoms: Secondary | ICD-10-CM | POA: Diagnosis not present

## 2018-03-21 DIAGNOSIS — I4891 Unspecified atrial fibrillation: Secondary | ICD-10-CM | POA: Diagnosis not present

## 2018-03-21 DIAGNOSIS — M1990 Unspecified osteoarthritis, unspecified site: Secondary | ICD-10-CM | POA: Diagnosis present

## 2018-03-21 DIAGNOSIS — R0682 Tachypnea, not elsewhere classified: Secondary | ICD-10-CM | POA: Diagnosis not present

## 2018-04-09 DIAGNOSIS — I4891 Unspecified atrial fibrillation: Secondary | ICD-10-CM | POA: Diagnosis not present

## 2018-04-09 DIAGNOSIS — N184 Chronic kidney disease, stage 4 (severe): Secondary | ICD-10-CM | POA: Diagnosis not present

## 2018-04-09 DIAGNOSIS — Z6834 Body mass index (BMI) 34.0-34.9, adult: Secondary | ICD-10-CM | POA: Diagnosis not present

## 2018-04-09 DIAGNOSIS — M1990 Unspecified osteoarthritis, unspecified site: Secondary | ICD-10-CM | POA: Diagnosis not present

## 2018-04-09 DIAGNOSIS — K7030 Alcoholic cirrhosis of liver without ascites: Secondary | ICD-10-CM | POA: Diagnosis not present

## 2018-04-09 DIAGNOSIS — Z299 Encounter for prophylactic measures, unspecified: Secondary | ICD-10-CM | POA: Diagnosis not present

## 2018-04-09 DIAGNOSIS — I509 Heart failure, unspecified: Secondary | ICD-10-CM | POA: Diagnosis not present

## 2018-04-09 DIAGNOSIS — I10 Essential (primary) hypertension: Secondary | ICD-10-CM | POA: Diagnosis not present

## 2018-04-18 DIAGNOSIS — I959 Hypotension, unspecified: Secondary | ICD-10-CM | POA: Diagnosis not present

## 2018-04-18 DIAGNOSIS — M109 Gout, unspecified: Secondary | ICD-10-CM | POA: Diagnosis not present

## 2018-04-18 DIAGNOSIS — N185 Chronic kidney disease, stage 5: Secondary | ICD-10-CM | POA: Diagnosis not present

## 2018-04-18 DIAGNOSIS — I509 Heart failure, unspecified: Secondary | ICD-10-CM | POA: Diagnosis not present

## 2018-05-21 DIAGNOSIS — I4891 Unspecified atrial fibrillation: Secondary | ICD-10-CM | POA: Diagnosis not present

## 2018-05-21 DIAGNOSIS — Z6834 Body mass index (BMI) 34.0-34.9, adult: Secondary | ICD-10-CM | POA: Diagnosis not present

## 2018-05-21 DIAGNOSIS — I10 Essential (primary) hypertension: Secondary | ICD-10-CM | POA: Diagnosis not present

## 2018-05-21 DIAGNOSIS — Z299 Encounter for prophylactic measures, unspecified: Secondary | ICD-10-CM | POA: Diagnosis not present

## 2018-05-21 DIAGNOSIS — N184 Chronic kidney disease, stage 4 (severe): Secondary | ICD-10-CM | POA: Diagnosis not present

## 2018-05-21 DIAGNOSIS — N185 Chronic kidney disease, stage 5: Secondary | ICD-10-CM | POA: Diagnosis not present

## 2018-05-21 DIAGNOSIS — I509 Heart failure, unspecified: Secondary | ICD-10-CM | POA: Diagnosis not present

## 2018-05-27 DIAGNOSIS — I509 Heart failure, unspecified: Secondary | ICD-10-CM | POA: Diagnosis not present

## 2018-05-27 DIAGNOSIS — I959 Hypotension, unspecified: Secondary | ICD-10-CM | POA: Diagnosis not present

## 2018-05-27 DIAGNOSIS — E889 Metabolic disorder, unspecified: Secondary | ICD-10-CM | POA: Diagnosis not present

## 2018-05-27 DIAGNOSIS — M9080 Osteopathy in diseases classified elsewhere, unspecified site: Secondary | ICD-10-CM | POA: Diagnosis not present

## 2018-05-27 DIAGNOSIS — N185 Chronic kidney disease, stage 5: Secondary | ICD-10-CM | POA: Diagnosis not present

## 2018-06-09 ENCOUNTER — Ambulatory Visit: Payer: Medicare Other | Admitting: Nurse Practitioner

## 2018-07-09 DIAGNOSIS — I4891 Unspecified atrial fibrillation: Secondary | ICD-10-CM | POA: Diagnosis not present

## 2018-07-09 DIAGNOSIS — I10 Essential (primary) hypertension: Secondary | ICD-10-CM | POA: Diagnosis not present

## 2018-07-09 DIAGNOSIS — K7030 Alcoholic cirrhosis of liver without ascites: Secondary | ICD-10-CM | POA: Diagnosis not present

## 2018-07-09 DIAGNOSIS — Z6834 Body mass index (BMI) 34.0-34.9, adult: Secondary | ICD-10-CM | POA: Diagnosis not present

## 2018-07-09 DIAGNOSIS — M1990 Unspecified osteoarthritis, unspecified site: Secondary | ICD-10-CM | POA: Diagnosis not present

## 2018-07-09 DIAGNOSIS — Z299 Encounter for prophylactic measures, unspecified: Secondary | ICD-10-CM | POA: Diagnosis not present

## 2018-08-03 IMAGING — CT CT HEAD CODE STROKE
3 series · 15 of 47 positions shown, 18 images · non-contrast
Comparison: CT head from the same day at [HOSPITAL] [place]. CT head
without contrast 06/04/2012 at [HOSPITAL]

CLINICAL DATA: Code stroke.  Intermittent aphasia.

EXAM:
CT HEAD WITHOUT CONTRAST
TECHNIQUE: Contiguous axial images were obtained from the base of the skull
through the vertex without intravenous contrast.

[Series 3: head 5.0 st · axial · 0.43mm/px · z∈[-90,+45]mm · 9 of 33 slices shown, 12 images]
[im 3/33  brain]
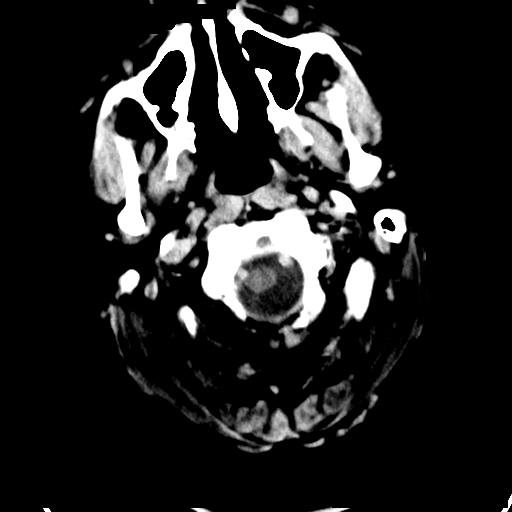
[im 3/33  bone]
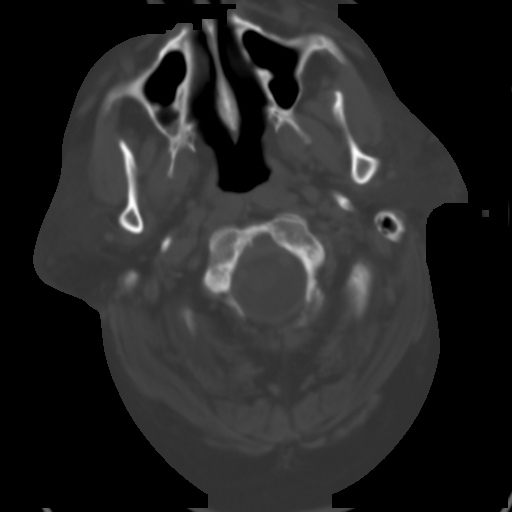
[im 6/33  brain]
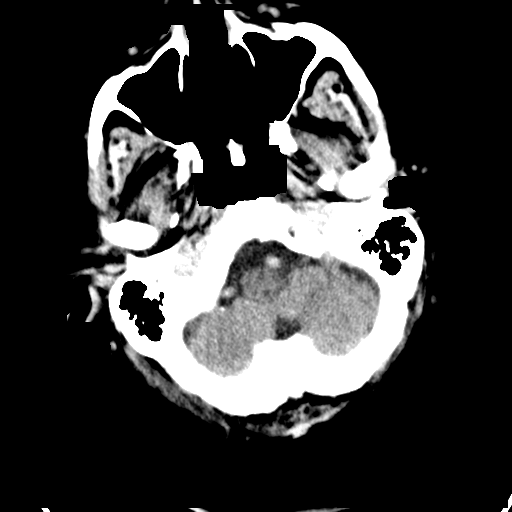
[im 9/33  brain]
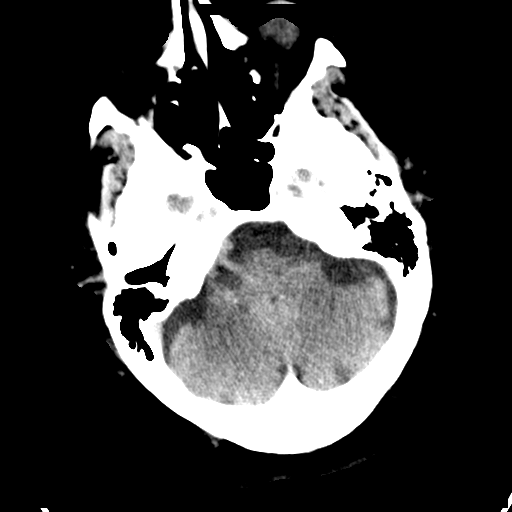
[im 13/33  brain]
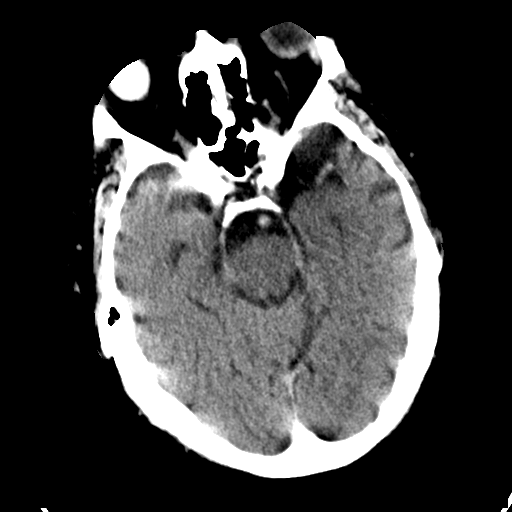
[im 17/33  brain]
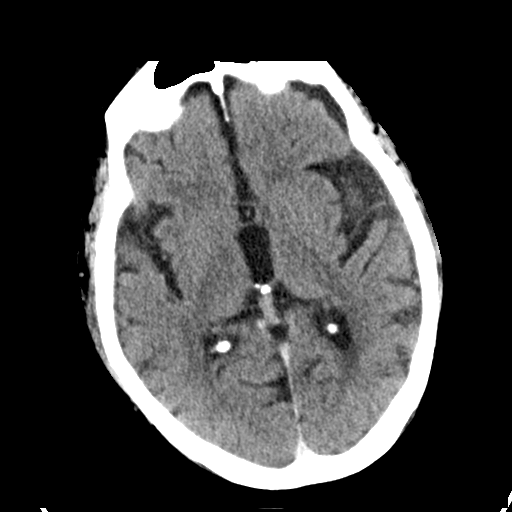
[im 17/33  bone]
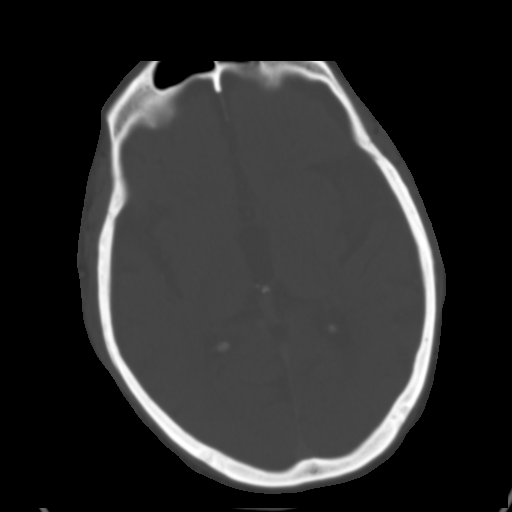
[im 20/33  brain]
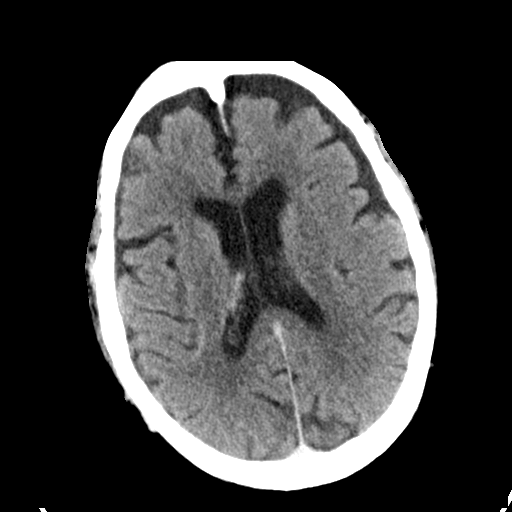
[im 24/33  brain]
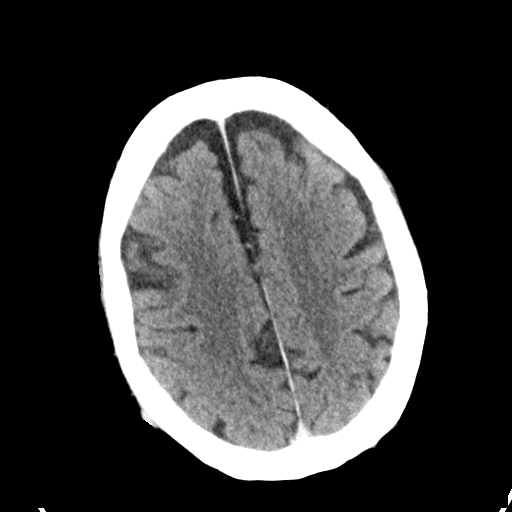
[im 27/33  brain]
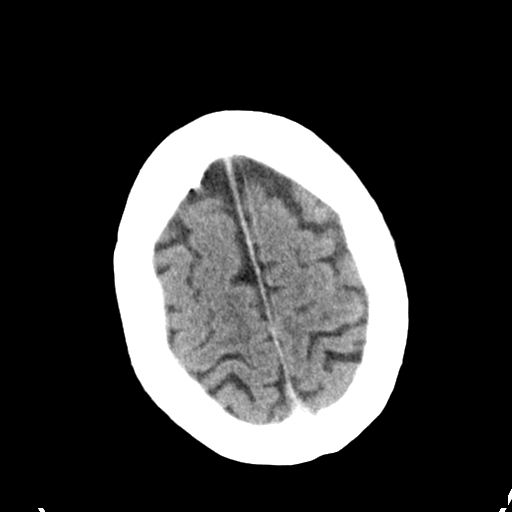
[im 30/33  brain]
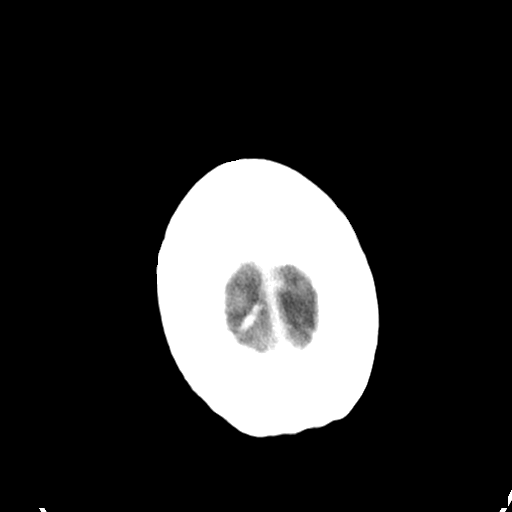
[im 30/33  bone]
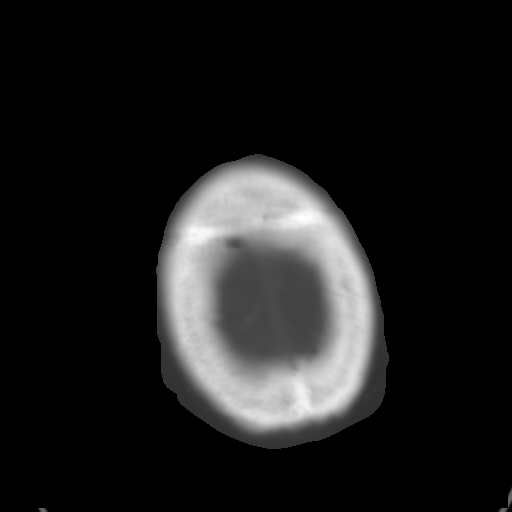

[Series 5: head 3.0 cor st · coronal · 0.33mm/px · 3 of 69 slices shown]
[im 23/69  brain]
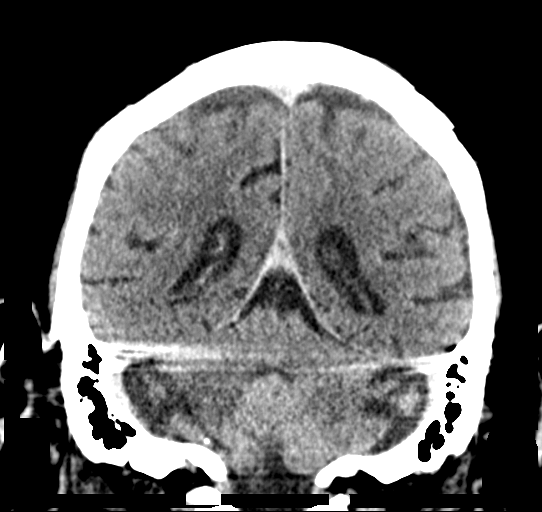
[im 31/69  brain]
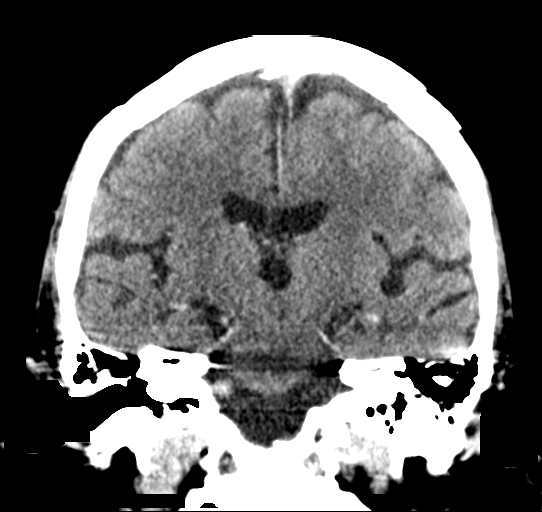
[im 38/69  brain]
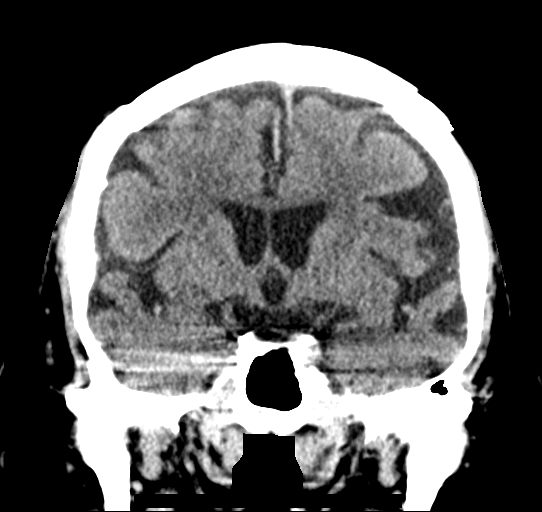

[Series 6: head 3.0 sag st · sagittal · 0.33mm/px · 3 of 58 slices shown]
[im 20/58  brain]
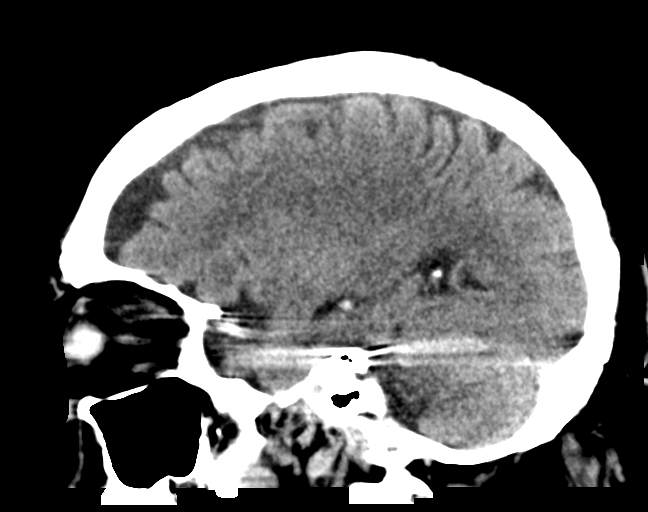
[im 29/58  brain]
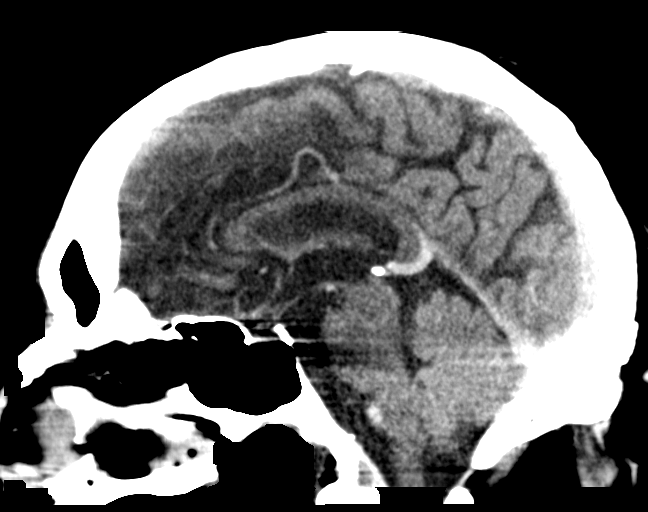
[im 39/58  brain]
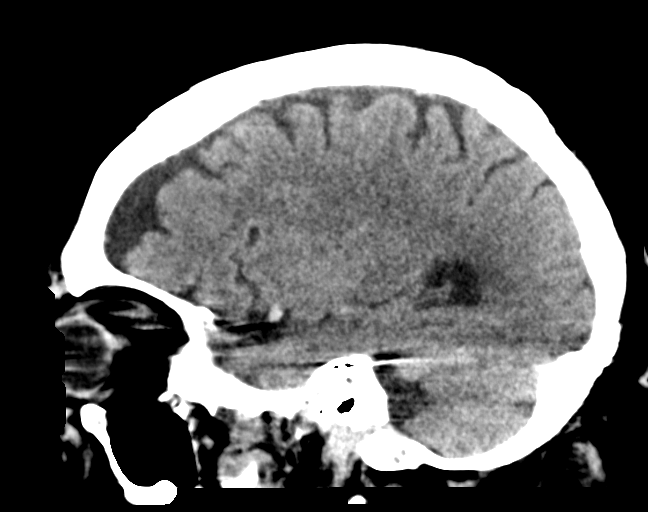

[15 of 47 positions shown; findings below may reference images not displayed]

FINDINGS: Brain: No acute infarct, hemorrhage, or mass lesion is present. Mild
atrophy and white matter changes are stable. No focal cortical
defect is evident. The basal ganglia and insular ribbon are intact.

Vascular: Vessels are somewhat hyperdense diffusely without
significant asymmetry. Atherosclerotic calcifications are present in
the cavernous internal carotid artery's.

Skull: Calvarium is intact. No focal lytic or blastic lesions are
present.

Sinuses/Orbits: Paranasal sinuses are clear. A remote left orbital
floor fracture is again noted. There is no evidence for entrapment
of the inferior rectus muscle. The globes and orbits are otherwise
within normal limits.

ASPECTS (Alberta Stroke Program Early CT Score)

- Ganglionic level infarction (caudate, lentiform nuclei, internal
capsule, insula, M1-M3 cortex): [DATE]

- Supraganglionic infarction (M4-M6 cortex): [DATE]

Total score (0-10 with 10 being normal): [DATE]
IMPRESSION: 1. No acute intracranial abnormality or significant interval change.
2. Stable atrophy and white matter disease.
3. ASPECTS is [DATE]
These results were pager texted at the time of interpretation on
04/27/2017 at [DATE] to Dr. Bhushan.

## 2018-08-11 DIAGNOSIS — I10 Essential (primary) hypertension: Secondary | ICD-10-CM | POA: Diagnosis not present

## 2018-08-11 DIAGNOSIS — Z7189 Other specified counseling: Secondary | ICD-10-CM | POA: Diagnosis not present

## 2018-08-11 DIAGNOSIS — Z1211 Encounter for screening for malignant neoplasm of colon: Secondary | ICD-10-CM | POA: Diagnosis not present

## 2018-08-11 DIAGNOSIS — Z0000 Encounter for general adult medical examination without abnormal findings: Secondary | ICD-10-CM | POA: Diagnosis not present

## 2018-08-11 DIAGNOSIS — Z1331 Encounter for screening for depression: Secondary | ICD-10-CM | POA: Diagnosis not present

## 2018-08-11 DIAGNOSIS — Z23 Encounter for immunization: Secondary | ICD-10-CM | POA: Diagnosis not present

## 2018-08-11 DIAGNOSIS — Z1339 Encounter for screening examination for other mental health and behavioral disorders: Secondary | ICD-10-CM | POA: Diagnosis not present

## 2018-08-11 DIAGNOSIS — Z299 Encounter for prophylactic measures, unspecified: Secondary | ICD-10-CM | POA: Diagnosis not present

## 2018-09-10 ENCOUNTER — Ambulatory Visit (INDEPENDENT_AMBULATORY_CARE_PROVIDER_SITE_OTHER): Payer: Medicare Other | Admitting: Nurse Practitioner

## 2018-09-10 ENCOUNTER — Encounter: Payer: Self-pay | Admitting: Nurse Practitioner

## 2018-09-10 ENCOUNTER — Other Ambulatory Visit (HOSPITAL_COMMUNITY)
Admission: RE | Admit: 2018-09-10 | Discharge: 2018-09-10 | Disposition: A | Discharge status: Home / Self Care | Payer: Medicare Other | Source: Ambulatory Visit | Attending: Nephrology | Admitting: Nephrology

## 2018-09-10 VITALS — BP 111/75 | HR 91 | Temp 97.1°F | Ht 68.0 in

## 2018-09-10 DIAGNOSIS — K219 Gastro-esophageal reflux disease without esophagitis: Secondary | ICD-10-CM | POA: Diagnosis not present

## 2018-09-10 DIAGNOSIS — R69 Illness, unspecified: Secondary | ICD-10-CM | POA: Insufficient documentation

## 2018-09-10 DIAGNOSIS — R1314 Dysphagia, pharyngoesophageal phase: Secondary | ICD-10-CM | POA: Diagnosis not present

## 2018-09-10 DIAGNOSIS — R112 Nausea with vomiting, unspecified: Secondary | ICD-10-CM

## 2018-09-10 LAB — RENAL FUNCTION PANEL
ALBUMIN: 3.8 g/dL (ref 3.5–5.0)
ANION GAP: 13 (ref 5–15)
BUN: 25 mg/dL — ABNORMAL HIGH (ref 8–23)
CALCIUM: 9.7 mg/dL (ref 8.9–10.3)
CO2: 30 mmol/L (ref 22–32)
CREATININE: 1.89 mg/dL — AB (ref 0.61–1.24)
Chloride: 95 mmol/L — ABNORMAL LOW (ref 98–111)
GFR calc Af Amer: 38 mL/min — ABNORMAL LOW (ref >60)
GFR, EST NON AFRICAN AMERICAN: 32 mL/min — AB (ref >60)
Glucose, Bld: 98 mg/dL (ref 70–99)
PHOSPHORUS: 2.5 mg/dL (ref 2.5–4.6)
Potassium: 2.9 mmol/L — ABNORMAL LOW (ref 3.5–5.1)
SODIUM: 138 mmol/L (ref 135–145)

## 2018-09-10 LAB — FERRITIN: Ferritin: 62 ng/mL (ref 24–336)

## 2018-09-10 LAB — IRON AND TIBC
IRON: 68 ug/dL (ref 45–182)
Saturation Ratios: 22 % (ref 17.9–39.5)
TIBC: 313 ug/dL (ref 250–450)
UIBC: 245 ug/dL

## 2018-09-10 LAB — HEMOGLOBIN AND HEMATOCRIT, BLOOD
HEMATOCRIT: 43.4 % (ref 39.0–52.0)
Hemoglobin: 13.8 g/dL (ref 13.0–17.0)

## 2018-09-10 MED ORDER — PANTOPRAZOLE SODIUM 40 MG PO TBEC: 40.0000 mg | DELAYED_RELEASE_TABLET | Freq: Two times a day (BID) | ORAL | 3 refills | Status: DC

## 2018-09-10 NOTE — Assessment & Plan Note (Signed)
Some persistent GERD symptoms, worse at night when laying flat on his back.  Less severe when he is laying on his side and during the day.  He is on twice daily Prilosec.  This is helped his dysphagia, as per below.  GERD is suboptimally controlled.  I will switch him from Prilosec to Protonix 40 mg twice daily.  Call if any worsening symptoms.  Follow-up in 3 months otherwise.

## 2018-09-10 NOTE — Assessment & Plan Note (Signed)
Rare, intermittent vomiting.  No hematemesis or melena.  Symptoms are significantly improved compared with the used to be in the setting of dysphasia.  He has been seen by [facility] for his symptoms.  He has not been back since May due to cost and difficulty in transportation.  Continue antireflux measures as per below.  Follow-up in 3 months.  Call if any worsening symptoms.

## 2018-09-10 NOTE — Assessment & Plan Note (Signed)
Dysphasia has improved significantly since being on antireflux medication.  He is not able to eat solid foods, although he still does not have an appetite for certain foods.  He does have breakthrough heartburn as per above which is frequent.  We will change his PPI from Prilosec to Protonix as per above.  Call if any worsening dysphagia symptoms.  Follow-up in 3 months.

## 2018-09-10 NOTE — Progress Notes (Signed)
Referring Provider: Monico Blitz, MD Primary Care Physician:  Monico Blitz, MD Primary GI:  Dr. Gala Romney  Chief Complaint  Patient presents with  . Dysphagia    HPI:   Justin Parsons is a 77 y.o. male who presents for dysphasia and vomiting.  The patient was last seen by our service for hospital admission when he was admitted from 01/05/2018 through 01/15/2018.  Primary diagnosis of gastric outlet obstruction with vomiting.  Patient had 3 esophageal dilation since September 2018 with persistent symptoms.  Barium pill esophagram showed moderate narrowing within the distal esophagus with barium tablets sticking in that area along with esophageal dysmotility.  Manometric study not consistent with achalasia and likely mechanical stricture.  Has appoint with [facility] on 01/20/2018 and it was recommended by GI to hold off on EGD due to him tolerating soft diet.  Recommend he follow-up with [facility] early the following week.  Urgent referral was made to [facility] and appointment was scheduled for 01/20/2018 at 3:30 PM with Dr. Roney Mans.  Review of records indicate he was seen by [facility] on 01/20/2018.  At that time he reported modest improvement in symptoms over the previous 2 weeks now able to tolerate solid foods without vomiting.  Possible that acid suppression therapy is improving his symptoms.  Recommended timed barium esophagram to assess for esophageal retention and evidence of achalasia.  May be a candidate for pneumatic esophageal dilation or standard dilation, defer Botox injections at this time.  He has not been seen by [facility] again since then.  Today he states he's doing ok overall. He is improved. He can eat some solid foods, some things he doesn't have an appetite for. Still havinf breakthrough GERD symptoms typically at night; not bad if he lays on his side, worse if he lays on his back. He is on Prilosec bid. Denies abdominal pain, rare vomiting (much better than he used to). Denies  hematemesis, hematochezia, melena.   Past Medical History:  Diagnosis Date  . Adenomatous polyp of colon   . Alcoholism ([place])    [place]  . Arthritis   . Atrial fibrillation ([place])   . Blindness of right eye 1997  . CAD (coronary artery disease)    Multivessel disease at cardiac catheterization June 2018, DES to LAD June 2018 with significant residual disease including diagonal and obtuse marginal vessels as well as RCA  . Cirrhosis of liver (HCC)    Associated portal gastropathy  . CKD (chronic kidney disease) stage 3, GFR 30-59 ml/min (HCC)   . Essential hypertension   . Hiatal hernia   . Hypothyroidism   . 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 TIA (transient ischemic attack)     Past Surgical History:  Procedure Laterality Date  . BIOPSY  12/23/2017   Procedure: BIOPSY;  Surgeon: Daneil Dolin, MD;  [facility];  Service: Endoscopy;;  esophagus  . CARDIAC CATHETERIZATION  2003  . COLONOSCOPY  08/2008   normal, repeat exam in 5-7 years  . COLONOSCOPY  2004   rectal adenomatous polyp  . COLONOSCOPY N/A 12/01/2014   ACZ:YSAYTK rectum/elongated colon  . CORONARY ATHERECTOMY N/A 05/03/2017   Procedure: Coronary Atherectomy;  Surgeon: Leonie Man, MD;  Location: [place] [facility];  Service: Cardiovascular;  Laterality: N/A;  . CORONARY STENT INTERVENTION N/A 05/03/2017   Procedure: Coronary Stent Intervention;  Surgeon: Leonie Man, MD;  Location: [place] CV LAB;  Service: Cardiovascular;  Laterality: N/A;  . ESOPHAGEAL DILATION N/A  02/07/2015   Procedure: ESOPHAGEAL DILATION;  Surgeon: Daneil Dolin, MD;  [facility];  Service: Endoscopy;  Laterality: N/A;  . ESOPHAGEAL DILATION N/A 08/17/2017   Procedure: ESOPHAGEAL DILATION;  Surgeon: Danie Binder, MD;  [facility];  Service: Endoscopy;  Laterality: N/A;  . ESOPHAGEAL DILATION N/A 09/23/2017   Procedure: ESOPHAGEAL DILATION;  Surgeon: Danie Binder, MD;  [facility];  Service: Endoscopy;  Laterality: N/A;  . ESOPHAGEAL MANOMETRY N/A 01/10/2018   Procedure: ESOPHAGEAL MANOMETRY (EM);  Surgeon: Danie Binder, MD;  [facility]: [facility];  Service: Endoscopy;  Laterality: N/A;  . ESOPHAGOGASTRODUODENOSCOPY  02/11/2012   Dr. Gala Romney: portal gastropathy, gastric erosions, esophageal ulcerations likely pill-induced, surveillance in 2 years  . ESOPHAGOGASTRODUODENOSCOPY N/A 12/01/2014   Dr. Volney American esophageal stricture dilated with the scope passage, portal gastropathy, negative H.pylori on gastric biopsies, esophageal biopsies benign  . ESOPHAGOGASTRODUODENOSCOPY N/A 02/07/2015   Procedure: ESOPHAGOGASTRODUODENOSCOPY (EGD);  Surgeon: Daneil Dolin, MD;  [facility];  Service: Endoscopy;  Laterality: N/A;  1115  . ESOPHAGOGASTRODUODENOSCOPY N/A 08/17/2017   benign-appearing esophageal stenosis s/p dilation, mild gastritis, pylorus stenosis s/p dilation  . ESOPHAGOGASTRODUODENOSCOPY N/A 09/23/2017   moderate benign-appearing instrinsic stenosis s/p dilation, mild gastritis  . ESOPHAGOGASTRODUODENOSCOPY N/A 10/18/2017   Benign-appearing esophageal stricture s/p dilation, gastritis, benign-appearing intrinsice moderate pylorus stenosis s/p dilation  . ESOPHAGOGASTRODUODENOSCOPY (EGD) WITH PROPOFOL N/A 12/23/2017   Dr. Gala Romney: single short Grade 2 varix mid esophagus, just above GE junction was nearly circumferential denuding ulcerations/loss of normal mucosal appearance (somewhat punched out) mucosa. Query pill induced injury with path negative for malignancy. Portal hypertensive gastropathy, patent pylorus, normal duodenal bulb and second portion of duodenum, no dilation performed.   Marland Kitchen EYE SURGERY     RIGHT EYE REMOVED  . JOINT REPLACEMENT    . LEFT HEART CATH AND CORONARY ANGIOGRAPHY N/A 05/01/2017   Procedure: Left Heart Cath and Coronary Angiography;  Surgeon: Leonie Man, MD;  Location: [place] [facility];  Service: Cardiovascular;   Laterality: N/A;  . Venia Minks DILATION N/A 10/18/2017   Procedure: Venia Minks DILATION;  Surgeon: Danie Binder, MD;  [facility];  Service: Endoscopy;  Laterality: N/A;  . s/p eye implant  1997   artificial eye, right  . SAVORY DILATION N/A 10/18/2017   Procedure: SAVORY DILATION;  Surgeon: Danie Binder, MD;  [facility];  Service: Endoscopy;  Laterality: N/A;  . TOTAL HIP ARTHROPLASTY  2002  . TOTAL HIP ARTHROPLASTY  2006/2012   revision in 2012  . TOTAL KNEE ARTHROPLASTY  1999/2003   left/right    Current Outpatient Medications  Medication Sig Dispense Refill  . allopurinol (ZYLOPRIM) 100 MG tablet Take 1 tablet (100 mg total) by mouth daily. (Patient taking differently: Take 300 mg by mouth daily. ) 30 tablet 1  . aspirin 81 MG chewable tablet Chew 81 mg by mouth daily.    Marland Kitchen atorvastatin (LIPITOR) 80 MG tablet Take 80 mg by mouth daily at 6 PM.     . calcium carbonate (ANTACID EXTRA STRENGTH) 750 MG chewable tablet Chew 1 tablet by mouth as needed for heartburn.    . Cholecalciferol (VITAMIN D3) 2000 units CHEW Chew by mouth daily.    . clopidogrel (PLAVIX) 75 MG tablet Take 1 tablet (75 mg total) by mouth daily.    . folic acid (FOLVITE) 1 MG tablet Take 1 tablet (1 mg total) by mouth daily.    Marland Kitchen  levothyroxine (SYNTHROID, LEVOTHROID) 75 MCG tablet Take 75 mcg by mouth daily.  6  . metoCLOPramide (REGLAN) 5 MG tablet 1 po 30 minutes prior to meals bid (Patient taking differently: Take 5 mg by mouth 2 (two) times daily before a meal. 1 po 30 minutes prior to meals bid) 60 tablet 11  . omeprazole (PRILOSEC) 40 MG capsule Take 1 capsule (40 mg total) by mouth 2 (two) times daily. (Patient taking differently: Take 20 mg by mouth 2 (two) times daily. ) 60 capsule 5  . oxazepam (SERAX) 10 MG capsule Take 1 capsule by mouth at bedtime as needed.    . torsemide (DEMADEX) 20 MG tablet Take 1 tablet by mouth daily.    . traMADol (ULTRAM) 50 MG tablet Take 1 tablet (50 mg  total) by mouth every 6 (six) hours as needed. 6 tablet 0  . vitamin C (ASCORBIC ACID) 500 MG tablet Take 250 mg by mouth daily.     . carvedilol (COREG) 3.125 MG tablet Take 1 tablet by mouth 2 (two) times daily.    Marland Kitchen diltiazem (CARDIZEM CD) 120 MG 24 hr capsule Take 1 capsule (120 mg total) by mouth daily. (Patient not taking: Reported on 09/10/2018) 30 capsule 0  . Ergocalciferol (VITAMIN D2) 400 units TABS Take 1 tablet by mouth 2 (two) times daily.    . feeding supplement (BOOST / RESOURCE BREEZE) LIQD Take 1 Container by mouth 4 (four) times daily. (Patient not taking: Reported on 09/10/2018) 120 Container 0  . lactulose (CHRONULAC) 10 GM/15ML solution Take 30 mLs (20 g total) by mouth 2 (two) times daily. (Patient not taking: Reported on 09/10/2018) 240 mL 0  . levothyroxine (SYNTHROID, LEVOTHROID) 50 MCG tablet Take 50 mcg by mouth daily before breakfast.     . pantoprazole (PROTONIX) 40 MG tablet Take 1 tablet (40 mg total) by mouth 2 (two) times daily before a meal. 60 tablet 3  . polyethylene glycol (MIRALAX / GLYCOLAX) packet Take 17 g daily by mouth. (Patient not taking: Reported on 09/10/2018) 14 each 0  . predniSONE (DELTASONE) 10 MG tablet Take 1 tablet (10 mg total) by mouth daily with breakfast. Take 6 tablets today and then decrease by 1 tablet daily until none are left. (Patient not taking: Reported on 09/10/2018) 21 tablet 0   No current facility-administered medications for this visit.     Allergies as of 09/10/2018  . (No Known Allergies)    Family History  Problem Relation Age of Onset  . Ovarian cancer Sister   . Colon cancer Neg Hx     Social History   Socioeconomic History  . Marital status: Divorced    Spouse name: Not on file  . Number of children: Not on file  . Years of education: Not on file  . Highest education level: Not on file  Occupational History  . Not on file  Social Needs  . Financial resource strain: Not on file  . Food insecurity:     Worry: Not on file    Inability: Not on file  . Transportation needs:    Medical: Not on file    Non-medical: Not on file  Tobacco Use  . Smoking status: Never Smoker  . Smokeless tobacco: Never Used  . Tobacco comment: used to chew tobacco, none in 15 years  Substance and Sexual Activity  . Alcohol use: Yes    Alcohol/week: 0.0 standard drinks    Frequency: Never    Comment: occ beer  .  Drug use: No  . Sexual activity: Not Currently  Lifestyle  . Physical activity:    Days per week: Not on file    Minutes per session: Not on file  . Stress: Not on file  Relationships  . Social connections:    Talks on phone: Not on file    Gets together: Not on file    Attends religious service: Not on file    Active member of club or organization: Not on file    Attends meetings of clubs or organizations: Not on file    Relationship status: Not on file  Other Topics Concern  . Not on file  Social History Narrative   One son deceased while in prison, drug addiction.    Review of Systems: General: Negative for anorexia, weight loss, fever, chills, fatigue, weakness. ENT: Negative for hoarseness, difficulty swallowing. CV: Negative for chest pain, angina, palpitations, peripheral edema.  Respiratory: Negative for dyspnea at rest, cough, sputum, wheezing.  GI: See history of present illness. MS: Negative for joint pain, low back pain.  Derm: Negative for rash or itching.  Endo: Negative for unusual weight change.  Heme: Negative for bruising or bleeding.   Physical Exam: BP 111/75   Pulse 91   Temp (!) 97.1 F (36.2 C) (Oral)   Ht 5' 8"  (1.727 m)   BMI 37.71 kg/m  General:   Alert and oriented. Pleasant and cooperative. Well-nourished and well-developed.  Eyes:  Without icterus, sclera clear and conjunctiva pink.  Ears:  Normal auditory acuity. Cardiovascular:  S1, S2 present without murmurs appreciated. Extremities without clubbing or edema. Respiratory:  Clear to  auscultation bilaterally. No wheezes, rales, or rhonchi. No distress.  Gastrointestinal:  +BS, soft, non-tender and non-distended. No HSM noted. No guarding or rebound. No masses appreciated.  Rectal:  Deferred  Musculoskalatal:  Symmetrical without gross deformities. Neurologic:  Alert and oriented x4;  grossly normal neurologically. Psych:  Alert and cooperative. Normal mood and affect. Heme/Lymph/Immune: No excessive bruising noted.    09/10/2018 2:47 PM   Disclaimer: This note was dictated with voice recognition software. Similar sounding words can inadvertently be transcribed and may not be corrected upon review.

## 2018-09-10 NOTE — Patient Instructions (Signed)
1. Stop taking Prilosec (omeprazole). 2. Start taking Protonix (pantoprazole) 40 mg, twice a day, 30 minutes before eating a meal. 3. Call us if you have any worsening swallowing difficulties or worsening heartburn. 4. Call if you have any worsening vomiting. 5. Return for follow-up in 3 months. 6. Call us if you have any questions or concerns.  At [facility] we value your feedback. You may receive a survey about your visit today. Please share your experience as we strive to create trusting relationships with our patients to provide genuine, compassionate, quality care.  We appreciate your understanding and patience as we review any laboratory studies, imaging, and other diagnostic tests that are ordered as we care for you. Our office policy is 5 business days for review of these results, and any emergent or urgent results are addressed in a timely manner for your best interest. If you do not hear from our office in 1 week, please contact us.   We also encourage the use of MyChart, which contains your medical information for your review as well. If you are not enrolled in this feature, an access code is on this after visit summary for your convenience. Thank you for allowing [place] to be involved in your care.  It was great to see you today!  I hope you have a great Fall!!

## 2018-09-11 LAB — VITAMIN D 25 HYDROXY (VIT D DEFICIENCY, FRACTURES): VIT D 25 HYDROXY: 69.7 ng/mL (ref 30.0–100.0)

## 2018-09-11 LAB — PTH, INTACT AND CALCIUM
Calcium, Total (PTH): 9.7 mg/dL (ref 8.6–10.2)
PTH: 29 pg/mL (ref 15–65)

## 2018-09-11 NOTE — Progress Notes (Signed)
CC'D TO PCP °

## 2018-09-16 DIAGNOSIS — I509 Heart failure, unspecified: Secondary | ICD-10-CM | POA: Diagnosis not present

## 2018-09-16 DIAGNOSIS — M109 Gout, unspecified: Secondary | ICD-10-CM | POA: Diagnosis not present

## 2018-09-16 DIAGNOSIS — N183 Chronic kidney disease, stage 3 (moderate): Secondary | ICD-10-CM | POA: Diagnosis not present

## 2018-09-16 DIAGNOSIS — I959 Hypotension, unspecified: Secondary | ICD-10-CM | POA: Diagnosis not present

## 2018-09-23 ENCOUNTER — Telehealth: Payer: Self-pay | Admitting: Internal Medicine

## 2018-09-23 ENCOUNTER — Telehealth: Payer: Self-pay

## 2018-09-23 DIAGNOSIS — K219 Gastro-esophageal reflux disease without esophagitis: Secondary | ICD-10-CM

## 2018-09-23 NOTE — Telephone Encounter (Signed)
Pt started Pantoprazle as directed at his October appointment. He says it's not working and he would like another medication sent to his pharmacy.

## 2018-09-23 NOTE — Telephone Encounter (Signed)
Pt left Vm that he needs a medication called to pharmacy. He did not say the name of the medication.  Forwarding to AM, this is RMR pt.

## 2018-09-23 NOTE — Telephone Encounter (Signed)
Pt called to say that the medication that [facility] put him on last time he was here was not helping any and he needed something else called into [facility].

## 2018-09-23 NOTE — Telephone Encounter (Signed)
Lmom, waiting on a return call.  

## 2018-09-26 NOTE — Telephone Encounter (Signed)
Have him pick up samples of Dexilant x 2 weeks to try. Call with progress report in 2-4 weeks.  His PPI selection on complicated by taking Plavix. Interactions check and Dexilant-Plavix is rated "B" (no action needed) with minor severity per UTD.

## 2018-09-29 NOTE — Telephone Encounter (Signed)
Spoke with pt. Samples are ready for pickup.

## 2018-10-02 DIAGNOSIS — I509 Heart failure, unspecified: Secondary | ICD-10-CM | POA: Diagnosis not present

## 2018-10-02 DIAGNOSIS — M159 Polyosteoarthritis, unspecified: Secondary | ICD-10-CM | POA: Diagnosis not present

## 2018-10-08 ENCOUNTER — Telehealth: Payer: Self-pay | Admitting: Internal Medicine

## 2018-10-08 DIAGNOSIS — R1314 Dysphagia, pharyngoesophageal phase: Secondary | ICD-10-CM

## 2018-10-08 DIAGNOSIS — R112 Nausea with vomiting, unspecified: Secondary | ICD-10-CM

## 2018-10-08 DIAGNOSIS — Z8719 Personal history of other diseases of the digestive system: Secondary | ICD-10-CM

## 2018-10-08 DIAGNOSIS — K219 Gastro-esophageal reflux disease without esophagitis: Secondary | ICD-10-CM

## 2018-10-08 NOTE — Telephone Encounter (Signed)
Pt called to say his medicine that we gave him isn't helping, but he wants a prescription called into [facility]. [phone]

## 2018-10-08 NOTE — Telephone Encounter (Signed)
Lmom, waiting on a return call.  

## 2018-10-09 MED ORDER — DEXLANSOPRAZOLE 60 MG PO CPDR: 60.0000 mg | DELAYED_RELEASE_CAPSULE | Freq: Every day | ORAL | 1 refills | Status: DC

## 2018-10-09 NOTE — Telephone Encounter (Signed)
Lmom, pt notified.

## 2018-10-09 NOTE — Telephone Encounter (Signed)
Rx sent to pharmacy   

## 2018-10-09 NOTE — Addendum Note (Signed)
Addended by: Gordy Levan, Classie Weng A on: 10/09/2018 01:14 PM   Modules accepted: Orders

## 2018-10-09 NOTE — Telephone Encounter (Signed)
Pt called back and tried Dexilant samples. He would like it sent into his pharmacy.

## 2018-10-20 DIAGNOSIS — I509 Heart failure, unspecified: Secondary | ICD-10-CM | POA: Diagnosis not present

## 2018-10-20 DIAGNOSIS — M109 Gout, unspecified: Secondary | ICD-10-CM | POA: Diagnosis not present

## 2018-10-20 DIAGNOSIS — Z6834 Body mass index (BMI) 34.0-34.9, adult: Secondary | ICD-10-CM | POA: Diagnosis not present

## 2018-10-20 DIAGNOSIS — K5909 Other constipation: Secondary | ICD-10-CM | POA: Diagnosis not present

## 2018-10-20 DIAGNOSIS — Z299 Encounter for prophylactic measures, unspecified: Secondary | ICD-10-CM | POA: Diagnosis not present

## 2018-10-20 DIAGNOSIS — I10 Essential (primary) hypertension: Secondary | ICD-10-CM | POA: Diagnosis not present

## 2018-10-20 DIAGNOSIS — M1990 Unspecified osteoarthritis, unspecified site: Secondary | ICD-10-CM | POA: Diagnosis not present

## 2018-10-27 ENCOUNTER — Telehealth: Payer: Self-pay | Admitting: Internal Medicine

## 2018-10-27 NOTE — Telephone Encounter (Signed)
PATIENT CALLED AND SAID SOMEONE CALLED HIM FROM THIS OFFICE AND TO PLEASE CALL HIM BACK

## 2018-10-27 NOTE — Telephone Encounter (Signed)
Lmom, waiting on a return call.  

## 2018-10-29 NOTE — Telephone Encounter (Signed)
Spoke with pt. He went back to Pantoprazole since he couldn't afford the Dexilant.

## 2018-12-02 DIAGNOSIS — M159 Polyosteoarthritis, unspecified: Secondary | ICD-10-CM | POA: Diagnosis not present

## 2018-12-02 DIAGNOSIS — I509 Heart failure, unspecified: Secondary | ICD-10-CM | POA: Diagnosis not present

## 2018-12-17 ENCOUNTER — Encounter: Payer: Self-pay | Admitting: Nurse Practitioner

## 2018-12-17 ENCOUNTER — Encounter: Payer: Self-pay | Admitting: Internal Medicine

## 2018-12-17 ENCOUNTER — Ambulatory Visit (INDEPENDENT_AMBULATORY_CARE_PROVIDER_SITE_OTHER): Payer: Medicare Other | Admitting: Nurse Practitioner

## 2018-12-17 VITALS — BP 90/67 | HR 94 | Temp 97.1°F | Ht 68.0 in

## 2018-12-17 DIAGNOSIS — K219 Gastro-esophageal reflux disease without esophagitis: Secondary | ICD-10-CM | POA: Diagnosis not present

## 2018-12-17 DIAGNOSIS — K222 Esophageal obstruction: Secondary | ICD-10-CM

## 2018-12-17 DIAGNOSIS — K311 Adult hypertrophic pyloric stenosis: Secondary | ICD-10-CM | POA: Diagnosis not present

## 2018-12-17 NOTE — Assessment & Plan Note (Signed)
History of gastric outlet obstruction with frequent vomiting.  He has not had any significant vomiting as of late.  He was referred to [facility] for his gastric outlet obstruction and has not been back since March of last year.  It appears his symptoms are quiescent at this time.  Recommend he continue monitoring, notify us of any worsening symptoms.  Otherwise, follow-up in 6 months.

## 2018-12-17 NOTE — Progress Notes (Addendum)
Referring Provider: Monico Blitz, MD Primary Care Physician:  Monico Blitz, MD Primary GI:  Dr. Gala Romney  Chief Complaint  Patient presents with  . Gastroesophageal Reflux    HPI:   Justin Parsons is a 78 y.o. male who presents for follow-up on GERD and dysphasia.  The patient was last seen in our office 09/10/2018 for the same as well as nausea and vomiting.  Previous hospital admission in February 2019 for gastric outlet obstruction with vomiting.  3 esophageal dilations in September 2018 with persistent symptoms.  Barium pill esophagram showed moderate narrowing within the distal esophagus and barium tablet sticking in that area along with esophageal dysmotility.  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  Appointment with [facility] in March and it was recommended to hold off on local EGD because she was tolerating a soft diet.  He was seen by at [facility] in March 2019 and it was noted he was having some modest improvement over the previous 2 weeks now able to tolerate solid foods without vomiting.  Possible that acid suppression therapy is improving his symptoms and I recommended timed barium esophagram to assess for esophageal retention and evidence of achalasia.  Query candidacy for pneumatic esophageal dilation or standard dilation, defer Botox injections at this time.  When we last saw him he had not been back to [place] since.  He was doing overall okay, improved.  He can eat some solid foods and something she does not have an appetite for.  Some breakthrough GERD typically at night but not bad if he lays on his side.  Worse if he lays on his back.  On Prilosec twice daily.  No other GI symptoms.  Recommended change Prilosec to Protonix 40 mg twice daily, call for any worsening symptoms, follow-up in 3 months.  He called our office saying pantoprazole was not effective.  He was provided samples of Dexilant.  Of note an interaction between Dexilant  Plavix was rated as "B" with no action needed and minor severity.  The patient called our office 2 weeks later requesting Dexilant prescription which was sent to the pharmacy.  However, Dexilant was not affordable and he went back to pantoprazole.  Today he states he's doing ok overall. Dexilant was too expensive, Protonix didn't work. Is back on Prilosec which works the best of all of them. Still needs to take TUMS daily. Denies abdominal pain, persistent N/V, hematochezia, melena. Appetite is minimal, doesn't eat much. Denies chest pain, dyspnea, dizziness, lightheadedness, syncope, near syncope. Denies any other upper or lower GI symptoms.  Past Medical History:  Diagnosis Date  . Adenomatous polyp of colon   . Alcoholism ([place])    [place]  . Arthritis   . Atrial fibrillation ([place])   . Blindness of right eye 1997  . CAD (coronary artery disease)    Multivessel disease at cardiac catheterization June 2018, DES to LAD June 2018 with significant residual disease including diagonal and obtuse marginal vessels as well as RCA  . Cirrhosis of liver (HCC)    Associated portal gastropathy  . CKD (chronic kidney disease) stage 3, GFR 30-59 ml/min (HCC)   . Essential hypertension   . Hiatal hernia   . Hypothyroidism   . 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 TIA (transient ischemic attack)     Past Surgical History:  Procedure Laterality Date  . BIOPSY  12/23/2017   Procedure: BIOPSY;  Surgeon: Daneil Dolin, MD;  [facility];  Service: Endoscopy;;  esophagus  . CARDIAC CATHETERIZATION  2003  . COLONOSCOPY  08/2008   normal, repeat exam in 5-7 years  . COLONOSCOPY  2004   rectal adenomatous polyp  . COLONOSCOPY N/A 12/01/2014   OYD:XAJOIN rectum/elongated colon  . CORONARY ATHERECTOMY N/A 05/03/2017   Procedure: Coronary Atherectomy;  Surgeon: Leonie Man, MD;  Location: [place] [facility];  Service: Cardiovascular;  Laterality: N/A;  . CORONARY STENT INTERVENTION N/A  05/03/2017   Procedure: Coronary Stent Intervention;  Surgeon: Leonie Man, MD;  Location: [place] [facility];  Service: Cardiovascular;  Laterality: N/A;  . ESOPHAGEAL DILATION N/A 02/07/2015   Procedure: ESOPHAGEAL DILATION;  Surgeon: Daneil Dolin, MD;  [facility];  Service: Endoscopy;  Laterality: N/A;  . ESOPHAGEAL DILATION N/A 08/17/2017   Procedure: ESOPHAGEAL DILATION;  Surgeon: Danie Binder, MD;  [facility];  Service: Endoscopy;  Laterality: N/A;  . ESOPHAGEAL DILATION N/A 09/23/2017   Procedure: ESOPHAGEAL DILATION;  Surgeon: Danie Binder, MD;  [facility];  Service: Endoscopy;  Laterality: N/A;  . ESOPHAGEAL MANOMETRY N/A 01/10/2018   Procedure: ESOPHAGEAL MANOMETRY (EM);  Surgeon: Danie Binder, MD;  Location: [facility];  Service: Endoscopy;  Laterality: N/A;  . ESOPHAGOGASTRODUODENOSCOPY  02/11/2012   Dr. Gala Romney: portal gastropathy, gastric erosions, esophageal ulcerations likely pill-induced, surveillance in 2 years  . ESOPHAGOGASTRODUODENOSCOPY N/A 12/01/2014   Dr. Volney American esophageal stricture dilated with the scope passage, portal gastropathy, negative H.pylori on gastric biopsies, esophageal biopsies benign  . ESOPHAGOGASTRODUODENOSCOPY N/A 02/07/2015   Procedure: ESOPHAGOGASTRODUODENOSCOPY (EGD);  Surgeon: Daneil Dolin, MD;  [facility];  Service: Endoscopy;  Laterality: N/A;  1115  . ESOPHAGOGASTRODUODENOSCOPY N/A 08/17/2017   benign-appearing esophageal stenosis s/p dilation, mild gastritis, pylorus stenosis s/p dilation  . ESOPHAGOGASTRODUODENOSCOPY N/A 09/23/2017   moderate benign-appearing instrinsic stenosis s/p dilation, mild gastritis  . ESOPHAGOGASTRODUODENOSCOPY N/A 10/18/2017   Benign-appearing esophageal stricture s/p dilation, gastritis, benign-appearing intrinsice moderate pylorus stenosis s/p dilation  . ESOPHAGOGASTRODUODENOSCOPY (EGD) WITH PROPOFOL N/A 12/23/2017   Dr. Gala Romney: single short Grade 2  varix mid esophagus, just above GE junction was nearly circumferential denuding ulcerations/loss of normal mucosal appearance (somewhat punched out) mucosa. Query pill induced injury with path negative for malignancy. Portal hypertensive gastropathy, patent pylorus, normal duodenal bulb and second portion of duodenum, no dilation performed.   Marland Kitchen EYE SURGERY     RIGHT EYE REMOVED  . JOINT REPLACEMENT    . LEFT HEART CATH AND CORONARY ANGIOGRAPHY N/A 05/01/2017   Procedure: Left Heart Cath and Coronary Angiography;  Surgeon: Leonie Man, MD;  Location: [place] [facility];  Service: Cardiovascular;  Laterality: N/A;  . Venia Minks DILATION N/A 10/18/2017   Procedure: Venia Minks DILATION;  Surgeon: Danie Binder, MD;  [facility];  Service: Endoscopy;  Laterality: N/A;  . s/p eye implant  1997   artificial eye, right  . SAVORY DILATION N/A 10/18/2017   Procedure: SAVORY DILATION;  Surgeon: Danie Binder, MD;  [facility];  Service: Endoscopy;  Laterality: N/A;  . TOTAL HIP ARTHROPLASTY  2002  . TOTAL HIP ARTHROPLASTY  2006/2012   revision in 2012  . TOTAL KNEE ARTHROPLASTY  1999/2003   left/right    Current Outpatient Medications  Medication Sig Dispense Refill  . allopurinol (ZYLOPRIM) 100 MG tablet Take 1 tablet (100 mg total) by mouth daily. (Patient taking differently: Take 300 mg by mouth daily. ) 30 tablet 1  .  aspirin 81 MG chewable tablet Chew 81 mg by mouth daily.    Marland Kitchen atorvastatin (LIPITOR) 80 MG tablet Take 80 mg by mouth daily at 6 PM.     . calcium carbonate (ANTACID EXTRA STRENGTH) 750 MG chewable tablet Chew 1 tablet by mouth as needed for heartburn.    . carvedilol (COREG) 3.125 MG tablet Take 1 tablet by mouth 2 (two) times daily.    . Cholecalciferol (VITAMIN D3) 2000 units CHEW Chew by mouth daily.    . clopidogrel (PLAVIX) 75 MG tablet Take 1 tablet (75 mg total) by mouth daily.    Marland Kitchen diltiazem (CARDIZEM CD) 120 MG 24 hr capsule Take 1 capsule (120  mg total) by mouth daily. 30 capsule 0  . Ergocalciferol (VITAMIN D2) 400 units TABS Take 1 tablet by mouth 2 (two) times daily.    . folic acid (FOLVITE) 1 MG tablet Take 1 tablet (1 mg total) by mouth daily.    Marland Kitchen levothyroxine (SYNTHROID, LEVOTHROID) 50 MCG tablet Take 50 mcg by mouth daily before breakfast.     . metoCLOPramide (REGLAN) 5 MG tablet 1 po 30 minutes prior to meals bid (Patient taking differently: Take 5 mg by mouth 2 (two) times daily before a meal. 1 po 30 minutes prior to meals bid) 60 tablet 11  . omeprazole (PRILOSEC) 40 MG capsule Take 1 capsule (40 mg total) by mouth 2 (two) times daily. (Patient taking differently: Take 20 mg by mouth 2 (two) times daily. ) 60 capsule 5  . oxazepam (SERAX) 10 MG capsule Take 1 capsule by mouth at bedtime as needed.    . polyethylene glycol (MIRALAX / GLYCOLAX) packet Take 17 g daily by mouth. 14 each 0  . torsemide (DEMADEX) 20 MG tablet Take 1 tablet by mouth daily.    . traMADol (ULTRAM) 50 MG tablet Take 1 tablet (50 mg total) by mouth every 6 (six) hours as needed. 6 tablet 0  . vitamin C (ASCORBIC ACID) 500 MG tablet Take 250 mg by mouth daily.      No current facility-administered medications for this visit.     Allergies as of 12/17/2018  . (No Known Allergies)    Family History  Problem Relation Age of Onset  . Ovarian cancer Sister   . Colon cancer Neg Hx     Social History   Socioeconomic History  . Marital status: Divorced    Spouse name: Not on file  . Number of children: Not on file  . Years of education: Not on file  . Highest education level: Not on file  Occupational History  . Not on file  Social Needs  . Financial resource strain: Not on file  . Food insecurity:    Worry: Not on file    Inability: Not on file  . Transportation needs:    Medical: Not on file    Non-medical: Not on file  Tobacco Use  . Smoking status: Never Smoker  . Smokeless tobacco: Never Used  . Tobacco comment: used to chew  tobacco, none in 15 years  Substance and Sexual Activity  . Alcohol use: Not Currently    Alcohol/week: 0.0 standard drinks    Frequency: Never    Comment: occ beer  . Drug use: No  . Sexual activity: Not Currently  Lifestyle  . Physical activity:    Days per week: Not on file    Minutes per session: Not on file  . Stress: Not on file  Relationships  .  Social connections:    Talks on phone: Not on file    Gets together: Not on file    Attends religious service: Not on file    Active member of club or organization: Not on file    Attends meetings of clubs or organizations: Not on file    Relationship status: Not on file  Other Topics Concern  . Not on file  Social History Narrative   One son deceased while in prison, drug addiction.    Review of Systems: General: Negative for anorexia, weight loss, fever, chills, fatigue, weakness. ENT: Negative for hoarseness, difficulty swallowing. CV: Negative for chest pain, angina, palpitations, peripheral edema.  Respiratory: Negative for dyspnea at rest, cough, sputum, wheezing.  GI: See history of present illness. Endo: Negative for unusual weight change.  Heme: Negative for bruising or bleeding. Allergy: Negative for rash or hives.   Physical Exam: BP 90/67   Pulse 94   Temp (!) 97.1 F (36.2 C) (Oral)   Ht 5' 8"  (1.727 m)   BMI 37.71 kg/m  General:   Alert and oriented. Pleasant and cooperative. Well-nourished and well-developed.  Eyes:  Without icterus, sclera clear and conjunctiva pink.  Ears:  Normal auditory acuity. Mouth:  No deformity or lesions, oral mucosa pink.  Throat/Neck:  Supple, without mass or thyromegaly. Cardiovascular:  S1, S2 present without murmurs appreciated  Extremities without clubbing or edema. Respiratory:  Clear to auscultation bilaterally. No wheezes, rales, or rhonchi. No distress.  Gastrointestinal:  +BS, soft, non-tender and non-distended. No HSM noted. No guarding or rebound. No masses  appreciated.  Rectal:  Deferred  Musculoskalatal:  Symmetrical without gross deformities. Skin: LUE skin tear with dressing in place. During the time in our office, no significant bleeding through the dressing. Neurologic:  Alert and oriented x4;  grossly normal neurologically. Psych:  Alert and cooperative. Normal mood and affect. Heme/Lymph/Immune: No excessive bruising noted.    01/01/2019 2:53 PM   Disclaimer: This note was dictated with voice recognition software. Similar sounding words can inadvertently be transcribed and may not be corrected upon review.

## 2018-12-17 NOTE — Assessment & Plan Note (Signed)
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  He was referred to [facility] for query pneumatic dilation or other treatment.  They elected to hold off at that time.  He is not having any dysphasia symptoms at this time.  Given that his symptoms are absent we will not pursue further work-up or treatment until such time as it is needed.  Otherwise follow-up in 6 months.

## 2018-12-17 NOTE — Patient Instructions (Signed)
Your health issues we discussed today were:   Heartburn (GERD): 1. Continue taking omeprazole (Prilosec) twice daily and Tums as needed. 2. Call us if you have any worsening symptoms  Previous issues with persistent vomiting: 1. It seems her symptoms are doing better 2. Call us if you have any worsening vomiting or issues with food getting stuck  Overall I recommend:  1. Follow-up in 6 months 2. Call us if you have any questions or concerns  At [facility] we value your feedback. You may receive a survey about your visit today. Please share your experience as we strive to create trusting relationships with our patients to provide genuine, compassionate, quality care.  We appreciate your understanding and patience as we review any laboratory studies, imaging, and other diagnostic tests that are ordered as we care for you. Our office policy is 5 business days for review of these results, and any emergent or urgent results are addressed in a timely manner for your best interest. If you do not hear from our office in 1 week, please contact us.   We also encourage the use of MyChart, which contains your medical information for your review as well. If you are not enrolled in this feature, an access code is on this after visit summary for your convenience. Thank you for allowing [place] to be involved in your care.  It was great to see you today!  I hope you have a great day!!

## 2018-12-17 NOTE — Progress Notes (Signed)
CC'D TO PCP °

## 2018-12-17 NOTE — Assessment & Plan Note (Signed)
The patient is chronic GERD.  We tried other medications including Dexilant and pantoprazole to improve his symptoms.  Dexilant was too expensive and pantoprazole was less effective than omeprazole.  He subsequently switched back to omeprazole and takes this as well as Tums as needed.  At this point we will leave him on his current regimen.  Recommend he continue taking medications, notify us of any worsening symptoms.  Follow-up in 6 months.

## 2018-12-26 DIAGNOSIS — N185 Chronic kidney disease, stage 5: Secondary | ICD-10-CM | POA: Diagnosis not present

## 2018-12-26 DIAGNOSIS — Z299 Encounter for prophylactic measures, unspecified: Secondary | ICD-10-CM | POA: Diagnosis not present

## 2018-12-26 DIAGNOSIS — I4891 Unspecified atrial fibrillation: Secondary | ICD-10-CM | POA: Diagnosis not present

## 2018-12-26 DIAGNOSIS — K219 Gastro-esophageal reflux disease without esophagitis: Secondary | ICD-10-CM | POA: Diagnosis not present

## 2018-12-26 DIAGNOSIS — I10 Essential (primary) hypertension: Secondary | ICD-10-CM | POA: Diagnosis not present

## 2018-12-26 DIAGNOSIS — Z6834 Body mass index (BMI) 34.0-34.9, adult: Secondary | ICD-10-CM | POA: Diagnosis not present

## 2018-12-26 DIAGNOSIS — I509 Heart failure, unspecified: Secondary | ICD-10-CM | POA: Diagnosis not present

## 2018-12-26 DIAGNOSIS — Z789 Other specified health status: Secondary | ICD-10-CM | POA: Diagnosis not present

## 2018-12-27 ENCOUNTER — Other Ambulatory Visit: Payer: Self-pay | Admitting: Nurse Practitioner

## 2018-12-27 DIAGNOSIS — K219 Gastro-esophageal reflux disease without esophagitis: Secondary | ICD-10-CM

## 2018-12-27 DIAGNOSIS — R1314 Dysphagia, pharyngoesophageal phase: Secondary | ICD-10-CM

## 2018-12-27 DIAGNOSIS — R112 Nausea with vomiting, unspecified: Secondary | ICD-10-CM

## 2018-12-30 DIAGNOSIS — M159 Polyosteoarthritis, unspecified: Secondary | ICD-10-CM | POA: Diagnosis not present

## 2018-12-30 DIAGNOSIS — I509 Heart failure, unspecified: Secondary | ICD-10-CM | POA: Diagnosis not present

## 2019-01-01 ENCOUNTER — Telehealth: Payer: Self-pay | Admitting: Internal Medicine

## 2019-01-01 ENCOUNTER — Other Ambulatory Visit: Payer: Self-pay | Admitting: Nurse Practitioner

## 2019-01-01 DIAGNOSIS — K219 Gastro-esophageal reflux disease without esophagitis: Secondary | ICD-10-CM

## 2019-01-01 DIAGNOSIS — R112 Nausea with vomiting, unspecified: Secondary | ICD-10-CM

## 2019-01-01 DIAGNOSIS — R1314 Dysphagia, pharyngoesophageal phase: Secondary | ICD-10-CM

## 2019-01-01 MED ORDER — OMEPRAZOLE 20 MG PO CPDR: 20.0000 mg | DELAYED_RELEASE_CAPSULE | Freq: Two times a day (BID) | ORAL | 3 refills | Status: DC

## 2019-01-01 NOTE — Telephone Encounter (Signed)
Noted  

## 2019-01-01 NOTE — Telephone Encounter (Signed)
Per the last paragraph in my HPI, Dexilant was too expensive and Protonix didn't work so he was back to Omeprazole. That's where I got the AVS recommendation to continue Omeprazole.  The medication list was correct, but for some reason hadn't been refreshed into the OV note after it was reviewed.

## 2019-01-01 NOTE — Telephone Encounter (Signed)
Pt left a VM that he needed a refill on his stomach pills and he was completely out. I forwarded message to AM.

## 2019-01-01 NOTE — Telephone Encounter (Signed)
I am confused. Pharmacy sent request for pantoprazole 21m bid. He had dexilant, pantoprazole, and omeprazole all listed under med list.   His omeprazole on med list says patient is taking 287mbid but written for 4077mid.   Please help clarify.

## 2019-01-01 NOTE — Telephone Encounter (Signed)
Spoke with pt. He couldn't remember which med he was going. The Dexilant was too expensive so he said he would go back to Pantoprazole. The ov note for 12/17/18 says continue Prilosec. Pt was rude and said if I can't call in the medication, maybe he would live to July. Send this to [facility] to confirm which medication pt is taking.

## 2019-01-01 NOTE — Telephone Encounter (Signed)
Still does not clarify what dose of omeprazole he is taking 20 or 54m BID.   I will send in RX for 258mBID

## 2019-01-01 NOTE — Addendum Note (Signed)
Addended by: Mahala Menghini on: 01/01/2019 04:28 PM   Modules accepted: Orders

## 2019-01-01 NOTE — Telephone Encounter (Signed)
Pt would like a refill of his omeprazole sent to [facility]

## 2019-01-15 DIAGNOSIS — I10 Essential (primary) hypertension: Secondary | ICD-10-CM | POA: Diagnosis not present

## 2019-01-15 DIAGNOSIS — M109 Gout, unspecified: Secondary | ICD-10-CM | POA: Diagnosis not present

## 2019-01-15 DIAGNOSIS — K219 Gastro-esophageal reflux disease without esophagitis: Secondary | ICD-10-CM | POA: Diagnosis not present

## 2019-01-15 DIAGNOSIS — Z6834 Body mass index (BMI) 34.0-34.9, adult: Secondary | ICD-10-CM | POA: Diagnosis not present

## 2019-01-15 DIAGNOSIS — I739 Peripheral vascular disease, unspecified: Secondary | ICD-10-CM | POA: Diagnosis not present

## 2019-01-15 DIAGNOSIS — M21612 Bunion of left foot: Secondary | ICD-10-CM | POA: Diagnosis not present

## 2019-01-15 DIAGNOSIS — Z299 Encounter for prophylactic measures, unspecified: Secondary | ICD-10-CM | POA: Diagnosis not present

## 2019-01-22 DIAGNOSIS — M25579 Pain in unspecified ankle and joints of unspecified foot: Secondary | ICD-10-CM | POA: Diagnosis not present

## 2019-01-22 DIAGNOSIS — I739 Peripheral vascular disease, unspecified: Secondary | ICD-10-CM | POA: Diagnosis not present

## 2019-01-22 DIAGNOSIS — M79672 Pain in left foot: Secondary | ICD-10-CM | POA: Diagnosis not present

## 2019-01-22 DIAGNOSIS — L110 Acquired keratosis follicularis: Secondary | ICD-10-CM | POA: Diagnosis not present

## 2019-01-22 DIAGNOSIS — M79671 Pain in right foot: Secondary | ICD-10-CM | POA: Diagnosis not present

## 2019-01-23 ENCOUNTER — Other Ambulatory Visit: Payer: Self-pay

## 2019-01-23 ENCOUNTER — Other Ambulatory Visit (HOSPITAL_COMMUNITY)
Admission: RE | Admit: 2019-01-23 | Discharge: 2019-01-23 | Disposition: A | Discharge status: Home / Self Care | Payer: Medicare Other | Source: Ambulatory Visit | Attending: Nephrology | Admitting: Nephrology

## 2019-01-23 DIAGNOSIS — E559 Vitamin D deficiency, unspecified: Secondary | ICD-10-CM | POA: Diagnosis not present

## 2019-01-23 DIAGNOSIS — D509 Iron deficiency anemia, unspecified: Secondary | ICD-10-CM | POA: Insufficient documentation

## 2019-01-23 DIAGNOSIS — I10 Essential (primary) hypertension: Secondary | ICD-10-CM | POA: Diagnosis not present

## 2019-01-23 DIAGNOSIS — R809 Proteinuria, unspecified: Secondary | ICD-10-CM | POA: Diagnosis not present

## 2019-01-23 DIAGNOSIS — Z79899 Other long term (current) drug therapy: Secondary | ICD-10-CM | POA: Insufficient documentation

## 2019-01-23 LAB — HEMOGLOBIN AND HEMATOCRIT, BLOOD
HCT: 44 % (ref 39.0–52.0)
Hemoglobin: 13.5 g/dL (ref 13.0–17.0)

## 2019-01-23 LAB — IRON AND TIBC
IRON: 79 ug/dL (ref 45–182)
Saturation Ratios: 25 % (ref 17.9–39.5)
TIBC: 320 ug/dL (ref 250–450)
UIBC: 241 ug/dL

## 2019-01-23 LAB — FERRITIN: FERRITIN: 62 ng/mL (ref 24–336)

## 2019-01-23 LAB — RENAL FUNCTION PANEL
ANION GAP: 12 (ref 5–15)
Albumin: 3.7 g/dL (ref 3.5–5.0)
BUN: 30 mg/dL — AB (ref 8–23)
CO2: 29 mmol/L (ref 22–32)
Calcium: 9.2 mg/dL (ref 8.9–10.3)
Chloride: 101 mmol/L (ref 98–111)
Creatinine, Ser: 2.03 mg/dL — ABNORMAL HIGH (ref 0.61–1.24)
GFR calc Af Amer: 35 mL/min — ABNORMAL LOW (ref >60)
GFR calc non Af Amer: 30 mL/min — ABNORMAL LOW (ref >60)
GLUCOSE: 97 mg/dL (ref 70–99)
POTASSIUM: 3.3 mmol/L — AB (ref 3.5–5.1)
Phosphorus: 2.9 mg/dL (ref 2.5–4.6)
Sodium: 142 mmol/L (ref 135–145)

## 2019-01-24 LAB — VITAMIN D 25 HYDROXY (VIT D DEFICIENCY, FRACTURES): Vit D, 25-Hydroxy: 71.5 ng/mL (ref 30.0–100.0)

## 2019-01-24 LAB — PARATHYROID HORMONE, INTACT (NO CA): PTH: 42 pg/mL (ref 15–65)

## 2019-01-25 DIAGNOSIS — I4891 Unspecified atrial fibrillation: Secondary | ICD-10-CM | POA: Diagnosis not present

## 2019-01-25 DIAGNOSIS — K5900 Constipation, unspecified: Secondary | ICD-10-CM | POA: Diagnosis not present

## 2019-01-25 DIAGNOSIS — Z79899 Other long term (current) drug therapy: Secondary | ICD-10-CM | POA: Diagnosis not present

## 2019-01-25 DIAGNOSIS — I10 Essential (primary) hypertension: Secondary | ICD-10-CM | POA: Diagnosis not present

## 2019-01-25 DIAGNOSIS — R1111 Vomiting without nausea: Secondary | ICD-10-CM | POA: Diagnosis not present

## 2019-01-25 DIAGNOSIS — Z7982 Long term (current) use of aspirin: Secondary | ICD-10-CM | POA: Diagnosis not present

## 2019-01-25 DIAGNOSIS — K5641 Fecal impaction: Secondary | ICD-10-CM | POA: Diagnosis not present

## 2019-02-17 DIAGNOSIS — M159 Polyosteoarthritis, unspecified: Secondary | ICD-10-CM | POA: Diagnosis not present

## 2019-02-17 DIAGNOSIS — I509 Heart failure, unspecified: Secondary | ICD-10-CM | POA: Diagnosis not present

## 2019-02-25 DIAGNOSIS — I739 Peripheral vascular disease, unspecified: Secondary | ICD-10-CM | POA: Diagnosis not present

## 2019-02-25 DIAGNOSIS — I509 Heart failure, unspecified: Secondary | ICD-10-CM | POA: Diagnosis not present

## 2019-02-25 DIAGNOSIS — M1990 Unspecified osteoarthritis, unspecified site: Secondary | ICD-10-CM | POA: Diagnosis not present

## 2019-02-25 DIAGNOSIS — Z6834 Body mass index (BMI) 34.0-34.9, adult: Secondary | ICD-10-CM | POA: Diagnosis not present

## 2019-02-25 DIAGNOSIS — Z299 Encounter for prophylactic measures, unspecified: Secondary | ICD-10-CM | POA: Diagnosis not present

## 2019-02-25 DIAGNOSIS — K5900 Constipation, unspecified: Secondary | ICD-10-CM | POA: Diagnosis not present

## 2019-03-18 DIAGNOSIS — I509 Heart failure, unspecified: Secondary | ICD-10-CM | POA: Diagnosis not present

## 2019-03-18 DIAGNOSIS — M159 Polyosteoarthritis, unspecified: Secondary | ICD-10-CM | POA: Diagnosis not present

## 2019-04-22 DIAGNOSIS — N185 Chronic kidney disease, stage 5: Secondary | ICD-10-CM | POA: Diagnosis not present

## 2019-04-22 DIAGNOSIS — I10 Essential (primary) hypertension: Secondary | ICD-10-CM | POA: Diagnosis not present

## 2019-04-22 DIAGNOSIS — I739 Peripheral vascular disease, unspecified: Secondary | ICD-10-CM | POA: Diagnosis not present

## 2019-04-22 DIAGNOSIS — Z299 Encounter for prophylactic measures, unspecified: Secondary | ICD-10-CM | POA: Diagnosis not present

## 2019-04-22 DIAGNOSIS — M1990 Unspecified osteoarthritis, unspecified site: Secondary | ICD-10-CM | POA: Diagnosis not present

## 2019-04-22 DIAGNOSIS — Z6834 Body mass index (BMI) 34.0-34.9, adult: Secondary | ICD-10-CM | POA: Diagnosis not present

## 2019-05-27 DIAGNOSIS — M159 Polyosteoarthritis, unspecified: Secondary | ICD-10-CM | POA: Diagnosis not present

## 2019-05-27 DIAGNOSIS — I509 Heart failure, unspecified: Secondary | ICD-10-CM | POA: Diagnosis not present

## 2019-06-17 ENCOUNTER — Ambulatory Visit: Payer: Medicare Other | Admitting: Nurse Practitioner

## 2019-07-02 DIAGNOSIS — M1990 Unspecified osteoarthritis, unspecified site: Secondary | ICD-10-CM | POA: Diagnosis not present

## 2019-07-02 DIAGNOSIS — I509 Heart failure, unspecified: Secondary | ICD-10-CM | POA: Diagnosis not present

## 2019-07-02 DIAGNOSIS — Z6834 Body mass index (BMI) 34.0-34.9, adult: Secondary | ICD-10-CM | POA: Diagnosis not present

## 2019-07-02 DIAGNOSIS — Z299 Encounter for prophylactic measures, unspecified: Secondary | ICD-10-CM | POA: Diagnosis not present

## 2019-07-02 DIAGNOSIS — I10 Essential (primary) hypertension: Secondary | ICD-10-CM | POA: Diagnosis not present

## 2019-07-02 DIAGNOSIS — I4891 Unspecified atrial fibrillation: Secondary | ICD-10-CM | POA: Diagnosis not present

## 2019-07-03 DIAGNOSIS — M159 Polyosteoarthritis, unspecified: Secondary | ICD-10-CM | POA: Diagnosis not present

## 2019-07-03 DIAGNOSIS — I509 Heart failure, unspecified: Secondary | ICD-10-CM | POA: Diagnosis not present

## 2019-08-11 DIAGNOSIS — I509 Heart failure, unspecified: Secondary | ICD-10-CM | POA: Diagnosis not present

## 2019-08-11 DIAGNOSIS — M159 Polyosteoarthritis, unspecified: Secondary | ICD-10-CM | POA: Diagnosis not present

## 2019-08-20 DIAGNOSIS — E039 Hypothyroidism, unspecified: Secondary | ICD-10-CM | POA: Diagnosis not present

## 2019-08-20 DIAGNOSIS — Z299 Encounter for prophylactic measures, unspecified: Secondary | ICD-10-CM | POA: Diagnosis not present

## 2019-08-20 DIAGNOSIS — R5383 Other fatigue: Secondary | ICD-10-CM | POA: Diagnosis not present

## 2019-08-20 DIAGNOSIS — Z1211 Encounter for screening for malignant neoplasm of colon: Secondary | ICD-10-CM | POA: Diagnosis not present

## 2019-08-20 DIAGNOSIS — Z79899 Other long term (current) drug therapy: Secondary | ICD-10-CM | POA: Diagnosis not present

## 2019-08-20 DIAGNOSIS — Z125 Encounter for screening for malignant neoplasm of prostate: Secondary | ICD-10-CM | POA: Diagnosis not present

## 2019-08-20 DIAGNOSIS — I4891 Unspecified atrial fibrillation: Secondary | ICD-10-CM | POA: Diagnosis not present

## 2019-08-20 DIAGNOSIS — Z7189 Other specified counseling: Secondary | ICD-10-CM | POA: Diagnosis not present

## 2019-08-20 DIAGNOSIS — Z1339 Encounter for screening examination for other mental health and behavioral disorders: Secondary | ICD-10-CM | POA: Diagnosis not present

## 2019-08-20 DIAGNOSIS — Z1331 Encounter for screening for depression: Secondary | ICD-10-CM | POA: Diagnosis not present

## 2019-08-20 DIAGNOSIS — Z0000 Encounter for general adult medical examination without abnormal findings: Secondary | ICD-10-CM | POA: Diagnosis not present

## 2019-08-20 DIAGNOSIS — Z6834 Body mass index (BMI) 34.0-34.9, adult: Secondary | ICD-10-CM | POA: Diagnosis not present

## 2019-08-20 DIAGNOSIS — Z23 Encounter for immunization: Secondary | ICD-10-CM | POA: Diagnosis not present

## 2019-08-20 DIAGNOSIS — E7800 Pure hypercholesterolemia, unspecified: Secondary | ICD-10-CM | POA: Diagnosis not present

## 2019-09-07 DIAGNOSIS — M159 Polyosteoarthritis, unspecified: Secondary | ICD-10-CM | POA: Diagnosis not present

## 2019-09-07 DIAGNOSIS — I509 Heart failure, unspecified: Secondary | ICD-10-CM | POA: Diagnosis not present

## 2019-10-29 DIAGNOSIS — Z7982 Long term (current) use of aspirin: Secondary | ICD-10-CM | POA: Diagnosis not present

## 2019-10-29 DIAGNOSIS — I4891 Unspecified atrial fibrillation: Secondary | ICD-10-CM | POA: Diagnosis not present

## 2019-10-29 DIAGNOSIS — Z79899 Other long term (current) drug therapy: Secondary | ICD-10-CM | POA: Diagnosis not present

## 2019-10-29 DIAGNOSIS — R1030 Lower abdominal pain, unspecified: Secondary | ICD-10-CM | POA: Diagnosis not present

## 2019-10-29 DIAGNOSIS — K5900 Constipation, unspecified: Secondary | ICD-10-CM | POA: Diagnosis not present

## 2019-10-29 DIAGNOSIS — I10 Essential (primary) hypertension: Secondary | ICD-10-CM | POA: Diagnosis not present

## 2019-10-29 DIAGNOSIS — Z955 Presence of coronary angioplasty implant and graft: Secondary | ICD-10-CM | POA: Diagnosis not present

## 2019-10-29 DIAGNOSIS — M1990 Unspecified osteoarthritis, unspecified site: Secondary | ICD-10-CM | POA: Diagnosis not present

## 2019-11-11 ENCOUNTER — Emergency Department (HOSPITAL_COMMUNITY)
Admission: EM | Admit: 2019-11-11 | Discharge: 2019-11-11 | Disposition: A | Discharge status: Home / Self Care | Payer: Medicare Other | Attending: Emergency Medicine | Admitting: Emergency Medicine

## 2019-11-11 ENCOUNTER — Emergency Department (HOSPITAL_COMMUNITY): Payer: Medicare Other

## 2019-11-11 ENCOUNTER — Other Ambulatory Visit: Payer: Self-pay

## 2019-11-11 ENCOUNTER — Encounter (HOSPITAL_COMMUNITY): Payer: Self-pay | Admitting: Emergency Medicine

## 2019-11-11 DIAGNOSIS — I130 Hypertensive heart and chronic kidney disease with heart failure and stage 1 through stage 4 chronic kidney disease, or unspecified chronic kidney disease: Secondary | ICD-10-CM | POA: Insufficient documentation

## 2019-11-11 DIAGNOSIS — R918 Other nonspecific abnormal finding of lung field: Secondary | ICD-10-CM | POA: Insufficient documentation

## 2019-11-11 DIAGNOSIS — E039 Hypothyroidism, unspecified: Secondary | ICD-10-CM | POA: Diagnosis not present

## 2019-11-11 DIAGNOSIS — Z7982 Long term (current) use of aspirin: Secondary | ICD-10-CM | POA: Diagnosis not present

## 2019-11-11 DIAGNOSIS — I4891 Unspecified atrial fibrillation: Secondary | ICD-10-CM | POA: Insufficient documentation

## 2019-11-11 DIAGNOSIS — N183 Chronic kidney disease, stage 3 unspecified: Secondary | ICD-10-CM | POA: Insufficient documentation

## 2019-11-11 DIAGNOSIS — K5901 Slow transit constipation: Secondary | ICD-10-CM | POA: Diagnosis not present

## 2019-11-11 DIAGNOSIS — Z79899 Other long term (current) drug therapy: Secondary | ICD-10-CM | POA: Diagnosis not present

## 2019-11-11 DIAGNOSIS — Z7901 Long term (current) use of anticoagulants: Secondary | ICD-10-CM | POA: Insufficient documentation

## 2019-11-11 DIAGNOSIS — I2510 Atherosclerotic heart disease of native coronary artery without angina pectoris: Secondary | ICD-10-CM | POA: Insufficient documentation

## 2019-11-11 DIAGNOSIS — I5031 Acute diastolic (congestive) heart failure: Secondary | ICD-10-CM | POA: Insufficient documentation

## 2019-11-11 DIAGNOSIS — K5900 Constipation, unspecified: Secondary | ICD-10-CM | POA: Diagnosis present

## 2019-11-11 MED ORDER — DOXYCYCLINE HYCLATE 100 MG PO CAPS: 100.0000 mg | ORAL_CAPSULE | Freq: Two times a day (BID) | ORAL | 0 refills | Status: DC

## 2019-11-11 MED ORDER — DOXYCYCLINE HYCLATE 100 MG PO TABS
100.0000 mg | ORAL_TABLET | Freq: Once | ORAL | Status: AC
  Administered 2019-11-11: 100 mg via ORAL
  Filled 2019-11-11: qty 1

## 2019-11-11 NOTE — Discharge Instructions (Signed)
Please take the following medications over the next week  For the next 10 days take doxycycline, 1 tablet twice a day to treat for the possible pneumonia.  You must have a chest x-ray in 2 weeks to make sure this is getting better and if you develop increasing chest pain shortness of breath or fever come back to the emergency department immediately.  Call your doctor the morning to set this appointment up and to have them obtain your results to review with you.  For the constipation I want you to take the following medications exactly as prescribed  Obtain 1 bottle of magnesium citrate and drink tonight Obtain a bottle of MiraLAX and take 1 scoop into a cup of water twice daily until you are having regular soft stools Continue using Colace twice a day as a stool softener If you are not having any improvement with the above medications you may need to use enemas up to 1 or 2 times per day   Please follow-up with a gastroenterologist of your choice or the doctor listed above if you do not have a gastroenterologist to discuss further treatment for your chronic constipation.

## 2019-11-11 NOTE — ED Provider Notes (Addendum)
[facility] EMERGENCY DEPARTMENT Provider Note   CSN: [id number] Arrival date & time: 11/11/19  1148     History Chief Complaint  Patient presents with  . Constipation    Justin Parsons is a 78 y.o. male.  HPI   This patient is a 78 year old male with a known history of atrial fibrillation, history of cirrhosis and a history of chronic kidney disease who also has a history of what he reports to be constipation.  This is confirmed in the medical record showing emergency department visits in the past for fecal impaction and constipation.  He reports that approximately 2 or 3 weeks ago he was seen at another hospital where he was given Colace to treat his constipation.  He reports that he has been taking both that and other things and has not had any bowel movements in the last 2 or 3 weeks.  He is very vague on his timing however states that since being to the other emergency department he has not had much success.  He denies vomiting but states he is occasionally nauseated.  He has been eating and yesterday was able to eat a bologna sandwich, a tomato and pumpkin pie.  He has not had anything to eat today.  There is no fevers, chills, chest pain, coughing, shortness of breath and he has no back pain and frankly says his abdomen does not hurt either.  He has the feeling of needing to use the bathroom but even when he strains he is not able to pass any stool.  Past Medical History:  Diagnosis Date  . Adenomatous polyp of colon   . Alcoholism ([place])    [place]  . Arthritis   . Atrial fibrillation ([place])   . Blindness of right eye 1997  . CAD (coronary artery disease)    Multivessel disease at cardiac catheterization June 2018, DES to LAD June 2018 with significant residual disease including diagonal and obtuse marginal vessels as well as RCA  . Cirrhosis of liver (HCC)    Associated portal gastropathy  . CKD (chronic kidney disease) stage 3, GFR 30-59 ml/min   . Essential  hypertension   . Hiatal hernia   . Hypothyroidism   . 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 TIA (transient ischemic attack)     Patient Active Problem List   Diagnosis Date Noted  . Generalized weakness   . Diarrhea 02/14/2018  . Acute gouty arthritis 01/15/2018  . Pressure injury of skin 01/12/2018  . Gastritis due to nonsteroidal anti-inflammatory drug   . 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 Intractable vomiting 09/20/2017  . Atrial fibrillation with RVR ([place]) 09/20/2017  . Atrial fibrillation, chronic ([place]) 08/18/2017  . Gastric outlet obstruction   . AKI (acute kidney injury) ([place])   . Nausea with vomiting 08/15/2017  . Hypotension 08/15/2017  . Dehydration 08/15/2017  . Intractable vomiting with nausea 08/15/2017  . Transaminasemia 08/15/2017  . AF (paroxysmal atrial fibrillation) ([place]) 08/15/2017  . 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 Abnormal liver enzymes   . Coronary artery disease involving native coronary artery of native heart with angina pectoris ([place])   . Tobacco abuse   . Acute renal failure superimposed on stage 3 chronic kidney disease ([place])   . Alcohol use disorder   . Non-ST elevation (NSTEMI) myocardial infarction ([place]) 04/29/2017  . Delirium tremens ([place])   . Shock circulatory ([place])   . Confusion   . Acute encephalopathy 04/27/2017  .  Schatzki's ring   . Esophageal varices ([place])   . History of esophageal stricture 01/20/2015  . Esophageal stricture   . History of colonic polyps   . Dysphagia, pharyngoesophageal phase 11/04/2014  . Suicidal ideation 11/17/2013  . Acute renal failure ([place]) 11/17/2013  . Acute diastolic CHF (congestive heart failure) ([place]) 11/13/2013  . Acute respiratory failure ([place]) 11/13/2013  . Community acquired pneumonia 11/12/2013  . A-fib ([place]) 11/12/2013  . CKD (chronic kidney disease), stage III 11/12/2013  . Hypothyroidism 11/12/2013  . Dyspnea 11/12/2013  . Thrombocytopenia ([place])  06/05/2012  . ARF (acute renal failure) ([place]) 06/04/2012  . Obesity 06/04/2012  . Cirrhosis ([place]) 01/29/2012  . Adenomatous polyp 01/29/2012  . Renal insufficiency 05/01/2011  . Anemia 05/01/2011  . Rectal bleed 05/01/2011  . GERD (gastroesophageal reflux disease) 05/01/2011    Past Surgical History:  Procedure Laterality Date  . BIOPSY  12/23/2017   Procedure: BIOPSY;  Surgeon: Daneil Dolin, MD;  [facility];  Service: Endoscopy;;  esophagus  . CARDIAC CATHETERIZATION  2003  . COLONOSCOPY  08/2008   normal, repeat exam in 5-7 years  . COLONOSCOPY  2004   rectal adenomatous polyp  . COLONOSCOPY N/A 12/01/2014   PJA:[id number] rectum/elongated colon  . CORONARY ATHERECTOMY N/A 05/03/2017   Procedure: Coronary Atherectomy;  Surgeon: Leonie Man, MD;  Location: [place] [facility];  Service: Cardiovascular;  Laterality: N/A;  . CORONARY STENT INTERVENTION N/A 05/03/2017   Procedure: Coronary Stent Intervention;  Surgeon: Leonie Man, MD;  Location: [place] [facility];  Service: Cardiovascular;  Laterality: N/A;  . ESOPHAGEAL DILATION N/A 02/07/2015   Procedure: ESOPHAGEAL DILATION;  Surgeon: Daneil Dolin, MD;  [facility];  Service: Endoscopy;  Laterality: N/A;  . ESOPHAGEAL DILATION N/A 08/17/2017   Procedure: ESOPHAGEAL DILATION;  Surgeon: Danie Binder, MD;  [facility];  Service: Endoscopy;  Laterality: N/A;  . ESOPHAGEAL DILATION N/A 09/23/2017   Procedure: ESOPHAGEAL DILATION;  Surgeon: Danie Binder, MD;  [facility];  Service: Endoscopy;  Laterality: N/A;  . ESOPHAGEAL MANOMETRY N/A 01/10/2018   Procedure: ESOPHAGEAL MANOMETRY (EM);  Surgeon: Danie Binder, MD;  Location: [facility];  Service: Endoscopy;  Laterality: N/A;  . ESOPHAGOGASTRODUODENOSCOPY  02/11/2012   Dr. Gala Romney: portal gastropathy, gastric erosions, esophageal ulcerations likely pill-induced, surveillance in 2 years  . ESOPHAGOGASTRODUODENOSCOPY N/A  12/01/2014   Dr. Volney American esophageal stricture dilated with the scope passage, portal gastropathy, negative H.pylori on gastric biopsies, esophageal biopsies benign  . ESOPHAGOGASTRODUODENOSCOPY N/A 02/07/2015   Procedure: ESOPHAGOGASTRODUODENOSCOPY (EGD);  Surgeon: Daneil Dolin, MD;  [facility];  Service: Endoscopy;  Laterality: N/A;  1115  . ESOPHAGOGASTRODUODENOSCOPY N/A 08/17/2017   benign-appearing esophageal stenosis s/p dilation, mild gastritis, pylorus stenosis s/p dilation  . ESOPHAGOGASTRODUODENOSCOPY N/A 09/23/2017   moderate benign-appearing instrinsic stenosis s/p dilation, mild gastritis  . ESOPHAGOGASTRODUODENOSCOPY N/A 10/18/2017   Benign-appearing esophageal stricture s/p dilation, gastritis, benign-appearing intrinsice moderate pylorus stenosis s/p dilation  . ESOPHAGOGASTRODUODENOSCOPY (EGD) WITH PROPOFOL N/A 12/23/2017   Dr. Gala Romney: single short Grade 2 varix mid esophagus, just above GE junction was nearly circumferential denuding ulcerations/loss of normal mucosal appearance (somewhat punched out) mucosa. Query pill induced injury with path negative for malignancy. Portal hypertensive gastropathy, patent pylorus, normal duodenal bulb and second portion of duodenum, no dilation performed.   Marland Kitchen EYE SURGERY     RIGHT EYE REMOVED  . JOINT REPLACEMENT    . LEFT HEART CATH  AND CORONARY ANGIOGRAPHY N/A 05/01/2017   Procedure: Left Heart Cath and Coronary Angiography;  Surgeon: Leonie Man, MD;  Location: [place] [facility];  Service: Cardiovascular;  Laterality: N/A;  . Venia Minks DILATION N/A 10/18/2017   Procedure: Venia Minks DILATION;  Surgeon: Danie Binder, MD;  [facility];  Service: Endoscopy;  Laterality: N/A;  . s/p eye implant  1997   artificial eye, right  . SAVORY DILATION N/A 10/18/2017   Procedure: SAVORY DILATION;  Surgeon: Danie Binder, MD;  [facility];  Service: Endoscopy;  Laterality: N/A;  . TOTAL HIP ARTHROPLASTY  2002    . TOTAL HIP ARTHROPLASTY  2006/2012   revision in 2012  . TOTAL KNEE ARTHROPLASTY  1999/2003   left/right       Family History  Problem Relation Age of Onset  . Ovarian cancer Sister   . Colon cancer Neg Hx     Social History   Tobacco Use  . Smoking status: Never Smoker  . Smokeless tobacco: Never Used  . Tobacco comment: used to chew tobacco, none in 15 years  Substance Use Topics  . Alcohol use: Yes    Alcohol/week: 0.0 standard drinks    Comment: occ beer  . Drug use: No    Home Medications Prior to Admission medications   Medication Sig Start Date End Date Taking? Authorizing Provider  allopurinol (ZYLOPRIM) 100 MG tablet Take 1 tablet (100 mg total) by mouth daily. Patient taking differently: Take 300 mg by mouth daily.  08/19/17   Orson Eva, MD  aspirin 81 MG chewable tablet Chew 81 mg by mouth daily.    [provider]  atorvastatin (LIPITOR) 80 MG tablet Take 80 mg by mouth daily at 6 PM.  09/12/17   [provider]  calcium carbonate (ANTACID EXTRA STRENGTH) 750 MG chewable tablet Chew 1 tablet by mouth as needed for heartburn.    [provider]  carvedilol (COREG) 3.125 MG tablet Take 1 tablet by mouth 2 (two) times daily. 12/20/17   [provider]  Cholecalciferol (VITAMIN D3) 2000 units CHEW Chew by mouth daily.    [provider]  clopidogrel (PLAVIX) 75 MG tablet Take 1 tablet (75 mg total) by mouth daily. 05/07/17   Barton Dubois, MD  diltiazem (CARDIZEM CD) 120 MG 24 hr capsule Take 1 capsule (120 mg total) by mouth daily. 02/20/18 02/20/19  Isaac Bliss, Rayford Halsted, MD  doxycycline (VIBRAMYCIN) 100 MG capsule Take 1 capsule (100 mg total) by mouth 2 (two) times daily. 11/11/19   Noemi Chapel, MD  Ergocalciferol (VITAMIN D2) 400 units TABS Take 1 tablet by mouth 2 (two) times daily.    [provider]  folic acid (FOLVITE) 1 MG tablet Take 1 tablet (1 mg total) by mouth daily. 05/07/17   Barton Dubois, MD   levothyroxine (SYNTHROID, LEVOTHROID) 50 MCG tablet Take 50 mcg by mouth daily before breakfast.  09/12/17   [provider]  metoCLOPramide (REGLAN) 5 MG tablet 1 po 30 minutes prior to meals bid Patient taking differently: Take 5 mg by mouth 2 (two) times daily before a meal. 1 po 30 minutes prior to meals bid 10/18/17   Danie Binder, MD  omeprazole (PRILOSEC) 20 MG capsule Take 1 capsule (20 mg total) by mouth 2 (two) times daily before a meal. 01/01/19   Mahala Menghini, PA-C  oxazepam (SERAX) 10 MG capsule Take 1 capsule by mouth at bedtime as needed. 08/11/18  [provider]  polyethylene glycol (MIRALAX / GLYCOLAX) packet Take 17 g daily by mouth. 09/25/17   Kathie Dike, MD  torsemide (DEMADEX) 20 MG tablet Take 1 tablet by mouth daily.    [provider]  traMADol (ULTRAM) 50 MG tablet Take 1 tablet (50 mg total) by mouth every 6 (six) hours as needed. 08/18/17   Orson Eva, MD  vitamin C (ASCORBIC ACID) 500 MG tablet Take 250 mg by mouth daily.     [provider]    Allergies    Patient has no known allergies.  Review of Systems   Review of Systems  All other systems reviewed and are negative.   Physical Exam Updated Vital Signs BP 110/74 (BP Location: Right Arm)   Pulse (!) 56   Temp 98.3 F (36.8 C) (Oral)   Resp 15   Ht 1.753 m (5' 9" )   Wt 113.4 kg   SpO2 98%   BMI 36.92 kg/m   Physical Exam Vitals and nursing note reviewed.  Constitutional:      General: He is not in acute distress.    Appearance: He is well-developed.  HENT:     Head: Normocephalic and atraumatic.     Mouth/Throat:     Pharynx: No oropharyngeal exudate.  Eyes:     General: No scleral icterus.       Right eye: No discharge.        Left eye: No discharge.     Conjunctiva/sclera: Conjunctivae normal.     Pupils: Pupils are equal, round, and reactive to light.  Neck:     Thyroid: No thyromegaly.     Vascular: No JVD.  Cardiovascular:     Rate  and Rhythm: Normal rate and regular rhythm.     Heart sounds: Murmur present. No friction rub. No gallop.      Comments: Systolic heart murmur auscultated Pulmonary:     Effort: Pulmonary effort is normal. No respiratory distress.     Breath sounds: Normal breath sounds. No wheezing or rales.  Abdominal:     General: Bowel sounds are normal. There is no distension.     Palpations: Abdomen is soft. There is no mass.     Tenderness: There is no abdominal tenderness.     Comments: Fullness in the left lower quadrant of the abdomen, no guarding or peritoneal signs, and no other abdominal tenderness to palpation including the right upper quadrant or the right lower quadrant.  Musculoskeletal:        General: No tenderness. Normal range of motion.     Cervical back: Normal range of motion and neck supple.  Lymphadenopathy:     Cervical: No cervical adenopathy.  Skin:    General: Skin is warm and dry.     Findings: No erythema or rash.  Neurological:     Mental Status: He is alert.     Coordination: Coordination normal.  Psychiatric:        Behavior: Behavior normal.     ED Results / Procedures / Treatments   Labs (all labs ordered are listed, but only abnormal results are displayed) Labs Reviewed - No data to display  EKG None  Radiology Acute Abd Series  Result Date: 11/11/2019 CLINICAL DATA:  Abdominal pain and constipation EXAM: DG ABDOMEN ACUTE W/ 1V CHEST COMPARISON:  10/29/2019.  Chest 02/16/2018 FINDINGS: Left lower lobe airspace disease and small left effusion has developed since the prior study. Small right effusion. Negative for heart failure or  edema Nonobstructive bowel gas pattern. Moderate amount of stool in the colon and rectum diffusely. No free air under the diaphragm Bilateral hip replacement. Multilevel degenerative change throughout the lumbar spine. IMPRESSION: Left lower lobe airspace disease and left effusion. Possible pneumonia or recent heart failure. Small  right effusion Retained stool in the colon without bowel obstruction. Electronically Signed   By: Franchot Gallo M.D.   On: 11/11/2019 14:12    Procedures Fecal disimpaction  Date/Time: 11/11/2019 2:47 PM Performed by: Noemi Chapel, MD Authorized by: Noemi Chapel, MD  Consent: Verbal consent obtained. Risks and benefits: risks, benefits and alternatives were discussed Consent given by: patient Patient understanding: patient states understanding of the procedure being performed Required items: required blood products, implants, devices, and special equipment available Patient identity confirmed: verbally with patient Time out: Immediately prior to procedure a "time out" was called to verify the correct patient, procedure, equipment, support staff and site/side marked as required. Preparation: Patient was prepped and draped in the usual sterile fashion. Local anesthesia used: no  Anesthesia: Local anesthesia used: no  Sedation: Patient sedated: no  Patient tolerance: patient tolerated the procedure well with no immediate complications Comments: Due to the amount of stool in the colon a disimpaction was attempted after the patient gave me verbal consent.  The patient was able to tolerate multiple attempts however only a small amount of stool was able to be removed.  He was not able to assist with Valsalva very much.  He was then given a enema by the nurse upon completion to help continue to try to move his bowels.     (including critical care time)  Medications Ordered in ED Medications  doxycycline (VIBRA-TABS) tablet 100 mg (has no administration in time range)    ED Course  I have reviewed the triage vital signs and the nursing notes.  Pertinent labs & imaging results that were available during my care of the patient were reviewed by me and considered in my medical decision making (see chart for details).  Clinical Course as of Nov 10 1524  Wed Nov 11, 2019  1418 The  x-ray shows a possible effusion with infiltrate on the left, the patient has no tachycardia fever or hypoxia.  It does show increased stool burden.  The patient will need to take an antibiotic to treat for possible community-acquired pneumonia.   [BM]    Clinical Course User Index [BM] Noemi Chapel, MD   MDM Rules/Calculators/A&P                      The patient does not appear to be in distress.  He does have a clinical history of constipation and I suspect that is what is happening here again today.  He reports that he does take a pain pill and thinks that it is some medication mixed with Tylenol.  I do see tramadol listed in his medical record.  At this time the patient will be x-rayed to look for stool burden or other causes of pain, may need fecal disimpaction.  The patient was informed of his abnormal x-ray, he states he is not coughing or feeling short of breath.  This is reassuring and suggest that this may just be atelectasis although given the appearance on x-ray will treat with doxycycline and have the patient follow-up for the x-ray to be repeated in 2 weeks, to make sure it is improving.  Prior to my departure the patient had a large bowel movement  and was able to be discharged home.  He is aware of his x-ray findings and the need for ongoing treatment of his chronic constipation Final Clinical Impression(s) / ED Diagnoses Final diagnoses:  Constipation by delayed colonic transit  Abnormal x-ray of lung    Rx / DC Orders ED Discharge Orders         Ordered    doxycycline (VIBRAMYCIN) 100 MG capsule  2 times daily     11/11/19 1450           Noemi Chapel, MD 11/11/19 1525    Noemi Chapel, MD 11/11/19 1539

## 2019-11-11 NOTE — ED Notes (Signed)
Pt with large BM  He reports no BM in several weeks  Education regarding regular BM, if none in 4 days, OTC remedies, Call to PCP, and increase fluids, roughage in diet etc   "well the doctor can't do what the emergency department can do"   Education: bowel evacuation is not generally an emergency, that there are safer options than disimpaction in EDs during pandemics

## 2019-11-11 NOTE — ED Triage Notes (Signed)
Patient reports being unable to have a bowel movement x "2-3 weeks." Has tried medications at home.

## 2019-11-11 NOTE — ED Notes (Signed)
Pt assist x 2 to dress and get into wheelchair  He reports he has a man live with him   He cannot walk, do ADLs, etc  Call to [place] [phone] without answer

## 2019-11-14 ENCOUNTER — Encounter (HOSPITAL_COMMUNITY): Payer: Self-pay | Admitting: Emergency Medicine

## 2019-11-14 ENCOUNTER — Inpatient Hospital Stay (HOSPITAL_COMMUNITY)
Admission: EM | Admit: 2019-11-14 | Discharge: 2019-11-18 | DRG: 392 | Disposition: A | Discharge status: Home / Self Care | Payer: Medicare Other | Attending: Family Medicine | Admitting: Family Medicine

## 2019-11-14 ENCOUNTER — Other Ambulatory Visit: Payer: Self-pay

## 2019-11-14 ENCOUNTER — Emergency Department (HOSPITAL_COMMUNITY): Payer: Medicare Other

## 2019-11-14 DIAGNOSIS — I493 Ventricular premature depolarization: Secondary | ICD-10-CM | POA: Diagnosis present

## 2019-11-14 DIAGNOSIS — Z96653 Presence of artificial knee joint, bilateral: Secondary | ICD-10-CM | POA: Diagnosis present

## 2019-11-14 DIAGNOSIS — Z20828 Contact with and (suspected) exposure to other viral communicable diseases: Secondary | ICD-10-CM | POA: Diagnosis present

## 2019-11-14 DIAGNOSIS — I130 Hypertensive heart and chronic kidney disease with heart failure and stage 1 through stage 4 chronic kidney disease, or unspecified chronic kidney disease: Secondary | ICD-10-CM | POA: Diagnosis present

## 2019-11-14 DIAGNOSIS — R1013 Epigastric pain: Secondary | ICD-10-CM | POA: Diagnosis not present

## 2019-11-14 DIAGNOSIS — K224 Dyskinesia of esophagus: Secondary | ICD-10-CM | POA: Diagnosis present

## 2019-11-14 DIAGNOSIS — K766 Portal hypertension: Secondary | ICD-10-CM | POA: Diagnosis present

## 2019-11-14 DIAGNOSIS — Z791 Long term (current) use of non-steroidal anti-inflammatories (NSAID): Secondary | ICD-10-CM

## 2019-11-14 DIAGNOSIS — I447 Left bundle-branch block, unspecified: Secondary | ICD-10-CM | POA: Diagnosis present

## 2019-11-14 DIAGNOSIS — K219 Gastro-esophageal reflux disease without esophagitis: Principal | ICD-10-CM | POA: Diagnosis present

## 2019-11-14 DIAGNOSIS — M1990 Unspecified osteoarthritis, unspecified site: Secondary | ICD-10-CM | POA: Diagnosis present

## 2019-11-14 DIAGNOSIS — R072 Precordial pain: Secondary | ICD-10-CM | POA: Diagnosis not present

## 2019-11-14 DIAGNOSIS — Z7902 Long term (current) use of antithrombotics/antiplatelets: Secondary | ICD-10-CM

## 2019-11-14 DIAGNOSIS — F1020 Alcohol dependence, uncomplicated: Secondary | ICD-10-CM | POA: Diagnosis present

## 2019-11-14 DIAGNOSIS — E876 Hypokalemia: Secondary | ICD-10-CM | POA: Diagnosis present

## 2019-11-14 DIAGNOSIS — E039 Hypothyroidism, unspecified: Secondary | ICD-10-CM | POA: Diagnosis present

## 2019-11-14 DIAGNOSIS — I2510 Atherosclerotic heart disease of native coronary artery without angina pectoris: Secondary | ICD-10-CM | POA: Diagnosis present

## 2019-11-14 DIAGNOSIS — R079 Chest pain, unspecified: Secondary | ICD-10-CM | POA: Diagnosis not present

## 2019-11-14 DIAGNOSIS — I252 Old myocardial infarction: Secondary | ICD-10-CM

## 2019-11-14 DIAGNOSIS — J90 Pleural effusion, not elsewhere classified: Secondary | ICD-10-CM | POA: Diagnosis present

## 2019-11-14 DIAGNOSIS — J9811 Atelectasis: Secondary | ICD-10-CM | POA: Diagnosis present

## 2019-11-14 DIAGNOSIS — Z79890 Hormone replacement therapy (postmenopausal): Secondary | ICD-10-CM

## 2019-11-14 DIAGNOSIS — K3189 Other diseases of stomach and duodenum: Secondary | ICD-10-CM | POA: Diagnosis present

## 2019-11-14 DIAGNOSIS — Z96649 Presence of unspecified artificial hip joint: Secondary | ICD-10-CM | POA: Diagnosis present

## 2019-11-14 DIAGNOSIS — Z7982 Long term (current) use of aspirin: Secondary | ICD-10-CM

## 2019-11-14 DIAGNOSIS — K7030 Alcoholic cirrhosis of liver without ascites: Secondary | ICD-10-CM | POA: Diagnosis present

## 2019-11-14 DIAGNOSIS — I083 Combined rheumatic disorders of mitral, aortic and tricuspid valves: Secondary | ICD-10-CM | POA: Diagnosis present

## 2019-11-14 DIAGNOSIS — Z86718 Personal history of other venous thrombosis and embolism: Secondary | ICD-10-CM

## 2019-11-14 DIAGNOSIS — R9431 Abnormal electrocardiogram [ECG] [EKG]: Secondary | ICD-10-CM | POA: Diagnosis present

## 2019-11-14 DIAGNOSIS — N1832 Chronic kidney disease, stage 3b: Secondary | ICD-10-CM | POA: Diagnosis present

## 2019-11-14 DIAGNOSIS — H5461 Unqualified visual loss, right eye, normal vision left eye: Secondary | ICD-10-CM | POA: Diagnosis present

## 2019-11-14 DIAGNOSIS — I5032 Chronic diastolic (congestive) heart failure: Secondary | ICD-10-CM | POA: Diagnosis present

## 2019-11-14 DIAGNOSIS — Z9001 Acquired absence of eye: Secondary | ICD-10-CM

## 2019-11-14 DIAGNOSIS — K5900 Constipation, unspecified: Secondary | ICD-10-CM | POA: Diagnosis present

## 2019-11-14 DIAGNOSIS — I482 Chronic atrial fibrillation, unspecified: Secondary | ICD-10-CM | POA: Diagnosis present

## 2019-11-14 DIAGNOSIS — Z79899 Other long term (current) drug therapy: Secondary | ICD-10-CM

## 2019-11-14 DIAGNOSIS — N183 Chronic kidney disease, stage 3 unspecified: Secondary | ICD-10-CM | POA: Diagnosis present

## 2019-11-14 DIAGNOSIS — Z86010 Personal history of colonic polyps: Secondary | ICD-10-CM

## 2019-11-14 DIAGNOSIS — K7581 Nonalcoholic steatohepatitis (NASH): Secondary | ICD-10-CM | POA: Diagnosis present

## 2019-11-14 DIAGNOSIS — Z955 Presence of coronary angioplasty implant and graft: Secondary | ICD-10-CM

## 2019-11-14 DIAGNOSIS — Z8673 Personal history of transient ischemic attack (TIA), and cerebral infarction without residual deficits: Secondary | ICD-10-CM

## 2019-11-14 LAB — HEPATIC FUNCTION PANEL
ALT: 10 U/L (ref 0–44)
AST: 16 U/L (ref 15–41)
Albumin: 3 g/dL — ABNORMAL LOW (ref 3.5–5.0)
Alkaline Phosphatase: 78 U/L (ref 38–126)
Bilirubin, Direct: 0.3 mg/dL — ABNORMAL HIGH (ref 0.0–0.2)
Indirect Bilirubin: 0.7 mg/dL (ref 0.3–0.9)
Total Bilirubin: 1 mg/dL (ref 0.3–1.2)
Total Protein: 6.4 g/dL — ABNORMAL LOW (ref 6.5–8.1)

## 2019-11-14 LAB — BASIC METABOLIC PANEL
Anion gap: 12 (ref 5–15)
BUN: 20 mg/dL (ref 8–23)
CO2: 27 mmol/L (ref 22–32)
Calcium: 8.9 mg/dL (ref 8.9–10.3)
Chloride: 100 mmol/L (ref 98–111)
Creatinine, Ser: 1.76 mg/dL — ABNORMAL HIGH (ref 0.61–1.24)
GFR calc Af Amer: 42 mL/min — ABNORMAL LOW (ref >60)
GFR calc non Af Amer: 36 mL/min — ABNORMAL LOW (ref >60)
Glucose, Bld: 105 mg/dL — ABNORMAL HIGH (ref 70–99)
Potassium: 3 mmol/L — ABNORMAL LOW (ref 3.5–5.1)
Sodium: 139 mmol/L (ref 135–145)

## 2019-11-14 LAB — CBC
HCT: 42.6 % (ref 39.0–52.0)
Hemoglobin: 13.7 g/dL (ref 13.0–17.0)
MCH: 31 pg (ref 26.0–34.0)
MCHC: 32.2 g/dL (ref 30.0–36.0)
MCV: 96.4 fL (ref 80.0–100.0)
Platelets: 175 10*3/uL (ref 150–400)
RBC: 4.42 MIL/uL (ref 4.22–5.81)
RDW: 15.2 % (ref 11.5–15.5)
WBC: 11.2 10*3/uL — ABNORMAL HIGH (ref 4.0–10.5)
nRBC: 0 % (ref 0.0–0.2)

## 2019-11-14 LAB — LIPASE, BLOOD: Lipase: 21 U/L (ref 11–51)

## 2019-11-14 LAB — MAGNESIUM: Magnesium: 1.3 mg/dL — ABNORMAL LOW (ref 1.7–2.4)

## 2019-11-14 LAB — TROPONIN I (HIGH SENSITIVITY)
Troponin I (High Sensitivity): 19 ng/L — ABNORMAL HIGH (ref <18)
Troponin I (High Sensitivity): 20 ng/L — ABNORMAL HIGH (ref <18)

## 2019-11-14 LAB — PHOSPHORUS: Phosphorus: 2.8 mg/dL (ref 2.5–4.6)

## 2019-11-14 MED ORDER — ACETAMINOPHEN 650 MG RE SUPP: 650.0000 mg | Freq: Four times a day (QID) | RECTAL | Status: DC | PRN

## 2019-11-14 MED ORDER — ONDANSETRON HCL 4 MG/2ML IJ SOLN: 4.0000 mg | Freq: Four times a day (QID) | INTRAMUSCULAR | Status: DC | PRN

## 2019-11-14 MED ORDER — MAGNESIUM SULFATE 2 GM/50ML IV SOLN
2.0000 g | Freq: Once | INTRAVENOUS | Status: AC
  Administered 2019-11-14: 2 g via INTRAVENOUS
  Filled 2019-11-14: qty 50

## 2019-11-14 MED ORDER — POTASSIUM CHLORIDE CRYS ER 20 MEQ PO TBCR
20.0000 meq | EXTENDED_RELEASE_TABLET | Freq: Once | ORAL | Status: AC
  Administered 2019-11-14: 20 meq via ORAL
  Filled 2019-11-14: qty 1

## 2019-11-14 MED ORDER — ENOXAPARIN SODIUM 40 MG/0.4ML ~~LOC~~ SOLN: 40.0000 mg | SUBCUTANEOUS | Status: DC

## 2019-11-14 MED ORDER — ENOXAPARIN SODIUM 40 MG/0.4ML ~~LOC~~ SOLN
40.0000 mg | SUBCUTANEOUS | Status: DC
  Administered 2019-11-14: 40 mg via SUBCUTANEOUS
  Filled 2019-11-14: qty 0.4

## 2019-11-14 MED ORDER — ACETAMINOPHEN 325 MG PO TABS
650.0000 mg | ORAL_TABLET | Freq: Four times a day (QID) | ORAL | Status: DC | PRN
  Administered 2019-11-17: 650 mg via ORAL
  Filled 2019-11-14: qty 2

## 2019-11-14 MED ORDER — MORPHINE SULFATE (PF) 2 MG/ML IV SOLN
2.0000 mg | Freq: Once | INTRAVENOUS | Status: AC
  Administered 2019-11-14: 2 mg via INTRAVENOUS
  Filled 2019-11-14: qty 1

## 2019-11-14 NOTE — H&P (Signed)
History and Physical    Justin Parsons JSE:[id number] DOB: 03/08/1941 DOA: 11/14/2019  PCP: Monico Blitz, MD   Patient coming from: Home.  I have personally briefly reviewed patient's old medical records in [place]  Chief Complaint: Chest pain.  HPI: Justin Parsons is a 78 y.o. male with medical history significant of adenomatous polyps, history of alcohol use, cirrhosis of the liver, history of portal vein thrombosis, osteoarthritis, chronic atrial fibrillation, right eye blindness, CAD, history of NSTEMI, CKD, essential hypertension, hiatal hernia, hypothyroidism, TIA who is coming to the emergency department due to having sharp chest pain since yesterday evening associated with some dyspnea, mild dizziness and diaphoresis.  He does not know how long it lasted.  He does not know of any relieving or worsening factors as he spends most of his time on the wheelchair.  He called EMS earlier today who gave him 4 doses of nitroglycerin with near resolution of the pain.  He denies any significant pain at this time. He has not followed up with cardiology in the past 2 years.  He also complains of having upper abdominal pain and had 2 episodes of emesis in the last few days.  He has some history describing the pain, but answers simple questions.  He denies fever, chills, dyspnea, productive cough, wheezing or hemoptysis.  He has had some loose stools, but denies melena or hematochezia.  No dysuria, frequency or hematuria.  No polyuria, polydipsia, polyphagia or blurred vision.    ED Course: Initial vital signs temperature 98.2 F, pulse 95, respirations 16, blood pressure 106/70 mmHg and O2 sat 97% on room air.  Patient received 20 mEq of potassium chloride and 2 mg of morphine IVP.  His EKG shows atrial fibrillation and is not significantly changed.  Troponin x2 have been 19 and 20 ng/L.  White count is 11.2, hemoglobin 13.7 g% platelets 175.  Hepatic function panel shows a total  protein of 6.4 and albumin 3.4 g/dL.  The rest of the panel is unremarkable.  BMP shows a potassium of 3.0 mmol/L.  Magnesium 1.3 mg/dL.  All other electrolytes are normal.  Glucose 105, BUN 20 and creatinine 1.76 mg/dL.  Chest radiograph shows small left pleural effusion with slight secondary atelectasis of the left lung base.  This is unchanged from 3 days ago.  Please see images and full radiology report for further detail.  Review of Systems: As per HPI otherwise 10 point review of systems negative.   Past Medical History:  Diagnosis Date  . Adenomatous polyp of colon   . Alcoholism ([place])    [place]  . Arthritis   . Atrial fibrillation ([place])   . Blindness of right eye 1997  . CAD (coronary artery disease)    Multivessel disease at cardiac catheterization June 2018, DES to LAD June 2018 with significant residual disease including diagonal and obtuse marginal vessels as well as RCA  . Cirrhosis of liver (HCC)    Associated portal gastropathy  . CKD (chronic kidney disease) stage 3, GFR 30-59 ml/min   . Essential hypertension   . Hiatal hernia   . Hypothyroidism   . 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 TIA (transient ischemic attack)     Past Surgical History:  Procedure Laterality Date  . BIOPSY  12/23/2017   Procedure: BIOPSY;  Surgeon: Daneil Dolin, MD;  [facility];  Service: Endoscopy;;  esophagus  . CARDIAC CATHETERIZATION  2003  . COLONOSCOPY  08/2008   normal,  repeat exam in 5-7 years  . COLONOSCOPY  2004   rectal adenomatous polyp  . COLONOSCOPY N/A 12/01/2014   MOQ:HUTMLY rectum/elongated colon  . CORONARY ATHERECTOMY N/A 05/03/2017   Procedure: Coronary Atherectomy;  Surgeon: Leonie Man, MD;  Location: [facility];  Service: Cardiovascular;  Laterality: N/A;  . CORONARY STENT INTERVENTION N/A 05/03/2017   Procedure: Coronary Stent Intervention;  Surgeon: Leonie Man, MD;  Location: [facility];  Service: Cardiovascular;  Laterality:  N/A;  . ESOPHAGEAL DILATION N/A 02/07/2015   Procedure: ESOPHAGEAL DILATION;  Surgeon: Daneil Dolin, MD;  [facility];  Service: Endoscopy;  Laterality: N/A;  . ESOPHAGEAL DILATION N/A 08/17/2017   Procedure: ESOPHAGEAL DILATION;  Surgeon: Danie Binder, MD;  [facility];  Service: Endoscopy;  Laterality: N/A;  . ESOPHAGEAL DILATION N/A 09/23/2017   Procedure: ESOPHAGEAL DILATION;  Surgeon: Danie Binder, MD;  [facility];  Service: Endoscopy;  Laterality: N/A;  . ESOPHAGEAL MANOMETRY N/A 01/10/2018   Procedure: ESOPHAGEAL MANOMETRY (EM);  Surgeon: Danie Binder, MD;  Location: [facility];  Service: Endoscopy;  Laterality: N/A;  . ESOPHAGOGASTRODUODENOSCOPY  02/11/2012   Dr. Gala Romney: portal gastropathy, gastric erosions, esophageal ulcerations likely pill-induced, surveillance in 2 years  . ESOPHAGOGASTRODUODENOSCOPY N/A 12/01/2014   Dr. Volney American esophageal stricture dilated with the scope passage, portal gastropathy, negative H.pylori on gastric biopsies, esophageal biopsies benign  . ESOPHAGOGASTRODUODENOSCOPY N/A 02/07/2015   Procedure: ESOPHAGOGASTRODUODENOSCOPY (EGD);  Surgeon: Daneil Dolin, MD;  [facility];  Service: Endoscopy;  Laterality: N/A;  1115  . ESOPHAGOGASTRODUODENOSCOPY N/A 08/17/2017   benign-appearing esophageal stenosis s/p dilation, mild gastritis, pylorus stenosis s/p dilation  . ESOPHAGOGASTRODUODENOSCOPY N/A 09/23/2017   moderate benign-appearing instrinsic stenosis s/p dilation, mild gastritis  . ESOPHAGOGASTRODUODENOSCOPY N/A 10/18/2017   Benign-appearing esophageal stricture s/p dilation, gastritis, benign-appearing intrinsice moderate pylorus stenosis s/p dilation  . ESOPHAGOGASTRODUODENOSCOPY (EGD) WITH PROPOFOL N/A 12/23/2017   Dr. Gala Romney: single short Grade 2 varix mid esophagus, just above GE junction was nearly circumferential denuding ulcerations/loss of normal mucosal appearance (somewhat punched out) mucosa.  Query pill induced injury with path negative for malignancy. Portal hypertensive gastropathy, patent pylorus, normal duodenal bulb and second portion of duodenum, no dilation performed.   Marland Kitchen EYE SURGERY     RIGHT EYE REMOVED  . JOINT REPLACEMENT    . LEFT HEART CATH AND CORONARY ANGIOGRAPHY N/A 05/01/2017   Procedure: Left Heart Cath and Coronary Angiography;  Surgeon: Leonie Man, MD;  Location: [facility];  Service: Cardiovascular;  Laterality: N/A;  . Venia Minks DILATION N/A 10/18/2017   Procedure: Venia Minks DILATION;  Surgeon: Danie Binder, MD;  [facility];  Service: Endoscopy;  Laterality: N/A;  . s/p eye implant  1997   artificial eye, right  . SAVORY DILATION N/A 10/18/2017   Procedure: SAVORY DILATION;  Surgeon: Danie Binder, MD;  [facility];  Service: Endoscopy;  Laterality: N/A;  . TOTAL HIP ARTHROPLASTY  2002  . TOTAL HIP ARTHROPLASTY  2006/2012   revision in 2012  . TOTAL KNEE ARTHROPLASTY  1999/2003   left/right     reports that he has never smoked. He has never used smokeless tobacco. He reports current alcohol use. He reports that he does not use drugs.  No Known Allergies  Family History  Problem Relation Age of Onset  . Ovarian cancer Sister   . Colon cancer Neg Hx    Prior to  Admission medications   Medication Sig Start Date End Date Taking? Authorizing Provider  allopurinol (ZYLOPRIM) 100 MG tablet Take 1 tablet (100 mg total) by mouth daily. Patient taking differently: Take 300 mg by mouth daily.  08/19/17  Yes Tat, Shanon Brow, MD  atorvastatin (LIPITOR) 80 MG tablet Take 80 mg by mouth daily at 6 PM.  09/12/17  Yes [provider]  clopidogrel (PLAVIX) 75 MG tablet Take 1 tablet (75 mg total) by mouth daily. 05/07/17  Yes Barton Dubois, MD  doxycycline (VIBRAMYCIN) 100 MG capsule Take 1 capsule (100 mg total) by mouth 2 (two) times daily. 11/11/19  Yes Noemi Chapel, MD  folic acid (FOLVITE) 1 MG tablet Take 1 tablet (1  mg total) by mouth daily. 05/07/17  Yes Barton Dubois, MD  levothyroxine (SYNTHROID) 75 MCG tablet Take 75 mcg by mouth daily before breakfast.  09/12/17  Yes [provider]  metoCLOPramide (REGLAN) 5 MG tablet 1 po 30 minutes prior to meals bid Patient taking differently: Take 5 mg by mouth 2 (two) times daily before a meal. 1 po 30 minutes prior to meals bid 10/18/17  Yes Fields, Sandi L, MD  torsemide (DEMADEX) 20 MG tablet Take 20 mg by mouth daily.    Yes [provider]  traMADol (ULTRAM) 50 MG tablet Take 1 tablet (50 mg total) by mouth every 6 (six) hours as needed. Patient taking differently: Take 50 mg by mouth 3 (three) times daily.  08/18/17  Yes TatShanon Brow, MD  aspirin 81 MG chewable tablet Chew 81 mg by mouth daily.    [provider]  calcium carbonate (ANTACID EXTRA STRENGTH) 750 MG chewable tablet Chew 1 tablet by mouth as needed for heartburn.    [provider]  carvedilol (COREG) 3.125 MG tablet Take 1 tablet by mouth 2 (two) times daily. 12/20/17   [provider]  Cholecalciferol (VITAMIN D3) 2000 units CHEW Chew by mouth daily.    [provider]  diltiazem (CARDIZEM CD) 120 MG 24 hr capsule Take 1 capsule (120 mg total) by mouth daily. 02/20/18 02/20/19  Erline Hau, MD  Ergocalciferol (VITAMIN D2) 400 units TABS Take 1 tablet by mouth 2 (two) times daily.    [provider]  omeprazole (PRILOSEC) 20 MG capsule Take 1 capsule (20 mg total) by mouth 2 (two) times daily before a meal. 01/01/19   Mahala Menghini, PA-C  oxazepam (SERAX) 10 MG capsule Take 1 capsule by mouth at bedtime as needed. 08/11/18   [provider]  polyethylene glycol (MIRALAX / GLYCOLAX) packet Take 17 g daily by mouth. 09/25/17   Kathie Dike, MD  vitamin C (ASCORBIC ACID) 500 MG tablet Take 250 mg by mouth daily.     [provider]    Physical Exam: Vitals:   11/14/19 2030 11/14/19 2100 11/14/19 2130 11/14/19  2200  BP: 105/71 104/81 100/72 106/73  Pulse: 95 88 82 98  Resp: 13 18 13 12   Temp:      TempSrc:      SpO2: 96% 99% 95% 95%  Weight:      Height:        Constitutional: NAD, calm, comfortable Eyes: PERRL, lids and conjunctivae normal ENMT: Mucous membranes are moist. Posterior pharynx clear of any exudate or lesions. Neck: normal, supple, no masses, no thyromegaly Respiratory: Decreased breath sounds on bases, otherwise clear to auscultation bilaterally, no wheezing, no crackles. Normal respiratory effort. No accessory muscle use.  Cardiovascular: Irregularly irregular, positive  2/6 systolic murmur, no rubs / gallops. No extremity edema. 2+ pedal pulses. No carotid bruits.  Abdomen: Obese, nondistended.  Soft, no tenderness, no masses palpated. No hepatosplenomegaly. Bowel sounds positive.  Musculoskeletal: no clubbing / cyanosis. Good ROM, no contractures. Normal muscle tone.  Skin: no rashes, lesions, ulcers on limited dermatological examination. Neurologic: CN 2-12 grossly intact. Sensation intact, DTR normal. Strength 5/5 in all 4.  Psychiatric: Normal judgment and insight. Alert and oriented x 3. Normal mood.   Labs on Admission: I have personally reviewed following labs and imaging studies  CBC: Recent Labs  Lab 11/14/19 1458  WBC 11.2*  HGB 13.7  HCT 42.6  MCV 96.4  PLT 381   Basic Metabolic Panel: Recent Labs  Lab 11/14/19 1458 11/14/19 1730  NA 139  --   K 3.0*  --   CL 100  --   CO2 27  --   GLUCOSE 105*  --   BUN 20  --   CREATININE 1.76*  --   CALCIUM 8.9  --   MG  --  1.3*  PHOS  --  2.8   GFR: Estimated Creatinine Clearance: 45.6 mL/min (A) (by C-G formula based on SCr of 1.76 mg/dL (H)). Liver Function Tests: Recent Labs  Lab 11/14/19 1458  AST 16  ALT 10  ALKPHOS 78  BILITOT 1.0  PROT 6.4*  ALBUMIN 3.0*   Recent Labs  Lab 11/14/19 1458  LIPASE 21   No results for input(s): AMMONIA in the last 168 hours. Coagulation Profile: No  results for input(s): INR, PROTIME in the last 168 hours. Cardiac Enzymes: No results for input(s): CKTOTAL, CKMB, CKMBINDEX, TROPONINI in the last 168 hours. BNP (last 3 results) No results for input(s): PROBNP in the last 8760 hours. HbA1C: No results for input(s): HGBA1C in the last 72 hours. CBG: No results for input(s): GLUCAP in the last 168 hours. Lipid Profile: No results for input(s): CHOL, HDL, LDLCALC, TRIG, CHOLHDL, LDLDIRECT in the last 72 hours. Thyroid Function Tests: No results for input(s): TSH, T4TOTAL, FREET4, T3FREE, THYROIDAB in the last 72 hours. Anemia Panel: No results for input(s): VITAMINB12, FOLATE, FERRITIN, TIBC, IRON, RETICCTPCT in the last 72 hours. Urine analysis:    Component Value Date/Time   COLORURINE STRAW (A) 02/14/2018 1106   APPEARANCEUR CLEAR 02/14/2018 1106   LABSPEC 1.004 (L) 02/14/2018 1106   PHURINE 5.0 02/14/2018 1106   GLUCOSEU NEGATIVE 02/14/2018 1106   HGBUR NEGATIVE 02/14/2018 1106   BILIRUBINUR NEGATIVE 02/14/2018 1106   KETONESUR NEGATIVE 02/14/2018 1106   PROTEINUR NEGATIVE 02/14/2018 1106   UROBILINOGEN 0.2 11/12/2013 2315   NITRITE NEGATIVE 02/14/2018 1106   LEUKOCYTESUR NEGATIVE 02/14/2018 1106    Radiological Exams on Admission: DG Chest 2 View  Result Date: 11/14/2019 CLINICAL DATA:  Chest pain. Vomiting. EXAM: CHEST - 2 VIEW COMPARISON:  Chest x-rays dated 11/11/2019 02/16/2018 and 01/05/2018 FINDINGS: There is a small left pleural effusion. Secondary atelectasis at the left lung base. Tiny right effusion. Heart size and vascularity are normal. No significant bone abnormality. IMPRESSION: 1. Small left pleural effusion with slight secondary atelectasis at the left lung base, unchanged since the prior study of 11/11/2019. 2. Tiny right effusion, unchanged. Electronically Signed   By: Lorriane Shire M.D.   On: 11/14/2019 15:05    EKG: Independently reviewed.  Vent. rate 102 BPM PR interval * ms QRS duration 118  ms QT/QTc 393/465 ms P-R-T axes * -32 101 Atrial fibrillation Paired ventricular premature complexes Incomplete left bundle  branch block Low voltage, precordial leads  Assessment/Plan Principal Problem:   Chest pain Observation/telemetry. Continue close cardio monitoring. Repeat EKG in a.m. Check echocardiogram in the morning if possible. Continue antiplatelet therapy, beta-blocker and statin. Should follow-up with cardiology as an outpatient.  Active Problems:   Prolonged QT interval Magnesium sulfate given.    Hypomagnesemia Replacement given. Follow-up magnesium level as needed.    GERD (gastroesophageal reflux disease) Continue PPI.    CKD (chronic kidney disease), stage III Renal function seems to be stable. Follow-up BUN, creatinine and electrolytes.    Hypothyroidism Continue levothyroxine once dose confirmed    Alcoholic cirrhosis of liver without ascites ([place]) No signs of decompensation at this time.    Atrial fibrillation, chronic (HCC) CHA?DS?-VASc Score of at least 4. Continue beta-blocker. Not on anticoagulation.    CAD (coronary artery disease) On aspirin, carvedilol and atorvastatin.     DVT prophylaxis: Lovenox SQ. Code Status: Full code. Family Communication:  Disposition Plan: Overnight chest pain observation. Consults called: Admission status: Observation/telemetry.   Reubin Milan MD [facility]  If 7PM-7AM, please contact night-coverage [email or web address]  11/14/2019, 10:13 PM   This document was prepared using Dragon voice recognition software and may contain some unintended transcription errors.

## 2019-11-14 NOTE — ED Provider Notes (Signed)
[facility] EMERGENCY DEPARTMENT Provider Note   CSN: [id number] Arrival date & time: 11/14/19  1418     History Chief Complaint  Patient presents with  . Chest Pain    Justin Parsons is a 78 y.o. male with history of A. fib on Plavix, CAD (cath in 2018 shows multivessel disease), liver cirrhosis, CKD, hypertension, hypothyroidism, alcoholism presents for evaluation of acute onset, persistent chest pains.  Reports symptoms began while sitting in bed around 6 PM last night.  Pain is substernal, burning, does not radiate.  He has difficulty describing the pain and is unsure if he has any associated shortness of breath.  Has had some lightheadedness and diaphoresis.  Reports has been experiencing nausea and vomiting for the last few days and intermittent upper abdominal pains.  States this is not necessarily unusual for him, states "sometimes when I take my pills with orange juice I will throw it up and I take my medications with water I want".  Last bowel movement was yesterday and was watery and nonbloody.  He called EMS today who gave 4 doses of nitroglycerin with significant improvement in pain for the patient.  The history is provided by the patient.       Past Medical History:  Diagnosis Date  . Adenomatous polyp of colon   . Alcoholism ([place])    [place]  . Arthritis   . Atrial fibrillation ([place])   . Blindness of right eye 1997  . CAD (coronary artery disease)    Multivessel disease at cardiac catheterization June 2018, DES to LAD June 2018 with significant residual disease including diagonal and obtuse marginal vessels as well as RCA  . Cirrhosis of liver (HCC)    Associated portal gastropathy  . CKD (chronic kidney disease) stage 3, GFR 30-59 ml/min   . Essential hypertension   . Hiatal hernia   . Hypothyroidism   . 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 TIA (transient ischemic attack)     Patient Active Problem List   Diagnosis Date Noted  . Chest pain 11/14/2019    . Hypomagnesemia 11/14/2019  . CAD (coronary artery disease) 11/14/2019  . Prolonged QT interval 11/14/2019  . Generalized weakness   . Diarrhea 02/14/2018  . Acute gouty arthritis 01/15/2018  . Pressure injury of skin 01/12/2018  . Gastritis due to nonsteroidal anti-inflammatory drug   . 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 Intractable vomiting 09/20/2017  . Atrial fibrillation with RVR ([place]) 09/20/2017  . Atrial fibrillation, chronic ([place]) 08/18/2017  . Gastric outlet obstruction   . AKI (acute kidney injury) ([place])   . Nausea with vomiting 08/15/2017  . Hypotension 08/15/2017  . Dehydration 08/15/2017  . Intractable vomiting with nausea 08/15/2017  . Transaminasemia 08/15/2017  . AF (paroxysmal atrial fibrillation) ([place]) 08/15/2017  . Alcoholic cirrhosis of liver without ascites ([place]) 08/15/2017  . Abnormal liver enzymes   . Coronary artery disease involving native coronary artery of native heart with angina pectoris ([place])   . Tobacco abuse   . Acute renal failure superimposed on stage 3 chronic kidney disease ([place])   . Alcohol use disorder   . Non-ST elevation (NSTEMI) myocardial infarction ([place]) 04/29/2017  . Delirium tremens ([place])   . Shock circulatory ([place])   . Confusion   . Acute encephalopathy 04/27/2017  . Schatzki's ring   . Esophageal varices ([place])   . History of esophageal stricture 01/20/2015  . Esophageal stricture   . History of colonic polyps   . Dysphagia,  pharyngoesophageal phase 11/04/2014  . Suicidal ideation 11/17/2013  . Acute renal failure ([place]) 11/17/2013  . Acute diastolic CHF (congestive heart failure) ([place]) 11/13/2013  . Acute respiratory failure ([place]) 11/13/2013  . Community acquired pneumonia 11/12/2013  . A-fib ([place]) 11/12/2013  . CKD (chronic kidney disease), stage III 11/12/2013  . Hypothyroidism 11/12/2013  . Dyspnea 11/12/2013  . Thrombocytopenia ([place]) 06/05/2012  . ARF (acute renal failure) ([place]) 06/04/2012  . Obesity  06/04/2012  . Cirrhosis ([place]) 01/29/2012  . Adenomatous polyp 01/29/2012  . Renal insufficiency 05/01/2011  . Anemia 05/01/2011  . Rectal bleed 05/01/2011  . GERD (gastroesophageal reflux disease) 05/01/2011    Past Surgical History:  Procedure Laterality Date  . BIOPSY  12/23/2017   Procedure: BIOPSY;  Surgeon: Daneil Dolin, MD;  [facility];  Service: Endoscopy;;  esophagus  . CARDIAC CATHETERIZATION  2003  . COLONOSCOPY  08/2008   normal, repeat exam in 5-7 years  . COLONOSCOPY  2004   rectal adenomatous polyp  . COLONOSCOPY N/A 12/01/2014   RWE:RXVQMG rectum/elongated colon  . CORONARY ATHERECTOMY N/A 05/03/2017   Procedure: Coronary Atherectomy;  Surgeon: Leonie Man, MD;  Location: [place] [facility] LAB;  Service: Cardiovascular;  Laterality: N/A;  . CORONARY STENT INTERVENTION N/A 05/03/2017   Procedure: Coronary Stent Intervention;  Surgeon: Leonie Man, MD;  Location: [place] [facility] LAB;  Service: Cardiovascular;  Laterality: N/A;  . ESOPHAGEAL DILATION N/A 02/07/2015   Procedure: ESOPHAGEAL DILATION;  Surgeon: Daneil Dolin, MD;  [facility];  Service: Endoscopy;  Laterality: N/A;  . ESOPHAGEAL DILATION N/A 08/17/2017   Procedure: ESOPHAGEAL DILATION;  Surgeon: Danie Binder, MD;  [facility];  Service: Endoscopy;  Laterality: N/A;  . ESOPHAGEAL DILATION N/A 09/23/2017   Procedure: ESOPHAGEAL DILATION;  Surgeon: Danie Binder, MD;  [facility];  Service: Endoscopy;  Laterality: N/A;  . ESOPHAGEAL MANOMETRY N/A 01/10/2018   Procedure: ESOPHAGEAL MANOMETRY (EM);  Surgeon: Danie Binder, MD;  Location: [facility];  Service: Endoscopy;  Laterality: N/A;  . ESOPHAGOGASTRODUODENOSCOPY  02/11/2012   Dr. Gala Romney: portal gastropathy, gastric erosions, esophageal ulcerations likely pill-induced, surveillance in 2 years  . ESOPHAGOGASTRODUODENOSCOPY N/A 12/01/2014   Dr. Volney American esophageal stricture dilated with the scope  passage, portal gastropathy, negative H.pylori on gastric biopsies, esophageal biopsies benign  . ESOPHAGOGASTRODUODENOSCOPY N/A 02/07/2015   Procedure: ESOPHAGOGASTRODUODENOSCOPY (EGD);  Surgeon: Daneil Dolin, MD;  [facility];  Service: Endoscopy;  Laterality: N/A;  1115  . ESOPHAGOGASTRODUODENOSCOPY N/A 08/17/2017   benign-appearing esophageal stenosis s/p dilation, mild gastritis, pylorus stenosis s/p dilation  . ESOPHAGOGASTRODUODENOSCOPY N/A 09/23/2017   moderate benign-appearing instrinsic stenosis s/p dilation, mild gastritis  . ESOPHAGOGASTRODUODENOSCOPY N/A 10/18/2017   Benign-appearing esophageal stricture s/p dilation, gastritis, benign-appearing intrinsice moderate pylorus stenosis s/p dilation  . ESOPHAGOGASTRODUODENOSCOPY (EGD) WITH PROPOFOL N/A 12/23/2017   Dr. Gala Romney: single short Grade 2 varix mid esophagus, just above GE junction was nearly circumferential denuding ulcerations/loss of normal mucosal appearance (somewhat punched out) mucosa. Query pill induced injury with path negative for malignancy. Portal hypertensive gastropathy, patent pylorus, normal duodenal bulb and second portion of duodenum, no dilation performed.   Marland Kitchen EYE SURGERY     RIGHT EYE REMOVED  . JOINT REPLACEMENT    . LEFT HEART CATH AND CORONARY ANGIOGRAPHY N/A 05/01/2017   Procedure: Left Heart Cath and Coronary Angiography;  Surgeon: Leonie Man, MD;  Location: [place] [facility];  Service: Cardiovascular;  Laterality:  N/A;  Venia Minks DILATION N/A 10/18/2017   Procedure: Venia Minks DILATION;  Surgeon: Danie Binder, MD;  [facility];  Service: Endoscopy;  Laterality: N/A;  . s/p eye implant  1997   artificial eye, right  . SAVORY DILATION N/A 10/18/2017   Procedure: SAVORY DILATION;  Surgeon: Danie Binder, MD;  [facility];  Service: Endoscopy;  Laterality: N/A;  . TOTAL HIP ARTHROPLASTY  2002  . TOTAL HIP ARTHROPLASTY  2006/2012   revision in 2012  . TOTAL KNEE  ARTHROPLASTY  1999/2003   left/right       Family History  Problem Relation Age of Onset  . Ovarian cancer Sister   . Colon cancer Neg Hx     Social History   Tobacco Use  . Smoking status: Never Smoker  . Smokeless tobacco: Never Used  . Tobacco comment: used to chew tobacco, none in 15 years  Substance Use Topics  . Alcohol use: Yes    Alcohol/week: 0.0 standard drinks    Comment: occ beer  . Drug use: No    Home Medications Prior to Admission medications   Medication Sig Start Date End Date Taking? Authorizing Provider  allopurinol (ZYLOPRIM) 300 MG tablet Take 300 mg by mouth daily. 09/15/19  Yes [provider]  atorvastatin (LIPITOR) 80 MG tablet Take 80 mg by mouth daily at 6 PM.  09/12/17  Yes [provider]  clopidogrel (PLAVIX) 75 MG tablet Take 1 tablet (75 mg total) by mouth daily. 05/07/17  Yes Barton Dubois, MD  diclofenac (VOLTAREN) 75 MG EC tablet Take 75 mg by mouth 2 (two) times daily. 11/06/19  Yes [provider]  doxycycline (VIBRAMYCIN) 100 MG capsule Take 1 capsule (100 mg total) by mouth 2 (two) times daily. 11/11/19  Yes Noemi Chapel, MD  gabapentin (NEURONTIN) 300 MG capsule Take 300 mg by mouth 3 (three) times daily as needed. 10/10/19  Yes [provider]  levothyroxine (SYNTHROID) 75 MCG tablet Take 75 mcg by mouth daily before breakfast.  09/12/17  Yes [provider]  metoCLOPramide (REGLAN) 5 MG tablet 1 po 30 minutes prior to meals bid Patient taking differently: Take 5 mg by mouth 2 (two) times daily before a meal. 1 po 30 minutes prior to meals bid 10/18/17  Yes Fields, Sandi L, MD  pantoprazole (PROTONIX) 40 MG tablet Take 40 mg by mouth 2 (two) times daily. 10/22/19  Yes [provider]  torsemide (DEMADEX) 20 MG tablet Take 20 mg by mouth daily.    Yes [provider]  traMADol (ULTRAM) 50 MG tablet Take 1 tablet (50 mg total) by mouth every 6 (six) hours as needed. Patient  taking differently: Take 50 mg by mouth 3 (three) times daily.  08/18/17  Yes Tat, Shanon Brow, MD  allopurinol (ZYLOPRIM) 100 MG tablet Take 1 tablet (100 mg total) by mouth daily. Patient not taking: Reported on 11/15/2019 08/19/17   Orson Eva, MD  carvedilol (COREG) 3.125 MG tablet Take 1 tablet by mouth 2 (two) times daily. 12/20/17   [provider]  Cholecalciferol (VITAMIN D3) 2000 units CHEW Chew by mouth daily.    [provider]  diltiazem (CARDIZEM CD) 120 MG 24 hr capsule Take 1 capsule (120 mg total) by mouth daily. 02/20/18 02/20/19  Isaac Bliss, Rayford Halsted, MD  folic acid (FOLVITE) 1 MG tablet Take 1 tablet (1 mg total) by mouth daily. Patient not taking: Reported on 11/15/2019 05/07/17   Barton Dubois, MD  polyethylene glycol (MIRALAX / GLYCOLAX) packet Take 17 g daily by mouth. Patient not taking: Reported on 11/15/2019 09/25/17   Kathie Dike, MD    Allergies    Patient has no known allergies.  Review of Systems   Review of Systems  Constitutional: Negative for chills and fever.  Cardiovascular: Positive for chest pain.  Gastrointestinal: Positive for abdominal pain, diarrhea, nausea and vomiting.  All other systems reviewed and are negative.   Physical Exam Updated Vital Signs BP 105/68   Pulse 93   Temp 98.2 F (36.8 C) (Oral)   Resp 17   Ht 6' 2"  (1.88 m)   Wt 113.4 kg   SpO2 95%   BMI 32.10 kg/m   Physical Exam Vitals and nursing note reviewed.  Constitutional:      General: He is not in acute distress.    Comments: Chronically ill in appearance.  HENT:     Head: Normocephalic and atraumatic.  Eyes:     General:        Right eye: No discharge.        Left eye: No discharge.     Conjunctiva/sclera: Conjunctivae normal.  Neck:     Vascular: No JVD.     Trachea: No tracheal deviation.  Cardiovascular:     Rate and Rhythm: Normal rate. Rhythm irregular.     Pulses:          Radial pulses are 2+ on the right side and 2+ on the left  side.       Dorsalis pedis pulses are 2+ on the right side and 2+ on the left side.       Posterior tibial pulses are 2+ on the right side and 2+ on the left side.  Pulmonary:     Effort: Pulmonary effort is normal.     Comments: Globally diminished breath sounds. Chest:     Chest wall: No tenderness.  Abdominal:     General: There is no distension.     Palpations: Abdomen is soft. There is no mass.     Tenderness: There is no abdominal tenderness. There is no guarding.  Musculoskeletal:     Right lower leg: No tenderness. No edema.     Left lower leg: No tenderness. No edema.  Skin:    General: Skin is warm and dry.     Findings: No erythema.  Neurological:     Mental Status: He is alert.  Psychiatric:        Behavior: Behavior normal.     ED Results / Procedures / Treatments   Labs (all labs ordered are listed, but only abnormal results are displayed) Labs Reviewed  BASIC METABOLIC PANEL - Abnormal; Notable for the following components:      Result Value   Potassium 3.0 (*)    Glucose, Bld 105 (*)    Creatinine, Ser 1.76 (*)    GFR calc non Af Amer 36 (*)    GFR calc Af Amer 42 (*)    All other components within normal limits  CBC - Abnormal; Notable for the following components:   WBC 11.2 (*)    All other components within normal limits  HEPATIC FUNCTION PANEL - Abnormal; Notable for the following components:   Total Protein 6.4 (*)    Albumin 3.0 (*)    Bilirubin, Direct 0.3 (*)    All other components within normal limits  URINALYSIS, ROUTINE W REFLEX MICROSCOPIC - Abnormal; Notable for the following components:   Ketones,  ur 5 (*)    Leukocytes,Ua TRACE (*)    All other components within normal limits  MAGNESIUM - Abnormal; Notable for the following components:   Magnesium 1.3 (*)    All other components within normal limits  TROPONIN I (HIGH SENSITIVITY) - Abnormal; Notable for the following components:   Troponin I (High Sensitivity) 19 (*)    All other  components within normal limits  TROPONIN I (HIGH SENSITIVITY) - Abnormal; Notable for the following components:   Troponin I (High Sensitivity) 20 (*)    All other components within normal limits  SARS CORONAVIRUS 2 (TAT 6-24 HRS)  LIPASE, BLOOD  PHOSPHORUS  COMPREHENSIVE METABOLIC PANEL    EKG EKG Interpretation  Date/Time:  Sunday November 15 2019 01:07:04 EST Ventricular Rate:  102 PR Interval:    QRS Duration: 118 QT Interval:  393 QTC Calculation: 465 R Axis:   -32 Text Interpretation: Atrial fibrillation Paired ventricular premature complexes Incomplete left bundle branch block Low voltage, precordial leads No significant change since last tracing Confirmed by Isla Pence [phone]) on 11/15/2019 1:16:40 AM   Radiology DG Chest 2 View  Result Date: 11/14/2019 CLINICAL DATA:  Chest pain. Vomiting. EXAM: CHEST - 2 VIEW COMPARISON:  Chest x-rays dated 11/11/2019 02/16/2018 and 01/05/2018 FINDINGS: There is a small left pleural effusion. Secondary atelectasis at the left lung base. Tiny right effusion. Heart size and vascularity are normal. No significant bone abnormality. IMPRESSION: 1. Small left pleural effusion with slight secondary atelectasis at the left lung base, unchanged since the prior study of 11/11/2019. 2. Tiny right effusion, unchanged. Electronically Signed   By: Lorriane Shire M.D.   On: 11/14/2019 15:05    Procedures Procedures (including critical care time)  Medications Ordered in ED Medications  acetaminophen (TYLENOL) tablet 650 mg (has no administration in time range)    Or  acetaminophen (TYLENOL) suppository 650 mg (has no administration in time range)  carvedilol (COREG) tablet 3.125 mg (3.125 mg Oral Not Given 11/15/19 0815)  allopurinol (ZYLOPRIM) tablet 100 mg (has no administration in time range)  aspirin chewable tablet 81 mg (has no administration in time range)  atorvastatin (LIPITOR) tablet 80 mg (has no administration in time range)    cholecalciferol (VITAMIN D) tablet 1,000 Units (has no administration in time range)  clopidogrel (PLAVIX) tablet 75 mg (has no administration in time range)  doxycycline (VIBRA-TABS) tablet 100 mg (has no administration in time range)  folic acid (FOLVITE) tablet 1 mg (has no administration in time range)  levothyroxine (SYNTHROID) tablet 75 mcg (has no administration in time range)  torsemide (DEMADEX) tablet 20 mg (has no administration in time range)  potassium chloride SA (KLOR-CON) CR tablet 20 mEq (has no administration in time range)  pantoprazole (PROTONIX) EC tablet 40 mg (has no administration in time range)  potassium chloride SA (KLOR-CON) CR tablet 20 mEq (20 mEq Oral Given 11/14/19 1842)  morphine 2 MG/ML injection 2 mg (2 mg Intravenous Given 11/14/19 2106)  magnesium sulfate IVPB 2 g 50 mL (0 g Intravenous Stopped 11/15/19 0010)  LORazepam (ATIVAN) tablet 1 mg (1 mg Oral Given 11/15/19 0128)    ED Course  I have reviewed the triage vital signs and the nursing notes.  Pertinent labs & imaging results that were available during my care of the patient were reviewed by me and considered in my medical decision making (see chart for details).    MDM Rules/Calculators/A&P  Patient presenting for evaluation of acute onset burning substernal chest pain since the night before.  He is afebrile, vital signs are stable.  He is nontoxic in appearance.  Pain is nonexertional, pleuritic, or reproducible on palpation.  He has had some nausea and vomiting but reports this is not unusual for him.  Abdomen is soft, nontender.  He was recently seen for constipation.  EKG shows A. fib with occasional PVCs but no acute ischemic abnormalities.  Serial troponins are elevated but stable.  Remainder of blood work reviewed by me shows mild nonspecific leukocytosis, improved from previous labs.  He is mildly hypokalemic, replenished orally in the ED.  Kidney function is around his  baseline.  LFTs are not acutely elevated and lipase is within normal limits.  Doubt acute surgical abdominal pathology given reassuring blood work and physical examination.  Chart review shows that he had a cath in 2018 for evaluation of NSTEMI and was found to have severe multilevel disease only somewhat amenable to stent placement.  Unfortunately he has not followed up with a cardiologist outpatient since that admission but has been seen by his PCP.  Currently still on Plavix though note from cardiology mentions possibly starting him on a NOAC.  Given he is high risk and has questionable follow-up, I feel he would benefit from admission to the hospital for further evaluation and management of his chest pains.  8:22 PM CONSULT: Spoke with Dr. Einar Gip with cardiology who recommends overnight observation, plan for f/u with Dr. Einar Gip in the office in 7-10 days.   Back with Dr. Olevia Bowens with [facility] hospitalist service who agrees to send care patient and bring him into the hospital for further evaluation and management.  Patient was seen and evaluated by Dr. Laverta Baltimore who agrees with assessment and plan at this time. Final Clinical Impression(s) / ED Diagnoses Final diagnoses:  Substernal chest pain relieved by nitroglycerin    Rx / DC Orders ED Discharge Orders    None       Debroah Baller 11/15/19 1207    Margette Fast, MD 11/15/19 1306

## 2019-11-14 NOTE — ED Triage Notes (Signed)
Pt states he started having some chest pain last night with vomiting x2. Took nitro with relief of pain.

## 2019-11-15 ENCOUNTER — Observation Stay (HOSPITAL_COMMUNITY): Payer: Medicare Other

## 2019-11-15 DIAGNOSIS — R1013 Epigastric pain: Secondary | ICD-10-CM | POA: Diagnosis present

## 2019-11-15 DIAGNOSIS — I2510 Atherosclerotic heart disease of native coronary artery without angina pectoris: Secondary | ICD-10-CM | POA: Diagnosis present

## 2019-11-15 DIAGNOSIS — K3189 Other diseases of stomach and duodenum: Secondary | ICD-10-CM | POA: Diagnosis present

## 2019-11-15 DIAGNOSIS — K7460 Unspecified cirrhosis of liver: Secondary | ICD-10-CM | POA: Diagnosis not present

## 2019-11-15 DIAGNOSIS — J90 Pleural effusion, not elsewhere classified: Secondary | ICD-10-CM | POA: Diagnosis present

## 2019-11-15 DIAGNOSIS — I361 Nonrheumatic tricuspid (valve) insufficiency: Secondary | ICD-10-CM | POA: Diagnosis not present

## 2019-11-15 DIAGNOSIS — H5461 Unqualified visual loss, right eye, normal vision left eye: Secondary | ICD-10-CM | POA: Diagnosis present

## 2019-11-15 DIAGNOSIS — R9431 Abnormal electrocardiogram [ECG] [EKG]: Secondary | ICD-10-CM | POA: Diagnosis present

## 2019-11-15 DIAGNOSIS — I447 Left bundle-branch block, unspecified: Secondary | ICD-10-CM | POA: Diagnosis present

## 2019-11-15 DIAGNOSIS — E876 Hypokalemia: Secondary | ICD-10-CM | POA: Diagnosis present

## 2019-11-15 DIAGNOSIS — I493 Ventricular premature depolarization: Secondary | ICD-10-CM | POA: Diagnosis present

## 2019-11-15 DIAGNOSIS — R079 Chest pain, unspecified: Secondary | ICD-10-CM | POA: Diagnosis not present

## 2019-11-15 DIAGNOSIS — I5032 Chronic diastolic (congestive) heart failure: Secondary | ICD-10-CM | POA: Diagnosis present

## 2019-11-15 DIAGNOSIS — M1990 Unspecified osteoarthritis, unspecified site: Secondary | ICD-10-CM | POA: Diagnosis present

## 2019-11-15 DIAGNOSIS — R112 Nausea with vomiting, unspecified: Secondary | ICD-10-CM | POA: Diagnosis not present

## 2019-11-15 DIAGNOSIS — N1832 Chronic kidney disease, stage 3b: Secondary | ICD-10-CM | POA: Diagnosis present

## 2019-11-15 DIAGNOSIS — K766 Portal hypertension: Secondary | ICD-10-CM | POA: Diagnosis present

## 2019-11-15 DIAGNOSIS — K7581 Nonalcoholic steatohepatitis (NASH): Secondary | ICD-10-CM | POA: Diagnosis present

## 2019-11-15 DIAGNOSIS — I340 Nonrheumatic mitral (valve) insufficiency: Secondary | ICD-10-CM | POA: Diagnosis not present

## 2019-11-15 DIAGNOSIS — F1020 Alcohol dependence, uncomplicated: Secondary | ICD-10-CM | POA: Diagnosis present

## 2019-11-15 DIAGNOSIS — K7030 Alcoholic cirrhosis of liver without ascites: Secondary | ICD-10-CM | POA: Diagnosis present

## 2019-11-15 DIAGNOSIS — I130 Hypertensive heart and chronic kidney disease with heart failure and stage 1 through stage 4 chronic kidney disease, or unspecified chronic kidney disease: Secondary | ICD-10-CM | POA: Diagnosis present

## 2019-11-15 DIAGNOSIS — K219 Gastro-esophageal reflux disease without esophagitis: Secondary | ICD-10-CM | POA: Diagnosis present

## 2019-11-15 DIAGNOSIS — K5900 Constipation, unspecified: Secondary | ICD-10-CM | POA: Diagnosis present

## 2019-11-15 DIAGNOSIS — E039 Hypothyroidism, unspecified: Secondary | ICD-10-CM | POA: Diagnosis present

## 2019-11-15 DIAGNOSIS — I083 Combined rheumatic disorders of mitral, aortic and tricuspid valves: Secondary | ICD-10-CM | POA: Diagnosis present

## 2019-11-15 DIAGNOSIS — I482 Chronic atrial fibrillation, unspecified: Secondary | ICD-10-CM | POA: Diagnosis present

## 2019-11-15 DIAGNOSIS — J9811 Atelectasis: Secondary | ICD-10-CM | POA: Diagnosis present

## 2019-11-15 DIAGNOSIS — Z20828 Contact with and (suspected) exposure to other viral communicable diseases: Secondary | ICD-10-CM | POA: Diagnosis present

## 2019-11-15 LAB — URINALYSIS, ROUTINE W REFLEX MICROSCOPIC
Bacteria, UA: NONE SEEN
Bilirubin Urine: NEGATIVE
Glucose, UA: NEGATIVE mg/dL
Hgb urine dipstick: NEGATIVE
Ketones, ur: 5 mg/dL — AB
Nitrite: NEGATIVE
Protein, ur: NEGATIVE mg/dL
Specific Gravity, Urine: 1.017 (ref 1.005–1.030)
pH: 5 (ref 5.0–8.0)

## 2019-11-15 LAB — SARS CORONAVIRUS 2 (TAT 6-24 HRS): SARS Coronavirus 2: NEGATIVE

## 2019-11-15 LAB — ECHOCARDIOGRAM COMPLETE
Height: 74 in
Weight: 4000.03 oz

## 2019-11-15 MED ORDER — ATORVASTATIN CALCIUM 40 MG PO TABS
80.0000 mg | ORAL_TABLET | Freq: Every day | ORAL | Status: DC
  Administered 2019-11-15 – 2019-11-17 (×3): 80 mg via ORAL
  Filled 2019-11-15 (×3): qty 2

## 2019-11-15 MED ORDER — ALLOPURINOL 100 MG PO TABS
100.0000 mg | ORAL_TABLET | Freq: Every day | ORAL | Status: DC
  Administered 2019-11-15 – 2019-11-18 (×4): 100 mg via ORAL
  Filled 2019-11-15 (×5): qty 1

## 2019-11-15 MED ORDER — TORSEMIDE 20 MG PO TABS
20.0000 mg | ORAL_TABLET | Freq: Every day | ORAL | Status: DC
  Administered 2019-11-15 – 2019-11-18 (×4): 20 mg via ORAL
  Filled 2019-11-15 (×6): qty 1

## 2019-11-15 MED ORDER — CLOPIDOGREL BISULFATE 75 MG PO TABS
75.0000 mg | ORAL_TABLET | Freq: Every day | ORAL | Status: DC
  Administered 2019-11-15 – 2019-11-16 (×2): 75 mg via ORAL
  Filled 2019-11-15 (×2): qty 1

## 2019-11-15 MED ORDER — DOXYCYCLINE HYCLATE 100 MG PO TABS
100.0000 mg | ORAL_TABLET | Freq: Two times a day (BID) | ORAL | Status: AC
  Administered 2019-11-15 – 2019-11-16 (×4): 100 mg via ORAL
  Filled 2019-11-15 (×4): qty 1

## 2019-11-15 MED ORDER — CARVEDILOL 3.125 MG PO TABS
3.1250 mg | ORAL_TABLET | Freq: Two times a day (BID) | ORAL | Status: DC
  Administered 2019-11-15 – 2019-11-16 (×2): 3.125 mg via ORAL
  Filled 2019-11-15 (×5): qty 1

## 2019-11-15 MED ORDER — ASPIRIN 81 MG PO CHEW
81.0000 mg | CHEWABLE_TABLET | Freq: Every day | ORAL | Status: DC
  Administered 2019-11-15 – 2019-11-18 (×4): 81 mg via ORAL
  Filled 2019-11-15 (×4): qty 1

## 2019-11-15 MED ORDER — LEVOTHYROXINE SODIUM 75 MCG PO TABS
75.0000 ug | ORAL_TABLET | Freq: Every day | ORAL | Status: DC
  Administered 2019-11-16 – 2019-11-18 (×3): 75 ug via ORAL
  Filled 2019-11-15 (×3): qty 1

## 2019-11-15 MED ORDER — TRAZODONE HCL 50 MG PO TABS
50.0000 mg | ORAL_TABLET | Freq: Once | ORAL | Status: AC
  Administered 2019-11-15: 50 mg via ORAL
  Filled 2019-11-15: qty 1

## 2019-11-15 MED ORDER — LORAZEPAM 1 MG PO TABS
1.0000 mg | ORAL_TABLET | Freq: Once | ORAL | Status: AC
  Administered 2019-11-15: 1 mg via ORAL
  Filled 2019-11-15: qty 1

## 2019-11-15 MED ORDER — VITAMIN D3 25 MCG (1000 UNIT) PO TABS
1000.0000 [IU] | ORAL_TABLET | Freq: Every day | ORAL | Status: DC
  Administered 2019-11-16 – 2019-11-18 (×3): 1000 [IU] via ORAL
  Filled 2019-11-15 (×9): qty 1

## 2019-11-15 MED ORDER — CARVEDILOL 3.125 MG PO TABS: 3.1250 mg | ORAL_TABLET | Freq: Two times a day (BID) | ORAL | Status: DC

## 2019-11-15 MED ORDER — FOLIC ACID 1 MG PO TABS
1.0000 mg | ORAL_TABLET | Freq: Every day | ORAL | Status: DC
  Administered 2019-11-15 – 2019-11-18 (×4): 1 mg via ORAL
  Filled 2019-11-15 (×4): qty 1

## 2019-11-15 MED ORDER — POTASSIUM CHLORIDE CRYS ER 20 MEQ PO TBCR
20.0000 meq | EXTENDED_RELEASE_TABLET | Freq: Every day | ORAL | Status: DC
  Administered 2019-11-15 – 2019-11-18 (×4): 20 meq via ORAL
  Filled 2019-11-15 (×4): qty 1

## 2019-11-15 MED ORDER — PANTOPRAZOLE SODIUM 40 MG PO TBEC
40.0000 mg | DELAYED_RELEASE_TABLET | Freq: Every day | ORAL | Status: DC
  Administered 2019-11-15 – 2019-11-16 (×2): 40 mg via ORAL
  Filled 2019-11-15 (×2): qty 1

## 2019-11-15 NOTE — ED Notes (Signed)
Gave patient a breakfast tray

## 2019-11-15 NOTE — Progress Notes (Signed)
*  PRELIMINARY RESULTS* Echocardiogram 2D Echocardiogram has been performed.  Samuel Germany 11/15/2019, 4:00 PM

## 2019-11-15 NOTE — ED Notes (Signed)
ekg given to Dr. Gilford Raid

## 2019-11-15 NOTE — Progress Notes (Signed)
Subjective: This patient was admitted with apparent chest pain.  On closer questioning today, he points to the epigastric area for the pain.  He says the pain is sharp in nature and is undulating.  It is not associated with any nausea or vomiting.  It does not radiate to the back.  It does not radiate to his arms or up his chest and neck.  Initial troponin levels are very marginally elevated, not clinically significant.   Objective: Vital signs in last 24 hours: Temp:  [98.2 F (36.8 C)] 98.2 F (36.8 C) (12/26 1438) Pulse Rate:  [41-102] 93 (12/27 0800) Resp:  [12-21] 13 (12/27 0800) BP: (97-120)/(69-88) 107/84 (12/27 0800) SpO2:  [94 %-99 %] 95 % (12/27 0800) Weight:  [113.4 kg] 113.4 kg (12/26 1433) Looks systemically well and does not appear to be in pain.  Abdominal examination shows some mild tenderness in the epigastric area.  There is no rebound tenderness.  Bowel sounds are heard.  He does not have an acute abdomen.  Heart sounds are present without gallop rhythm.  Lung fields are clear without pleural rub or pericardial rub.  He is alert and orientated without any focal neurologic signs. Intake/Output from previous day: 12/26 0701 - 12/27 0700 In: 50 [IV Piggyback:50] Out: -  Intake/Output this shift: No intake/output data recorded.  Recent Labs    11/14/19 1458  HGB 13.7   Recent Labs    11/14/19 1458  WBC 11.2*  RBC 4.42  HCT 42.6  PLT 175   Recent Labs    11/14/19 1458  NA 139  K 3.0*  CL 100  CO2 27  BUN 20  CREATININE 1.76*  GLUCOSE 105*  CALCIUM 8.9     Assessment/Plan: 1.  Epigastric pain.  This may represent gastritis.  I will put him on a PPI.  Repeated electrocardiogram this morning does not show any major changes in left bundle branch block makes difficult/impossible interpretation of ST-T wave changes.  He remains in atrial fibrillation with his controlled rate. 2.  Hypokalemia.  Replete. 3.  Chronic kidney disease.  Continue to monitor. 4.   Prolonged QT interval.  Magnesium sulfate was given. 5.  Anticipate discharge tomorrow depending on resolution of symptoms.   Brandye Inthavong C Starsha Morning 11/15/2019, 10:08 AM

## 2019-11-16 ENCOUNTER — Encounter (HOSPITAL_COMMUNITY): Payer: Self-pay | Admitting: Internal Medicine

## 2019-11-16 DIAGNOSIS — K7030 Alcoholic cirrhosis of liver without ascites: Secondary | ICD-10-CM

## 2019-11-16 DIAGNOSIS — R9431 Abnormal electrocardiogram [ECG] [EKG]: Secondary | ICD-10-CM

## 2019-11-16 DIAGNOSIS — R079 Chest pain, unspecified: Secondary | ICD-10-CM

## 2019-11-16 DIAGNOSIS — I482 Chronic atrial fibrillation, unspecified: Secondary | ICD-10-CM

## 2019-11-16 DIAGNOSIS — I2510 Atherosclerotic heart disease of native coronary artery without angina pectoris: Secondary | ICD-10-CM

## 2019-11-16 DIAGNOSIS — E038 Other specified hypothyroidism: Secondary | ICD-10-CM

## 2019-11-16 LAB — COMPREHENSIVE METABOLIC PANEL
ALT: 10 U/L (ref 0–44)
AST: 16 U/L (ref 15–41)
Albumin: 2.9 g/dL — ABNORMAL LOW (ref 3.5–5.0)
Alkaline Phosphatase: 72 U/L (ref 38–126)
Anion gap: 10 (ref 5–15)
BUN: 21 mg/dL (ref 8–23)
CO2: 29 mmol/L (ref 22–32)
Calcium: 8.8 mg/dL — ABNORMAL LOW (ref 8.9–10.3)
Chloride: 99 mmol/L (ref 98–111)
Creatinine, Ser: 1.55 mg/dL — ABNORMAL HIGH (ref 0.61–1.24)
GFR calc Af Amer: 49 mL/min — ABNORMAL LOW (ref >60)
GFR calc non Af Amer: 42 mL/min — ABNORMAL LOW (ref >60)
Glucose, Bld: 112 mg/dL — ABNORMAL HIGH (ref 70–99)
Potassium: 3.2 mmol/L — ABNORMAL LOW (ref 3.5–5.1)
Sodium: 138 mmol/L (ref 135–145)
Total Bilirubin: 0 mg/dL — ABNORMAL LOW (ref 0.3–1.2)
Total Protein: 6.2 g/dL — ABNORMAL LOW (ref 6.5–8.1)

## 2019-11-16 LAB — MAGNESIUM: Magnesium: 1.8 mg/dL (ref 1.7–2.4)

## 2019-11-16 LAB — PROTIME-INR
INR: 1.2 (ref 0.8–1.2)
Prothrombin Time: 14.8 seconds (ref 11.4–15.2)

## 2019-11-16 MED ORDER — PROCHLORPERAZINE EDISYLATE 10 MG/2ML IJ SOLN
10.0000 mg | Freq: Four times a day (QID) | INTRAMUSCULAR | Status: DC | PRN
  Administered 2019-11-16: 10 mg via INTRAVENOUS
  Filled 2019-11-16: qty 2

## 2019-11-16 MED ORDER — CARVEDILOL 3.125 MG PO TABS
6.2500 mg | ORAL_TABLET | Freq: Two times a day (BID) | ORAL | Status: DC
  Administered 2019-11-16 – 2019-11-18 (×4): 6.25 mg via ORAL
  Filled 2019-11-16 (×4): qty 2

## 2019-11-16 MED ORDER — MORPHINE SULFATE (PF) 2 MG/ML IV SOLN
2.0000 mg | Freq: Once | INTRAVENOUS | Status: AC
  Administered 2019-11-16: 2 mg via INTRAVENOUS
  Filled 2019-11-16: qty 1

## 2019-11-16 MED ORDER — POTASSIUM CHLORIDE CRYS ER 20 MEQ PO TBCR
40.0000 meq | EXTENDED_RELEASE_TABLET | Freq: Once | ORAL | Status: AC
  Administered 2019-11-16: 40 meq via ORAL
  Filled 2019-11-16: qty 2

## 2019-11-16 MED ORDER — TRAZODONE HCL 50 MG PO TABS
50.0000 mg | ORAL_TABLET | Freq: Once | ORAL | Status: AC
  Administered 2019-11-16: 21:00:00 50 mg via ORAL
  Filled 2019-11-16: qty 1

## 2019-11-16 MED ORDER — PANTOPRAZOLE SODIUM 40 MG PO TBEC
40.0000 mg | DELAYED_RELEASE_TABLET | Freq: Two times a day (BID) | ORAL | Status: DC
  Administered 2019-11-16 – 2019-11-18 (×4): 40 mg via ORAL
  Filled 2019-11-16 (×4): qty 1

## 2019-11-16 MED ORDER — METOCLOPRAMIDE HCL 10 MG PO TABS
5.0000 mg | ORAL_TABLET | Freq: Two times a day (BID) | ORAL | Status: DC
  Administered 2019-11-16 – 2019-11-18 (×4): 5 mg via ORAL
  Filled 2019-11-16 (×4): qty 1

## 2019-11-16 MED ORDER — GABAPENTIN 300 MG PO CAPS: 300.0000 mg | ORAL_CAPSULE | Freq: Three times a day (TID) | ORAL | Status: DC | PRN

## 2019-11-16 NOTE — Consult Note (Addendum)
Cardiology Consultation:   Patient ID: EDDRICK DILONE MRN: [id number]; DOB: 1941-04-26  Admit date: 11/14/2019 Date of Consult: 11/16/2019  Primary Care Provider: Monico Blitz, MD Primary Cardiologist: No primary care provider on file. new, never followed up after stent 2018 Primary Electrophysiologist:  None    Patient Profile:   DELAN KSIAZEK is a 78 y.o. male with a hx of Bradycardia, atrial fib,  CKD, Rt eye blindness, chronic cirrhosis with portal gastropathy, secondary to NASH/ETOH, chronic diastolic HF and CAD with stent to ostial LAD 2018, and chronic RCA disease turned down for CABG  who is being seen today for the evaluation of chest pain at the request of Dr. Wynetta Emery.  History of Present Illness:   Mr. Thall with above hx and history of CKD with last documented creatinine in 2016 between 2-2.5. 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 History of moonshine use. On chronic NSAIDs. Was admitted in 2014 with acute diastolic CHF. Was in Afib. Not felt to be a candidate for anticoagulation due to cirrhosis and Etoh use. Echo showed severe LVH and normal EF. Outpatient follow up with cardiology recommended but no records concerning this. He did have a cardiac cath in 2003 showing diffuse coronary ectasia but no obstructive disease. Past history also indicates prior TIA, HTN, severe arthritis, and remote portal vein thrombosis.      In 2018  With NSTEMI (05/01/2017) he had cardiac cath with ostial LAD with 85% stenosis, apparently thrombotic with TIMI 2 flow distally; 1st Diag lesion(major trunk of diagonal), 100 %stenosed.1st Mrg lesion, 100 %stenosed.Extensively diseased RCA that is a very large caliber vessel/ectatic with extensive thrombus in the proximal to mid portion with reduced flow distallyProx RCA to Mid RCA lesion, 75 %stenosed. heavily thrombotic, ulcerated irregular vessel  Mid RCA to Dist RCA lesion, 60 %stenosed. Dist RCA to  tandem lesions, 70 %stenosed.  Post Atrio lesion, 80 %stenosed - also appears to be ulcerated and thrombotic with some ectatic segments.  No aortic valve stenosis and LV end diastolic pressure is mildly elevated.   Dr. Servando Snare was consulted for CABG and he felt pt not a candidate for CABG with numerous medical problems - suggested PCI to LAD and possible RCA.    05/03/17 pt underwent successful atherectomy based PCI of ostial LAD landing a stent just within the Left Main but not encroaching upon the Circumflex.  The stent was postdilated up to 5.1 mm..  Plan was for medical therapy of occluded OM Midas Daughety and diagonal Dimarco Minkin.   Dr. Ellyn Hack was unable to engage there RCA - plan to re-evaluate at later date.  Would need ASA/plavix for 1 year.   Plan to treat his chronic a fib with DAPT alone with high risk of bleeding.    I do not find any cardiology follow up.    Admitted 11/14/19 with chest pain and vomiting.  He had sharp pain on the 25th and 26th.  He originally called EMS and with 4 sl NTG pain resolved.   He has some upper abd pain along with emesis.    Difficult historian, but no chest pain until Thursday prior to admit.  + vomiting with pain.    EKG:  The EKG was personally reviewed and demonstrates:  A fib rate controlled at 95, incomplete LBBB and PVCs no acute ST changes and no EKG changes since 02/17/18 Telemetry:  Telemetry was personally reviewed and demonstrates:  A fib and 4 beats NSVT.    Troponin hs 19;  20 Na 138, K+ 3.2, Mg up from 1.3 to 1.8, Cr 1.55 albumin 2.9  WB 11.2, Hgb 13.7, plts 175  COVID neg   CXR with sm lt pl effusion  BP 101/76 P 84   outpt meds plavix alone no ASA.   Heart Pathway Score:     Past Medical History:  Diagnosis Date  . Adenomatous polyp of colon   . 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 Arthritis   . Atrial fibrillation ([place])   . Blindness of right eye 1997  . CAD (coronary artery disease)    Multivessel disease at cardiac catheterization June 2018,  DES to LAD June 2018 with significant residual disease including diagonal and obtuse marginal vessels as well as RCA  . Cirrhosis of liver ([facility])    Associated portal gastropathy  . CKD (chronic kidney disease) stage 3, GFR 30-59 ml/min   . Essential hypertension   . Hiatal hernia   . Hypothyroidism   . 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 TIA (transient ischemic attack)     Past Surgical History:  Procedure Laterality Date  . BIOPSY  12/23/2017   Procedure: BIOPSY;  Surgeon: Daneil Dolin, MD;  [facility];  Service: Endoscopy;;  esophagus  . CARDIAC CATHETERIZATION  2003  . COLONOSCOPY  08/2008   normal, repeat exam in 5-7 years  . COLONOSCOPY  2004   rectal adenomatous polyp  . COLONOSCOPY N/A 12/01/2014   XBL:[id number] rectum/elongated colon  . CORONARY ATHERECTOMY N/A 05/03/2017   Procedure: Coronary Atherectomy;  Surgeon: Leonie Man, MD;  Location: [place] [facility];  Service: Cardiovascular;  Laterality: N/A;  . CORONARY STENT INTERVENTION N/A 05/03/2017   Procedure: Coronary Stent Intervention;  Surgeon: Leonie Man, MD;  Location: [place] [facility];  Service: Cardiovascular;  Laterality: N/A;  . ESOPHAGEAL DILATION N/A 02/07/2015   Procedure: ESOPHAGEAL DILATION;  Surgeon: Daneil Dolin, MD;  [facility];  Service: Endoscopy;  Laterality: N/A;  . ESOPHAGEAL DILATION N/A 08/17/2017   Procedure: ESOPHAGEAL DILATION;  Surgeon: Danie Binder, MD;  [facility];  Service: Endoscopy;  Laterality: N/A;  . ESOPHAGEAL DILATION N/A 09/23/2017   Procedure: ESOPHAGEAL DILATION;  Surgeon: Danie Binder, MD;  [facility];  Service: Endoscopy;  Laterality: N/A;  . ESOPHAGEAL MANOMETRY N/A 01/10/2018   Procedure: ESOPHAGEAL MANOMETRY (EM);  Surgeon: Danie Binder, MD;  [facility];  Service: Endoscopy;  Laterality: N/A;  . ESOPHAGOGASTRODUODENOSCOPY  02/11/2012   Dr. Gala Romney: portal gastropathy, gastric erosions, esophageal  ulcerations likely pill-induced, surveillance in 2 years  . ESOPHAGOGASTRODUODENOSCOPY N/A 12/01/2014   Dr. Volney American esophageal stricture dilated with the scope passage, portal gastropathy, negative H.pylori on gastric biopsies, esophageal biopsies benign  . ESOPHAGOGASTRODUODENOSCOPY N/A 02/07/2015   Procedure: ESOPHAGOGASTRODUODENOSCOPY (EGD);  Surgeon: Daneil Dolin, MD;  [facility];  Service: Endoscopy;  Laterality: N/A;  1115  . ESOPHAGOGASTRODUODENOSCOPY N/A 08/17/2017   benign-appearing esophageal stenosis s/p dilation, mild gastritis, pylorus stenosis s/p dilation  . ESOPHAGOGASTRODUODENOSCOPY N/A 09/23/2017   moderate benign-appearing instrinsic stenosis s/p dilation, mild gastritis  . ESOPHAGOGASTRODUODENOSCOPY N/A 10/18/2017   Benign-appearing esophageal stricture s/p dilation, gastritis, benign-appearing intrinsice moderate pylorus stenosis s/p dilation  . ESOPHAGOGASTRODUODENOSCOPY (EGD) WITH PROPOFOL N/A 12/23/2017   Dr. Gala Romney: single short Grade 2 varix mid esophagus, just above GE junction was nearly circumferential denuding ulcerations/loss of normal mucosal appearance (somewhat punched out) mucosa. Query pill induced injury with path negative for  malignancy. Portal hypertensive gastropathy, patent pylorus, normal duodenal bulb and second portion of duodenum, no dilation performed.   Marland Kitchen EYE SURGERY     RIGHT EYE REMOVED  . JOINT REPLACEMENT    . LEFT HEART CATH AND CORONARY ANGIOGRAPHY N/A 05/01/2017   Procedure: Left Heart Cath and Coronary Angiography;  Surgeon: Leonie Man, MD;  Location: [place] [facility];  Service: Cardiovascular;  Laterality: N/A;  . Venia Minks DILATION N/A 10/18/2017   Procedure: Venia Minks DILATION;  Surgeon: Danie Binder, MD;  [facility];  Service: Endoscopy;  Laterality: N/A;  . s/p eye implant  1997   artificial eye, right  . SAVORY DILATION N/A 10/18/2017   Procedure: SAVORY DILATION;  Surgeon: Danie Binder, MD;   [facility];  Service: Endoscopy;  Laterality: N/A;  . TOTAL HIP ARTHROPLASTY  2002  . TOTAL HIP ARTHROPLASTY  2006/2012   revision in 2012  . TOTAL KNEE ARTHROPLASTY  1999/2003   left/right     Home Medications:  Prior to Admission medications   Medication Sig Start Date End Date Taking? Authorizing Provider  allopurinol (ZYLOPRIM) 300 MG tablet Take 300 mg by mouth daily. 09/15/19  Yes [provider]  atorvastatin (LIPITOR) 80 MG tablet Take 80 mg by mouth daily at 6 PM.  09/12/17  Yes [provider]  clopidogrel (PLAVIX) 75 MG tablet Take 1 tablet (75 mg total) by mouth daily. 05/07/17  Yes Barton Dubois, MD  diclofenac (VOLTAREN) 75 MG EC tablet Take 75 mg by mouth 2 (two) times daily. 11/06/19  Yes [provider]  doxycycline (VIBRAMYCIN) 100 MG capsule Take 1 capsule (100 mg total) by mouth 2 (two) times daily. 11/11/19  Yes Noemi Chapel, MD  gabapentin (NEURONTIN) 300 MG capsule Take 300 mg by mouth 3 (three) times daily as needed. 10/10/19  Yes [provider]  levothyroxine (SYNTHROID) 75 MCG tablet Take 75 mcg by mouth daily before breakfast.  09/12/17  Yes [provider]  metoCLOPramide (REGLAN) 5 MG tablet 1 po 30 minutes prior to meals bid Patient taking differently: Take 5 mg by mouth 2 (two) times daily before a meal. 1 po 30 minutes prior to meals bid 10/18/17  Yes Fields, Sandi L, MD  pantoprazole (PROTONIX) 40 MG tablet Take 40 mg by mouth 2 (two) times daily. 10/22/19  Yes [provider]  torsemide (DEMADEX) 20 MG tablet Take 20 mg by mouth daily.    Yes [provider]  traMADol (ULTRAM) 50 MG tablet Take 1 tablet (50 mg total) by mouth every 6 (six) hours as needed. Patient taking differently: Take 50 mg by mouth 3 (three) times daily.  08/18/17  Yes Tat, Shanon Brow, MD  allopurinol (ZYLOPRIM) 100 MG tablet Take 1 tablet (100 mg total) by mouth daily. Patient not taking: Reported on 11/15/2019  08/19/17   Orson Eva, MD  carvedilol (COREG) 3.125 MG tablet Take 1 tablet by mouth 2 (two) times daily. 12/20/17   [provider]  Cholecalciferol (VITAMIN D3) 2000 units CHEW Chew by mouth daily.    [provider]  diltiazem (CARDIZEM CD) 120 MG 24 hr capsule Take 1 capsule (120 mg total) by mouth daily. 02/20/18 02/20/19  Isaac Bliss, Rayford Halsted, MD  folic acid (FOLVITE) 1 MG tablet Take 1 tablet (1 mg total) by mouth daily. Patient not taking: Reported on 11/15/2019 05/07/17   Barton Dubois, MD  polyethylene glycol [facility] / Floria Raveling) packet Take 17 g daily by mouth. Patient  not taking: Reported on 11/15/2019 09/25/17   Kathie Dike, MD    Inpatient Medications: Scheduled Meds: . allopurinol  100 mg Oral Daily  . aspirin  81 mg Oral Daily  . atorvastatin  80 mg Oral q1800  . carvedilol  3.125 mg Oral BID WC  . cholecalciferol  1,000 Units Oral Daily  . clopidogrel  75 mg Oral Daily  . doxycycline  100 mg Oral BID  . folic acid  1 mg Oral Daily  . levothyroxine  75 mcg Oral QAC breakfast  . pantoprazole  40 mg Oral Daily  . potassium chloride  20 mEq Oral Daily  . potassium chloride  40 mEq Oral Once  . torsemide  20 mg Oral Daily   Continuous Infusions:  PRN Meds: acetaminophen **OR** acetaminophen  Allergies:   No Known Allergies  Social History:   Social History   Socioeconomic History  . Marital status: Divorced    Spouse name: Not on file  . Number of children: Not on file  . Years of education: Not on file  . Highest education level: Not on file  Occupational History  . Not on file  Tobacco Use  . Smoking status: Never Smoker  . Smokeless tobacco: Never Used  . Tobacco comment: used to chew tobacco, none in 15 years  Substance and Sexual Activity  . Alcohol use: Yes    Alcohol/week: 0.0 standard drinks    Comment: occ beer  . Drug use: No  . Sexual activity: Not Currently  Other Topics Concern  . Not on file  Social History  Narrative   One son deceased while in prison, drug addiction.   Social Determinants of Health   Financial Resource Strain:   . Difficulty of Paying Living Expenses: Not on file  Food Insecurity:   . Worried About Charity fundraiser in the Last Year: Not on file  . Ran Out of Food in the Last Year: Not on file  Transportation Needs:   . Lack of Transportation (Medical): Not on file  . Lack of Transportation (Non-Medical): Not on file  Physical Activity:   . Days of Exercise per Week: Not on file  . Minutes of Exercise per Session: Not on file  Stress:   . Feeling of Stress : Not on file  Social Connections:   . Frequency of Communication with Friends and Family: Not on file  . Frequency of Social Gatherings with Friends and Family: Not on file  . Attends Religious Services: Not on file  . Active Member of Clubs or Organizations: Not on file  . Attends Archivist Meetings: Not on file  . Marital Status: Not on file  Intimate Partner Violence:   . Fear of Current or Ex-Partner: Not on file  . Emotionally Abused: Not on file  . Physically Abused: Not on file  . Sexually Abused: Not on file    Family History:    Family History  Problem Relation Age of Onset  . Ovarian cancer Sister   . Colon cancer Neg Hx      ROS:  Please see the history of present illness.  General:no colds or fevers, no weight changes Skin:no rashes or ulcers HEENT:no blurred vision, + blind in Rt eye, no congestion CV:see HPI PUL:see HPI GI:no diarrhea constipation or melena, no indigestion GU:no hematuria, no dysuria MS:no joint pain, no claudication Neuro:no syncope, no lightheadedness Endo:no diabetes, + thyroid disease on synthroid.   All other ROS reviewed and negative.  Physical Exam/Data:   Vitals:   11/15/19 1944 11/15/19 1947 11/16/19 0451 11/16/19 0819  BP:   101/76 126/86  Pulse:   82 86  Resp:   18   Temp:   98.1 F (36.7 C)   TempSrc:      SpO2: 97%  97%     Weight:  88.4 kg    Height:  5' 8"  (1.727 m)      Intake/Output Summary (Last 24 hours) at 11/16/2019 0859 Last data filed at 11/16/2019 0400 Gross per 24 hour  Intake 240 ml  Output --  Net 240 ml   Last 3 Weights 11/15/2019 11/14/2019 11/11/2019  Weight (lbs) 194 lb 14.2 oz 250 lb 250 lb  Weight (kg) 88.4 kg 113.4 kg 113.399 kg     Body mass index is 29.63 kg/m.  General:  Well nourished, well developed, in no acute distress, had vomited this AM, had chest pain prior then pain resolved.  HEENT: normal Lymph: no adenopathy Neck: no JVD Endocrine:  No thryomegaly Vascular: No carotid bruits; pedal pulses 2+ bilaterally Cardiac:  normal S1, S2; RRR; no murmur, gallup rub or click  Lungs:  clear to diminished to auscultation bilaterally, no wheezing, rhonchi or rales , + coarse cough, non productive Abd: soft, nontender, no hepatomegaly  Ext: no edema Musculoskeletal:  No deformities, BUE and BLE strength normal and equal Skin: warm and dry  Neuro:  Poor historian, oriented X 3 MAE follows commands, no focal abnormalities noted Psych:  Normal affect    Relevant CV Studies: Cath 05/01/17 Left Heart Cath and Coronary Angiography  Conclusion     Ost LAD lesion, 85 %stenosed - apparently thrombotic with TIMI 2 flow distally  1st Diag lesion(major trunk of diagonal), 100 %stenosed.  1st Mrg lesion, 100 %stenosed.  Extensively diseased RCA that is a very large caliber vessel/ectatic with extensive thrombus in the proximal to mid portion with reduced flow distally  Prox RCA to Mid RCA lesion, 75 %stenosed. heavily thrombotic, ulcerated irregular vessel  Mid RCA to Dist RCA lesion, 60 %stenosed. Dist RCA to tandem lesions, 70 %stenosed.  Post Atrio lesion, 80 %stenosed - also appears to be ulcerated and thrombotic with some ectatic segments.  HEMODYNAMICS  LV end diastolic pressure is mildly elevated.  There is no aortic valve stenosis.  The patient is extensive,  diffuse thrombotic disease throughout the entire RCA is a very focal eccentric ostial LAD lesion. The 2 occluded vessels being diagonal Jeffie Spivack and OM are not very large caliber vessels, however diagonal Laveda Demedeiros does appear to have the potential cover large area of distribution.  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 He would not be a good candidate for PCI as this would require extensive stents, and elevate the LAD lesion is amenable to PCI. I will restart IV heparin for atrial fibrillation (I think were too far out to start Aggrastat).   The case was discussed with Dr. Debara Pickett who will contact CT surgery for consultation to determine his appropriateness for CABG   Coronary Stent Intervention  05/03/17  Coronary Atherectomy  Conclusion     Ost LAD lesion, 85 %stenosed.  A STENT SYNERGY DES 4X12 drug eluting stent was successfully placed. Post-dilated to 5.1 mm  Post intervention, there is a 0% residual stenosis.  1st Diag lesion, 100 %stenosed. unable to cross with wire  1st Mrg lesion, 100 %stenosed.  Severely elevated LVEDP of 36  mmHg  Successful atherectomy based PCI of ostial LAD landing a stent just within the Left Main but not encroaching upon the Circumflex. The stent was postdilated up to 5.1 mm..  The patient required significant amount of contrast simply because of his body habitus and poor visibility trying to place an ostial stent.  An attempt was made to wire the occluded Jaksen Fiorella of the first diagonal. The guidewire would not cross. However there did appear to be some faint filling further down than the original occlusion site on post-PCI angiography.  Plan: The patient is being transferred to 6 Central post procedure unit for TR band removal and ongoing monitoring. I'll write for least one dose of IV Lasix to be given this  evening. He may require further dosing based on his elevated EDP. He will be given gentle hydration given his CK D.  Plan for now will be to treat the occluded OM Thyra Yinger and diagonal Kashonda Sarkisyan medically. I was unable to engage the RCA which was the initial plan. This can be evaluated at a later date.  He will need to be on aspirin plus Plavix for at least one year. Would not do triple therapy based on his bleeding risk.   Diagnostic Diagram       Post-Intervention Diagram         Echo 2019  Study Conclusions  - Left ventricle: The cavity size was normal. Wall thickness was   increased in a pattern of moderate to severe LVH. Systolic   function was normal. The estimated ejection fraction was in the   range of 55% to 60%. - Aortic valve: Cusp separation was mildly reduced. There was mild   regurgitation. - Mitral valve: There was mild regurgitation. - Left atrium: The atrium was severely dilated. - Pulmonary arteries: PA peak pressure: 46 mm Hg (S). - Impressions: Study limited by very poor acoustic windows.  Impressions:  - Study limited by very poor acoustic windows.  Echo 11/15/19  IMPRESSIONS    1. Left ventricular ejection fraction, by visual estimation, is 45 to 50%. The left ventricle has mildly decreased function. There is severely increased left ventricular hypertrophy.  2. Mild hypokinesis of the left ventricular, basal-mid anterolateral wall and inferolateral wall.  3. Left ventricular diastolic function could not be evaluated.  4. The left ventricle demonstrates regional wall motion abnormalities.  5. Global right ventricle has normal systolic function.The right ventricular size is normal. No increase in right ventricular wall thickness.  6. Left atrial size was moderately dilated.  7. Right atrial size was normal.  8. Mild calcification of the anterior mitral valve leaflet(s).  9. Mild thickening of the anterior mitral valve leaflet(s). 10. The  mitral valve is degenerative. Mild to moderate mitral valve regurgitation. 11. The tricuspid valve is normal in structure. 12. Aortic valve regurgitation is mild to moderate. 13. The aortic valve is tricuspid. Aortic valve regurgitation is mild to moderate. Mild to moderate aortic valve sclerosis/calcification without any evidence of aortic stenosis. 14. The pulmonic valve was not well visualized. Pulmonic valve regurgitation is not visualized. 15. Mildly elevated pulmonary artery systolic pressure. 16. The tricuspid regurgitant velocity is 2.89 m/s, and with an assumed right atrial pressure of 3 mmHg, the estimated right ventricular systolic pressure is mildly elevated at 36.4 mmHg. 17. There is a new lateral wall motion abnormality compared to 04/30/2017. 18. Prior images reviewed side by side.  FINDINGS  Left Ventricle: Left ventricular ejection fraction, by visual estimation, is 45 to 50%. The  left ventricle has mildly decreased function. Mild hypokinesis of the left ventricular, basal-mid anterolateral wall and inferolateral wall. The left ventricle  demonstrates regional wall motion abnormalities. There is severely increased left ventricular hypertrophy. Concentric left ventricular hypertrophy. The left ventricular diastology could not be evaluated due to atrial fibrillation. Left ventricular  diastolic function could not be evaluated.  Right Ventricle: The right ventricular size is normal. No increase in right ventricular wall thickness. Global RV systolic function is has normal systolic function. The tricuspid regurgitant velocity is 2.89 m/s, and with an assumed right atrial pressure  of 3 mmHg, the estimated right ventricular systolic pressure is mildly elevated at 36.4 mmHg.  Left Atrium: Left atrial size was moderately dilated.  Right Atrium: Right atrial size was normal in size  Pericardium: There is no evidence of pericardial effusion.  Mitral Valve: The mitral valve is  degenerative in appearance. There is mild thickening of the anterior mitral valve leaflet(s). There is mild calcification of the anterior mitral valve leaflet(s). Mild to moderate mitral valve regurgitation, with  centrally-directed jet.  Tricuspid Valve: The tricuspid valve is normal in structure. Tricuspid valve regurgitation is mild.  Aortic Valve: The aortic valve is tricuspid. . There is moderate thickening and moderate calcification of the aortic valve. Aortic valve regurgitation is mild to moderate. Mild to moderate aortic valve sclerosis/calcification is present, without any  evidence of aortic stenosis. There is moderate thickening of the aortic valve. There is moderate calcification of the aortic valve.  Pulmonic Valve: The pulmonic valve was not well visualized. Pulmonic valve regurgitation is not visualized. Pulmonic regurgitation is not visualized.  Aorta: The aortic root and ascending aorta are structurally normal, with no evidence of dilitation.  IAS/Shunts: No atrial level shunt detected by color flow Doppler.   Laboratory Data:  High Sensitivity Troponin:   Recent Labs  Lab 11/14/19 1458 11/14/19 1730  TROPONINIHS 19* 20*     Chemistry Recent Labs  Lab 11/14/19 1458 11/16/19 0005  NA 139 138  K 3.0* 3.2*  CL 100 99  CO2 27 29  GLUCOSE 105* 112*  BUN 20 21  CREATININE 1.76* 1.55*  CALCIUM 8.9 8.8*  GFRNONAA 36* 42*  GFRAA 42* 49*  ANIONGAP 12 10    Recent Labs  Lab 11/14/19 1458 11/16/19 0005  PROT 6.4* 6.2*  ALBUMIN 3.0* 2.9*  AST 16 16  ALT 10 10  ALKPHOS 78 72  BILITOT 1.0 0.0*   Hematology Recent Labs  Lab 11/14/19 1458  WBC 11.2*  RBC 4.42  HGB 13.7  HCT 42.6  MCV 96.4  MCH 31.0  MCHC 32.2  RDW 15.2  PLT 175   BNPNo results for input(s): BNP, PROBNP in the last 168 hours.  DDimer No results for input(s): DDIMER in the last 168 hours.   Radiology/Studies:  DG Chest 2 View  Result Date: 11/14/2019 CLINICAL DATA:   Chest pain. Vomiting. EXAM: CHEST - 2 VIEW COMPARISON:  Chest x-rays dated 11/11/2019 02/16/2018 and 01/05/2018 FINDINGS: There is a small left pleural effusion. Secondary atelectasis at the left lung base. Tiny right effusion. Heart size and vascularity are normal. No significant bone abnormality. IMPRESSION: 1. Small left pleural effusion with slight secondary atelectasis at the left lung base, unchanged since the prior study of 11/11/2019. 2. Tiny right effusion, unchanged. Electronically Signed   By: Lorriane Shire M.D.   On: 11/14/2019 15:05   ECHOCARDIOGRAM COMPLETE  Result Date: 11/15/2019   ECHOCARDIOGRAM REPORT   Patient Name:   GEOVANI  Luz Brazen Date of Exam: 11/15/2019 Medical Rec #:  [id number]            Height:       74.0 in Accession #:    [id number]           Weight:       250.0 lb Date of Birth:  1941-11-14            BSA:          2.39 m Patient Age:    1 years             BP:           105/68 mmHg Patient Gender: M                    HR:           93 bpm. Exam Location:  [facility] Procedure: 2D Echo, Cardiac Doppler and Color Doppler Indications:    Chest Pain 786.50 / R07.9  History:        Patient has prior history of Echocardiogram examinations, most                 recent 02/16/2018. CHF, CAD and Previous Myocardial Infarction;                 Arrythmias:Atrial Fibrillation. Alcoholic cirrhosis of liver                 without ascites.  Sonographer:    Alvino Chapel RCS Referring Phys: [id number] [place]  1. Left ventricular ejection fraction, by visual estimation, is 45 to 50%. The left ventricle has mildly decreased function. There is severely increased left ventricular hypertrophy.  2. Mild hypokinesis of the left ventricular, basal-mid anterolateral wall and inferolateral wall.  3. Left ventricular diastolic function could not be evaluated.  4. The left ventricle demonstrates regional wall motion abnormalities.  5. Global right ventricle has normal systolic  function.The right ventricular size is normal. No increase in right ventricular wall thickness.  6. Left atrial size was moderately dilated.  7. Right atrial size was normal.  8. Mild calcification of the anterior mitral valve leaflet(s).  9. Mild thickening of the anterior mitral valve leaflet(s). 10. The mitral valve is degenerative. Mild to moderate mitral valve regurgitation. 11. The tricuspid valve is normal in structure. 12. Aortic valve regurgitation is mild to moderate. 13. The aortic valve is tricuspid. Aortic valve regurgitation is mild to moderate. Mild to moderate aortic valve sclerosis/calcification without any evidence of aortic stenosis. 14. The pulmonic valve was not well visualized. Pulmonic valve regurgitation is not visualized. 15. Mildly elevated pulmonary artery systolic pressure. 16. The tricuspid regurgitant velocity is 2.89 m/s, and with an assumed right atrial pressure of 3 mmHg, the estimated right ventricular systolic pressure is mildly elevated at 36.4 mmHg. 17. There is a new lateral wall motion abnormality compared to 04/30/2017. 18. Prior images reviewed side by side. FINDINGS  Left Ventricle: Left ventricular ejection fraction, by visual estimation, is 45 to 50%. The left ventricle has mildly decreased function. Mild hypokinesis of the left ventricular, basal-mid anterolateral wall and inferolateral wall. The left ventricle demonstrates regional wall motion abnormalities. There is severely increased left ventricular hypertrophy. Concentric left ventricular hypertrophy. The left ventricular diastology could not be evaluated due to atrial fibrillation. Left ventricular diastolic function could not be evaluated. Right Ventricle: The right ventricular size is normal. No increase in right ventricular wall thickness. Global RV  systolic function is has normal systolic function. The tricuspid regurgitant velocity is 2.89 m/s, and with an assumed right atrial pressure  of 3 mmHg, the estimated  right ventricular systolic pressure is mildly elevated at 36.4 mmHg. Left Atrium: Left atrial size was moderately dilated. Right Atrium: Right atrial size was normal in size Pericardium: There is no evidence of pericardial effusion. Mitral Valve: The mitral valve is degenerative in appearance. There is mild thickening of the anterior mitral valve leaflet(s). There is mild calcification of the anterior mitral valve leaflet(s). Mild to moderate mitral valve regurgitation, with centrally-directed jet. Tricuspid Valve: The tricuspid valve is normal in structure. Tricuspid valve regurgitation is mild. Aortic Valve: The aortic valve is tricuspid. . There is moderate thickening and moderate calcification of the aortic valve. Aortic valve regurgitation is mild to moderate. Mild to moderate aortic valve sclerosis/calcification is present, without any evidence of aortic stenosis. There is moderate thickening of the aortic valve. There is moderate calcification of the aortic valve. Pulmonic Valve: The pulmonic valve was not well visualized. Pulmonic valve regurgitation is not visualized. Pulmonic regurgitation is not visualized. Aorta: The aortic root and ascending aorta are structurally normal, with no evidence of dilitation. IAS/Shunts: No atrial level shunt detected by color flow Doppler.  LEFT VENTRICLE PLAX 2D LVIDd:         4.39 cm LVIDs:         2.94 cm LV PW:         1.45 cm LV IVS:        1.80 cm LVOT diam:     1.90 cm LV SV:         54 ml LV SV Index:   21.89 LVOT Area:     2.84 cm  LV Volumes (MOD) LV area d, A2C:    25.95 cm LV area d, A4C:    28.00 cm LV area s, A2C:    17.70 cm LV area s, A4C:    18.30 cm LV major d, A2C:   6.84 cm LV major d, A4C:   6.63 cm LV major s, A2C:   5.90 cm LV major s, A4C:   6.26 cm LV vol d, MOD A2C: 83.2 ml LV vol d, MOD A4C: 97.1 ml LV vol s, MOD A2C: 48.2 ml LV vol s, MOD A4C: 47.3 ml LV SV MOD A2C:     35.0 ml LV SV MOD A4C:     97.1 ml LV SV MOD BP:      42.2 ml RIGHT  VENTRICLE TAPSE (M-mode): 1.5 cm LEFT ATRIUM              Index       RIGHT ATRIUM           Index LA diam:        4.00 cm  1.67 cm/m  RA Area:     20.10 cm LA Vol (A2C):   104.0 ml 43.52 ml/m RA Volume:   52.60 ml  22.01 ml/m LA Vol (A4C):   87.3 ml  36.53 ml/m LA Biplane Vol: 102.0 ml 42.68 ml/m  AORTIC VALVE LVOT Vmax:   93.25 cm/s LVOT Vmean:  57.700 cm/s LVOT VTI:    0.157 m  AORTA Ao Root diam: 3.50 cm MITRAL VALVE                        TRICUSPID VALVE MV Area (PHT): 4.49 cm  TR Peak grad:   33.4 mmHg MV PHT:        49.01 msec           TR Vmax:        289.00 cm/s MV Decel Time: 169 msec MV E velocity: 133.00 cm/s 103 cm/s SHUNTS                                     Systemic VTI:  0.16 m                                     Systemic Diam: 1.90 cm  Dani Gobble Croitoru MD Electronically signed by Sanda Klein MD Signature Date/Time: 11/15/2019/4:36:58 PM    Final        HEAR Score (for undifferentiated chest pain):   5  Assessment and Plan:   1. Chest pain and epigastric pain, with unchanged EKG and troponin hs 20.  Pt with significant CAD and stent to ostial LAD and unable to intervene on RCA disease.  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   Possible nuc study to eval if cardiac vs cardiac cath .   2. CAD significant disease see above.  On statin, high dose, plavix   And now ASA added.  On Echo There is a new lateral wall motion abnormality compared to 04/30/2017. 3. LV dysfunction on Echo 45-50% down from 55-60%   Severe LVH  4. Chronic diastolic HF on torsemide 20 daily.  5. CKD monitor  6. Prolonged Qtc and low Mg+ this was replaced.   7. Hypokalemia - replace       For questions or updates, please contact [place] Please consult [email or web address] for contact info under     Signed, Cecilie Kicks, NP  11/16/2019 8:59 AM   Attending note  Patient seen and discussed with NP Dorene Ar, I agree with her documentation. 78 yo male history of CAD with cath 04/2017 with  ostial LAD disease, D1 occluded, OM1 occluded. RCA large/ectatic with extensive thrombus. Seen by surgery, thought to not a CABG candidate.  He received PCI of ostial LAD. Could not cross the occluded D1 with wire. Remaining disease was treated medically. Also history of afib, CKD 3, liver cirrhosis with portal gastopathy, EtOH abuse.   He has not followed up with cardiology since his 04/2017 admission  Presents with chest pain/epigastric pain. +nausea and vomiting. Burning pain can last several hours at a time, worst with eating. No associated SOB.    K 3 Cr 1.76 BUN 20 WBC 11.2 Hgb 13.7 Plt 175 Lipase 21 Mg 1.3  Hs trop 19-->20 COVID neg CXR small pleural effusions EKG afib, nonspecific ST/T chagnes Echo LVEF 45-50%, mild hypokinesis of the basal mid anterolateral and inferolateral walls. Mild to mod MR.   His current symptoms of epigastric/midchest burning lasting hours at a time worst with eating is not consistent with a cardiac etiology of his pain. Trops are not impressive, EKG is not specific for ischemic changes. I will review echo images to see if true change in wall motion, however these could be subacute from his known prior residual coronary disease.  With his kidney disease, liver disease, and poor follow up history would have higher threshold to consider invasive testing in general. With his symptoms being noncardiac in description would not plan of ischemic  testing at this time. Continue medical  therapy with ASA, atrova 80, coreg 3.170m bid. Can stop plavix. Would consider a GI evaluation. Previously deemed not an anticoag candidate for his afib. Some elevated rates at times, increase coreg to 6.227mbid.      JoCarlyle DollyD

## 2019-11-16 NOTE — Progress Notes (Signed)
PROGRESS NOTE [facility]  Justin Parsons  JJH:[id number]  DOB: [birth year]  DOA: 11/14/2019 PCP: Monico Blitz, MD  Brief Admission Hx: 78 year old male with extensive cardiac history, history of alcohol abuse, liver cirrhosis, portal vein thrombosis, chronic A. fib, right eye blindness, history of NSTEMI, CKD, hypertension, hypothyroidism, history of TIA, hiatal hernia presented to the ED with complaints of sharp chest pains and epigastric pains dizziness and nausea.  MDM/Assessment & Plan:   1. Atypical chest pain symptoms-his symptoms seem more epigastric and upper GI.  He was started on Protonix and given some medications for heartburn relief however he continues to complain of the pain.  He is nauseated and vomiting.  He has been seen by cardiology and they do not feel that this is a reactive issue.  His troponins have been fairly flat and not impressive.  Cardiology recommended a GI consult which I agree as he could have a need for upper endoscopy.  I have asked for GI consult and will follow recommendations.  Nausea medications ordered.  Continue Protonix. 2. Prolonged QT-avoid Zofran and other QT prolonging agents, magnesium IV has been given.  Follow mag levels. 3. Hypomagnesemia-IV replacement ordered and will follow up. 4. GERD-Protonix ordered for GI protection. 5. Stage IIIb CKD-stable. 6. Hypothyroidism-resume home levothyroxine.  DVT prophylaxis: enoxaparin Code Status: full  Family Communication: patient  Disposition Plan: remain inpatient, GI consult  Consultants:  Cardiology  GI   Procedures:    Antimicrobials:     Subjective: Patient reports that he still having epigastric abdominal pain and nausea and he did vomit this morning.  Objective: Vitals:   11/16/19 0451 11/16/19 0819 11/16/19 1342 11/16/19 1354  BP: 101/76 126/86 97/77 102/75  Pulse: 82 86 86 86  Resp: 18  17 17   Temp: 98.1 F (36.7 C)  97.8 F (36.6 C)   TempSrc:   Oral    SpO2: 97%  98% 98%  Weight:      Height:        Intake/Output Summary (Last 24 hours) at 11/16/2019 1624 Last data filed at 11/16/2019 1300 Gross per 24 hour  Intake 720 ml  Output 150 ml  Net 570 ml   Filed Weights   11/14/19 1433 11/15/19 1947  Weight: 113.4 kg 88.4 kg   REVIEW OF SYSTEMS  As per history otherwise all reviewed and reported negative  Exam:  General exam: Chronically ill-appearing male, awake and alert. Respiratory system: Clear. No increased work of breathing. Cardiovascular system: S1 & S2 heard. No JVD, murmurs, gallops, clicks or pedal edema. Gastrointestinal system: Abdomen is nondistended, soft and nontender. Normal bowel sounds heard. Central nervous system: Alert and oriented. No focal neurological deficits. Extremities: no CCE.  Data Reviewed: Basic Metabolic Panel: Recent Labs  Lab 11/14/19 1458 11/14/19 1730 11/16/19 0005 11/16/19 0611  NA 139  --  138  --   K 3.0*  --  3.2*  --   CL 100  --  99  --   CO2 27  --  29  --   GLUCOSE 105*  --  112*  --   BUN 20  --  21  --   CREATININE 1.76*  --  1.55*  --   CALCIUM 8.9  --  8.8*  --   MG  --  1.3*  --  1.8  PHOS  --  2.8  --   --    Liver Function Tests: Recent Labs  Lab 11/14/19 1458 11/16/19 0005  AST  16 16  ALT 10 10  ALKPHOS 78 72  BILITOT 1.0 0.0*  PROT 6.4* 6.2*  ALBUMIN 3.0* 2.9*   Recent Labs  Lab 11/14/19 1458  LIPASE 21   No results for input(s): AMMONIA in the last 168 hours. CBC: Recent Labs  Lab 11/14/19 1458  WBC 11.2*  HGB 13.7  HCT 42.6  MCV 96.4  PLT 175   Cardiac Enzymes: No results for input(s): CKTOTAL, CKMB, CKMBINDEX, TROPONINI in the last 168 hours. CBG (last 3)  No results for input(s): GLUCAP in the last 72 hours. Recent Results (from the past 240 hour(s))  SARS CORONAVIRUS 2 (TAT 6-24 HRS) Nasopharyngeal Nasopharyngeal Swab     Status: None   Collection Time: 11/14/19  9:00 PM   Specimen: Nasopharyngeal Swab  Result Value Ref Range  Status   SARS Coronavirus 2 NEGATIVE NEGATIVE Final    Comment: (NOTE) SARS-CoV-2 target nucleic acids are NOT DETECTED. The SARS-CoV-2 RNA is generally detectable in upper and lower respiratory specimens during the acute phase of infection. Negative results do not preclude SARS-CoV-2 infection, do not rule out co-infections with other pathogens, and should not be used as the sole basis for treatment or other patient management decisions. Negative results must be combined with clinical observations, patient history, and epidemiological information. The expected result is Negative. Fact Sheet for Patients: SugarRoll.be Fact Sheet for Healthcare Providers: https://www.woods-mathews.com/ This test is not yet approved or cleared by the [place] FDA and  has been authorized for detection and/or diagnosis of SARS-CoV-2 by FDA under an Emergency Use Authorization (EUA). This EUA will remain  in effect (meaning this test can be used) for the duration of the COVID-19 declaration under Section 56 4(b)(1) of the Act, 21 U.S.C. section 360bbb-3(b)(1), unless the authorization is terminated or revoked sooner. Performed at [facility], [place] [address], [place], [place] [address]      Studies: ECHOCARDIOGRAM COMPLETE  Result Date: 11/15/2019   ECHOCARDIOGRAM REPORT   Patient Name:   Justin Parsons Date of Exam: 11/15/2019 Medical Rec #:  [id number]            Height:       74.0 in Accession #:    [id number]           Weight:       250.0 lb Date of Birth:  July 16, 1941            BSA:          2.39 m Patient Age:    23 years             BP:           105/68 mmHg Patient Gender: M                    HR:           93 bpm. Exam Location:  [facility] Procedure: 2D Echo, Cardiac Doppler and Color Doppler Indications:    Chest Pain 786.50 / R07.9  History:        Patient has prior history of Echocardiogram examinations, most                 recent  02/16/2018. CHF, CAD and Previous Myocardial Infarction;                 Arrythmias:Atrial Fibrillation. Alcoholic cirrhosis of liver                 without  ascites.  Sonographer:    Alvino Chapel RCS Referring Phys: [id number] [place]  1. Left ventricular ejection fraction, by visual estimation, is 45 to 50%. The left ventricle has mildly decreased function. There is severely increased left ventricular hypertrophy.  2. Mild hypokinesis of the left ventricular, basal-mid anterolateral wall and inferolateral wall.  3. Left ventricular diastolic function could not be evaluated.  4. The left ventricle demonstrates regional wall motion abnormalities.  5. Global right ventricle has normal systolic function.The right ventricular size is normal. No increase in right ventricular wall thickness.  6. Left atrial size was moderately dilated.  7. Right atrial size was normal.  8. Mild calcification of the anterior mitral valve leaflet(s).  9. Mild thickening of the anterior mitral valve leaflet(s). 10. The mitral valve is degenerative. Mild to moderate mitral valve regurgitation. 11. The tricuspid valve is normal in structure. 12. Aortic valve regurgitation is mild to moderate. 13. The aortic valve is tricuspid. Aortic valve regurgitation is mild to moderate. Mild to moderate aortic valve sclerosis/calcification without any evidence of aortic stenosis. 14. The pulmonic valve was not well visualized. Pulmonic valve regurgitation is not visualized. 15. Mildly elevated pulmonary artery systolic pressure. 16. The tricuspid regurgitant velocity is 2.89 m/s, and with an assumed right atrial pressure of 3 mmHg, the estimated right ventricular systolic pressure is mildly elevated at 36.4 mmHg. 17. There is a new lateral wall motion abnormality compared to 04/30/2017. 18. Prior images reviewed side by side. FINDINGS  Left Ventricle: Left ventricular ejection fraction, by visual estimation, is 45 to 50%. The left  ventricle has mildly decreased function. Mild hypokinesis of the left ventricular, basal-mid anterolateral wall and inferolateral wall. The left ventricle demonstrates regional wall motion abnormalities. There is severely increased left ventricular hypertrophy. Concentric left ventricular hypertrophy. The left ventricular diastology could not be evaluated due to atrial fibrillation. Left ventricular diastolic function could not be evaluated. Right Ventricle: The right ventricular size is normal. No increase in right ventricular wall thickness. Global RV systolic function is has normal systolic function. The tricuspid regurgitant velocity is 2.89 m/s, and with an assumed right atrial pressure  of 3 mmHg, the estimated right ventricular systolic pressure is mildly elevated at 36.4 mmHg. Left Atrium: Left atrial size was moderately dilated. Right Atrium: Right atrial size was normal in size Pericardium: There is no evidence of pericardial effusion. Mitral Valve: The mitral valve is degenerative in appearance. There is mild thickening of the anterior mitral valve leaflet(s). There is mild calcification of the anterior mitral valve leaflet(s). Mild to moderate mitral valve regurgitation, with centrally-directed jet. Tricuspid Valve: The tricuspid valve is normal in structure. Tricuspid valve regurgitation is mild. Aortic Valve: The aortic valve is tricuspid. . There is moderate thickening and moderate calcification of the aortic valve. Aortic valve regurgitation is mild to moderate. Mild to moderate aortic valve sclerosis/calcification is present, without any evidence of aortic stenosis. There is moderate thickening of the aortic valve. There is moderate calcification of the aortic valve. Pulmonic Valve: The pulmonic valve was not well visualized. Pulmonic valve regurgitation is not visualized. Pulmonic regurgitation is not visualized. Aorta: The aortic root and ascending aorta are structurally normal, with no evidence  of dilitation. IAS/Shunts: No atrial level shunt detected by color flow Doppler.  LEFT VENTRICLE PLAX 2D LVIDd:         4.39 cm LVIDs:         2.94 cm LV PW:  1.45 cm LV IVS:        1.80 cm LVOT diam:     1.90 cm LV SV:         54 ml LV SV Index:   21.89 LVOT Area:     2.84 cm  LV Volumes (MOD) LV area d, A2C:    25.95 cm LV area d, A4C:    28.00 cm LV area s, A2C:    17.70 cm LV area s, A4C:    18.30 cm LV major d, A2C:   6.84 cm LV major d, A4C:   6.63 cm LV major s, A2C:   5.90 cm LV major s, A4C:   6.26 cm LV vol d, MOD A2C: 83.2 ml LV vol d, MOD A4C: 97.1 ml LV vol s, MOD A2C: 48.2 ml LV vol s, MOD A4C: 47.3 ml LV SV MOD A2C:     35.0 ml LV SV MOD A4C:     97.1 ml LV SV MOD BP:      42.2 ml RIGHT VENTRICLE TAPSE (M-mode): 1.5 cm LEFT ATRIUM              Index       RIGHT ATRIUM           Index LA diam:        4.00 cm  1.67 cm/m  RA Area:     20.10 cm LA Vol (A2C):   104.0 ml 43.52 ml/m RA Volume:   52.60 ml  22.01 ml/m LA Vol (A4C):   87.3 ml  36.53 ml/m LA Biplane Vol: 102.0 ml 42.68 ml/m  AORTIC VALVE LVOT Vmax:   93.25 cm/s LVOT Vmean:  57.700 cm/s LVOT VTI:    0.157 m  AORTA Ao Root diam: 3.50 cm MITRAL VALVE                        TRICUSPID VALVE MV Area (PHT): 4.49 cm             TR Peak grad:   33.4 mmHg MV PHT:        49.01 msec           TR Vmax:        289.00 cm/s MV Decel Time: 169 msec MV E velocity: 133.00 cm/s 103 cm/s SHUNTS                                     Systemic VTI:  0.16 m                                     Systemic Diam: 1.90 cm  Dani Gobble Croitoru MD Electronically signed by Sanda Klein MD Signature Date/Time: 11/15/2019/4:36:58 PM    Final    Scheduled Meds: . allopurinol  100 mg Oral Daily  . aspirin  81 mg Oral Daily  . atorvastatin  80 mg Oral q1800  . carvedilol  6.25 mg Oral BID WC  . cholecalciferol  1,000 Units Oral Daily  . doxycycline  100 mg Oral BID  . folic acid  1 mg Oral Daily  . levothyroxine  75 mcg Oral QAC breakfast  . pantoprazole  40  mg Oral Daily  . potassium chloride  20 mEq Oral Daily  . torsemide  20 mg Oral Daily   Continuous  Infusions:  Principal Problem:   Chest pain Active Problems:   GERD (gastroesophageal reflux disease)   CKD (chronic kidney disease), stage III   Hypothyroidism   Alcoholic cirrhosis of liver without ascites (HCC)   Atrial fibrillation, chronic (HCC)   Hypomagnesemia   CAD (coronary artery disease)   Prolonged QT interval   Epigastric pain  Time spent:   Irwin Brakeman, MD [facility] 11/16/2019, 4:24 PM    LOS: 1 day  How to contact the [facility] Attending or Consulting provider [place] or covering provider during after hours [place], for this patient?  1. Check the care team in [facility] and look for a) attending/consulting [facility] provider listed and b) the [facility] team listed 2. Log into [email or web address] and use [place]'s universal password to access. If you do not have the password, please contact the hospital operator. 3. Locate the [facility] provider you are looking for under [facility] and page to a number that you can be directly reached. 4. If you still have difficulty reaching the provider, please page the [facility] (Director on Call) for the Hospitalists listed on amion for assistance.

## 2019-11-16 NOTE — Progress Notes (Signed)
Patient complaining of burning pain to mid, lower chest. Midlevel notified. New order for Morphine 2 mg IV once given.

## 2019-11-17 ENCOUNTER — Encounter (HOSPITAL_COMMUNITY): Payer: Self-pay | Admitting: Internal Medicine

## 2019-11-17 DIAGNOSIS — R1013 Epigastric pain: Secondary | ICD-10-CM

## 2019-11-17 DIAGNOSIS — K219 Gastro-esophageal reflux disease without esophagitis: Principal | ICD-10-CM

## 2019-11-17 DIAGNOSIS — K7460 Unspecified cirrhosis of liver: Secondary | ICD-10-CM

## 2019-11-17 DIAGNOSIS — R112 Nausea with vomiting, unspecified: Secondary | ICD-10-CM

## 2019-11-17 LAB — AMMONIA: Ammonia: 15 umol/L (ref 9–35)

## 2019-11-17 LAB — CBC
HCT: 43.1 % (ref 39.0–52.0)
Hemoglobin: 14.3 g/dL (ref 13.0–17.0)
MCH: 31.5 pg (ref 26.0–34.0)
MCHC: 33.2 g/dL (ref 30.0–36.0)
MCV: 94.9 fL (ref 80.0–100.0)
Platelets: 160 10*3/uL (ref 150–400)
RBC: 4.54 MIL/uL (ref 4.22–5.81)
RDW: 15.2 % (ref 11.5–15.5)
WBC: 11.2 10*3/uL — ABNORMAL HIGH (ref 4.0–10.5)
nRBC: 0 % (ref 0.0–0.2)

## 2019-11-17 LAB — BASIC METABOLIC PANEL
Anion gap: 11 (ref 5–15)
BUN: 18 mg/dL (ref 8–23)
CO2: 26 mmol/L (ref 22–32)
Calcium: 8.9 mg/dL (ref 8.9–10.3)
Chloride: 100 mmol/L (ref 98–111)
Creatinine, Ser: 1.49 mg/dL — ABNORMAL HIGH (ref 0.61–1.24)
GFR calc Af Amer: 51 mL/min — ABNORMAL LOW (ref >60)
GFR calc non Af Amer: 44 mL/min — ABNORMAL LOW (ref >60)
Glucose, Bld: 94 mg/dL (ref 70–99)
Potassium: 4 mmol/L (ref 3.5–5.1)
Sodium: 137 mmol/L (ref 135–145)

## 2019-11-17 LAB — MAGNESIUM: Magnesium: 1.7 mg/dL (ref 1.7–2.4)

## 2019-11-17 MED ORDER — POTASSIUM CHLORIDE CRYS ER 10 MEQ PO TBCR: 10.0000 meq | EXTENDED_RELEASE_TABLET | Freq: Every day | ORAL | 0 refills | Status: AC

## 2019-11-17 MED ORDER — POLYETHYLENE GLYCOL 3350 17 G PO PACK
17.0000 g | PACK | Freq: Once | ORAL | Status: AC
  Administered 2019-11-17: 17 g via ORAL
  Filled 2019-11-17: qty 1

## 2019-11-17 MED ORDER — CARVEDILOL 6.25 MG PO TABS: 6.2500 mg | ORAL_TABLET | Freq: Two times a day (BID) | ORAL | 0 refills | Status: AC

## 2019-11-17 NOTE — Progress Notes (Signed)
Patient could not find a ride home tonight for discharge. I notified DR. Johnson and he told me to let the [facility] know and he will be d/cd in the morning.

## 2019-11-17 NOTE — Consult Note (Addendum)
Referring Provider: [facility] Primary Care Physician:  Monico Blitz, MD Primary Gastroenterologist:  Dr. Gala Romney  Date of Admission: 11/14/2019 Date of Consultation: 11/17/2019  Reason for Consultation: Epigastric abdominal pain, atypical chest pains, nausea and emesis  HPI:  Justin Parsons is a 78 y.o. year old male admitted on 11/14/19 for further evaluation of chest pain. Associated dyspnea, dizziness, and diaphoresis. Also reported epigastric pain and 2 episodes of emesis in the last few days. Troponin's flat. EKG not specific for ischemic changes. ECHO with LVEF 45-50%, new lateral wall motion abnormality compared to 04/30/2017. Cardiology has seen patient and feels symptoms are more consistent with GI etiology as patient reported epigastric/mid chest burning lasting hours at a time worse with eating.  No current plans for ischemic testing. States ould be subacute from his known prior residual coronary disease. Plans to continue medical therapy. GI was subsequently consulted for epigastric pain, atypical chest pain, nausea, and vomiting. Hospitalists has started patient on PPI BID and Reglan BID. Zofran as needed.   Patient has history of multiple comorbidities as per below.  GI history significant for adenomatous colon polyp, likely NASH cirrhosis, +/- ETOH abuse, historically well compensated, GERD, chronic nausea/vomiting, and dysphagia. Multiple prior EGDs due to dysphagia with findings of esophageal stricture, pyloric stenosis. History of delayed gastric emptying, documented in Sept 2018, although prior to EGD revealing pyloric stenosis.  Last EGD on 12/23/17 with single short Grade 2 varix mid esophagus, just above GE junction was nearly circumferential denuding ulcerations/loss of normal mucosal appearance (somewhat punched out) mucosa. Query pill induced injury versus other process. Portal hypertensive gastropathy, patent pylorus. Normal duodenal bulb and second portion of  duodenum. No dilation. Path negative for malignancy. Recommended repeat EGD in 3 months.  Later admitted in February 2019 for nausea/vomiting and dysphagia. BPE on 01/09/2018 revealing moderate narrowing within the distal esophagus, smooth and long segment. This could be related to reflux stricture. The 13 mm barium tablet sticks in this area. Esophageal dysmotility. Manometry on 01/10/18. Findings with EG junction outflow obstruction. Not consistent with achalasia. Will need to exclude mechanical peptic stricture. Plans to hold off on EGD as he was tolerating soft diet and refer [facility] for further evaluation.   Saw [facility] in March 2019 and it was noted he was having some modest improvement over the previous 2 weeks able to tolerate solid foods without vomiting.  Possible that acid suppression therapy is improving his symptoms and recommended timed barium esophagram to assess for esophageal retention and evidence of achalasia.  Query candidacy for pneumatic esophageal dilation or standard dilation, defer Botox injections. Doesn't appear imaging was completed and patient hasn't returned to [facility] seen in our office on 12/17/18 for follow-up of GERD and dysphagia. Dexilant was too expensive. Protonix didn't work well. He had resumed omeprazole for GERD. Also requiring tums daily. No persistent N/V or abdominal pain at that time. Minimal appetite. Advised to continue Prilosec 20 mg BID and tums as needed.  Today:    Difficult historian. Has been having intermittent pain in his upper abdomen/just below his sternum and burning in his throat all the way down to his stomach for the last month. Worse after eating. Pain stays right in the middle. No radiation.  Last an hour or so. Prior to 1 month ago, states he was doing pretty well. States he came to the hospital to make sure it wasn't his heart.  Takes omeprazole twice a day. Taking tums occasionally. Also taking hemp  oil which helps relieve the  burning. Feels like he has trouble breathing when he has burning. Otherwise no shortness of breath.   Admits to trouble swallowing pills. Cuts large pills into 4 pieces at home. This is chronic. Not worsening. Denies dysphagia to foods or liquids. Also with intermittent nausea and vomiting at home. Vomiting doesn't occur unless he takes large pills. Present for a year or so.    Denies hematemesis. Denies coffee ground emesis. Denies melena or hematochezia. Doesn't eat much at home. Doesn't have a good appetite. Unable to tell me if he has early satiety. Lack of appetite is chronic. Reports 100 lb weight loss in last 3 years.   Feeling mildly nauseous now. No vomiting today. States he did vomit yesterday. Admits to epigastric pain/pain just below his sternum currently. No esophageal burning at this time. Feels symptoms are improving over the last day.   Unable to confirm outpatient medications. Thinks he takes diclofenac twice a day. Denies ibuprofen, advil, aleve, or goody powders. Gets all his medications from [facility] in [place].   Denies alcohol use. Last alcohol use about 4-5 months.  Denies swelling in legs or abdomen. Taking a fluid pill. No confusion. No yellowing of eyes or skin.   BMs about twice a week. Soft/runny at times. Feels his bowel started moving better after disimpaction last week. No BM during this admission. Thinks this is because he doesn't eat much. He is passing gas.   Lives at home and has a friend that comes by every day to help him with his medications and daily activities. He uses a wheel chair most of the time. Uses a walter at times.   Called [facility] in [place]: Filled on 12/23: Doxycycline 100 mg cap x 10 days.  Other current medications include:  Tramadol 50 mg TID, levothyroxine 75 mcg daily, diclofenac 75 mg BID, folic acid 1 mg daily, torsemide 20 daily, lactulose 10 mg/35m taking 15 mL daily, Protonix 40 mg BID, gabapentin 300 TID, allopurinol 300 mg, Plavix 75 mg  daily, Lipitor 80 mg daily.     Past Medical History:  Diagnosis Date  . Adenomatous polyp of colon   . [place]  . Arthritis   . Atrial fibrillation [place]   . Blindness of right eye 1997  . CAD (coronary artery disease)    Multivessel disease at cardiac catheterization June 2018, DES to LAD June 2018 with significant residual disease including diagonal and obtuse marginal vessels as well as RCA  . Cirrhosis of liver (HCC)    Associated portal gastropathy  . CKD (chronic kidney disease) stage 3, GFR 30-59 ml/min   . Essential hypertension   . Hiatal hernia   . Hypothyroidism   . 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 TIA (transient ischemic attack)     Past Surgical History:  Procedure Laterality Date  . BIOPSY  12/23/2017   Procedure: BIOPSY;  Surgeon: RDaneil Dolin MD;  [facility];  Service: Endoscopy;;  esophagus  . CARDIAC CATHETERIZATION  2003  . COLONOSCOPY  08/2008   normal, repeat exam in 5-7 years  . COLONOSCOPY  2004   rectal adenomatous polyp  . COLONOSCOPY N/A 12/01/2014   RYFV:CBSWHQrectum/elongated colon  . CORONARY ATHERECTOMY N/A 05/03/2017   Procedure: Coronary Atherectomy;  Surgeon: HLeonie Man MD;  [facility];  Service: Cardiovascular;  Laterality: N/A;  . CORONARY STENT INTERVENTION N/A 05/03/2017   Procedure: Coronary Stent Intervention;  Surgeon:  Leonie Man, MD;  Location: [facility];  Service: Cardiovascular;  Laterality: N/A;  . ESOPHAGEAL DILATION N/A 02/07/2015   Procedure: ESOPHAGEAL DILATION;  Surgeon: Daneil Dolin, MD;  [facility];  Service: Endoscopy;  Laterality: N/A;  . ESOPHAGEAL DILATION N/A 08/17/2017   Procedure: ESOPHAGEAL DILATION;  Surgeon: Danie Binder, MD;  [facility];  Service: Endoscopy;  Laterality: N/A;  . ESOPHAGEAL DILATION N/A 09/23/2017   Procedure: ESOPHAGEAL DILATION;  Surgeon: Danie Binder, MD;  [facility];   Service: Endoscopy;  Laterality: N/A;  . ESOPHAGEAL MANOMETRY N/A 01/10/2018   Procedure: ESOPHAGEAL MANOMETRY (EM);  Surgeon: Danie Binder, MD;  [facility]; EG junction outflow obstruction. Not consistent with achalasia. Will need to exclude mechanical peptic stricture. Plans to hold off on EGD as he was tolerating soft diet and refer to [facility] for further evaluation.   . ESOPHAGOGASTRODUODENOSCOPY  02/11/2012   Dr. Gala Romney: portal gastropathy, gastric erosions, esophageal ulcerations likely pill-induced, surveillance in 2 years  . ESOPHAGOGASTRODUODENOSCOPY N/A 12/01/2014   Dr. Volney American esophageal stricture dilated with the scope passage, portal gastropathy, negative H.pylori on gastric biopsies, esophageal biopsies benign  . ESOPHAGOGASTRODUODENOSCOPY N/A 02/07/2015   Procedure: ESOPHAGOGASTRODUODENOSCOPY (EGD);  Surgeon: Daneil Dolin, MD;  [facility];  Service: Endoscopy;  Laterality: N/A;  1115  . ESOPHAGOGASTRODUODENOSCOPY N/A 08/17/2017   benign-appearing esophageal stenosis s/p dilation, mild gastritis, pylorus stenosis s/p dilation  . ESOPHAGOGASTRODUODENOSCOPY N/A 09/23/2017   moderate benign-appearing instrinsic stenosis s/p dilation, mild gastritis  . ESOPHAGOGASTRODUODENOSCOPY N/A 10/18/2017   Benign-appearing esophageal stricture s/p dilation, gastritis, benign-appearing intrinsice moderate pylorus stenosis s/p dilation  . ESOPHAGOGASTRODUODENOSCOPY (EGD) WITH PROPOFOL N/A 12/23/2017   Dr. Gala Romney: single short Grade 2 varix mid esophagus, just above GE junction was nearly circumferential denuding ulcerations/loss of normal mucosal appearance (somewhat punched out) mucosa. Query pill induced injury with path negative for malignancy. Portal hypertensive gastropathy, patent pylorus, normal duodenal bulb and second portion of duodenum, no dilation performed.   Marland Kitchen EYE SURGERY     RIGHT EYE REMOVED  . JOINT REPLACEMENT    . LEFT HEART CATH AND CORONARY ANGIOGRAPHY  N/A 05/01/2017   Procedure: Left Heart Cath and Coronary Angiography;  Surgeon: Leonie Man, MD;  Location: [place];  Service: Cardiovascular;  Laterality: N/A;  . Venia Minks DILATION N/A 10/18/2017   Procedure: Venia Minks DILATION;  Surgeon: Danie Binder, MD;  [facility];  Service: Endoscopy;  Laterality: N/A;  . s/p eye implant  1997   artificial eye, right  . SAVORY DILATION N/A 10/18/2017   Procedure: SAVORY DILATION;  Surgeon: Danie Binder, MD;  [facility];  Service: Endoscopy;  Laterality: N/A;  . TOTAL HIP ARTHROPLASTY  2002  . TOTAL HIP ARTHROPLASTY  2006/2012   revision in 2012  . TOTAL KNEE ARTHROPLASTY  1999/2003   left/right    Prior to Admission medications   Medication Sig Start Date End Date Taking? Authorizing Provider  allopurinol (ZYLOPRIM) 300 MG tablet Take 300 mg by mouth daily. 09/15/19  Yes [provider]  atorvastatin (LIPITOR) 80 MG tablet Take 80 mg by mouth daily at 6 PM.  09/12/17  Yes [provider]  clopidogrel (PLAVIX) 75 MG tablet Take 1 tablet (75 mg total) by mouth daily. 05/07/17  Yes Barton Dubois, MD  diclofenac (VOLTAREN) 75 MG EC tablet Take 75 mg by mouth 2 (two) times daily. 11/06/19  Yes [provider]  doxycycline (VIBRAMYCIN) 100 MG capsule Take 1 capsule (100 mg total) by mouth 2 (two) times daily. 11/11/19  Yes Noemi Chapel, MD  gabapentin (NEURONTIN) 300 MG capsule Take 300 mg by mouth 3 (three) times daily as needed. 10/10/19  Yes [provider]  levothyroxine (SYNTHROID) 75 MCG tablet Take 75 mcg by mouth daily before breakfast.  09/12/17  Yes [provider]  metoCLOPramide (REGLAN) 5 MG tablet 1 po 30 minutes prior to meals bid Patient taking differently: Take 5 mg by mouth 2 (two) times daily before a meal. 1 po 30 minutes prior to meals bid 10/18/17  Yes Fields, Sandi L, MD  pantoprazole (PROTONIX) 40 MG tablet Take 40 mg by mouth 2 (two) times  daily. 10/22/19  Yes [provider]  torsemide (DEMADEX) 20 MG tablet Take 20 mg by mouth daily.    Yes [provider]  traMADol (ULTRAM) 50 MG tablet Take 1 tablet (50 mg total) by mouth every 6 (six) hours as needed. Patient taking differently: Take 50 mg by mouth 3 (three) times daily.  08/18/17  Yes Tat, Shanon Brow, MD  allopurinol (ZYLOPRIM) 100 MG tablet Take 1 tablet (100 mg total) by mouth daily. Patient not taking: Reported on 11/15/2019 08/19/17   Orson Eva, MD  carvedilol (COREG) 3.125 MG tablet Take 1 tablet by mouth 2 (two) times daily. 12/20/17   [provider]  Cholecalciferol (VITAMIN D3) 2000 units CHEW Chew by mouth daily.    [provider]  diltiazem (CARDIZEM CD) 120 MG 24 hr capsule Take 1 capsule (120 mg total) by mouth daily. 02/20/18 02/20/19  Isaac Bliss, Rayford Halsted, MD  folic acid (FOLVITE) 1 MG tablet Take 1 tablet (1 mg total) by mouth daily. Patient not taking: Reported on 11/15/2019 05/07/17   Barton Dubois, MD  polyethylene glycol [facility] / Floria Raveling) packet Take 17 g daily by mouth. Patient not taking: Reported on 11/15/2019 09/25/17   Kathie Dike, MD    Current Facility-Administered Medications  Medication Dose Route Frequency Provider Last Rate Last Admin  . acetaminophen (TYLENOL) tablet 650 mg  650 mg Oral Q6H PRN Reubin Milan, MD       Or  . acetaminophen (TYLENOL) suppository 650 mg  650 mg Rectal Q6H PRN Reubin Milan, MD      . allopurinol (ZYLOPRIM) tablet 100 mg  100 mg Oral Daily Gosrani, Nimish C, MD   100 mg at 11/17/19 0940  . aspirin chewable tablet 81 mg  81 mg Oral Daily Gosrani, Nimish C, MD   81 mg at 11/17/19 0940  . atorvastatin (LIPITOR) tablet 80 mg  80 mg Oral q1800 Gosrani, Nimish C, MD   80 mg at 11/16/19 1743  . carvedilol (COREG) tablet 6.25 mg  6.25 mg Oral BID WC Arnoldo Lenis, MD   6.25 mg at 11/17/19 7412  . cholecalciferol (VITAMIN D) tablet 1,000 Units  1,000 Units Oral Daily  Doree Albee, MD   1,000 Units at 11/17/19 0939  . folic acid (FOLVITE) tablet 1 mg  1 mg Oral Daily Gosrani, Nimish C, MD   1 mg at 11/17/19 0941  . gabapentin (NEURONTIN) capsule 300 mg  300 mg Oral TID PRN Johnson, Clanford L, MD      . levothyroxine (SYNTHROID) tablet 75 mcg  75 mcg Oral QAC breakfast Anastasio Champion, Nimish C, MD   75 mcg at 11/17/19 0539  . metoCLOPramide (REGLAN) tablet 5 mg  5 mg Oral BID AC Johnson,  Clanford L, MD   5 mg at 11/17/19 0940  . pantoprazole (PROTONIX) EC tablet 40 mg  40 mg Oral BID Wynetta Emery, Clanford L, MD   40 mg at 11/17/19 0939  . polyethylene glycol (MIRALAX / GLYCOLAX) packet 17 g  17 g Oral Once Jodi Mourning, Elfida Shimada S, PA-C      . potassium chloride SA (KLOR-CON) CR tablet 20 mEq  20 mEq Oral Daily Anastasio Champion, Nimish C, MD   20 mEq at 11/17/19 0938  . prochlorperazine (COMPAZINE) injection 10 mg  10 mg Intravenous Q6H PRN Johnson, Clanford L, MD   10 mg at 11/16/19 1000  . torsemide (DEMADEX) tablet 20 mg  20 mg Oral Daily Gosrani, Nimish C, MD   20 mg at 11/17/19 0941    Allergies as of 11/14/2019  . (No Known Allergies)    Family History  Problem Relation Age of Onset  . Ovarian cancer Sister   . Colon cancer Neg Hx     Social History   Socioeconomic History  . Marital status: Divorced    Spouse name: Not on file  . Number of children: Not on file  . Years of education: Not on file  . Highest education level: Not on file  Occupational History  . Not on file  Tobacco Use  . Smoking status: Never Smoker  . Smokeless tobacco: Never Used  . Tobacco comment: used to chew tobacco, none in 15 years  Substance and Sexual Activity  . Alcohol use: Yes    Alcohol/week: 0.0 standard drinks    Comment: occ beer  . Drug use: No  . Sexual activity: Not Currently  Other Topics Concern  . Not on file  Social History Narrative   One son deceased while in prison, drug addiction.   Social Determinants of Health   Financial Resource Strain:   .  Difficulty of Paying Living Expenses: Not on file  Food Insecurity:   . Worried About Charity fundraiser in the Last Year: Not on file  . Ran Out of Food in the Last Year: Not on file  Transportation Needs:   . Lack of Transportation (Medical): Not on file  . Lack of Transportation (Non-Medical): Not on file  Physical Activity:   . Days of Exercise per Week: Not on file  . Minutes of Exercise per Session: Not on file  Stress:   . Feeling of Stress : Not on file  Social Connections:   . Frequency of Communication with Friends and Family: Not on file  . Frequency of Social Gatherings with Friends and Family: Not on file  . Attends Religious Services: Not on file  . Active Member of Clubs or Organizations: Not on file  . Attends Archivist Meetings: Not on file  . Marital Status: Not on file  Intimate Partner Violence:   . Fear of Current or Ex-Partner: Not on file  . Emotionally Abused: Not on file  . Physically Abused: Not on file  . Sexually Abused: Not on file    Review of Systems: Gen: Denies fever, chills.   CV: Denies current chest pain or heart palpitations Resp: Occasional cough. No shortness of breath.  GI: See HPI GU : Denies urinary burning, urinary frequency, urinary incontinence.  MS: Admits to pain in his legs and feet.  Derm: Denies rash Psych: Denies depression, anxiety Heme: Admits to easy bruising.   Physical Exam: Vital signs in last 24 hours: Temp:  [97.8 F (36.6 C)-98.9 F (37.2 C)]  98 F (36.7 C) (12/29 0359) Pulse Rate:  [81-100] 100 (12/29 0359) Resp:  [16-18] 18 (12/29 0359) BP: (97-111)/(75-82) 111/82 (12/29 0359) SpO2:  [95 %-98 %] 95 % (12/29 0359) Last BM Date: 11/15/19 General:   Alert, no acute distress, chronically ill-appearing.  Somewhat slow in responding.  Unclear of patient's baseline mental status. Head:  Normocephalic and atraumatic. Eyes:  Sclera clear, no icterus.   Conjunctiva pink. Ears:  Normal auditory  acuity. Nose:  No deformity, discharge,  or lesions. Lungs:  Clear throughout to auscultation.   No wheezes, crackles, or rhonchi. No acute distress. Heart:  Regular rate and rhythm, no murmurs appreciated. Abdomen:  Soft, nontender and nondistended. No masses, hepatosplenomegaly or hernias noted. Normal bowel sounds, without guarding, and without rebound.   Rectal:  Deferred. Msk:  Symmetrical without gross deformities. . Extremities:  Without edema. Neurologic: Alert and oriented x3.  He is a poor historian and appears somewhat slow to respond.  Unclear patient's baseline mental status. Skin: Bruising noted on arms. Psych: Normal mood and affect.  Intake/Output from previous day: 12/28 0701 - 12/29 0700 In: 960 [P.O.:960] Out: 500 [Urine:500] Intake/Output this shift: No intake/output data recorded.  Lab Results: Recent Labs    11/14/19 1458 11/17/19 0939  WBC 11.2* 11.2*  HGB 13.7 14.3  HCT 42.6 43.1  PLT 175 160   BMET Recent Labs    11/14/19 1458 11/16/19 0005 11/17/19 0939  NA 139 138 137  K 3.0* 3.2* 4.0  CL 100 99 100  CO2 27 29 26   GLUCOSE 105* 112* 94  BUN 20 21 18   CREATININE 1.76* 1.55* 1.49*  CALCIUM 8.9 8.8* 8.9   LFT Recent Labs    11/14/19 1458 11/16/19 0005  PROT 6.4* 6.2*  ALBUMIN 3.0* 2.9*  AST 16 16  ALT 10 10  ALKPHOS 78 72  BILITOT 1.0 0.0*  BILIDIR 0.3*  --   IBILI 0.7  --    PT/INR Recent Labs    11/16/19 1303  LABPROT 14.8  INR 1.2   Hepatitis Panel No results for input(s): HEPBSAG, HCVAB, HEPAIGM, HEPBIGM in the last 72 hours. C-Diff No results for input(s): CDIFFTOX in the last 72 hours.  Studies/Results: ECHOCARDIOGRAM COMPLETE  Result Date: 11/15/2019   ECHOCARDIOGRAM REPORT   Patient Name:   Justin Parsons Date of Exam: 11/15/2019 Medical Rec #:  [id number]            Height:       74.0 in Accession #:    [id number]           Weight:       250.0 lb Date of Birth:  [birth year]            BSA:          2.39 m  Patient Age:    20 years             BP:           105/68 mmHg Patient Gender: M                    HR:           93 bpm. Exam Location:  [facility] Procedure: 2D Echo, Cardiac Doppler and Color Doppler Indications:    Chest Pain 786.50 / R07.9  History:        Patient has prior history of Echocardiogram examinations, most  recent 02/16/2018. CHF, CAD and Previous Myocardial Infarction;                 Arrythmias:Atrial Fibrillation. Alcoholic cirrhosis of liver                 without ascites.  Sonographer:    Alvino Chapel RCS Referring Phys: [id number] [place]  1. Left ventricular ejection fraction, by visual estimation, is 45 to 50%. The left ventricle has mildly decreased function. There is severely increased left ventricular hypertrophy.  2. Mild hypokinesis of the left ventricular, basal-mid anterolateral wall and inferolateral wall.  3. Left ventricular diastolic function could not be evaluated.  4. The left ventricle demonstrates regional wall motion abnormalities.  5. Global right ventricle has normal systolic function.The right ventricular size is normal. No increase in right ventricular wall thickness.  6. Left atrial size was moderately dilated.  7. Right atrial size was normal.  8. Mild calcification of the anterior mitral valve leaflet(s).  9. Mild thickening of the anterior mitral valve leaflet(s). 10. The mitral valve is degenerative. Mild to moderate mitral valve regurgitation. 11. The tricuspid valve is normal in structure. 12. Aortic valve regurgitation is mild to moderate. 13. The aortic valve is tricuspid. Aortic valve regurgitation is mild to moderate. Mild to moderate aortic valve sclerosis/calcification without any evidence of aortic stenosis. 14. The pulmonic valve was not well visualized. Pulmonic valve regurgitation is not visualized. 15. Mildly elevated pulmonary artery systolic pressure. 16. The tricuspid regurgitant velocity is 2.89 m/s, and with an  assumed right atrial pressure of 3 mmHg, the estimated right ventricular systolic pressure is mildly elevated at 36.4 mmHg. 17. There is a new lateral wall motion abnormality compared to 04/30/2017. 18. Prior images reviewed side by side. FINDINGS  Left Ventricle: Left ventricular ejection fraction, by visual estimation, is 45 to 50%. The left ventricle has mildly decreased function. Mild hypokinesis of the left ventricular, basal-mid anterolateral wall and inferolateral wall. The left ventricle demonstrates regional wall motion abnormalities. There is severely increased left ventricular hypertrophy. Concentric left ventricular hypertrophy. The left ventricular diastology could not be evaluated due to atrial fibrillation. Left ventricular diastolic function could not be evaluated. Right Ventricle: The right ventricular size is normal. No increase in right ventricular wall thickness. Global RV systolic function is has normal systolic function. The tricuspid regurgitant velocity is 2.89 m/s, and with an assumed right atrial pressure  of 3 mmHg, the estimated right ventricular systolic pressure is mildly elevated at 36.4 mmHg. Left Atrium: Left atrial size was moderately dilated. Right Atrium: Right atrial size was normal in size Pericardium: There is no evidence of pericardial effusion. Mitral Valve: The mitral valve is degenerative in appearance. There is mild thickening of the anterior mitral valve leaflet(s). There is mild calcification of the anterior mitral valve leaflet(s). Mild to moderate mitral valve regurgitation, with centrally-directed jet. Tricuspid Valve: The tricuspid valve is normal in structure. Tricuspid valve regurgitation is mild. Aortic Valve: The aortic valve is tricuspid. . There is moderate thickening and moderate calcification of the aortic valve. Aortic valve regurgitation is mild to moderate. Mild to moderate aortic valve sclerosis/calcification is present, without any evidence of aortic  stenosis. There is moderate thickening of the aortic valve. There is moderate calcification of the aortic valve. Pulmonic Valve: The pulmonic valve was not well visualized. Pulmonic valve regurgitation is not visualized. Pulmonic regurgitation is not visualized. Aorta: The aortic root and ascending aorta are structurally normal, with no evidence of dilitation.  IAS/Shunts: No atrial level shunt detected by color flow Doppler.  LEFT VENTRICLE PLAX 2D LVIDd:         4.39 cm LVIDs:         2.94 cm LV PW:         1.45 cm LV IVS:        1.80 cm LVOT diam:     1.90 cm LV SV:         54 ml LV SV Index:   21.89 LVOT Area:     2.84 cm  LV Volumes (MOD) LV area d, A2C:    25.95 cm LV area d, A4C:    28.00 cm LV area s, A2C:    17.70 cm LV area s, A4C:    18.30 cm LV major d, A2C:   6.84 cm LV major d, A4C:   6.63 cm LV major s, A2C:   5.90 cm LV major s, A4C:   6.26 cm LV vol d, MOD A2C: 83.2 ml LV vol d, MOD A4C: 97.1 ml LV vol s, MOD A2C: 48.2 ml LV vol s, MOD A4C: 47.3 ml LV SV MOD A2C:     35.0 ml LV SV MOD A4C:     97.1 ml LV SV MOD BP:      42.2 ml RIGHT VENTRICLE TAPSE (M-mode): 1.5 cm LEFT ATRIUM              Index       RIGHT ATRIUM           Index LA diam:        4.00 cm  1.67 cm/m  RA Area:     20.10 cm LA Vol (A2C):   104.0 ml 43.52 ml/m RA Volume:   52.60 ml  22.01 ml/m LA Vol (A4C):   87.3 ml  36.53 ml/m LA Biplane Vol: 102.0 ml 42.68 ml/m  AORTIC VALVE LVOT Vmax:   93.25 cm/s LVOT Vmean:  57.700 cm/s LVOT VTI:    0.157 m  AORTA Ao Root diam: 3.50 cm MITRAL VALVE                        TRICUSPID VALVE MV Area (PHT): 4.49 cm             TR Peak grad:   33.4 mmHg MV PHT:        49.01 msec           TR Vmax:        289.00 cm/s MV Decel Time: 169 msec MV E velocity: 133.00 cm/s 103 cm/s SHUNTS                                     Systemic VTI:  0.16 m                                     Systemic Diam: 1.90 cm  Dani Gobble Croitoru MD Electronically signed by Sanda Klein MD Signature Date/Time:  11/15/2019/4:36:58 PM    Final     Impression: 78 y.o. year old male with multiple comorbidities as per above.  GI history significant for adenomatous colon polyp, likely NASH cirrhosis, +/- ETOH abuse, historically well compensated, GERD, chronic nausea/vomiting, and dysphagia with multiple EGDs in the past revealing esophageal stricture and pyloric stenosis.  Admitted  on 09/14/2019 for further evaluation of chest pain.  Cardiology consulted.  Troponins not impressive, EKG not specific for ischemic changes.  Felt his current symptoms were more consistent with GI etiology rather than cardiac etiology as he reported epigastric/mid chest burning lasting hours at a time worse with eating.  No current plans for ischemic testing.  Continue medical therapy with ASA, atrova, coreg bid. Can stop plavix.   Per patient, for the last month he has had intermittent epigastric abdominal pain with associated burning up through his chest into his throat lasting about an hour and improved with use of hemp oil.  Denies worsening of intermittent chronic nausea/vomiting. Last episode of vomiting yesterday.  Vomiting closely associated with taking medications and feeling that he gets stuck in his esophagus.  Denies dysphagia to food but difficult to get clear history.  Unclear if he is taking his home medications properly.  Patient was started on Protonix on 12/27 and Reglan on 12/28.  Overall, patient feels his symptoms are starting to improve.  He denies hematemesis, coffee-ground emesis, melena, or hematochezia.  Admits to taking diclofenac twice daily.  Hemoglobin normal at 14.3.  Last EGD in Feb 2019 with single short Grade 2 varix mid esophagus, just above GE junction was nearly circumferential denuding ulcerations/loss of normal mucosal appearance (somewhat punched out) mucosa. Query pill induced injury versus other process. Portal hypertensive gastropathy, patent pylorus. No dilation. Recommended EGD in 3 months.    Differentials of epigastric pain and esophageal burning include gastritis, duodenitis, esophagitis likely secondary to chronic NSAID use and ?  adherence to PPI therapy outpatient.  Cannot rule out PUD.  With history of esophageal stricture requiring multiple dilations, I suspect this is likely contributing to his pill dysphagia and intermittent vomiting. Nausea likely multifactorial with history of pyloric stenosis, chronic GERD, and continued use of diclofenac.  Suspect patient would likely benefit from EGD with possible esophageal dilation for further evaluation of epigastric pain/esophageal burning as well as treatment of dysphagia.  We will need to discuss this further with Dr. Oneida Alar.  Would not be able to complete EGD today due to having a regular diet.  Constipation: Reports some baseline constipation with 1-2 BMs a week. No BM since admission. Consider adding lactulose vs Miralax due to history of cirrhosis. According to [facility], patient has been prescribed lactulose. Will check ammonia to determine if lactulose is needed.   Cirrhosis: Likely secondary to NASH +/- ETOH abuse.  Historically well compensated.  According to [facility] in [place], patient is supposed to take lactulose 15 mL daily.  Unclear if he has been taken this.  He seems somewhat slow during my visit with him today.  Not sure of his baseline mental status.  He denies confusion. Meld 14. Last EGD in 2019 with small grade 2 varix. No other signs or symptoms of decompensation.  Will check ammonia. Patient needs EGD for varices screening.  This may be completed inpatient due to upper GI issues as discussed above. Otherwise, continue cirrhosis care outpatient.   Plan: Continue Protonix 40 mg twice daily. Continue Reglan for now. Zofran as needed. Will discuss with Dr. Oneida Alar the role of inpatient EGD with possible dilation.  At the earliest, this would be performed tomorrow. Check ammonia level. Add MiraLAX 17g now.   May change to lactulose if ammonia is elevated.     LOS: 2 days    11/17/2019, 12:16 PM   Aliene Altes, [facility]

## 2019-11-17 NOTE — Progress Notes (Signed)
Progress Note  Patient Name: Justin Parsons Date of Encounter: 11/17/2019  Primary Cardiologist: No primary care provider on file. new, never followed up after stent 2018  Subjective   Some N/V yesterday, intermittent burning epigastric/chest pain  Inpatient Medications    Scheduled Meds: . allopurinol  100 mg Oral Daily  . aspirin  81 mg Oral Daily  . atorvastatin  80 mg Oral q1800  . carvedilol  6.25 mg Oral BID WC  . cholecalciferol  1,000 Units Oral Daily  . folic acid  1 mg Oral Daily  . levothyroxine  75 mcg Oral QAC breakfast  . metoCLOPramide  5 mg Oral BID AC  . pantoprazole  40 mg Oral BID  . potassium chloride  20 mEq Oral Daily  . torsemide  20 mg Oral Daily   Continuous Infusions:  PRN Meds: acetaminophen **OR** acetaminophen, gabapentin, prochlorperazine   Vital Signs    Vitals:   11/16/19 1949 11/16/19 2021 11/17/19 0002 11/17/19 0359  BP:  102/76 104/78 111/82  Pulse:  81 82 100  Resp:  16 18 18   Temp:  98 F (36.7 C) 98.9 F (37.2 C) 98 F (36.7 C)  TempSrc:  Oral Oral Oral  SpO2: 98% 97% 97% 95%  Weight:      Height:        Intake/Output Summary (Last 24 hours) at 11/17/2019 1103 Last data filed at 11/17/2019 0606 Gross per 24 hour  Intake 720 ml  Output 350 ml  Net 370 ml   Last 3 Weights 11/15/2019 11/14/2019 11/11/2019  Weight (lbs) 194 lb 14.2 oz 250 lb 250 lb  Weight (kg) 88.4 kg 113.4 kg 113.399 kg      Telemetry    n/a- Personally Reviewed  ECG    n/a - Personally Reviewed  Physical Exam   GEN: No acute distress.   Neck: No JVD Cardiac: irreg, 2/6 systolic murmur apex Respiratory: Clear to auscultation bilaterally. GI: Soft, nontender, non-distended  MS: No edema; No deformity. Neuro:  Nonfocal  Psych: Normal affect   Labs    High Sensitivity Troponin:   Recent Labs  Lab 11/14/19 1458 11/14/19 1730  TROPONINIHS 19* 20*      Chemistry Recent Labs  Lab 11/14/19 1458 11/16/19 0005  11/17/19 0939  NA 139 138 137  K 3.0* 3.2* 4.0  CL 100 99 100  CO2 27 29 26   GLUCOSE 105* 112* 94  BUN 20 21 18   CREATININE 1.76* 1.55* 1.49*  CALCIUM 8.9 8.8* 8.9  PROT 6.4* 6.2*  --   ALBUMIN 3.0* 2.9*  --   AST 16 16  --   ALT 10 10  --   ALKPHOS 78 72  --   BILITOT 1.0 0.0*  --   GFRNONAA 36* 42* 44*  GFRAA 42* 49* 51*  ANIONGAP 12 10 11      Hematology Recent Labs  Lab 11/14/19 1458 11/17/19 0939  WBC 11.2* 11.2*  RBC 4.42 4.54  HGB 13.7 14.3  HCT 42.6 43.1  MCV 96.4 94.9  MCH 31.0 31.5  MCHC 32.2 33.2  RDW 15.2 15.2  PLT 175 160    BNPNo results for input(s): BNP, PROBNP in the last 168 hours.   DDimer No results for input(s): DDIMER in the last 168 hours.   Radiology    ECHOCARDIOGRAM COMPLETE  Result Date: 11/15/2019   ECHOCARDIOGRAM REPORT   Patient Name:   Justin Parsons Date of Exam: 11/15/2019 Medical Rec #:  [id number]  Height:       74.0 in Accession #:    [id number]           Weight:       250.0 lb Date of Birth:  June 11, 1941            BSA:          2.39 m Patient Age:    38 years             BP:           105/68 mmHg Patient Gender: M                    HR:           93 bpm. Exam Location:  [facility] Procedure: 2D Echo, Cardiac Doppler and Color Doppler Indications:    Chest Pain 786.50 / R07.9  History:        Patient has prior history of Echocardiogram examinations, most                 recent 02/16/2018. CHF, CAD and Previous Myocardial Infarction;                 Arrythmias:Atrial Fibrillation. Alcoholic cirrhosis of liver                 without ascites.  Sonographer:    Alvino Chapel RCS Referring Phys: [id number] Lead  1. Left ventricular ejection fraction, by visual estimation, is 45 to 50%. The left ventricle has mildly decreased function. There is severely increased left ventricular hypertrophy.  2. Mild hypokinesis of the left ventricular, basal-mid anterolateral wall and inferolateral wall.  3. Left  ventricular diastolic function could not be evaluated.  4. The left ventricle demonstrates regional wall motion abnormalities.  5. Global right ventricle has normal systolic function.The right ventricular size is normal. No increase in right ventricular wall thickness.  6. Left atrial size was moderately dilated.  7. Right atrial size was normal.  8. Mild calcification of the anterior mitral valve leaflet(s).  9. Mild thickening of the anterior mitral valve leaflet(s). 10. The mitral valve is degenerative. Mild to moderate mitral valve regurgitation. 11. The tricuspid valve is normal in structure. 12. Aortic valve regurgitation is mild to moderate. 13. The aortic valve is tricuspid. Aortic valve regurgitation is mild to moderate. Mild to moderate aortic valve sclerosis/calcification without any evidence of aortic stenosis. 14. The pulmonic valve was not well visualized. Pulmonic valve regurgitation is not visualized. 15. Mildly elevated pulmonary artery systolic pressure. 16. The tricuspid regurgitant velocity is 2.89 m/s, and with an assumed right atrial pressure of 3 mmHg, the estimated right ventricular systolic pressure is mildly elevated at 36.4 mmHg. 17. There is a new lateral wall motion abnormality compared to 04/30/2017. 18. Prior images reviewed side by side. FINDINGS  Left Ventricle: Left ventricular ejection fraction, by visual estimation, is 45 to 50%. The left ventricle has mildly decreased function. Mild hypokinesis of the left ventricular, basal-mid anterolateral wall and inferolateral wall. The left ventricle demonstrates regional wall motion abnormalities. There is severely increased left ventricular hypertrophy. Concentric left ventricular hypertrophy. The left ventricular diastology could not be evaluated due to atrial fibrillation. Left ventricular diastolic function could not be evaluated. Right Ventricle: The right ventricular size is normal. No increase in right ventricular wall thickness.  Global RV systolic function is has normal systolic function. The tricuspid regurgitant velocity is 2.89 m/s, and with an assumed right atrial pressure  of 3 mmHg, the estimated right ventricular systolic pressure is mildly elevated at 36.4 mmHg. Left Atrium: Left atrial size was moderately dilated. Right Atrium: Right atrial size was normal in size Pericardium: There is no evidence of pericardial effusion. Mitral Valve: The mitral valve is degenerative in appearance. There is mild thickening of the anterior mitral valve leaflet(s). There is mild calcification of the anterior mitral valve leaflet(s). Mild to moderate mitral valve regurgitation, with centrally-directed jet. Tricuspid Valve: The tricuspid valve is normal in structure. Tricuspid valve regurgitation is mild. Aortic Valve: The aortic valve is tricuspid. . There is moderate thickening and moderate calcification of the aortic valve. Aortic valve regurgitation is mild to moderate. Mild to moderate aortic valve sclerosis/calcification is present, without any evidence of aortic stenosis. There is moderate thickening of the aortic valve. There is moderate calcification of the aortic valve. Pulmonic Valve: The pulmonic valve was not well visualized. Pulmonic valve regurgitation is not visualized. Pulmonic regurgitation is not visualized. Aorta: The aortic root and ascending aorta are structurally normal, with no evidence of dilitation. IAS/Shunts: No atrial level shunt detected by color flow Doppler.  LEFT VENTRICLE PLAX 2D LVIDd:         4.39 cm LVIDs:         2.94 cm LV PW:         1.45 cm LV IVS:        1.80 cm LVOT diam:     1.90 cm LV SV:         54 ml LV SV Index:   21.89 LVOT Area:     2.84 cm  LV Volumes (MOD) LV area d, A2C:    25.95 cm LV area d, A4C:    28.00 cm LV area s, A2C:    17.70 cm LV area s, A4C:    18.30 cm LV major d, A2C:   6.84 cm LV major d, A4C:   6.63 cm LV major s, A2C:   5.90 cm LV major s, A4C:   6.26 cm LV vol d, MOD A2C: 83.2  ml LV vol d, MOD A4C: 97.1 ml LV vol s, MOD A2C: 48.2 ml LV vol s, MOD A4C: 47.3 ml LV SV MOD A2C:     35.0 ml LV SV MOD A4C:     97.1 ml LV SV MOD BP:      42.2 ml RIGHT VENTRICLE TAPSE (M-mode): 1.5 cm LEFT ATRIUM              Index       RIGHT ATRIUM           Index LA diam:        4.00 cm  1.67 cm/m  RA Area:     20.10 cm LA Vol (A2C):   104.0 ml 43.52 ml/m RA Volume:   52.60 ml  22.01 ml/m LA Vol (A4C):   87.3 ml  36.53 ml/m LA Biplane Vol: 102.0 ml 42.68 ml/m  AORTIC VALVE LVOT Vmax:   93.25 cm/s LVOT Vmean:  57.700 cm/s LVOT VTI:    0.157 m  AORTA Ao Root diam: 3.50 cm MITRAL VALVE                        TRICUSPID VALVE MV Area (PHT): 4.49 cm             TR Peak grad:   33.4 mmHg MV PHT:        49.01 msec  TR Vmax:        289.00 cm/s MV Decel Time: 169 msec MV E velocity: 133.00 cm/s 103 cm/s SHUNTS                                     Systemic VTI:  0.16 m                                     Systemic Diam: 1.90 cm  Sanda Klein MD Electronically signed by Sanda Klein MD Signature Date/Time: 11/15/2019/4:36:58 PM    Final     Cardiac Studies   10/2019 echo IMPRESSIONS    1. Left ventricular ejection fraction, by visual estimation, is 45 to 50%. The left ventricle has mildly decreased function. There is severely increased left ventricular hypertrophy.  2. Mild hypokinesis of the left ventricular, basal-mid anterolateral wall and inferolateral wall.  3. Left ventricular diastolic function could not be evaluated.  4. The left ventricle demonstrates regional wall motion abnormalities.  5. Global right ventricle has normal systolic function.The right ventricular size is normal. No increase in right ventricular wall thickness.  6. Left atrial size was moderately dilated.  7. Right atrial size was normal.  8. Mild calcification of the anterior mitral valve leaflet(s).  9. Mild thickening of the anterior mitral valve leaflet(s). 10. The mitral valve is degenerative. Mild to  moderate mitral valve regurgitation. 11. The tricuspid valve is normal in structure. 12. Aortic valve regurgitation is mild to moderate. 13. The aortic valve is tricuspid. Aortic valve regurgitation is mild to moderate. Mild to moderate aortic valve sclerosis/calcification without any evidence of aortic stenosis. 14. The pulmonic valve was not well visualized. Pulmonic valve regurgitation is not visualized. 15. Mildly elevated pulmonary artery systolic pressure. 16. The tricuspid regurgitant velocity is 2.89 m/s, and with an assumed right atrial pressure of 3 mmHg, the estimated right ventricular systolic pressure is mildly elevated at 36.4 mmHg. 17. There is a new lateral wall motion abnormality compared to 04/30/2017. 18. Prior images reviewed side by side.      Patient Profile     TOMMEY BARRET is a 78 y.o. male with a hx of Bradycardia, atrial fib,  CKD, Rt eye blindness, chronic cirrhosis with portal gastropathy, secondary to NASH/ETOH, chronic diastolic HF and CAD with stent to ostial LAD 2018, and chronic RCA disease turned down for CABG  who is being seen today for the evaluation of chest pain at the request of Dr. Wynetta Emery.  Assessment & Plan    1. Epigastric pain/chest pain/CAD - symptoms are not consistent with cardiac ischema.   - burning lasting hours at a time worst with eating, has had ongoing N/V.  - Trops are not impressive, EKG is not specific for ischemic changes.  - echo reviewed, very sublte difficult to visualize potential changes from prior echo With his kidney disease, liver disease, and poor follow up history would have higher threshold to consider invasive testing in general - patient to be evaluated by GI  2. Afib Previously deemed not an anticoag candidate for his afib, this can be revisited if he estasblishes follow up  We will follow up GI eval peripherally, no further cardiac testing or interventions currently planned.    For questions or  updates, please contact [facility] Please consult [email or web address] for contact info under  Merrily Pew, MD  11/17/2019, 11:03 AM

## 2019-11-17 NOTE — Progress Notes (Signed)
PROGRESS NOTE [facility]  Justin Parsons  XBD:[id number]  DOB: 1941/04/25  DOA: 11/14/2019 PCP: Monico Blitz, MD  Brief Admission Hx: 78 year old male with extensive cardiac history, history of alcohol abuse, liver cirrhosis, portal vein thrombosis, chronic A. fib, right eye blindness, history of NSTEMI, CKD, hypertension, hypothyroidism, history of TIA, hiatal hernia presented to the ED with complaints of sharp chest pains and epigastric pains dizziness and nausea.  MDM/Assessment & Plan:   1. Atypical chest pain symptoms-his symptoms seem more epigastric and upper GI.  He was started on Protonix and given some medications for heartburn relief however he continues to complain of the pain.  He had nausea and vomiting on admission but seems to be improving.  He has been seen by cardiology and they do not feel that this is a cardiac issue.  His troponins have been fairly flat and not impressive.  Cardiology recommended a GI consult which I agree and requested consult.  I have asked for GI consult and will follow recommendations.   Continue Protonix. 2. Prolonged QT-avoid Zofran and other QT prolonging agents, magnesium IV has been given.  Follow mag level. 3. Hypomagnesemia-IV replacement ordered. 4. GERD-Protonix ordered for GI protection. 5. Stage IIIb CKD-stable. 6. Hypothyroidism-resumed home levothyroxine.  DVT prophylaxis: enoxaparin Code Status: full  Family Communication: patient  Disposition Plan: remain inpatient, GI consult  Consultants:  Cardiology  GI   Procedures:    Antimicrobials:     Subjective: Patient reports that he is not having pain or nausea or vomiting at this time.   Objective: Vitals:   11/16/19 1949 11/16/19 2021 11/17/19 0002 11/17/19 0359  BP:  102/76 104/78 111/82  Pulse:  81 82 100  Resp:  16 18 18   Temp:  98 F (36.7 C) 98.9 F (37.2 C) 98 F (36.7 C)  TempSrc:  Oral Oral Oral  SpO2: 98% 97% 97% 95%  Weight:       Height:        Intake/Output Summary (Last 24 hours) at 11/17/2019 1430 Last data filed at 11/17/2019 0606 Gross per 24 hour  Intake 480 ml  Output 350 ml  Net 130 ml   Filed Weights   11/14/19 1433 11/15/19 1947  Weight: 113.4 kg 88.4 kg   REVIEW OF SYSTEMS  As per history otherwise all reviewed and reported negative  Exam:  General exam: Chronically ill-appearing male, awake and alert. Respiratory system: Clear. No increased work of breathing. Cardiovascular system: S1 & S2 heard. No JVD, murmurs, gallops, clicks or pedal edema. Gastrointestinal system: Abdomen is nondistended, soft and nontender. Normal bowel sounds heard. Central nervous system: Alert and oriented. No focal neurological deficits. Extremities: no CCE.  Data Reviewed: Basic Metabolic Panel: Recent Labs  Lab 11/14/19 1458 11/14/19 1730 11/16/19 0005 11/16/19 0611 11/17/19 0939  NA 139  --  138  --  137  K 3.0*  --  3.2*  --  4.0  CL 100  --  99  --  100  CO2 27  --  29  --  26  GLUCOSE 105*  --  112*  --  94  BUN 20  --  21  --  18  CREATININE 1.76*  --  1.55*  --  1.49*  CALCIUM 8.9  --  8.8*  --  8.9  MG  --  1.3*  --  1.8  --   PHOS  --  2.8  --   --   --    Liver  Function Tests: Recent Labs  Lab 11/14/19 1458 11/16/19 0005  AST 16 16  ALT 10 10  ALKPHOS 78 72  BILITOT 1.0 0.0*  PROT 6.4* 6.2*  ALBUMIN 3.0* 2.9*   Recent Labs  Lab 11/14/19 1458  LIPASE 21   No results for input(s): AMMONIA in the last 168 hours. CBC: Recent Labs  Lab 11/14/19 1458 11/17/19 0939  WBC 11.2* 11.2*  HGB 13.7 14.3  HCT 42.6 43.1  MCV 96.4 94.9  PLT 175 160   Cardiac Enzymes: No results for input(s): CKTOTAL, CKMB, CKMBINDEX, TROPONINI in the last 168 hours. CBG (last 3)  No results for input(s): GLUCAP in the last 72 hours. Recent Results (from the past 240 hour(s))  SARS CORONAVIRUS 2 (TAT 6-24 HRS) Nasopharyngeal Nasopharyngeal Swab     Status: None   Collection Time: 11/14/19  9:00  PM   Specimen: Nasopharyngeal Swab  Result Value Ref Range Status   SARS Coronavirus 2 NEGATIVE NEGATIVE Final    Comment: (NOTE) SARS-CoV-2 target nucleic acids are NOT DETECTED. The SARS-CoV-2 RNA is generally detectable in upper and lower respiratory specimens during the acute phase of infection. Negative results do not preclude SARS-CoV-2 infection, do not rule out co-infections with other pathogens, and should not be used as the sole basis for treatment or other patient management decisions. Negative results must be combined with clinical observations, patient history, and epidemiological information. The expected result is Negative. Fact Sheet for Patients: SugarRoll.be Fact Sheet for Healthcare Providers: https://www.woods-mathews.com/ This test is not yet approved or cleared by the [place] FDA and  has been authorized for detection and/or diagnosis of SARS-CoV-2 by FDA under an Emergency Use Authorization (EUA). This EUA will remain  in effect (meaning this test can be used) for the duration of the COVID-19 declaration under Section 56 4(b)(1) of the Act, 21 U.S.C. section 360bbb-3(b)(1), unless the authorization is terminated or revoked sooner. Performed at [facility], [place] [address], [place], [place] [address]      Studies: ECHOCARDIOGRAM COMPLETE  Result Date: 11/15/2019   ECHOCARDIOGRAM REPORT   Patient Name:   Justin Parsons Date of Exam: 11/15/2019 Medical Rec #:  [id number]            Height:       74.0 in Accession #:    [id number]           Weight:       250.0 lb Date of Birth:  Nov 05, 1941            BSA:          2.39 m Patient Age:    42 years             BP:           105/68 mmHg Patient Gender: M                    HR:           93 bpm. Exam Location:  [facility] Procedure: 2D Echo, Cardiac Doppler and Color Doppler Indications:    Chest Pain 786.50 / R07.9  History:        Patient has prior history of  Echocardiogram examinations, most                 recent 02/16/2018. CHF, CAD and Previous Myocardial Infarction;                 Arrythmias:Atrial Fibrillation. Alcoholic cirrhosis  of liver                 without ascites.  Sonographer:    Alvino Chapel RCS Referring Phys: [id number] [place]  1. Left ventricular ejection fraction, by visual estimation, is 45 to 50%. The left ventricle has mildly decreased function. There is severely increased left ventricular hypertrophy.  2. Mild hypokinesis of the left ventricular, basal-mid anterolateral wall and inferolateral wall.  3. Left ventricular diastolic function could not be evaluated.  4. The left ventricle demonstrates regional wall motion abnormalities.  5. Global right ventricle has normal systolic function.The right ventricular size is normal. No increase in right ventricular wall thickness.  6. Left atrial size was moderately dilated.  7. Right atrial size was normal.  8. Mild calcification of the anterior mitral valve leaflet(s).  9. Mild thickening of the anterior mitral valve leaflet(s). 10. The mitral valve is degenerative. Mild to moderate mitral valve regurgitation. 11. The tricuspid valve is normal in structure. 12. Aortic valve regurgitation is mild to moderate. 13. The aortic valve is tricuspid. Aortic valve regurgitation is mild to moderate. Mild to moderate aortic valve sclerosis/calcification without any evidence of aortic stenosis. 14. The pulmonic valve was not well visualized. Pulmonic valve regurgitation is not visualized. 15. Mildly elevated pulmonary artery systolic pressure. 16. The tricuspid regurgitant velocity is 2.89 m/s, and with an assumed right atrial pressure of 3 mmHg, the estimated right ventricular systolic pressure is mildly elevated at 36.4 mmHg. 17. There is a new lateral wall motion abnormality compared to 04/30/2017. 18. Prior images reviewed side by side. FINDINGS  Left Ventricle: Left ventricular ejection  fraction, by visual estimation, is 45 to 50%. The left ventricle has mildly decreased function. Mild hypokinesis of the left ventricular, basal-mid anterolateral wall and inferolateral wall. The left ventricle demonstrates regional wall motion abnormalities. There is severely increased left ventricular hypertrophy. Concentric left ventricular hypertrophy. The left ventricular diastology could not be evaluated due to atrial fibrillation. Left ventricular diastolic function could not be evaluated. Right Ventricle: The right ventricular size is normal. No increase in right ventricular wall thickness. Global RV systolic function is has normal systolic function. The tricuspid regurgitant velocity is 2.89 m/s, and with an assumed right atrial pressure  of 3 mmHg, the estimated right ventricular systolic pressure is mildly elevated at 36.4 mmHg. Left Atrium: Left atrial size was moderately dilated. Right Atrium: Right atrial size was normal in size Pericardium: There is no evidence of pericardial effusion. Mitral Valve: The mitral valve is degenerative in appearance. There is mild thickening of the anterior mitral valve leaflet(s). There is mild calcification of the anterior mitral valve leaflet(s). Mild to moderate mitral valve regurgitation, with centrally-directed jet. Tricuspid Valve: The tricuspid valve is normal in structure. Tricuspid valve regurgitation is mild. Aortic Valve: The aortic valve is tricuspid. . There is moderate thickening and moderate calcification of the aortic valve. Aortic valve regurgitation is mild to moderate. Mild to moderate aortic valve sclerosis/calcification is present, without any evidence of aortic stenosis. There is moderate thickening of the aortic valve. There is moderate calcification of the aortic valve. Pulmonic Valve: The pulmonic valve was not well visualized. Pulmonic valve regurgitation is not visualized. Pulmonic regurgitation is not visualized. Aorta: The aortic root and  ascending aorta are structurally normal, with no evidence of dilitation. IAS/Shunts: No atrial level shunt detected by color flow Doppler.  LEFT VENTRICLE PLAX 2D LVIDd:         4.39 cm LVIDs:  2.94 cm LV PW:         1.45 cm LV IVS:        1.80 cm LVOT diam:     1.90 cm LV SV:         54 ml LV SV Index:   21.89 LVOT Area:     2.84 cm  LV Volumes (MOD) LV area d, A2C:    25.95 cm LV area d, A4C:    28.00 cm LV area s, A2C:    17.70 cm LV area s, A4C:    18.30 cm LV major d, A2C:   6.84 cm LV major d, A4C:   6.63 cm LV major s, A2C:   5.90 cm LV major s, A4C:   6.26 cm LV vol d, MOD A2C: 83.2 ml LV vol d, MOD A4C: 97.1 ml LV vol s, MOD A2C: 48.2 ml LV vol s, MOD A4C: 47.3 ml LV SV MOD A2C:     35.0 ml LV SV MOD A4C:     97.1 ml LV SV MOD BP:      42.2 ml RIGHT VENTRICLE TAPSE (M-mode): 1.5 cm LEFT ATRIUM              Index       RIGHT ATRIUM           Index LA diam:        4.00 cm  1.67 cm/m  RA Area:     20.10 cm LA Vol (A2C):   104.0 ml 43.52 ml/m RA Volume:   52.60 ml  22.01 ml/m LA Vol (A4C):   87.3 ml  36.53 ml/m LA Biplane Vol: 102.0 ml 42.68 ml/m  AORTIC VALVE LVOT Vmax:   93.25 cm/s LVOT Vmean:  57.700 cm/s LVOT VTI:    0.157 m  AORTA Ao Root diam: 3.50 cm MITRAL VALVE                        TRICUSPID VALVE MV Area (PHT): 4.49 cm             TR Peak grad:   33.4 mmHg MV PHT:        49.01 msec           TR Vmax:        289.00 cm/s MV Decel Time: 169 msec MV E velocity: 133.00 cm/s 103 cm/s SHUNTS                                     Systemic VTI:  0.16 m                                     Systemic Diam: 1.90 cm  Dani Gobble Croitoru MD Electronically signed by Sanda Klein MD Signature Date/Time: 11/15/2019/4:36:58 PM    Final    Scheduled Meds: . allopurinol  100 mg Oral Daily  . aspirin  81 mg Oral Daily  . atorvastatin  80 mg Oral q1800  . carvedilol  6.25 mg Oral BID WC  . cholecalciferol  1,000 Units Oral Daily  . folic acid  1 mg Oral Daily  . levothyroxine  75 mcg Oral QAC  breakfast  . metoCLOPramide  5 mg Oral BID AC  . pantoprazole  40 mg Oral BID  . polyethylene glycol  17 g  Oral Once  . potassium chloride  20 mEq Oral Daily  . torsemide  20 mg Oral Daily   Continuous Infusions:  Principal Problem:   Chest pain Active Problems:   GERD (gastroesophageal reflux disease)   CKD (chronic kidney disease), stage III   Hypothyroidism   Alcoholic cirrhosis of liver without ascites (HCC)   Atrial fibrillation, chronic (HCC)   Hypomagnesemia   CAD (coronary artery disease)   Prolonged QT interval   Epigastric pain  Time spent:   Irwin Brakeman, MD [facility] 11/17/2019, 2:30 PM    LOS: 2 days  How to contact the [facility] Attending or Consulting provider [place] or covering provider during after hours [place], for this patient?  1. Check the care team in [facility] and look for a) attending/consulting [facility] provider listed and b) the [facility] team listed 2. Log into [email or web address] and use [facility]'s universal password to access. If you do not have the password, please contact the hospital operator. 3. Locate the [facility] provider you are looking for under [facility] and page to a number that you can be directly reached. 4. If you still have difficulty reaching the provider, please page the [facility] (Director on Call) for the Hospitalists listed on amion for assistance.

## 2019-11-17 NOTE — Plan of Care (Signed)
  Problem: Education: Goal: Knowledge of General Education information will improve Description Including pain rating scale, medication(s)/side effects and non-pharmacologic comfort measures Outcome: Progressing   Problem: Health Behavior/Discharge Planning: Goal: Ability to manage health-related needs will improve Outcome: Progressing   

## 2019-11-17 NOTE — Discharge Summary (Signed)
Physician Discharge Summary  Justin Parsons GHW:[id number] DOB: 24-Oct-1941 DOA: 11/14/2019  PCP: Monico Blitz, MD GI: [facility]  Admit date: 11/14/2019 Discharge date: 11/17/2019  Admitted From:  Home  Disposition: Home   Recommendations for Outpatient Follow-up:  1. Follow up with PCP in 1 weeks 2. Follow up with GI at [facility] [place] Dr. Roney Mans for esophageal dilatation 3. Please obtain BMP/CBC in one week  Discharge Condition: STABLE   CODE STATUS: FULL    Brief Hospitalization Summary: Please see all hospital notes, images, labs for full details of the hospitalization. ADMISSION HPI: Justin Parsons is a 78 y.o. male with medical history significant of adenomatous polyps, history of alcohol use, cirrhosis of the liver, history of portal vein thrombosis, osteoarthritis, chronic atrial fibrillation, right eye blindness, CAD, history of NSTEMI, CKD, essential hypertension, hiatal hernia, hypothyroidism, TIA who is coming to the emergency department due to having sharp chest pain since yesterday evening associated with some dyspnea, mild dizziness and diaphoresis.  He does not know how long it lasted.  He does not know of any relieving or worsening factors as he spends most of his time on the wheelchair.  He called EMS earlier today who gave him 4 doses of nitroglycerin with near resolution of the pain.  He denies any significant pain at this time. He has not followed up with cardiology in the past 2 years.  He also complains of having upper abdominal pain and had 2 episodes of emesis in the last few days.  He has some history describing the pain, but answers simple questions.  He denies fever, chills, dyspnea, productive cough, wheezing or hemoptysis.  He has had some loose stools, but denies melena or hematochezia.  No dysuria, frequency or hematuria.  No polyuria, polydipsia, polyphagia or blurred vision.    ED Course: Initial vital signs temperature 98.2 F, pulse 95, respirations 16, blood  pressure 106/70 mmHg and O2 sat 97% on room air.  Patient received 20 mEq of potassium chloride and 2 mg of morphine IVP.  His EKG shows atrial fibrillation and is not significantly changed.  Troponin x2 have been 19 and 20 ng/L.  White count is 11.2, hemoglobin 13.7 g% platelets 175.  Hepatic function panel shows a total protein of 6.4 and albumin 3.4 g/dL.  The rest of the panel is unremarkable.  BMP shows a potassium of 3.0 mmol/L.  Magnesium 1.3 mg/dL.  All other electrolytes are normal.  Glucose 105, BUN 20 and creatinine 1.76 mg/dL.  Chest radiograph shows small left pleural effusion with slight secondary atelectasis of the left lung base.  This is unchanged from 3 days ago.  Please see images and full radiology report for further detail.    Brief Admission Hx: 78 year old male with extensive cardiac history, history of alcohol abuse, liver cirrhosis, portal vein thrombosis, chronic A. fib, right eye blindness, history of NSTEMI, CKD, hypertension, hypothyroidism, history of TIA, hiatal hernia presented to the ED with complaints of sharp chest pains and epigastric pains dizziness and nausea.  MDM/Assessment & Plan:   1. Atypical chest pain symptoms-his symptoms seem more epigastric and upper GI.  He was started on Protonix and given some medications for heartburn relief however he continues to complain of the pain.  He had nausea and vomiting on admission but seems to be improving.  He has been seen by cardiology and they do not feel that this is a cardiac issue.  His troponins have been fairly flat and not impressive.  Cardiology recommended  a GI consult which I agree and requested consult.  I have asked for GI consult and will follow recommendations.   Continue Protonix.  I spoke with GI Dr. Oneida Alar who says that patient will need to follow-up with his GI at [facility] to have the esophageal dilatation done there given that they have the particular equipment that is not available  here.  The patient verbalized understanding.  The patient's pills will need to be crushed so that he can swallow them better.  Patient should follow-up with his primary cardiologist Dr. Einar Gip. 2. Prolonged QT-avoid Zofran and other QT prolonging agents, magnesium IV has been given.  Follow mag level. 3. Hypomagnesemia-IV replacement ordered. 4. GERD-Protonix ordered for GI protection. 5. Stage IIIb CKD-stable. 6. Hypothyroidism-resumed home levothyroxine.  DVT prophylaxis: enoxaparin Code Status: full  Family Communication: patient  Disposition Plan:  Home  Consultants:  Cardiology  GI   Procedures:    Antimicrobials:    Discharge Diagnoses:  Principal Problem:   Chest pain Active Problems:   GERD (gastroesophageal reflux disease)   CKD (chronic kidney disease), stage III   Hypothyroidism   Alcoholic cirrhosis of liver without ascites (HCC)   Atrial fibrillation, chronic (HCC)   Hypomagnesemia   CAD (coronary artery disease)   Prolonged QT interval   Epigastric pain   Discharge Instructions:  Allergies as of 11/17/2019   No Known Allergies     Medication List    STOP taking these medications   clopidogrel 75 MG tablet Commonly known as: PLAVIX   diclofenac 75 MG EC tablet Commonly known as: VOLTAREN   diltiazem 120 MG 24 hr capsule Commonly known as: Cardizem CD   doxycycline 100 MG capsule Commonly known as: VIBRAMYCIN   folic acid 1 MG tablet Commonly known as: FOLVITE   polyethylene glycol 17 g packet Commonly known as: MIRALAX / GLYCOLAX     TAKE these medications   allopurinol 300 MG tablet Commonly known as: ZYLOPRIM Take 300 mg by mouth daily. What changed: Another medication with the same name was removed. Continue taking this medication, and follow the directions you see here.   atorvastatin 80 MG tablet Commonly known as: LIPITOR Take 80 mg by mouth daily at 6 PM.   carvedilol 6.25 MG tablet Commonly known as: COREG Take 1  tablet (6.25 mg total) by mouth 2 (two) times daily. What changed:   medication strength  how much to take   gabapentin 300 MG capsule Commonly known as: NEURONTIN Take 300 mg by mouth 3 (three) times daily as needed.   levothyroxine 75 MCG tablet Commonly known as: SYNTHROID Take 75 mcg by mouth daily before breakfast.   metoCLOPramide 5 MG tablet Commonly known as: Reglan 1 po 30 minutes prior to meals bid What changed:   how much to take  how to take this  when to take this   pantoprazole 40 MG tablet Commonly known as: PROTONIX Take 40 mg by mouth 2 (two) times daily.   potassium chloride 10 MEQ tablet Commonly known as: KLOR-CON Take 1 tablet (10 mEq total) by mouth daily. Start taking on: November 18, 2019   torsemide 20 MG tablet Commonly known as: DEMADEX Take 20 mg by mouth daily.   traMADol 50 MG tablet Commonly known as: ULTRAM Take 1 tablet (50 mg total) by mouth every 6 (six) hours as needed. What changed: when to take this   Vitamin D3 50 MCG (2000 UT) Chew Chew by mouth daily.  Follow-up Information    Adrian Prows, MD. Call.   Specialty: Cardiology Why: We will also call you to set up appointment to see [place] in 10 days Contact information: [place] [address] [phone]        Virgia Land, MD. Schedule an appointment as soon as possible for a visit in 1 week(s).   Specialty: Internal Medicine Why: Hospital Follow up to get esophageal dilatation Contact information: [address] [place] [address] [phone]          No Known Allergies Allergies as of 11/17/2019   No Known Allergies     Medication List    STOP taking these medications   clopidogrel 75 MG tablet Commonly known as: PLAVIX   diclofenac 75 MG EC tablet Commonly known as: VOLTAREN   diltiazem 120 MG 24 hr capsule Commonly known as: Cardizem CD   doxycycline 100 MG capsule Commonly known as:  VIBRAMYCIN   folic acid 1 MG tablet Commonly known as: FOLVITE   polyethylene glycol 17 g packet Commonly known as: MIRALAX / GLYCOLAX     TAKE these medications   allopurinol 300 MG tablet Commonly known as: ZYLOPRIM Take 300 mg by mouth daily. What changed: Another medication with the same name was removed. Continue taking this medication, and follow the directions you see here.   atorvastatin 80 MG tablet Commonly known as: LIPITOR Take 80 mg by mouth daily at 6 PM.   carvedilol 6.25 MG tablet Commonly known as: COREG Take 1 tablet (6.25 mg total) by mouth 2 (two) times daily. What changed:   medication strength  how much to take   gabapentin 300 MG capsule Commonly known as: NEURONTIN Take 300 mg by mouth 3 (three) times daily as needed.   levothyroxine 75 MCG tablet Commonly known as: SYNTHROID Take 75 mcg by mouth daily before breakfast.   metoCLOPramide 5 MG tablet Commonly known as: Reglan 1 po 30 minutes prior to meals bid What changed:   how much to take  how to take this  when to take this   pantoprazole 40 MG tablet Commonly known as: PROTONIX Take 40 mg by mouth 2 (two) times daily.   potassium chloride 10 MEQ tablet Commonly known as: KLOR-CON Take 1 tablet (10 mEq total) by mouth daily. Start taking on: November 18, 2019   torsemide 20 MG tablet Commonly known as: DEMADEX Take 20 mg by mouth daily.   traMADol 50 MG tablet Commonly known as: ULTRAM Take 1 tablet (50 mg total) by mouth every 6 (six) hours as needed. What changed: when to take this   Vitamin D3 50 MCG (2000 UT) Chew Chew by mouth daily.       Procedures/Studies: DG Chest 2 View  Result Date: 11/14/2019 CLINICAL DATA:  Chest pain. Vomiting. EXAM: CHEST - 2 VIEW COMPARISON:  Chest x-rays dated 11/11/2019 02/16/2018 and 01/05/2018 FINDINGS: There is a small left pleural effusion. Secondary atelectasis at the left lung base. Tiny right effusion. Heart size and  vascularity are normal. No significant bone abnormality. IMPRESSION: 1. Small left pleural effusion with slight secondary atelectasis at the left lung base, unchanged since the prior study of 11/11/2019. 2. Tiny right effusion, unchanged. Electronically Signed   By: Lorriane Shire M.D.   On: 11/14/2019 15:05   Acute Abd Series  Result Date: 11/11/2019 CLINICAL DATA:  Abdominal pain and constipation EXAM: DG ABDOMEN ACUTE W/ 1V CHEST COMPARISON:  10/29/2019.  Chest 02/16/2018  FINDINGS: Left lower lobe airspace disease and small left effusion has developed since the prior study. Small right effusion. Negative for heart failure or edema Nonobstructive bowel gas pattern. Moderate amount of stool in the colon and rectum diffusely. No free air under the diaphragm Bilateral hip replacement. Multilevel degenerative change throughout the lumbar spine. IMPRESSION: Left lower lobe airspace disease and left effusion. Possible pneumonia or recent heart failure. Small right effusion Retained stool in the colon without bowel obstruction. Electronically Signed   By: Franchot Gallo M.D.   On: 11/11/2019 14:12   ECHOCARDIOGRAM COMPLETE  Result Date: 11/15/2019   ECHOCARDIOGRAM REPORT   Patient Name:   KABEER HOAGLAND Date of Exam: 11/15/2019 Medical Rec #:  [id number]            Height:       74.0 in Accession #:    [id number]           Weight:       250.0 lb Date of Birth:  1941/08/18            BSA:          2.39 m Patient Age:    78 years             BP:           105/68 mmHg Patient Gender: M                    HR:           93 bpm. Exam Location:  [facility] Procedure: 2D Echo, Cardiac Doppler and Color Doppler Indications:    Chest Pain 786.50 / R07.9  History:        Patient has prior history of Echocardiogram examinations, most                 recent 02/16/2018. CHF, CAD and Previous Myocardial Infarction;                 Arrythmias:Atrial Fibrillation. Alcoholic cirrhosis of liver                 without  ascites.  Sonographer:    Alvino Chapel RCS Referring Phys: [id number] [place]  1. Left ventricular ejection fraction, by visual estimation, is 45 to 50%. The left ventricle has mildly decreased function. There is severely increased left ventricular hypertrophy.  2. Mild hypokinesis of the left ventricular, basal-mid anterolateral wall and inferolateral wall.  3. Left ventricular diastolic function could not be evaluated.  4. The left ventricle demonstrates regional wall motion abnormalities.  5. Global right ventricle has normal systolic function.The right ventricular size is normal. No increase in right ventricular wall thickness.  6. Left atrial size was moderately dilated.  7. Right atrial size was normal.  8. Mild calcification of the anterior mitral valve leaflet(s).  9. Mild thickening of the anterior mitral valve leaflet(s). 10. The mitral valve is degenerative. Mild to moderate mitral valve regurgitation. 11. The tricuspid valve is normal in structure. 12. Aortic valve regurgitation is mild to moderate. 13. The aortic valve is tricuspid. Aortic valve regurgitation is mild to moderate. Mild to moderate aortic valve sclerosis/calcification without any evidence of aortic stenosis. 14. The pulmonic valve was not well visualized. Pulmonic valve regurgitation is not visualized. 15. Mildly elevated pulmonary artery systolic pressure. 16. The tricuspid regurgitant velocity is 2.89 m/s, and with an assumed right atrial pressure of 3 mmHg, the estimated right ventricular systolic pressure is  mildly elevated at 36.4 mmHg. 17. There is a new lateral wall motion abnormality compared to 04/30/2017. 18. Prior images reviewed side by side. FINDINGS  Left Ventricle: Left ventricular ejection fraction, by visual estimation, is 45 to 50%. The left ventricle has mildly decreased function. Mild hypokinesis of the left ventricular, basal-mid anterolateral wall and inferolateral wall. The left ventricle  demonstrates regional wall motion abnormalities. There is severely increased left ventricular hypertrophy. Concentric left ventricular hypertrophy. The left ventricular diastology could not be evaluated due to atrial fibrillation. Left ventricular diastolic function could not be evaluated. Right Ventricle: The right ventricular size is normal. No increase in right ventricular wall thickness. Global RV systolic function is has normal systolic function. The tricuspid regurgitant velocity is 2.89 m/s, and with an assumed right atrial pressure  of 3 mmHg, the estimated right ventricular systolic pressure is mildly elevated at 36.4 mmHg. Left Atrium: Left atrial size was moderately dilated. Right Atrium: Right atrial size was normal in size Pericardium: There is no evidence of pericardial effusion. Mitral Valve: The mitral valve is degenerative in appearance. There is mild thickening of the anterior mitral valve leaflet(s). There is mild calcification of the anterior mitral valve leaflet(s). Mild to moderate mitral valve regurgitation, with centrally-directed jet. Tricuspid Valve: The tricuspid valve is normal in structure. Tricuspid valve regurgitation is mild. Aortic Valve: The aortic valve is tricuspid. . There is moderate thickening and moderate calcification of the aortic valve. Aortic valve regurgitation is mild to moderate. Mild to moderate aortic valve sclerosis/calcification is present, without any evidence of aortic stenosis. There is moderate thickening of the aortic valve. There is moderate calcification of the aortic valve. Pulmonic Valve: The pulmonic valve was not well visualized. Pulmonic valve regurgitation is not visualized. Pulmonic regurgitation is not visualized. Aorta: The aortic root and ascending aorta are structurally normal, with no evidence of dilitation. IAS/Shunts: No atrial level shunt detected by color flow Doppler.  LEFT VENTRICLE PLAX 2D LVIDd:         4.39 cm LVIDs:         2.94 cm LV  PW:         1.45 cm LV IVS:        1.80 cm LVOT diam:     1.90 cm LV SV:         54 ml LV SV Index:   21.89 LVOT Area:     2.84 cm  LV Volumes (MOD) LV area d, A2C:    25.95 cm LV area d, A4C:    28.00 cm LV area s, A2C:    17.70 cm LV area s, A4C:    18.30 cm LV major d, A2C:   6.84 cm LV major d, A4C:   6.63 cm LV major s, A2C:   5.90 cm LV major s, A4C:   6.26 cm LV vol d, MOD A2C: 83.2 ml LV vol d, MOD A4C: 97.1 ml LV vol s, MOD A2C: 48.2 ml LV vol s, MOD A4C: 47.3 ml LV SV MOD A2C:     35.0 ml LV SV MOD A4C:     97.1 ml LV SV MOD BP:      42.2 ml RIGHT VENTRICLE TAPSE (M-mode): 1.5 cm LEFT ATRIUM              Index       RIGHT ATRIUM           Index LA diam:        4.00 cm  1.67  cm/m  RA Area:     20.10 cm LA Vol (A2C):   104.0 ml 43.52 ml/m RA Volume:   52.60 ml  22.01 ml/m LA Vol (A4C):   87.3 ml  36.53 ml/m LA Biplane Vol: 102.0 ml 42.68 ml/m  AORTIC VALVE LVOT Vmax:   93.25 cm/s LVOT Vmean:  57.700 cm/s LVOT VTI:    0.157 m  AORTA Ao Root diam: 3.50 cm MITRAL VALVE                        TRICUSPID VALVE MV Area (PHT): 4.49 cm             TR Peak grad:   33.4 mmHg MV PHT:        49.01 msec           TR Vmax:        289.00 cm/s MV Decel Time: 169 msec MV E velocity: 133.00 cm/s 103 cm/s SHUNTS                                     Systemic VTI:  0.16 m                                     Systemic Diam: 1.90 cm  Dani Gobble Croitoru MD Electronically signed by Sanda Klein MD Signature Date/Time: 11/15/2019/4:36:58 PM    Final       Subjective: The patient reports he is not having any chest pain symptoms or indigestion.  He has been eating and tolerating food and no nausea.  Discharge Exam: Vitals:   11/17/19 0359 11/17/19 1438  BP: 111/82 116/90  Pulse: 100 84  Resp: 18 20  Temp: 98 F (36.7 C) 98.7 F (37.1 C)  SpO2: 95% 96%   Vitals:   11/16/19 2021 11/17/19 0002 11/17/19 0359 11/17/19 1438  BP: 102/76 104/78 111/82 116/90  Pulse: 81 82 100 84  Resp: 16 18 18 20   Temp: 98 F  (36.7 C) 98.9 F (37.2 C) 98 F (36.7 C) 98.7 F (37.1 C)  TempSrc: Oral Oral Oral Oral  SpO2: 97% 97% 95% 96%  Weight:      Height:       General: Pt is alert, awake, not in acute distress Cardiovascular: RRR, S1/S2 +, no rubs, no gallops Respiratory: CTA bilaterally, no wheezing, no rhonchi Abdominal: Soft, NT, ND, bowel sounds + Extremities: no edema, no cyanosis   The results of significant diagnostics from this hospitalization (including imaging, microbiology, ancillary and laboratory) are listed below for reference.     Microbiology: Recent Results (from the past 240 hour(s))  SARS CORONAVIRUS 2 (TAT 6-24 HRS) Nasopharyngeal Nasopharyngeal Swab     Status: None   Collection Time: 11/14/19  9:00 PM   Specimen: Nasopharyngeal Swab  Result Value Ref Range Status   SARS Coronavirus 2 NEGATIVE NEGATIVE Final    Comment: (NOTE) SARS-CoV-2 target nucleic acids are NOT DETECTED. The SARS-CoV-2 RNA is generally detectable in upper and lower respiratory specimens during the acute phase of infection. Negative results do not preclude SARS-CoV-2 infection, do not rule out co-infections with other pathogens, and should not be used as the sole basis for treatment or other patient management decisions. Negative results must be combined with clinical observations, patient history, and epidemiological information. The expected result  is Negative. Fact Sheet for Patients: SugarRoll.be Fact Sheet for Healthcare Providers: https://www.woods-mathews.com/ This test is not yet approved or cleared by the [place] FDA and  has been authorized for detection and/or diagnosis of SARS-CoV-2 by FDA under an Emergency Use Authorization (EUA). This EUA will remain  in effect (meaning this test can be used) for the duration of the COVID-19 declaration under Section 56 4(b)(1) of the Act, 21 U.S.C. section 360bbb-3(b)(1), unless the authorization is  terminated or revoked sooner. Performed at [facility], [place] [address], [place], [place] [address]      Labs: BNP (last 3 results) No results for input(s): BNP in the last 8760 hours. Basic Metabolic Panel: Recent Labs  Lab 11/14/19 1458 11/14/19 1730 11/16/19 0005 11/16/19 0611 11/17/19 0939 11/17/19 1440  NA 139  --  138  --  137  --   K 3.0*  --  3.2*  --  4.0  --   CL 100  --  99  --  100  --   CO2 27  --  29  --  26  --   GLUCOSE 105*  --  112*  --  94  --   BUN 20  --  21  --  18  --   CREATININE 1.76*  --  1.55*  --  1.49*  --   CALCIUM 8.9  --  8.8*  --  8.9  --   MG  --  1.3*  --  1.8  --  1.7  PHOS  --  2.8  --   --   --   --    Liver Function Tests: Recent Labs  Lab 11/14/19 1458 11/16/19 0005  AST 16 16  ALT 10 10  ALKPHOS 78 72  BILITOT 1.0 0.0*  PROT 6.4* 6.2*  ALBUMIN 3.0* 2.9*   Recent Labs  Lab 11/14/19 1458  LIPASE 21   Recent Labs  Lab 11/17/19 1532  AMMONIA 15   CBC: Recent Labs  Lab 11/14/19 1458 11/17/19 0939  WBC 11.2* 11.2*  HGB 13.7 14.3  HCT 42.6 43.1  MCV 96.4 94.9  PLT 175 160   Cardiac Enzymes: No results for input(s): CKTOTAL, CKMB, CKMBINDEX, TROPONINI in the last 168 hours. BNP: Invalid input(s): POCBNP CBG: No results for input(s): GLUCAP in the last 168 hours. D-Dimer No results for input(s): DDIMER in the last 72 hours. Hgb A1c No results for input(s): HGBA1C in the last 72 hours. Lipid Profile No results for input(s): CHOL, HDL, LDLCALC, TRIG, CHOLHDL, LDLDIRECT in the last 72 hours. Thyroid function studies No results for input(s): TSH, T4TOTAL, T3FREE, THYROIDAB in the last 72 hours.  Invalid input(s): FREET3 Anemia work up No results for input(s): VITAMINB12, FOLATE, FERRITIN, TIBC, IRON, RETICCTPCT in the last 72 hours. Urinalysis    Component Value Date/Time   COLORURINE YELLOW 11/14/2019 0349   APPEARANCEUR CLEAR 11/14/2019 0349   LABSPEC 1.017 11/14/2019 0349   PHURINE 5.0  11/14/2019 0349   GLUCOSEU NEGATIVE 11/14/2019 0349   HGBUR NEGATIVE 11/14/2019 0349   BILIRUBINUR NEGATIVE 11/14/2019 0349   KETONESUR 5 (A) 11/14/2019 0349   PROTEINUR NEGATIVE 11/14/2019 0349   UROBILINOGEN 0.2 11/12/2013 2315   NITRITE NEGATIVE 11/14/2019 0349   LEUKOCYTESUR TRACE (A) 11/14/2019 0349   Sepsis Labs Invalid input(s): PROCALCITONIN,  WBC,  LACTICIDVEN Microbiology Recent Results (from the past 240 hour(s))  SARS CORONAVIRUS 2 (TAT 6-24 HRS) Nasopharyngeal Nasopharyngeal Swab     Status: None  Collection Time: 11/14/19  9:00 PM   Specimen: Nasopharyngeal Swab  Result Value Ref Range Status   SARS Coronavirus 2 NEGATIVE NEGATIVE Final    Comment: (NOTE) SARS-CoV-2 target nucleic acids are NOT DETECTED. The SARS-CoV-2 RNA is generally detectable in upper and lower respiratory specimens during the acute phase of infection. Negative results do not preclude SARS-CoV-2 infection, do not rule out co-infections with other pathogens, and should not be used as the sole basis for treatment or other patient management decisions. Negative results must be combined with clinical observations, patient history, and epidemiological information. The expected result is Negative. Fact Sheet for Patients: SugarRoll.be Fact Sheet for Healthcare Providers: https://www.woods-mathews.com/ This test is not yet approved or cleared by the [place] FDA and  has been authorized for detection and/or diagnosis of SARS-CoV-2 by FDA under an Emergency Use Authorization (EUA). This EUA will remain  in effect (meaning this test can be used) for the duration of the COVID-19 declaration under Section 56 4(b)(1) of the Act, 21 U.S.C. section 360bbb-3(b)(1), unless the authorization is terminated or revoked sooner. Performed at [facility], [place] [address], [place], [place] [address]    Time coordinating discharge: 32 minutes    SIGNED:  Irwin Brakeman, MD  [facility] 11/17/2019, 5:34 PM How to contact the [facility] Attending or Consulting provider [place] or covering provider during after hours [place], for this patient?  1. Check the care team in [facility] and look for a) attending/consulting [facility] provider listed and b) the [facility] team listed 2. Log into [email or web address] and use [place]'s universal password to access. If you do not have the password, please contact the hospital operator. 3. Locate the [facility] provider you are looking for under [facility] and page to a number that you can be directly reached. 4. If you still have difficulty reaching the provider, please page the [facility] (Director on Call) for the Hospitalists listed on amion for assistance.

## 2019-11-18 ENCOUNTER — Encounter: Payer: Self-pay | Admitting: Internal Medicine

## 2019-11-18 ENCOUNTER — Telehealth: Payer: Self-pay | Admitting: Gastroenterology

## 2019-11-18 NOTE — Plan of Care (Signed)

## 2019-11-18 NOTE — Telephone Encounter (Signed)
Please arrange outpatient hospital follow-up for 4 weeks.

## 2019-11-18 NOTE — Plan of Care (Signed)
  Problem: Education: Goal: Knowledge of General Education information will improve Description: Including pain rating scale, medication(s)/side effects and non-pharmacologic comfort measures 11/18/2019 0946 by Santa Lighter, RN Outcome: Adequate for Discharge 11/18/2019 0945 by Santa Lighter, RN Outcome: Progressing   Problem: Health Behavior/Discharge Planning: Goal: Ability to manage health-related needs will improve 11/18/2019 0946 by Santa Lighter, RN Outcome: Adequate for Discharge 11/18/2019 0945 by Santa Lighter, RN Outcome: Progressing   Problem: Clinical Measurements: Goal: Ability to maintain clinical measurements within normal limits will improve 11/18/2019 0946 by Santa Lighter, RN Outcome: Adequate for Discharge 11/18/2019 0945 by Santa Lighter, RN Outcome: Progressing Goal: Will remain free from infection Outcome: Adequate for Discharge Goal: Diagnostic test results will improve Outcome: Adequate for Discharge Goal: Respiratory complications will improve Outcome: Adequate for Discharge Goal: Cardiovascular complication will be avoided Outcome: Adequate for Discharge   Problem: Activity: Goal: Risk for activity intolerance will decrease Outcome: Adequate for Discharge   Problem: Nutrition: Goal: Adequate nutrition will be maintained Outcome: Adequate for Discharge   Problem: Coping: Goal: Level of anxiety will decrease Outcome: Adequate for Discharge   Problem: Elimination: Goal: Will not experience complications related to bowel motility Outcome: Adequate for Discharge Goal: Will not experience complications related to urinary retention Outcome: Adequate for Discharge   Problem: Pain Managment: Goal: General experience of comfort will improve Outcome: Adequate for Discharge   Problem: Safety: Goal: Ability to remain free from injury will improve Outcome: Adequate for Discharge   Problem: Skin Integrity: Goal: Risk for impaired skin  integrity will decrease Outcome: Adequate for Discharge

## 2019-11-18 NOTE — Progress Notes (Signed)
Nsg Discharge Note  Admit Date:  11/14/2019 Discharge date: 11/18/2019   Justin Parsons to be D/C'd Home per MD order.  AVS completed.  Copy for chart, and copy for patient signed, and dated. Removed IV-clean, dry, intact. Reviewed d/c paperwork with patient. Answered all questions. Stable patient wheeled to main entrance where he was picked up by friend to d/c to home. Patient/caregiver able to verbalize understanding.  Discharge Medication: Allergies as of 11/18/2019   No Known Allergies     Medication List    STOP taking these medications   clopidogrel 75 MG tablet Commonly known as: PLAVIX   diclofenac 75 MG EC tablet Commonly known as: VOLTAREN   diltiazem 120 MG 24 hr capsule Commonly known as: Cardizem CD   doxycycline 100 MG capsule Commonly known as: VIBRAMYCIN   folic acid 1 MG tablet Commonly known as: FOLVITE   polyethylene glycol 17 g packet Commonly known as: MIRALAX / GLYCOLAX     TAKE these medications   allopurinol 300 MG tablet Commonly known as: ZYLOPRIM Take 300 mg by mouth daily. What changed: Another medication with the same name was removed. Continue taking this medication, and follow the directions you see here.   atorvastatin 80 MG tablet Commonly known as: LIPITOR Take 80 mg by mouth daily at 6 PM.   carvedilol 6.25 MG tablet Commonly known as: COREG Take 1 tablet (6.25 mg total) by mouth 2 (two) times daily. What changed:   medication strength  how much to take   gabapentin 300 MG capsule Commonly known as: NEURONTIN Take 300 mg by mouth 3 (three) times daily as needed.   levothyroxine 75 MCG tablet Commonly known as: SYNTHROID Take 75 mcg by mouth daily before breakfast.   metoCLOPramide 5 MG tablet Commonly known as: Reglan 1 po 30 minutes prior to meals bid What changed:   how much to take  how to take this  when to take this   pantoprazole 40 MG tablet Commonly known as: PROTONIX Take 40 mg by mouth 2  (two) times daily.   potassium chloride 10 MEQ tablet Commonly known as: KLOR-CON Take 1 tablet (10 mEq total) by mouth daily.   torsemide 20 MG tablet Commonly known as: DEMADEX Take 20 mg by mouth daily.   traMADol 50 MG tablet Commonly known as: ULTRAM Take 1 tablet (50 mg total) by mouth every 6 (six) hours as needed. What changed: when to take this   Vitamin D3 50 MCG (2000 UT) Chew Chew by mouth daily.       Discharge Assessment: Vitals:   11/18/19 0007 11/18/19 0401  BP: 105/77 119/90  Pulse: 65 73  Resp: 20 18  Temp: (!) 97.5 F (36.4 C) (!) 97.4 F (36.3 C)  SpO2: 96% 96%   Skin clean, dry and intact without evidence of skin break down, no evidence of skin tears noted. IV catheter discontinued intact. Site without signs and symptoms of complications - no redness or edema noted at insertion site, patient denies c/o pain - only slight tenderness at site.  Dressing with slight pressure applied.  D/c Instructions-Education: Discharge instructions given to patient/family with verbalized understanding. D/c education completed with patient/family including follow up instructions, medication list, d/c activities limitations if indicated, with other d/c instructions as indicated by MD - patient able to verbalize understanding, all questions fully answered. Patient instructed to return to ED, call 911, or call MD for any changes in condition.  Patient escorted via WC, and D/C  home via private auto.  Santa Lighter, RN 11/18/2019 11:08 AM

## 2019-11-24 DIAGNOSIS — Z789 Other specified health status: Secondary | ICD-10-CM | POA: Diagnosis not present

## 2019-11-24 DIAGNOSIS — Z299 Encounter for prophylactic measures, unspecified: Secondary | ICD-10-CM | POA: Diagnosis not present

## 2019-11-24 DIAGNOSIS — K7030 Alcoholic cirrhosis of liver without ascites: Secondary | ICD-10-CM | POA: Diagnosis not present

## 2019-11-24 DIAGNOSIS — K5900 Constipation, unspecified: Secondary | ICD-10-CM | POA: Diagnosis not present

## 2019-11-24 DIAGNOSIS — Z6834 Body mass index (BMI) 34.0-34.9, adult: Secondary | ICD-10-CM | POA: Diagnosis not present

## 2019-11-24 DIAGNOSIS — M545 Low back pain: Secondary | ICD-10-CM | POA: Diagnosis not present

## 2019-11-26 ENCOUNTER — Ambulatory Visit: Payer: Self-pay | Admitting: Cardiology

## 2019-12-11 ENCOUNTER — Ambulatory Visit: Payer: Medicare Other | Admitting: Cardiology

## 2019-12-17 ENCOUNTER — Ambulatory Visit: Payer: Medicare Other | Admitting: Gastroenterology

## 2019-12-21 ENCOUNTER — Other Ambulatory Visit: Payer: Self-pay

## 2019-12-21 ENCOUNTER — Encounter: Payer: Self-pay | Admitting: Cardiology

## 2019-12-21 ENCOUNTER — Ambulatory Visit (INDEPENDENT_AMBULATORY_CARE_PROVIDER_SITE_OTHER): Payer: Medicare Other | Admitting: Cardiology

## 2019-12-21 VITALS — BP 91/64 | HR 61 | Temp 97.9°F | Ht 69.0 in

## 2019-12-21 DIAGNOSIS — I4891 Unspecified atrial fibrillation: Secondary | ICD-10-CM

## 2019-12-21 DIAGNOSIS — I2510 Atherosclerotic heart disease of native coronary artery without angina pectoris: Secondary | ICD-10-CM | POA: Diagnosis not present

## 2019-12-21 DIAGNOSIS — I38 Endocarditis, valve unspecified: Secondary | ICD-10-CM

## 2019-12-21 NOTE — Progress Notes (Signed)
Clinical Summary Justin Parsons is a 79 y.o.male  1. CAD history of CAD with cath 04/2017 with ostial LAD disease, D1 occluded, OM1 occluded. RCA large/ectatic with extensive thrombus. Seen by surgery, thought to not a CABG candidate.  He received PCI of ostial LAD. Could not cross the occluded D1 with wire. Remaining disease was treated medically    - recent admission with symptoms are not consistent with cardiac ischema.  - burning lasting hours at a time worst with eating, has had ongoing N/V.  - Trops are not impressive, EKG is not specific for ischemic changes.  - echo reviewed, very sublte difficult to visualize potential changes from prior echo With his kidney disease, liver disease, and poor follow up history would have higher threshold to consider invasive testing in general - patient to be evaluated by GI  - epigastric pain has resolved since discharge.   2. Afib  Not felt to be a candidate for anticoagulation due to cirrhosis and Etoh use.  - no recent palpitations.    3. 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   4. 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 Past Medical History:  Diagnosis Date  . Adenomatous polyp of colon   . Alcoholism ([place])    [place]  . Arthritis   . Atrial fibrillation ([place])   . Blindness of right eye 1997  . CAD (coronary artery disease)    Multivessel disease at cardiac catheterization June 2018, DES to LAD June 2018 with significant residual disease including diagonal and obtuse marginal vessels as well as RCA  . Cirrhosis of liver ([facility])    Associated portal gastropathy  . CKD (chronic kidney disease) stage 3, GFR 30-59 ml/min   . Essential hypertension   . Hiatal hernia   . Hypothyroidism   . 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 TIA (transient ischemic attack)      No Known Allergies   Current Outpatient Medications  Medication Sig Dispense Refill  .  allopurinol (ZYLOPRIM) 300 MG tablet Take 300 mg by mouth daily.    Marland Kitchen atorvastatin (LIPITOR) 80 MG tablet Take 80 mg by mouth daily at 6 PM.     . carvedilol (COREG) 6.25 MG tablet Take 1 tablet (6.25 mg total) by mouth 2 (two) times daily. 60 tablet 0  . Cholecalciferol (VITAMIN D3) 2000 units CHEW Chew by mouth daily.    Marland Kitchen gabapentin (NEURONTIN) 300 MG capsule Take 300 mg by mouth 3 (three) times daily as needed.    Marland Kitchen levothyroxine (SYNTHROID) 75 MCG tablet Take 75 mcg by mouth daily before breakfast.     . metoCLOPramide (REGLAN) 5 MG tablet 1 po 30 minutes prior to meals bid (Patient taking differently: Take 5 mg by mouth 2 (two) times daily before a meal. 1 po 30 minutes prior to meals bid) 60 tablet 11  . pantoprazole (PROTONIX) 40 MG tablet Take 40 mg by mouth 2 (two) times daily.    . potassium chloride SA (KLOR-CON) 10 MEQ tablet Take 1 tablet (10 mEq total) by mouth daily. 30 tablet 0  . torsemide (DEMADEX) 20 MG tablet Take 20 mg by mouth daily.     . traMADol (ULTRAM) 50 MG tablet Take 1 tablet (50 mg total) by mouth every 6 (six) hours as needed. (Patient taking differently: Take 50 mg by mouth 3 (three) times daily. ) 6 tablet 0   No current facility-administered medications for this visit.  Past Surgical History:  Procedure Laterality Date  . BIOPSY  12/23/2017   Procedure: BIOPSY;  Surgeon: Daneil Dolin, MD;  Location: [facility];  Service: Endoscopy;;  esophagus  . CARDIAC CATHETERIZATION  2003  . COLONOSCOPY  08/2008   normal, repeat exam in 5-7 years  . COLONOSCOPY  2004   rectal adenomatous polyp  . COLONOSCOPY N/A 12/01/2014   PYK:[id number] rectum/elongated colon  . CORONARY ATHERECTOMY N/A 05/03/2017   Procedure: Coronary Atherectomy;  Surgeon: Leonie Man, MD;  Location: [facility];  Service: Cardiovascular;  Laterality: N/A;  . CORONARY STENT INTERVENTION N/A 05/03/2017   Procedure: Coronary Stent Intervention;  Surgeon: Leonie Man, MD;   Location: [facility];  Service: Cardiovascular;  Laterality: N/A;  . ESOPHAGEAL DILATION N/A 02/07/2015   Procedure: ESOPHAGEAL DILATION;  Surgeon: Daneil Dolin, MD;  [facility]: [facility];  Service: Endoscopy;  Laterality: N/A;  . ESOPHAGEAL DILATION N/A 08/17/2017   Procedure: ESOPHAGEAL DILATION;  Surgeon: Danie Binder, MD;  [facility]: [facility];  Service: Endoscopy;  Laterality: N/A;  . ESOPHAGEAL DILATION N/A 09/23/2017   Procedure: ESOPHAGEAL DILATION;  Surgeon: Danie Binder, MD;  [facility];  Service: Endoscopy;  Laterality: N/A;  . ESOPHAGEAL MANOMETRY N/A 01/10/2018   Procedure: ESOPHAGEAL MANOMETRY (EM);  Surgeon: Danie Binder, MD;  Location: [facility]; EG junction outflow obstruction. Not consistent with achalasia. Will need to exclude mechanical peptic stricture. Plans to hold off on EGD as he was tolerating soft diet and refer to [facility] for further evaluation.   . ESOPHAGOGASTRODUODENOSCOPY  02/11/2012   Dr. Gala Romney: portal gastropathy, gastric erosions, esophageal ulcerations likely pill-induced, surveillance in 2 years  . ESOPHAGOGASTRODUODENOSCOPY N/A 12/01/2014   Dr. Volney American esophageal stricture dilated with the scope passage, portal gastropathy, negative H.pylori on gastric biopsies, esophageal biopsies benign  . ESOPHAGOGASTRODUODENOSCOPY N/A 02/07/2015   Procedure: ESOPHAGOGASTRODUODENOSCOPY (EGD);  Surgeon: Daneil Dolin, MD;  [facility];  Service: Endoscopy;  Laterality: N/A;  1115  . ESOPHAGOGASTRODUODENOSCOPY N/A 08/17/2017   benign-appearing esophageal stenosis s/p dilation, mild gastritis, pylorus stenosis s/p dilation  . ESOPHAGOGASTRODUODENOSCOPY N/A 09/23/2017   moderate benign-appearing instrinsic stenosis s/p dilation, mild gastritis  . ESOPHAGOGASTRODUODENOSCOPY N/A 10/18/2017   Benign-appearing esophageal stricture s/p dilation, gastritis, benign-appearing intrinsice moderate pylorus stenosis s/p dilation  .  ESOPHAGOGASTRODUODENOSCOPY (EGD) WITH PROPOFOL N/A 12/23/2017   Dr. Gala Romney: single short Grade 2 varix mid esophagus, just above GE junction was nearly circumferential denuding ulcerations/loss of normal mucosal appearance (somewhat punched out) mucosa. Query pill induced injury with path negative for malignancy. Portal hypertensive gastropathy, patent pylorus, normal duodenal bulb and second portion of duodenum, no dilation performed.   Marland Kitchen EYE SURGERY     RIGHT EYE REMOVED  . JOINT REPLACEMENT    . LEFT HEART CATH AND CORONARY ANGIOGRAPHY N/A 05/01/2017   Procedure: Left Heart Cath and Coronary Angiography;  Surgeon: Leonie Man, MD;  Location: [facility];  Service: Cardiovascular;  Laterality: N/A;  . Venia Minks DILATION N/A 10/18/2017   Procedure: Venia Minks DILATION;  Surgeon: Danie Binder, MD;  [facility];  Service: Endoscopy;  Laterality: N/A;  . s/p eye implant  1997   artificial eye, right  . SAVORY DILATION N/A 10/18/2017   Procedure: SAVORY DILATION;  Surgeon: Danie Binder, MD;  [facility];  Service: Endoscopy;  Laterality: N/A;  . TOTAL HIP ARTHROPLASTY  2002  . TOTAL HIP ARTHROPLASTY  2006/2012   revision in 2012  . TOTAL KNEE ARTHROPLASTY  1999/2003   left/right     No Known Allergies    Family History  Problem Relation Age of Onset  . Ovarian cancer Sister   . Colon cancer Neg Hx      Social History Justin Parsons reports that he has never smoked. He has never used smokeless tobacco. Justin Parsons reports current alcohol use.   Review of Systems CONSTITUTIONAL: No weight loss, fever, chills, weakness or fatigue.  HEENT: Eyes: No visual loss, blurred vision, double vision or yellow sclerae.No hearing loss, sneezing, congestion, runny nose or sore throat.  SKIN: No rash or itching.  CARDIOVASCULAR: per hpi RESPIRATORY: No shortness of breath, cough or sputum.  GASTROINTESTINAL: No anorexia, nausea, vomiting or diarrhea. No  abdominal pain or blood.  GENITOURINARY: No burning on urination, no polyuria NEUROLOGICAL: No headache, dizziness, syncope, paralysis, ataxia, numbness or tingling in the extremities. No change in bowel or bladder control.  MUSCULOSKELETAL: No muscle, back pain, joint pain or stiffness.  LYMPHATICS: No enlarged nodes. No history of splenectomy.  PSYCHIATRIC: No history of depression or anxiety.  ENDOCRINOLOGIC: No reports of sweating, cold or heat intolerance. No polyuria or polydipsia.  Marland Kitchen   Physical Examination Today's Vitals   12/21/19 1536  BP: 91/64  Pulse: 61  Temp: 97.9 F (36.6 C)  SpO2: 98%  Height: 5' 9"  (1.753 m)   Body mass index is 28.78 kg/m.  Gen: resting comfortably, no acute distress HEENT: no scleral icterus, pupils equal round and reactive, no palptable cervical adenopathy,  CV: RRR, 2/6 systolic murmur apex, no jvd Resp: Clear to auscultation bilaterally GI: abdomen is soft, non-tender, non-distended, normal bowel sounds, no hepatosplenomegaly MSK: extremities are warm, no edema.  Skin: warm, no rash Neuro:  no focal deficits Psych: appropriate affect   Diagnostic Studies  10/2019 echo IMPRESSIONS    1. Left ventricular ejection fraction, by visual estimation, is 45 to  50%. The left ventricle has mildly decreased function. There is severely  increased left ventricular hypertrophy.  2. Mild hypokinesis of the left ventricular, basal-mid anterolateral wall  and inferolateral wall.  3. Left ventricular diastolic function could not be evaluated.  4. The left ventricle demonstrates regional wall motion abnormalities.  5. Global right ventricle has normal systolic function.The right  ventricular size is normal. No increase in right ventricular wall  thickness.  6. Left atrial size was moderately dilated.  7. Right atrial size was normal.  8. Mild calcification of the anterior mitral valve leaflet(s).  9. Mild thickening of the anterior  mitral valve leaflet(s).  10. The mitral valve is degenerative. Mild to moderate mitral valve  regurgitation.  11. The tricuspid valve is normal in structure.  12. Aortic valve regurgitation is mild to moderate.  13. The aortic valve is tricuspid. Aortic valve regurgitation is mild to  moderate. Mild to moderate aortic valve sclerosis/calcification without  any evidence of aortic stenosis.  14. The pulmonic valve was not well visualized. Pulmonic valve  regurgitation is not visualized.  15. Mildly elevated pulmonary artery systolic pressure.  16. The tricuspid regurgitant velocity is 2.89 m/s, and with an assumed  right atrial pressure of 3 mmHg, the estimated right ventricular systolic  pressure is mildly elevated at 36.4 mmHg.  17. There is a new lateral wall motion abnormality compared to 04/30/2017.  18. Prior images reviewed side by side.      Assessment and Plan  1. CAD - recent admission  with epgiastric pain constent with GI etiology, has resolved - continue current medical therapy  2. Afib - no symptoms, continue current meds. Has not been on anticoag as reported above   3. Valvular heart diseae - mild to mod AI and MR, continue to monitor    Arnoldo Lenis, M.D.

## 2019-12-21 NOTE — Patient Instructions (Signed)
Medication Instructions:  Your physician recommends that you continue on your current medications as directed. Please refer to the Current Medication list given to you today.   Labwork: NONE  Testing/Procedures: NONE  Follow-Up: Your physician recommends that you schedule a follow-up appointment in: 4 MONTHS    Any Other Special Instructions Will Be Listed Below (If Applicable).     If you need a refill on your cardiac medications before your next appointment, please call your pharmacy.   

## 2019-12-23 ENCOUNTER — Encounter: Payer: Self-pay | Admitting: Internal Medicine

## 2019-12-28 DIAGNOSIS — J189 Pneumonia, unspecified organism: Secondary | ICD-10-CM | POA: Diagnosis not present

## 2019-12-28 DIAGNOSIS — J9811 Atelectasis: Secondary | ICD-10-CM | POA: Diagnosis not present

## 2019-12-28 DIAGNOSIS — I4891 Unspecified atrial fibrillation: Secondary | ICD-10-CM | POA: Diagnosis not present

## 2019-12-28 DIAGNOSIS — R0600 Dyspnea, unspecified: Secondary | ICD-10-CM | POA: Diagnosis not present

## 2019-12-28 DIAGNOSIS — J90 Pleural effusion, not elsewhere classified: Secondary | ICD-10-CM | POA: Diagnosis not present

## 2019-12-28 DIAGNOSIS — R069 Unspecified abnormalities of breathing: Secondary | ICD-10-CM | POA: Diagnosis not present

## 2019-12-28 DIAGNOSIS — R0602 Shortness of breath: Secondary | ICD-10-CM | POA: Diagnosis not present

## 2019-12-28 DIAGNOSIS — I509 Heart failure, unspecified: Secondary | ICD-10-CM | POA: Diagnosis not present

## 2019-12-29 DIAGNOSIS — J90 Pleural effusion, not elsewhere classified: Secondary | ICD-10-CM | POA: Diagnosis not present

## 2019-12-29 DIAGNOSIS — I959 Hypotension, unspecified: Secondary | ICD-10-CM | POA: Diagnosis not present

## 2019-12-29 DIAGNOSIS — R0602 Shortness of breath: Secondary | ICD-10-CM | POA: Diagnosis not present

## 2019-12-29 DIAGNOSIS — I5033 Acute on chronic diastolic (congestive) heart failure: Secondary | ICD-10-CM | POA: Diagnosis not present

## 2019-12-29 DIAGNOSIS — R069 Unspecified abnormalities of breathing: Secondary | ICD-10-CM | POA: Diagnosis not present

## 2019-12-29 DIAGNOSIS — I509 Heart failure, unspecified: Secondary | ICD-10-CM | POA: Diagnosis not present

## 2019-12-29 DIAGNOSIS — I130 Hypertensive heart and chronic kidney disease with heart failure and stage 1 through stage 4 chronic kidney disease, or unspecified chronic kidney disease: Secondary | ICD-10-CM | POA: Diagnosis not present

## 2019-12-29 DIAGNOSIS — J189 Pneumonia, unspecified organism: Secondary | ICD-10-CM | POA: Diagnosis not present

## 2019-12-30 DIAGNOSIS — I129 Hypertensive chronic kidney disease with stage 1 through stage 4 chronic kidney disease, or unspecified chronic kidney disease: Secondary | ICD-10-CM | POA: Diagnosis not present

## 2019-12-30 DIAGNOSIS — I5033 Acute on chronic diastolic (congestive) heart failure: Secondary | ICD-10-CM | POA: Diagnosis not present

## 2019-12-30 DIAGNOSIS — N183 Chronic kidney disease, stage 3 unspecified: Secondary | ICD-10-CM | POA: Diagnosis not present

## 2019-12-30 DIAGNOSIS — I2510 Atherosclerotic heart disease of native coronary artery without angina pectoris: Secondary | ICD-10-CM | POA: Diagnosis not present

## 2019-12-30 DIAGNOSIS — I130 Hypertensive heart and chronic kidney disease with heart failure and stage 1 through stage 4 chronic kidney disease, or unspecified chronic kidney disease: Secondary | ICD-10-CM | POA: Diagnosis not present

## 2019-12-30 DIAGNOSIS — K7030 Alcoholic cirrhosis of liver without ascites: Secondary | ICD-10-CM | POA: Diagnosis not present

## 2019-12-30 DIAGNOSIS — I509 Heart failure, unspecified: Secondary | ICD-10-CM | POA: Diagnosis not present

## 2019-12-30 DIAGNOSIS — J189 Pneumonia, unspecified organism: Secondary | ICD-10-CM | POA: Diagnosis not present

## 2019-12-31 DIAGNOSIS — J189 Pneumonia, unspecified organism: Secondary | ICD-10-CM | POA: Diagnosis not present

## 2019-12-31 DIAGNOSIS — I10 Essential (primary) hypertension: Secondary | ICD-10-CM | POA: Diagnosis not present

## 2019-12-31 DIAGNOSIS — I5033 Acute on chronic diastolic (congestive) heart failure: Secondary | ICD-10-CM | POA: Diagnosis not present

## 2019-12-31 DIAGNOSIS — N183 Chronic kidney disease, stage 3 unspecified: Secondary | ICD-10-CM | POA: Diagnosis not present

## 2019-12-31 DIAGNOSIS — I2510 Atherosclerotic heart disease of native coronary artery without angina pectoris: Secondary | ICD-10-CM | POA: Diagnosis not present

## 2019-12-31 DIAGNOSIS — K7030 Alcoholic cirrhosis of liver without ascites: Secondary | ICD-10-CM | POA: Diagnosis not present

## 2019-12-31 DIAGNOSIS — I130 Hypertensive heart and chronic kidney disease with heart failure and stage 1 through stage 4 chronic kidney disease, or unspecified chronic kidney disease: Secondary | ICD-10-CM | POA: Diagnosis not present

## 2019-12-31 DIAGNOSIS — I129 Hypertensive chronic kidney disease with stage 1 through stage 4 chronic kidney disease, or unspecified chronic kidney disease: Secondary | ICD-10-CM | POA: Diagnosis not present

## 2019-12-31 DIAGNOSIS — I509 Heart failure, unspecified: Secondary | ICD-10-CM | POA: Diagnosis not present

## 2019-12-31 DIAGNOSIS — I482 Chronic atrial fibrillation, unspecified: Secondary | ICD-10-CM | POA: Diagnosis not present

## 2020-01-19 DIAGNOSIS — I2510 Atherosclerotic heart disease of native coronary artery without angina pectoris: Secondary | ICD-10-CM | POA: Diagnosis not present

## 2020-01-19 DIAGNOSIS — I5033 Acute on chronic diastolic (congestive) heart failure: Secondary | ICD-10-CM | POA: Diagnosis not present

## 2020-01-19 DIAGNOSIS — I130 Hypertensive heart and chronic kidney disease with heart failure and stage 1 through stage 4 chronic kidney disease, or unspecified chronic kidney disease: Secondary | ICD-10-CM | POA: Diagnosis not present

## 2020-01-19 DIAGNOSIS — N184 Chronic kidney disease, stage 4 (severe): Secondary | ICD-10-CM | POA: Diagnosis not present

## 2020-01-24 DIAGNOSIS — Z8673 Personal history of transient ischemic attack (TIA), and cerebral infarction without residual deficits: Secondary | ICD-10-CM | POA: Diagnosis not present

## 2020-01-24 DIAGNOSIS — Z66 Do not resuscitate: Secondary | ICD-10-CM | POA: Diagnosis present

## 2020-01-24 DIAGNOSIS — A419 Sepsis, unspecified organism: Secondary | ICD-10-CM | POA: Diagnosis present

## 2020-01-24 DIAGNOSIS — R0902 Hypoxemia: Secondary | ICD-10-CM | POA: Diagnosis not present

## 2020-01-24 DIAGNOSIS — R0689 Other abnormalities of breathing: Secondary | ICD-10-CM | POA: Diagnosis not present

## 2020-01-24 DIAGNOSIS — I959 Hypotension, unspecified: Secondary | ICD-10-CM | POA: Diagnosis not present

## 2020-01-24 DIAGNOSIS — M109 Gout, unspecified: Secondary | ICD-10-CM | POA: Diagnosis present

## 2020-01-24 DIAGNOSIS — N189 Chronic kidney disease, unspecified: Secondary | ICD-10-CM | POA: Diagnosis not present

## 2020-01-24 DIAGNOSIS — K7460 Unspecified cirrhosis of liver: Secondary | ICD-10-CM | POA: Diagnosis present

## 2020-01-24 DIAGNOSIS — N178 Other acute kidney failure: Secondary | ICD-10-CM | POA: Diagnosis not present

## 2020-01-24 DIAGNOSIS — R778 Other specified abnormalities of plasma proteins: Secondary | ICD-10-CM | POA: Diagnosis not present

## 2020-01-24 DIAGNOSIS — I5032 Chronic diastolic (congestive) heart failure: Secondary | ICD-10-CM | POA: Diagnosis not present

## 2020-01-24 DIAGNOSIS — Z7982 Long term (current) use of aspirin: Secondary | ICD-10-CM | POA: Diagnosis not present

## 2020-01-24 DIAGNOSIS — R4182 Altered mental status, unspecified: Secondary | ICD-10-CM | POA: Diagnosis not present

## 2020-01-24 DIAGNOSIS — R6521 Severe sepsis with septic shock: Secondary | ICD-10-CM | POA: Diagnosis present

## 2020-01-24 DIAGNOSIS — I5033 Acute on chronic diastolic (congestive) heart failure: Secondary | ICD-10-CM | POA: Diagnosis not present

## 2020-01-24 DIAGNOSIS — I4821 Permanent atrial fibrillation: Secondary | ICD-10-CM | POA: Diagnosis present

## 2020-01-24 DIAGNOSIS — I4891 Unspecified atrial fibrillation: Secondary | ICD-10-CM | POA: Diagnosis not present

## 2020-01-24 DIAGNOSIS — I130 Hypertensive heart and chronic kidney disease with heart failure and stage 1 through stage 4 chronic kidney disease, or unspecified chronic kidney disease: Secondary | ICD-10-CM | POA: Diagnosis not present

## 2020-01-24 DIAGNOSIS — R404 Transient alteration of awareness: Secondary | ICD-10-CM | POA: Diagnosis not present

## 2020-01-24 DIAGNOSIS — I509 Heart failure, unspecified: Secondary | ICD-10-CM | POA: Diagnosis not present

## 2020-01-24 DIAGNOSIS — Z6829 Body mass index (BMI) 29.0-29.9, adult: Secondary | ICD-10-CM | POA: Diagnosis not present

## 2020-01-24 DIAGNOSIS — K219 Gastro-esophageal reflux disease without esophagitis: Secondary | ICD-10-CM | POA: Diagnosis present

## 2020-01-24 DIAGNOSIS — Z20822 Contact with and (suspected) exposure to covid-19: Secondary | ICD-10-CM | POA: Diagnosis not present

## 2020-01-24 DIAGNOSIS — N184 Chronic kidney disease, stage 4 (severe): Secondary | ICD-10-CM | POA: Diagnosis present

## 2020-01-24 DIAGNOSIS — I517 Cardiomegaly: Secondary | ICD-10-CM | POA: Diagnosis not present

## 2020-01-24 DIAGNOSIS — I110 Hypertensive heart disease with heart failure: Secondary | ICD-10-CM | POA: Diagnosis present

## 2020-01-24 DIAGNOSIS — N179 Acute kidney failure, unspecified: Secondary | ICD-10-CM | POA: Diagnosis not present

## 2020-01-24 DIAGNOSIS — I499 Cardiac arrhythmia, unspecified: Secondary | ICD-10-CM | POA: Diagnosis not present

## 2020-01-24 DIAGNOSIS — J189 Pneumonia, unspecified organism: Secondary | ICD-10-CM | POA: Diagnosis not present

## 2020-01-24 DIAGNOSIS — E039 Hypothyroidism, unspecified: Secondary | ICD-10-CM | POA: Diagnosis present

## 2020-01-24 DIAGNOSIS — R410 Disorientation, unspecified: Secondary | ICD-10-CM | POA: Diagnosis not present

## 2020-01-24 DIAGNOSIS — Z452 Encounter for adjustment and management of vascular access device: Secondary | ICD-10-CM | POA: Diagnosis not present

## 2020-02-18 DEATH — deceased

## 2020-03-16 ENCOUNTER — Ambulatory Visit: Payer: Medicare Other | Admitting: Gastroenterology

## 2020-05-09 ENCOUNTER — Ambulatory Visit: Payer: Medicare Other | Admitting: Cardiology
